# Patient Record
Sex: Male | Born: 1955 | Race: Black or African American | Hispanic: No | Marital: Single | Smoking: Current every day smoker
Health system: Southern US, Community
[De-identification: ages and names within clinical notes are randomized; demographics above are authoritative.]

## PROBLEM LIST (undated history)

## (undated) DIAGNOSIS — B182 Chronic viral hepatitis C: Secondary | ICD-10-CM

## (undated) DIAGNOSIS — F32A Depression, unspecified: Secondary | ICD-10-CM

## (undated) DIAGNOSIS — M86149 Other acute osteomyelitis, unspecified hand: Secondary | ICD-10-CM

## (undated) DIAGNOSIS — F1910 Other psychoactive substance abuse, uncomplicated: Secondary | ICD-10-CM

## (undated) DIAGNOSIS — B20 Human immunodeficiency virus [HIV] disease: Secondary | ICD-10-CM

## (undated) DIAGNOSIS — M5430 Sciatica, unspecified side: Secondary | ICD-10-CM

## (undated) DIAGNOSIS — Z653 Problems related to other legal circumstances: Secondary | ICD-10-CM

## (undated) DIAGNOSIS — F329 Major depressive disorder, single episode, unspecified: Secondary | ICD-10-CM

## (undated) DIAGNOSIS — W3400XA Accidental discharge from unspecified firearms or gun, initial encounter: Secondary | ICD-10-CM

## (undated) DIAGNOSIS — F339 Major depressive disorder, recurrent, unspecified: Secondary | ICD-10-CM

## (undated) DIAGNOSIS — N19 Unspecified kidney failure: Secondary | ICD-10-CM

## (undated) DIAGNOSIS — J449 Chronic obstructive pulmonary disease, unspecified: Secondary | ICD-10-CM

## (undated) DIAGNOSIS — R7881 Bacteremia: Secondary | ICD-10-CM

## (undated) DIAGNOSIS — F319 Bipolar disorder, unspecified: Secondary | ICD-10-CM

## (undated) DIAGNOSIS — B9562 Methicillin resistant Staphylococcus aureus infection as the cause of diseases classified elsewhere: Secondary | ICD-10-CM

## (undated) DIAGNOSIS — B1920 Unspecified viral hepatitis C without hepatic coma: Secondary | ICD-10-CM

## (undated) DIAGNOSIS — F1490 Cocaine use, unspecified, uncomplicated: Secondary | ICD-10-CM

## (undated) DIAGNOSIS — S31139A Puncture wound of abdominal wall without foreign body, unspecified quadrant without penetration into peritoneal cavity, initial encounter: Secondary | ICD-10-CM

## (undated) DIAGNOSIS — Z21 Asymptomatic human immunodeficiency virus [HIV] infection status: Secondary | ICD-10-CM

## (undated) HISTORY — DX: Unspecified kidney failure: N19

## (undated) HISTORY — DX: Bacteremia: R78.81

## (undated) HISTORY — DX: Other acute osteomyelitis, unspecified hand: M86.149

## (undated) HISTORY — DX: Methicillin resistant Staphylococcus aureus infection as the cause of diseases classified elsewhere: B95.62

## (undated) HISTORY — DX: Chronic viral hepatitis C: B18.2

## (undated) HISTORY — PX: GUNDERSON CONJUCTIVAL FLAP: SHX1717

## (undated) HISTORY — DX: Major depressive disorder, recurrent, unspecified: F33.9

## (undated) HISTORY — DX: Other psychoactive substance abuse, uncomplicated: F19.10

## (undated) HISTORY — DX: Problems related to other legal circumstances: Z65.3

## (undated) HISTORY — DX: Sciatica, unspecified side: M54.30

## (undated) HISTORY — DX: Human immunodeficiency virus (HIV) disease: B20

## (undated) HISTORY — DX: Accidental discharge from unspecified firearms or gun, initial encounter: W34.00XA

## (undated) HISTORY — DX: Puncture wound of abdominal wall without foreign body, unspecified quadrant without penetration into peritoneal cavity, initial encounter: S31.139A

## (undated) HISTORY — DX: Chronic obstructive pulmonary disease, unspecified: J44.9

---

## 1998-01-10 ENCOUNTER — Emergency Department (HOSPITAL_COMMUNITY): Admission: EM | Admit: 1998-01-10 | Discharge: 1998-01-10 | Payer: Self-pay | Admitting: Emergency Medicine

## 1998-09-23 ENCOUNTER — Emergency Department (HOSPITAL_COMMUNITY): Admission: EM | Admit: 1998-09-23 | Discharge: 1998-09-23 | Payer: Self-pay | Admitting: Emergency Medicine

## 1998-09-23 ENCOUNTER — Encounter: Payer: Self-pay | Admitting: Emergency Medicine

## 1998-09-25 ENCOUNTER — Emergency Department (HOSPITAL_COMMUNITY): Admission: EM | Admit: 1998-09-25 | Discharge: 1998-09-25 | Payer: Self-pay | Admitting: Emergency Medicine

## 1998-09-28 ENCOUNTER — Emergency Department (HOSPITAL_COMMUNITY): Admission: EM | Admit: 1998-09-28 | Discharge: 1998-09-28 | Payer: Self-pay | Admitting: Emergency Medicine

## 1998-12-23 ENCOUNTER — Encounter: Admission: RE | Admit: 1998-12-23 | Discharge: 1998-12-23 | Payer: Self-pay | Admitting: Hematology and Oncology

## 1998-12-23 ENCOUNTER — Ambulatory Visit (HOSPITAL_COMMUNITY): Admission: RE | Admit: 1998-12-23 | Discharge: 1998-12-23 | Payer: Self-pay | Admitting: Hematology and Oncology

## 1999-03-19 ENCOUNTER — Emergency Department (HOSPITAL_COMMUNITY): Admission: EM | Admit: 1999-03-19 | Discharge: 1999-03-19 | Payer: Self-pay | Admitting: Emergency Medicine

## 1999-06-18 ENCOUNTER — Encounter: Admission: RE | Admit: 1999-06-18 | Discharge: 1999-06-18 | Payer: Self-pay | Admitting: General Surgery

## 1999-06-18 ENCOUNTER — Encounter: Payer: Self-pay | Admitting: General Surgery

## 1999-06-21 ENCOUNTER — Ambulatory Visit (HOSPITAL_BASED_OUTPATIENT_CLINIC_OR_DEPARTMENT_OTHER): Admission: RE | Admit: 1999-06-21 | Discharge: 1999-06-22 | Payer: Self-pay | Admitting: General Surgery

## 1999-09-22 ENCOUNTER — Encounter: Admission: RE | Admit: 1999-09-22 | Discharge: 1999-09-22 | Payer: Self-pay | Admitting: Internal Medicine

## 1999-09-22 ENCOUNTER — Ambulatory Visit (HOSPITAL_COMMUNITY): Admission: RE | Admit: 1999-09-22 | Discharge: 1999-09-22 | Payer: Self-pay | Admitting: Internal Medicine

## 1999-10-03 ENCOUNTER — Emergency Department (HOSPITAL_COMMUNITY): Admission: EM | Admit: 1999-10-03 | Discharge: 1999-10-03 | Payer: Self-pay | Admitting: Emergency Medicine

## 2000-02-15 ENCOUNTER — Ambulatory Visit (HOSPITAL_COMMUNITY): Admission: RE | Admit: 2000-02-15 | Discharge: 2000-02-15 | Payer: Self-pay | Admitting: Hematology and Oncology

## 2000-02-15 ENCOUNTER — Encounter: Admission: RE | Admit: 2000-02-15 | Discharge: 2000-02-15 | Payer: Self-pay | Admitting: Hematology and Oncology

## 2000-03-17 ENCOUNTER — Emergency Department (HOSPITAL_COMMUNITY): Admission: EM | Admit: 2000-03-17 | Discharge: 2000-03-17 | Payer: Self-pay | Admitting: Emergency Medicine

## 2000-03-17 ENCOUNTER — Encounter: Payer: Self-pay | Admitting: Emergency Medicine

## 2000-03-27 ENCOUNTER — Encounter: Payer: Self-pay | Admitting: Emergency Medicine

## 2000-03-27 ENCOUNTER — Inpatient Hospital Stay (HOSPITAL_COMMUNITY): Admission: EM | Admit: 2000-03-27 | Discharge: 2000-04-03 | Payer: Self-pay | Admitting: Emergency Medicine

## 2000-03-27 ENCOUNTER — Encounter: Payer: Self-pay | Admitting: Orthopedic Surgery

## 2000-04-04 ENCOUNTER — Encounter (HOSPITAL_COMMUNITY): Admission: RE | Admit: 2000-04-04 | Discharge: 2000-07-03 | Payer: Self-pay | Admitting: Orthopedic Surgery

## 2000-04-17 ENCOUNTER — Emergency Department (HOSPITAL_COMMUNITY): Admission: EM | Admit: 2000-04-17 | Discharge: 2000-04-17 | Payer: Self-pay

## 2000-05-30 ENCOUNTER — Emergency Department (HOSPITAL_COMMUNITY): Admission: EM | Admit: 2000-05-30 | Discharge: 2000-05-30 | Payer: Self-pay | Admitting: Emergency Medicine

## 2000-05-30 ENCOUNTER — Inpatient Hospital Stay (HOSPITAL_COMMUNITY): Admission: EM | Admit: 2000-05-30 | Discharge: 2000-06-07 | Payer: Self-pay | Admitting: Psychiatry

## 2000-06-21 ENCOUNTER — Encounter: Admission: RE | Admit: 2000-06-21 | Discharge: 2000-06-21 | Payer: Self-pay | Admitting: Hematology and Oncology

## 2000-06-21 ENCOUNTER — Ambulatory Visit (HOSPITAL_COMMUNITY): Admission: RE | Admit: 2000-06-21 | Discharge: 2000-06-21 | Payer: Self-pay | Admitting: *Deleted

## 2000-06-21 ENCOUNTER — Encounter: Payer: Self-pay | Admitting: Internal Medicine

## 2000-06-22 ENCOUNTER — Ambulatory Visit (HOSPITAL_COMMUNITY): Admission: RE | Admit: 2000-06-22 | Discharge: 2000-06-22 | Payer: Self-pay | Admitting: Hematology and Oncology

## 2000-06-29 ENCOUNTER — Emergency Department (HOSPITAL_COMMUNITY): Admission: EM | Admit: 2000-06-29 | Discharge: 2000-06-30 | Payer: Self-pay | Admitting: Emergency Medicine

## 2000-07-11 ENCOUNTER — Emergency Department (HOSPITAL_COMMUNITY): Admission: EM | Admit: 2000-07-11 | Discharge: 2000-07-11 | Payer: Self-pay

## 2000-08-14 ENCOUNTER — Emergency Department (HOSPITAL_COMMUNITY): Admission: EM | Admit: 2000-08-14 | Discharge: 2000-08-14 | Payer: Self-pay | Admitting: Emergency Medicine

## 2000-08-15 ENCOUNTER — Encounter: Payer: Self-pay | Admitting: Emergency Medicine

## 2000-10-16 ENCOUNTER — Encounter: Admission: RE | Admit: 2000-10-16 | Discharge: 2000-10-16 | Payer: Self-pay | Admitting: Internal Medicine

## 2000-10-19 ENCOUNTER — Ambulatory Visit (HOSPITAL_COMMUNITY): Admission: RE | Admit: 2000-10-19 | Discharge: 2000-10-19 | Payer: Self-pay | Admitting: Internal Medicine

## 2000-10-19 ENCOUNTER — Encounter: Admission: RE | Admit: 2000-10-19 | Discharge: 2000-10-19 | Payer: Self-pay | Admitting: Internal Medicine

## 2000-11-02 ENCOUNTER — Encounter: Admission: RE | Admit: 2000-11-02 | Discharge: 2000-11-02 | Payer: Self-pay | Admitting: Internal Medicine

## 2000-11-29 ENCOUNTER — Encounter: Admission: RE | Admit: 2000-11-29 | Discharge: 2000-11-29 | Payer: Self-pay | Admitting: Internal Medicine

## 2001-01-02 ENCOUNTER — Encounter: Admission: RE | Admit: 2001-01-02 | Discharge: 2001-01-02 | Payer: Self-pay | Admitting: Internal Medicine

## 2001-01-02 ENCOUNTER — Ambulatory Visit (HOSPITAL_COMMUNITY): Admission: RE | Admit: 2001-01-02 | Discharge: 2001-01-02 | Payer: Self-pay | Admitting: Internal Medicine

## 2001-01-15 ENCOUNTER — Encounter: Admission: RE | Admit: 2001-01-15 | Discharge: 2001-01-15 | Payer: Self-pay | Admitting: Internal Medicine

## 2001-01-17 ENCOUNTER — Ambulatory Visit (HOSPITAL_COMMUNITY): Admission: RE | Admit: 2001-01-17 | Discharge: 2001-01-17 | Payer: Self-pay | Admitting: Internal Medicine

## 2001-01-17 ENCOUNTER — Encounter: Admission: RE | Admit: 2001-01-17 | Discharge: 2001-01-17 | Payer: Self-pay | Admitting: Internal Medicine

## 2001-02-11 ENCOUNTER — Emergency Department (HOSPITAL_COMMUNITY): Admission: EM | Admit: 2001-02-11 | Discharge: 2001-02-11 | Payer: Self-pay | Admitting: *Deleted

## 2001-02-11 ENCOUNTER — Encounter: Payer: Self-pay | Admitting: Emergency Medicine

## 2001-02-25 ENCOUNTER — Emergency Department (HOSPITAL_COMMUNITY): Admission: EM | Admit: 2001-02-25 | Discharge: 2001-02-25 | Payer: Self-pay | Admitting: Emergency Medicine

## 2001-03-12 ENCOUNTER — Ambulatory Visit (HOSPITAL_COMMUNITY): Admission: RE | Admit: 2001-03-12 | Discharge: 2001-03-12 | Payer: Self-pay

## 2001-03-12 ENCOUNTER — Encounter: Admission: RE | Admit: 2001-03-12 | Discharge: 2001-03-12 | Payer: Self-pay

## 2001-03-12 ENCOUNTER — Emergency Department (HOSPITAL_COMMUNITY): Admission: EM | Admit: 2001-03-12 | Discharge: 2001-03-12 | Payer: Self-pay | Admitting: Emergency Medicine

## 2001-04-05 ENCOUNTER — Encounter: Admission: RE | Admit: 2001-04-05 | Discharge: 2001-04-05 | Payer: Self-pay | Admitting: Internal Medicine

## 2001-07-12 ENCOUNTER — Inpatient Hospital Stay (HOSPITAL_COMMUNITY): Admission: EM | Admit: 2001-07-12 | Discharge: 2001-07-26 | Payer: Self-pay | Admitting: Psychiatry

## 2001-07-21 ENCOUNTER — Encounter: Payer: Self-pay | Admitting: Emergency Medicine

## 2001-07-21 ENCOUNTER — Emergency Department (HOSPITAL_COMMUNITY): Admission: RE | Admit: 2001-07-21 | Discharge: 2001-07-21 | Payer: Self-pay | Admitting: Psychiatry

## 2001-07-31 ENCOUNTER — Emergency Department (HOSPITAL_COMMUNITY): Admission: EM | Admit: 2001-07-31 | Discharge: 2001-07-31 | Payer: Self-pay | Admitting: Emergency Medicine

## 2001-08-21 ENCOUNTER — Emergency Department (HOSPITAL_COMMUNITY): Admission: EM | Admit: 2001-08-21 | Discharge: 2001-08-21 | Payer: Self-pay | Admitting: Emergency Medicine

## 2001-08-24 ENCOUNTER — Emergency Department (HOSPITAL_COMMUNITY): Admission: EM | Admit: 2001-08-24 | Discharge: 2001-08-24 | Payer: Self-pay | Admitting: Emergency Medicine

## 2001-08-24 ENCOUNTER — Encounter: Payer: Self-pay | Admitting: Emergency Medicine

## 2001-09-03 ENCOUNTER — Emergency Department (HOSPITAL_COMMUNITY): Admission: EM | Admit: 2001-09-03 | Discharge: 2001-09-03 | Payer: Self-pay

## 2001-09-08 ENCOUNTER — Emergency Department (HOSPITAL_COMMUNITY): Admission: EM | Admit: 2001-09-08 | Discharge: 2001-09-08 | Payer: Self-pay | Admitting: Emergency Medicine

## 2001-09-25 ENCOUNTER — Inpatient Hospital Stay (HOSPITAL_COMMUNITY): Admission: EM | Admit: 2001-09-25 | Discharge: 2001-09-28 | Payer: Self-pay | Admitting: Emergency Medicine

## 2001-10-16 ENCOUNTER — Encounter: Admission: RE | Admit: 2001-10-16 | Discharge: 2001-10-16 | Payer: Self-pay | Admitting: Internal Medicine

## 2001-10-16 ENCOUNTER — Encounter: Payer: Self-pay | Admitting: Internal Medicine

## 2001-10-16 ENCOUNTER — Ambulatory Visit (HOSPITAL_COMMUNITY): Admission: RE | Admit: 2001-10-16 | Discharge: 2001-10-16 | Payer: Self-pay | Admitting: Internal Medicine

## 2002-01-05 ENCOUNTER — Emergency Department (HOSPITAL_COMMUNITY): Admission: EM | Admit: 2002-01-05 | Discharge: 2002-01-05 | Payer: Self-pay | Admitting: *Deleted

## 2002-07-22 ENCOUNTER — Encounter: Payer: Self-pay | Admitting: Internal Medicine

## 2002-07-22 LAB — CONVERTED CEMR LAB: Hep A Total Ab: POSITIVE

## 2002-08-06 ENCOUNTER — Encounter: Payer: Self-pay | Admitting: Emergency Medicine

## 2002-08-06 ENCOUNTER — Emergency Department (HOSPITAL_COMMUNITY): Admission: EM | Admit: 2002-08-06 | Discharge: 2002-08-06 | Payer: Self-pay | Admitting: Emergency Medicine

## 2002-08-13 ENCOUNTER — Encounter: Admission: RE | Admit: 2002-08-13 | Discharge: 2002-08-13 | Payer: Self-pay | Admitting: Internal Medicine

## 2002-08-13 ENCOUNTER — Encounter: Payer: Self-pay | Admitting: Internal Medicine

## 2002-10-10 ENCOUNTER — Emergency Department (HOSPITAL_COMMUNITY): Admission: EM | Admit: 2002-10-10 | Discharge: 2002-10-10 | Payer: Self-pay | Admitting: Emergency Medicine

## 2003-08-23 ENCOUNTER — Emergency Department (HOSPITAL_COMMUNITY): Admission: EM | Admit: 2003-08-23 | Discharge: 2003-08-23 | Payer: Self-pay | Admitting: Emergency Medicine

## 2003-08-26 ENCOUNTER — Encounter: Admission: RE | Admit: 2003-08-26 | Discharge: 2003-08-26 | Payer: Self-pay | Admitting: Internal Medicine

## 2003-12-02 ENCOUNTER — Emergency Department (HOSPITAL_COMMUNITY): Admission: EM | Admit: 2003-12-02 | Discharge: 2003-12-02 | Payer: Self-pay | Admitting: Emergency Medicine

## 2004-06-26 ENCOUNTER — Emergency Department (HOSPITAL_COMMUNITY): Admission: EM | Admit: 2004-06-26 | Discharge: 2004-06-26 | Payer: Self-pay | Admitting: Emergency Medicine

## 2004-09-17 ENCOUNTER — Emergency Department (HOSPITAL_COMMUNITY): Admission: EM | Admit: 2004-09-17 | Discharge: 2004-09-18 | Payer: Self-pay | Admitting: Emergency Medicine

## 2005-01-04 ENCOUNTER — Encounter: Payer: Self-pay | Admitting: Internal Medicine

## 2005-01-04 ENCOUNTER — Ambulatory Visit (HOSPITAL_COMMUNITY): Admission: RE | Admit: 2005-01-04 | Discharge: 2005-01-04 | Payer: Self-pay | Admitting: Internal Medicine

## 2005-01-04 ENCOUNTER — Ambulatory Visit: Payer: Self-pay | Admitting: Internal Medicine

## 2006-05-02 ENCOUNTER — Emergency Department (HOSPITAL_COMMUNITY): Admission: EM | Admit: 2006-05-02 | Discharge: 2006-05-02 | Payer: Self-pay | Admitting: Emergency Medicine

## 2006-05-30 DIAGNOSIS — F319 Bipolar disorder, unspecified: Secondary | ICD-10-CM | POA: Insufficient documentation

## 2006-05-30 DIAGNOSIS — F329 Major depressive disorder, single episode, unspecified: Secondary | ICD-10-CM | POA: Insufficient documentation

## 2006-05-30 DIAGNOSIS — J45909 Unspecified asthma, uncomplicated: Secondary | ICD-10-CM | POA: Insufficient documentation

## 2006-05-30 DIAGNOSIS — J438 Other emphysema: Secondary | ICD-10-CM | POA: Insufficient documentation

## 2006-05-30 DIAGNOSIS — G44209 Tension-type headache, unspecified, not intractable: Secondary | ICD-10-CM | POA: Insufficient documentation

## 2006-05-30 DIAGNOSIS — D75A Glucose-6-phosphate dehydrogenase (G6PD) deficiency without anemia: Secondary | ICD-10-CM | POA: Insufficient documentation

## 2006-05-30 DIAGNOSIS — K625 Hemorrhage of anus and rectum: Secondary | ICD-10-CM | POA: Insufficient documentation

## 2006-05-30 DIAGNOSIS — F1420 Cocaine dependence, uncomplicated: Secondary | ICD-10-CM | POA: Insufficient documentation

## 2006-05-30 DIAGNOSIS — R079 Chest pain, unspecified: Secondary | ICD-10-CM | POA: Insufficient documentation

## 2006-05-30 DIAGNOSIS — B20 Human immunodeficiency virus [HIV] disease: Secondary | ICD-10-CM | POA: Insufficient documentation

## 2006-05-30 DIAGNOSIS — D649 Anemia, unspecified: Secondary | ICD-10-CM | POA: Insufficient documentation

## 2006-05-30 DIAGNOSIS — F1010 Alcohol abuse, uncomplicated: Secondary | ICD-10-CM | POA: Insufficient documentation

## 2006-05-30 DIAGNOSIS — I10 Essential (primary) hypertension: Secondary | ICD-10-CM | POA: Insufficient documentation

## 2006-05-30 DIAGNOSIS — B182 Chronic viral hepatitis C: Secondary | ICD-10-CM | POA: Insufficient documentation

## 2006-05-30 DIAGNOSIS — F17200 Nicotine dependence, unspecified, uncomplicated: Secondary | ICD-10-CM | POA: Insufficient documentation

## 2006-05-31 ENCOUNTER — Encounter (INDEPENDENT_AMBULATORY_CARE_PROVIDER_SITE_OTHER): Payer: Self-pay | Admitting: *Deleted

## 2006-05-31 ENCOUNTER — Ambulatory Visit: Payer: Self-pay | Admitting: Hospitalist

## 2006-05-31 ENCOUNTER — Encounter (INDEPENDENT_AMBULATORY_CARE_PROVIDER_SITE_OTHER): Payer: Self-pay | Admitting: Internal Medicine

## 2006-05-31 ENCOUNTER — Encounter: Payer: Self-pay | Admitting: Internal Medicine

## 2006-05-31 ENCOUNTER — Encounter: Admission: RE | Admit: 2006-05-31 | Discharge: 2006-05-31 | Payer: Self-pay | Admitting: Hospitalist

## 2006-05-31 LAB — CONVERTED CEMR LAB
ALT: 16 units/L (ref 0–53)
AST: 28 units/L (ref 0–37)
Albumin: 3.6 g/dL (ref 3.5–5.2)
Alkaline Phosphatase: 58 units/L (ref 39–117)
BUN: 11 mg/dL (ref 6–23)
Basophils Absolute: 0 10*3/uL (ref 0.0–0.1)
Basophils Relative: 1 % (ref 0–1)
CD4 Count: 9 microliters
CD4 T Cell Abs: 9
CO2: 27 meq/L (ref 19–32)
Calcium: 8.6 mg/dL (ref 8.4–10.5)
Chloride: 106 meq/L (ref 96–112)
Creatinine, Ser: 0.92 mg/dL (ref 0.40–1.50)
Eosinophils Relative: 11 % — ABNORMAL HIGH (ref 0–5)
Glucose, Bld: 83 mg/dL (ref 70–99)
HCT: 43.4 % (ref 39.0–52.0)
HIV 1 RNA Quant: 1150000 copies/mL
HIV 1 RNA Quant: 1150000 copies/mL — ABNORMAL HIGH (ref <50)
HIV-1 RNA Quant, Log: 6.06 — ABNORMAL HIGH (ref <1.70)
Hemoglobin: 13.7 g/dL (ref 13.0–17.0)
Lymphocytes Relative: 29 % (ref 12–46)
Lymphs Abs: 0.8 10*3/uL (ref 0.7–3.3)
MCHC: 31.6 g/dL (ref 30.0–36.0)
MCV: 92.5 fL (ref 78.0–100.0)
Monocytes Absolute: 0.3 10*3/uL (ref 0.2–0.7)
Monocytes Relative: 10 % (ref 3–11)
Neutro Abs: 1.3 10*3/uL — ABNORMAL LOW (ref 1.7–7.7)
Neutrophils Relative %: 50 % (ref 43–77)
Platelets: 117 10*3/uL — ABNORMAL LOW (ref 150–400)
Potassium: 3.5 meq/L (ref 3.5–5.3)
RBC: 4.69 M/uL (ref 4.22–5.81)
RDW: 13.2 % (ref 11.5–14.0)
Sodium: 142 meq/L (ref 135–145)
Total Bilirubin: 0.6 mg/dL (ref 0.3–1.2)
Total Protein: 9 g/dL — ABNORMAL HIGH (ref 6.0–8.3)
WBC: 2.6 10*3/uL — ABNORMAL LOW (ref 4.0–10.5)

## 2006-06-13 ENCOUNTER — Ambulatory Visit: Payer: Self-pay | Admitting: Internal Medicine

## 2006-06-13 ENCOUNTER — Encounter: Payer: Self-pay | Admitting: Internal Medicine

## 2006-06-13 LAB — CONVERTED CEMR LAB
Chlamydia, Swab/Urine, PCR: NEGATIVE
Cholesterol: 107 mg/dL (ref 0–200)
GC Probe Amp, Urine: NEGATIVE
HCV Ab: POSITIVE — AB
HDL: 24 mg/dL — ABNORMAL LOW (ref >39)
Hemoglobin, Urine: NEGATIVE
Hep B Core Total Ab: NEGATIVE
Hep B S Ab: NEGATIVE
Hepatitis B Surface Ag: NEGATIVE
LDL Cholesterol: 61 mg/dL (ref 0–99)
Leukocytes, UA: NEGATIVE
Nitrite: NEGATIVE
Protein, ur: 30 mg/dL — AB
RBC / HPF: NONE SEEN (ref <3)
Specific Gravity, Urine: 1.031 (ref 1.005–1.03)
Total CHOL/HDL Ratio: 4.5
Triglycerides: 110 mg/dL (ref <150)
Urine Glucose: NEGATIVE mg/dL
Urobilinogen, UA: 1 (ref 0.0–1.0)
VLDL: 22 mg/dL (ref 0–40)
WBC, UA: NONE SEEN cells/hpf (ref <3)
pH: 6 (ref 5.0–8.0)

## 2006-06-21 ENCOUNTER — Ambulatory Visit: Payer: Self-pay | Admitting: Internal Medicine

## 2006-07-17 ENCOUNTER — Encounter (INDEPENDENT_AMBULATORY_CARE_PROVIDER_SITE_OTHER): Payer: Self-pay | Admitting: *Deleted

## 2006-07-17 LAB — CONVERTED CEMR LAB
CD4 Count: 0 microliters
HIV 1 RNA Quant: 0 copies/mL

## 2006-07-30 ENCOUNTER — Encounter (INDEPENDENT_AMBULATORY_CARE_PROVIDER_SITE_OTHER): Payer: Self-pay | Admitting: *Deleted

## 2006-08-13 ENCOUNTER — Emergency Department (HOSPITAL_COMMUNITY): Admission: EM | Admit: 2006-08-13 | Discharge: 2006-08-13 | Payer: Self-pay | Admitting: *Deleted

## 2006-08-18 ENCOUNTER — Encounter: Payer: Self-pay | Admitting: Internal Medicine

## 2006-10-11 ENCOUNTER — Ambulatory Visit: Payer: Self-pay | Admitting: Internal Medicine

## 2006-10-11 ENCOUNTER — Encounter: Admission: RE | Admit: 2006-10-11 | Discharge: 2006-10-11 | Payer: Self-pay | Admitting: Internal Medicine

## 2006-10-11 LAB — CONVERTED CEMR LAB
ALT: 19 units/L (ref 0–53)
AST: 39 units/L — ABNORMAL HIGH (ref 0–37)
Albumin: 4 g/dL (ref 3.5–5.2)
Alkaline Phosphatase: 59 units/L (ref 39–117)
BUN: 20 mg/dL (ref 6–23)
Basophils Absolute: 0 10*3/uL (ref 0.0–0.1)
Basophils Relative: 1 % (ref 0–1)
CD4 Count: 9 microliters
CO2: 23 meq/L (ref 19–32)
Calcium: 9.6 mg/dL (ref 8.4–10.5)
Chloride: 101 meq/L (ref 96–112)
Creatinine, Ser: 1.07 mg/dL (ref 0.40–1.50)
Eosinophils Absolute: 0.6 10*3/uL (ref 0.0–0.7)
Eosinophils Relative: 20 % — ABNORMAL HIGH (ref 0–5)
Glucose, Bld: 105 mg/dL — ABNORMAL HIGH (ref 70–99)
HCT: 44.4 % (ref 39.0–52.0)
HIV 1 RNA Quant: 1560000 copies/mL — ABNORMAL HIGH (ref <50)
HIV-1 RNA Quant, Log: 6.19 — ABNORMAL HIGH (ref <1.70)
Hemoglobin: 14.6 g/dL (ref 13.0–17.0)
Lymphocytes Relative: 22 % (ref 12–46)
Lymphs Abs: 0.7 10*3/uL (ref 0.7–3.3)
MCHC: 32.9 g/dL (ref 30.0–36.0)
MCV: 92.9 fL (ref 78.0–100.0)
Monocytes Absolute: 0.3 10*3/uL (ref 0.2–0.7)
Monocytes Relative: 11 % (ref 3–11)
Neutro Abs: 1.4 10*3/uL — ABNORMAL LOW (ref 1.7–7.7)
Neutrophils Relative %: 47 % (ref 43–77)
Platelets: 117 10*3/uL — ABNORMAL LOW (ref 150–400)
Potassium: 4.1 meq/L (ref 3.5–5.3)
RBC: 4.78 M/uL (ref 4.22–5.81)
RDW: 13.7 % (ref 11.5–14.0)
Sodium: 136 meq/L (ref 135–145)
Total Bilirubin: 0.6 mg/dL (ref 0.3–1.2)
Total Protein: 10.2 g/dL — ABNORMAL HIGH (ref 6.0–8.3)
WBC: 3 10*3/uL — ABNORMAL LOW (ref 4.0–10.5)

## 2006-10-25 ENCOUNTER — Ambulatory Visit: Payer: Self-pay | Admitting: Internal Medicine

## 2006-10-25 DIAGNOSIS — B370 Candidal stomatitis: Secondary | ICD-10-CM | POA: Insufficient documentation

## 2006-10-25 DIAGNOSIS — M2550 Pain in unspecified joint: Secondary | ICD-10-CM | POA: Insufficient documentation

## 2006-11-02 ENCOUNTER — Encounter: Payer: Self-pay | Admitting: Internal Medicine

## 2006-11-06 ENCOUNTER — Encounter: Payer: Self-pay | Admitting: Internal Medicine

## 2006-11-27 ENCOUNTER — Ambulatory Visit: Payer: Self-pay | Admitting: Internal Medicine

## 2006-11-27 ENCOUNTER — Encounter: Admission: RE | Admit: 2006-11-27 | Discharge: 2006-11-27 | Payer: Self-pay | Admitting: Internal Medicine

## 2006-11-27 LAB — CONVERTED CEMR LAB
ALT: 74 units/L — ABNORMAL HIGH (ref 0–53)
AST: 85 units/L — ABNORMAL HIGH (ref 0–37)
Albumin: 3.5 g/dL (ref 3.5–5.2)
Alkaline Phosphatase: 49 units/L (ref 39–117)
BUN: 15 mg/dL (ref 6–23)
Basophils Absolute: 0 10*3/uL (ref 0.0–0.1)
Basophils Relative: 1 % (ref 0–1)
CO2: 27 meq/L (ref 19–32)
Calcium: 8.7 mg/dL (ref 8.4–10.5)
Chloride: 104 meq/L (ref 96–112)
Creatinine, Ser: 0.72 mg/dL (ref 0.40–1.50)
Eosinophils Absolute: 0.4 10*3/uL (ref 0.0–0.7)
Eosinophils Relative: 11 % — ABNORMAL HIGH (ref 0–5)
Glucose, Bld: 86 mg/dL (ref 70–99)
HCT: 43.7 % (ref 39.0–52.0)
HIV 1 RNA Quant: 842 copies/mL — ABNORMAL HIGH (ref <50)
HIV-1 RNA Quant, Log: 2.93 — ABNORMAL HIGH (ref <1.70)
Hemoglobin: 14 g/dL (ref 13.0–17.0)
Lymphocytes Relative: 28 % (ref 12–46)
Lymphs Abs: 1 10*3/uL (ref 0.7–3.3)
MCHC: 32 g/dL (ref 30.0–36.0)
MCV: 95.6 fL (ref 78.0–100.0)
Monocytes Absolute: 0.4 10*3/uL (ref 0.2–0.7)
Monocytes Relative: 11 % (ref 3–11)
Neutro Abs: 1.7 10*3/uL (ref 1.7–7.7)
Neutrophils Relative %: 49 % (ref 43–77)
Platelets: 154 10*3/uL (ref 150–400)
Potassium: 4.2 meq/L (ref 3.5–5.3)
RBC: 4.57 M/uL (ref 4.22–5.81)
RDW: 14.6 % — ABNORMAL HIGH (ref 11.5–14.0)
Sodium: 138 meq/L (ref 135–145)
Total Bilirubin: 0.5 mg/dL (ref 0.3–1.2)
Total Protein: 8.6 g/dL — ABNORMAL HIGH (ref 6.0–8.3)
WBC: 3.5 10*3/uL — ABNORMAL LOW (ref 4.0–10.5)

## 2006-11-28 ENCOUNTER — Encounter: Payer: Self-pay | Admitting: Internal Medicine

## 2006-11-28 LAB — CONVERTED CEMR LAB: CD4 Count: 90 microliters

## 2006-11-30 ENCOUNTER — Telehealth: Payer: Self-pay | Admitting: Internal Medicine

## 2006-12-12 ENCOUNTER — Encounter (INDEPENDENT_AMBULATORY_CARE_PROVIDER_SITE_OTHER): Payer: Self-pay | Admitting: *Deleted

## 2006-12-13 ENCOUNTER — Ambulatory Visit: Payer: Self-pay | Admitting: Internal Medicine

## 2006-12-22 ENCOUNTER — Encounter: Payer: Self-pay | Admitting: Internal Medicine

## 2007-01-15 ENCOUNTER — Telehealth: Payer: Self-pay | Admitting: Internal Medicine

## 2007-02-28 ENCOUNTER — Inpatient Hospital Stay (HOSPITAL_COMMUNITY): Admission: EM | Admit: 2007-02-28 | Discharge: 2007-03-05 | Payer: Self-pay | Admitting: Emergency Medicine

## 2007-03-05 ENCOUNTER — Inpatient Hospital Stay (HOSPITAL_COMMUNITY): Admission: AD | Admit: 2007-03-05 | Discharge: 2007-03-13 | Payer: Self-pay | Admitting: *Deleted

## 2007-03-05 ENCOUNTER — Ambulatory Visit: Payer: Self-pay | Admitting: *Deleted

## 2007-03-15 ENCOUNTER — Emergency Department (HOSPITAL_COMMUNITY): Admission: EM | Admit: 2007-03-15 | Discharge: 2007-03-15 | Payer: Self-pay | Admitting: Emergency Medicine

## 2007-03-28 ENCOUNTER — Emergency Department (HOSPITAL_COMMUNITY): Admission: EM | Admit: 2007-03-28 | Discharge: 2007-03-28 | Payer: Self-pay | Admitting: Emergency Medicine

## 2007-04-06 ENCOUNTER — Emergency Department (HOSPITAL_COMMUNITY): Admission: EM | Admit: 2007-04-06 | Discharge: 2007-04-06 | Payer: Self-pay | Admitting: Emergency Medicine

## 2007-07-03 ENCOUNTER — Encounter: Admission: RE | Admit: 2007-07-03 | Discharge: 2007-07-03 | Payer: Self-pay | Admitting: Internal Medicine

## 2007-07-03 ENCOUNTER — Ambulatory Visit: Payer: Self-pay | Admitting: Internal Medicine

## 2007-07-03 LAB — CONVERTED CEMR LAB
ALT: 39 units/L (ref 0–53)
AST: 74 units/L — ABNORMAL HIGH (ref 0–37)
Albumin: 3.5 g/dL (ref 3.5–5.2)
Alkaline Phosphatase: 50 units/L (ref 39–117)
BUN: 5 mg/dL — ABNORMAL LOW (ref 6–23)
Basophils Absolute: 0 10*3/uL (ref 0.0–0.1)
Basophils Relative: 0 % (ref 0–1)
CO2: 26 meq/L (ref 19–32)
Calcium: 8.4 mg/dL (ref 8.4–10.5)
Chloride: 105 meq/L (ref 96–112)
Creatinine, Ser: 0.66 mg/dL (ref 0.40–1.50)
Eosinophils Absolute: 0.2 10*3/uL (ref 0.0–0.7)
Eosinophils Relative: 7 % — ABNORMAL HIGH (ref 0–5)
Glucose, Bld: 85 mg/dL (ref 70–99)
HCT: 41.1 % (ref 39.0–52.0)
HIV 1 RNA Quant: 750000 copies/mL — ABNORMAL HIGH (ref <50)
HIV-1 RNA Quant, Log: 5.88 — ABNORMAL HIGH (ref <1.70)
Hemoglobin: 13.4 g/dL (ref 13.0–17.0)
Lymphocytes Relative: 36 % (ref 12–46)
Lymphs Abs: 0.9 10*3/uL (ref 0.7–4.0)
MCHC: 32.6 g/dL (ref 30.0–36.0)
MCV: 90.9 fL (ref 78.0–100.0)
Monocytes Absolute: 0.2 10*3/uL (ref 0.1–1.0)
Monocytes Relative: 9 % (ref 3–12)
Neutro Abs: 1.1 10*3/uL — ABNORMAL LOW (ref 1.7–7.7)
Neutrophils Relative %: 48 % (ref 43–77)
Platelets: 141 10*3/uL — ABNORMAL LOW (ref 150–400)
Potassium: 3.7 meq/L (ref 3.5–5.3)
RBC: 4.52 M/uL (ref 4.22–5.81)
RDW: 13.1 % (ref 11.5–15.5)
Sodium: 140 meq/L (ref 135–145)
Total Bilirubin: 0.7 mg/dL (ref 0.3–1.2)
Total Protein: 8.2 g/dL (ref 6.0–8.3)
WBC: 2.4 10*3/uL — ABNORMAL LOW (ref 4.0–10.5)

## 2007-07-18 ENCOUNTER — Ambulatory Visit: Payer: Self-pay | Admitting: Internal Medicine

## 2007-07-18 DIAGNOSIS — G5790 Unspecified mononeuropathy of unspecified lower limb: Secondary | ICD-10-CM | POA: Insufficient documentation

## 2007-08-02 ENCOUNTER — Encounter: Payer: Self-pay | Admitting: Emergency Medicine

## 2007-08-02 ENCOUNTER — Ambulatory Visit: Payer: Self-pay | Admitting: Psychiatry

## 2007-08-02 ENCOUNTER — Inpatient Hospital Stay (HOSPITAL_COMMUNITY): Admission: RE | Admit: 2007-08-02 | Discharge: 2007-08-08 | Payer: Self-pay | Admitting: Psychiatry

## 2008-01-11 ENCOUNTER — Encounter: Payer: Self-pay | Admitting: Internal Medicine

## 2008-02-12 ENCOUNTER — Encounter: Payer: Self-pay | Admitting: Internal Medicine

## 2008-05-19 ENCOUNTER — Inpatient Hospital Stay (HOSPITAL_COMMUNITY): Admission: EM | Admit: 2008-05-19 | Discharge: 2008-05-24 | Payer: Self-pay | Admitting: Emergency Medicine

## 2008-05-20 ENCOUNTER — Ambulatory Visit: Payer: Self-pay | Admitting: Gastroenterology

## 2008-05-21 ENCOUNTER — Ambulatory Visit: Payer: Self-pay | Admitting: Oncology

## 2008-05-21 ENCOUNTER — Ambulatory Visit: Payer: Self-pay | Admitting: Infectious Disease

## 2008-05-21 ENCOUNTER — Encounter: Payer: Self-pay | Admitting: Gastroenterology

## 2008-06-16 ENCOUNTER — Ambulatory Visit: Payer: Self-pay | Admitting: Internal Medicine

## 2008-06-16 ENCOUNTER — Encounter (INDEPENDENT_AMBULATORY_CARE_PROVIDER_SITE_OTHER): Payer: Self-pay | Admitting: Licensed Clinical Social Worker

## 2008-06-16 LAB — CONVERTED CEMR LAB
ALT: 14 units/L (ref 0–53)
AST: 30 units/L (ref 0–37)
Albumin: 3.2 g/dL — ABNORMAL LOW (ref 3.5–5.2)
Alkaline Phosphatase: 56 units/L (ref 39–117)
BUN: 9 mg/dL (ref 6–23)
Basophils Absolute: 0 10*3/uL (ref 0.0–0.1)
Basophils Relative: 0 % (ref 0–1)
CO2: 25 meq/L (ref 19–32)
Calcium: 8.6 mg/dL (ref 8.4–10.5)
Chloride: 105 meq/L (ref 96–112)
Cholesterol: 103 mg/dL (ref 0–200)
Creatinine, Ser: 0.78 mg/dL (ref 0.40–1.50)
Eosinophils Absolute: 0 10*3/uL (ref 0.0–0.7)
Eosinophils Relative: 2 % (ref 0–5)
Glucose, Bld: 92 mg/dL (ref 70–99)
HCT: 38 % — ABNORMAL LOW (ref 39.0–52.0)
HDL: 30 mg/dL — ABNORMAL LOW (ref >39)
HIV 1 RNA Quant: 2260000 copies/mL — ABNORMAL HIGH (ref <48)
HIV-1 RNA Quant, Log: 6.35 — ABNORMAL HIGH (ref <1.68)
Hemoglobin: 11.7 g/dL — ABNORMAL LOW (ref 13.0–17.0)
LDL Cholesterol: 58 mg/dL (ref 0–99)
Lymphocytes Relative: 35 % (ref 12–46)
Lymphs Abs: 0.6 10*3/uL — ABNORMAL LOW (ref 0.7–4.0)
MCHC: 30.8 g/dL (ref 30.0–36.0)
MCV: 101.6 fL — ABNORMAL HIGH (ref 78.0–100.0)
Monocytes Absolute: 0.2 10*3/uL (ref 0.1–1.0)
Monocytes Relative: 11 % (ref 3–12)
Neutro Abs: 0.9 10*3/uL — ABNORMAL LOW (ref 1.7–7.7)
Neutrophils Relative %: 52 % (ref 43–77)
Platelets: 220 10*3/uL (ref 150–400)
Potassium: 3.7 meq/L (ref 3.5–5.3)
RBC: 3.74 M/uL — ABNORMAL LOW (ref 4.22–5.81)
RDW: 18.8 % — ABNORMAL HIGH (ref 11.5–15.5)
Sodium: 145 meq/L (ref 135–145)
Total Bilirubin: 0.6 mg/dL (ref 0.3–1.2)
Total CHOL/HDL Ratio: 3.4
Total Protein: 8.2 g/dL (ref 6.0–8.3)
Triglycerides: 75 mg/dL (ref <150)
VLDL: 15 mg/dL (ref 0–40)
WBC: 1.7 10*3/uL — ABNORMAL LOW (ref 4.0–10.5)

## 2008-07-08 ENCOUNTER — Ambulatory Visit: Payer: Self-pay | Admitting: Internal Medicine

## 2008-07-09 ENCOUNTER — Encounter: Payer: Self-pay | Admitting: Internal Medicine

## 2008-07-11 ENCOUNTER — Encounter (INDEPENDENT_AMBULATORY_CARE_PROVIDER_SITE_OTHER): Payer: Self-pay | Admitting: Licensed Clinical Social Worker

## 2008-07-15 ENCOUNTER — Ambulatory Visit: Payer: Self-pay | Admitting: Internal Medicine

## 2008-07-15 ENCOUNTER — Encounter: Payer: Self-pay | Admitting: Emergency Medicine

## 2008-07-15 ENCOUNTER — Inpatient Hospital Stay (HOSPITAL_COMMUNITY): Admission: EM | Admit: 2008-07-15 | Discharge: 2008-07-19 | Payer: Self-pay

## 2008-07-19 ENCOUNTER — Inpatient Hospital Stay (HOSPITAL_COMMUNITY): Admission: EM | Admit: 2008-07-19 | Discharge: 2008-07-24 | Payer: Self-pay

## 2008-07-19 ENCOUNTER — Encounter: Payer: Self-pay | Admitting: Emergency Medicine

## 2008-07-20 ENCOUNTER — Ambulatory Visit: Payer: Self-pay | Admitting: Psychiatry

## 2008-07-25 ENCOUNTER — Encounter: Payer: Self-pay | Admitting: Internal Medicine

## 2008-07-25 DIAGNOSIS — E871 Hypo-osmolality and hyponatremia: Secondary | ICD-10-CM | POA: Insufficient documentation

## 2008-07-30 ENCOUNTER — Inpatient Hospital Stay (HOSPITAL_COMMUNITY): Admission: AD | Admit: 2008-07-30 | Discharge: 2008-08-05 | Payer: Self-pay | Admitting: Psychiatry

## 2008-07-30 ENCOUNTER — Encounter: Payer: Self-pay | Admitting: *Deleted

## 2008-07-30 ENCOUNTER — Encounter: Payer: Self-pay | Admitting: Emergency Medicine

## 2008-08-27 ENCOUNTER — Encounter: Payer: Self-pay | Admitting: Internal Medicine

## 2008-08-27 ENCOUNTER — Telehealth: Payer: Self-pay | Admitting: Internal Medicine

## 2008-08-28 ENCOUNTER — Ambulatory Visit: Payer: Self-pay | Admitting: Internal Medicine

## 2008-08-28 ENCOUNTER — Encounter (INDEPENDENT_AMBULATORY_CARE_PROVIDER_SITE_OTHER): Payer: Self-pay | Admitting: Licensed Clinical Social Worker

## 2008-08-28 LAB — CONVERTED CEMR LAB
ALT: 42 units/L (ref 0–53)
AST: 60 units/L — ABNORMAL HIGH (ref 0–37)
Albumin: 3.6 g/dL (ref 3.5–5.2)
Alkaline Phosphatase: 44 units/L (ref 39–117)
BUN: 19 mg/dL (ref 6–23)
Basophils Absolute: 0 10*3/uL (ref 0.0–0.1)
Basophils Relative: 0 % (ref 0–1)
CO2: 24 meq/L (ref 19–32)
Calcium: 9.3 mg/dL (ref 8.4–10.5)
Chloride: 109 meq/L (ref 96–112)
Creatinine, Ser: 1.02 mg/dL (ref 0.40–1.50)
Eosinophils Absolute: 0.1 10*3/uL (ref 0.0–0.7)
Eosinophils Relative: 1 % (ref 0–5)
GFR calc Af Amer: >60 mL/min (ref >60)
GFR calc non Af Amer: >60 mL/min (ref >60)
Glucose, Bld: 65 mg/dL — ABNORMAL LOW (ref 70–99)
HCT: 39 % (ref 39.0–52.0)
HIV 1 RNA Quant: 106000 copies/mL — ABNORMAL HIGH (ref <48)
HIV-1 RNA Quant, Log: 5.03 — ABNORMAL HIGH (ref <1.68)
Hemoglobin: 12 g/dL — ABNORMAL LOW (ref 13.0–17.0)
Lymphocytes Relative: 45 % (ref 12–46)
Lymphs Abs: 1.8 10*3/uL (ref 0.7–4.0)
MCHC: 30.8 g/dL (ref 30.0–36.0)
MCV: 91.1 fL (ref 78.0–100.0)
Monocytes Absolute: 0.3 10*3/uL (ref 0.1–1.0)
Monocytes Relative: 9 % (ref 3–12)
Neutro Abs: 1.8 10*3/uL (ref 1.7–7.7)
Neutrophils Relative %: 45 % (ref 43–77)
Platelets: 173 10*3/uL (ref 150–400)
Potassium: 4.5 meq/L (ref 3.5–5.3)
RBC: 4.28 M/uL (ref 4.22–5.81)
RDW: 15.6 % — ABNORMAL HIGH (ref 11.5–15.5)
Sodium: 143 meq/L (ref 135–145)
Total Bilirubin: 0.5 mg/dL (ref 0.3–1.2)
Total Protein: 8.6 g/dL — ABNORMAL HIGH (ref 6.0–8.3)
WBC: 4 10*3/uL (ref 4.0–10.5)

## 2008-09-30 ENCOUNTER — Emergency Department (HOSPITAL_COMMUNITY): Admission: EM | Admit: 2008-09-30 | Discharge: 2008-09-30 | Payer: Self-pay | Admitting: Emergency Medicine

## 2009-02-20 ENCOUNTER — Encounter: Payer: Self-pay | Admitting: Internal Medicine

## 2009-03-08 ENCOUNTER — Emergency Department (HOSPITAL_COMMUNITY): Admission: EM | Admit: 2009-03-08 | Discharge: 2009-03-08 | Payer: Self-pay | Admitting: Emergency Medicine

## 2009-03-12 ENCOUNTER — Encounter: Payer: Self-pay | Admitting: Internal Medicine

## 2009-03-30 ENCOUNTER — Emergency Department (HOSPITAL_COMMUNITY): Admission: EM | Admit: 2009-03-30 | Discharge: 2009-03-31 | Payer: Self-pay | Admitting: Emergency Medicine

## 2009-04-28 ENCOUNTER — Ambulatory Visit: Payer: Self-pay | Admitting: Internal Medicine

## 2009-04-28 LAB — CONVERTED CEMR LAB
ALT: 36 units/L (ref 0–53)
AST: 65 units/L — ABNORMAL HIGH (ref 0–37)
Albumin: 3.6 g/dL (ref 3.5–5.2)
Alkaline Phosphatase: 52 units/L (ref 39–117)
BUN: 7 mg/dL (ref 6–23)
Basophils Absolute: 0 10*3/uL (ref 0.0–0.1)
Basophils Relative: 0 % (ref 0–1)
CO2: 23 meq/L (ref 19–32)
Calcium: 8.6 mg/dL (ref 8.4–10.5)
Chlamydia, Swab/Urine, PCR: NEGATIVE
Chloride: 106 meq/L (ref 96–112)
Cholesterol: 108 mg/dL (ref 0–200)
Creatinine, Ser: 0.8 mg/dL (ref 0.40–1.50)
Eosinophils Absolute: 0.1 10*3/uL (ref 0.0–0.7)
Eosinophils Relative: 3 % (ref 0–5)
GC Probe Amp, Urine: NEGATIVE
Glucose, Bld: 104 mg/dL — ABNORMAL HIGH (ref 70–99)
HCT: 39 % (ref 39.0–52.0)
HDL: 32 mg/dL — ABNORMAL LOW (ref >39)
HIV 1 RNA Quant: 1510000 copies/mL — ABNORMAL HIGH (ref <48)
HIV-1 RNA Quant, Log: 6.18 — ABNORMAL HIGH (ref <1.68)
Hemoglobin: 13.2 g/dL (ref 13.0–17.0)
LDL Cholesterol: 62 mg/dL (ref 0–99)
Lymphocytes Relative: 37 % (ref 12–46)
Lymphs Abs: 0.9 10*3/uL (ref 0.7–4.0)
MCHC: 33.8 g/dL (ref 30.0–36.0)
MCV: 96.8 fL (ref >=78.0)
Monocytes Absolute: 0.3 10*3/uL (ref 0.1–1.0)
Monocytes Relative: 12 % (ref 3–12)
Neutro Abs: 1.1 10*3/uL — ABNORMAL LOW (ref 1.7–7.7)
Neutrophils Relative %: 48 % (ref 43–77)
Platelets: 149 10*3/uL — ABNORMAL LOW (ref 150–400)
Potassium: 3.4 meq/L — ABNORMAL LOW (ref 3.5–5.3)
RBC: 4.03 M/uL — ABNORMAL LOW (ref 4.22–5.81)
RDW: 12.7 % (ref 11.5–15.5)
Sodium: 141 meq/L (ref 135–145)
Total Bilirubin: 0.7 mg/dL (ref 0.3–1.2)
Total CHOL/HDL Ratio: 3.4
Total Protein: 8.3 g/dL (ref 6.0–8.3)
Triglycerides: 70 mg/dL (ref <150)
VLDL: 14 mg/dL (ref 0–40)
WBC: 2.3 10*3/uL — ABNORMAL LOW (ref 4.0–10.5)

## 2009-05-05 ENCOUNTER — Ambulatory Visit: Payer: Self-pay | Admitting: Internal Medicine

## 2009-06-06 ENCOUNTER — Emergency Department (HOSPITAL_COMMUNITY): Admission: EM | Admit: 2009-06-06 | Discharge: 2009-06-07 | Payer: Self-pay | Admitting: Emergency Medicine

## 2009-06-16 ENCOUNTER — Emergency Department (HOSPITAL_COMMUNITY): Admission: EM | Admit: 2009-06-16 | Discharge: 2009-06-16 | Payer: Self-pay | Admitting: Emergency Medicine

## 2009-06-16 ENCOUNTER — Ambulatory Visit (HOSPITAL_COMMUNITY): Admission: RE | Admit: 2009-06-16 | Discharge: 2009-06-16 | Payer: Self-pay | Admitting: Psychiatry

## 2009-06-24 ENCOUNTER — Telehealth: Payer: Self-pay | Admitting: Internal Medicine

## 2009-07-30 ENCOUNTER — Emergency Department (HOSPITAL_COMMUNITY): Admission: EM | Admit: 2009-07-30 | Discharge: 2009-07-30 | Payer: Self-pay | Admitting: Emergency Medicine

## 2009-09-25 ENCOUNTER — Encounter: Payer: Self-pay | Admitting: Internal Medicine

## 2010-06-22 NOTE — Miscellaneous (Signed)
Summary: [facility]  [facility]   Imported By: Florinda Marker 09/25/2009 16:23:48  _____________________________________________________________________  External Attachment:    Type:   Image     Comment:   External Document

## 2010-06-22 NOTE — Progress Notes (Signed)
Summary: phone note-ty  Phone Note Outgoing Call   Summary of Call: Patient did not show up in court, and he has a warrant out for his arrest. He is currently looking at a lenghty sentence due to not showing up to court. Initial call taken by: Starleen Arms CMA,  June 24, 2009 1:13 PM

## 2010-08-08 LAB — DIFFERENTIAL
Basophils Absolute: 0 10*3/uL (ref 0.0–0.1)
Basophils Absolute: 0 10*3/uL (ref 0.0–0.1)
Basophils Relative: 0 % (ref 0–1)
Basophils Relative: 1 % (ref 0–1)
Eosinophils Absolute: 0.1 10*3/uL (ref 0.0–0.7)
Eosinophils Absolute: 0.1 10*3/uL (ref 0.0–0.7)
Eosinophils Relative: 2 % (ref 0–5)
Eosinophils Relative: 3 % (ref 0–5)
Lymphocytes Relative: 25 % (ref 12–46)
Lymphocytes Relative: 27 % (ref 12–46)
Lymphs Abs: 1 10*3/uL (ref 0.7–4.0)
Lymphs Abs: 1.1 10*3/uL (ref 0.7–4.0)
Monocytes Absolute: 0.3 10*3/uL (ref 0.1–1.0)
Monocytes Absolute: 0.3 10*3/uL (ref 0.1–1.0)
Monocytes Relative: 10 % (ref 3–12)
Monocytes Relative: 7 % (ref 3–12)
Neutro Abs: 2.2 10*3/uL (ref 1.7–7.7)
Neutro Abs: 3 10*3/uL (ref 1.7–7.7)
Neutrophils Relative %: 59 % (ref 43–77)
Neutrophils Relative %: 66 % (ref 43–77)

## 2010-08-08 LAB — CBC
HCT: 36.5 % — ABNORMAL LOW (ref 39.0–52.0)
HCT: 38.2 % — ABNORMAL LOW (ref 39.0–52.0)
Hemoglobin: 12.1 g/dL — ABNORMAL LOW (ref 13.0–17.0)
Hemoglobin: 12.5 g/dL — ABNORMAL LOW (ref 13.0–17.0)
MCHC: 32.7 g/dL (ref 30.0–36.0)
MCHC: 33.2 g/dL (ref 30.0–36.0)
MCV: 97 fL (ref 78.0–100.0)
MCV: 97.6 fL (ref 78.0–100.0)
Platelets: 128 10*3/uL — ABNORMAL LOW (ref 150–400)
Platelets: 163 10*3/uL (ref 150–400)
RBC: 3.76 MIL/uL — ABNORMAL LOW (ref 4.22–5.81)
RBC: 3.92 MIL/uL — ABNORMAL LOW (ref 4.22–5.81)
RDW: 13.1 % (ref 11.5–15.5)
RDW: 13.3 % (ref 11.5–15.5)
WBC: 3.6 10*3/uL — ABNORMAL LOW (ref 4.0–10.5)
WBC: 4.6 10*3/uL (ref 4.0–10.5)

## 2010-08-08 LAB — URINALYSIS, ROUTINE W REFLEX MICROSCOPIC
Glucose, UA: NEGATIVE mg/dL
Hgb urine dipstick: NEGATIVE
Nitrite: NEGATIVE
Protein, ur: NEGATIVE mg/dL
Specific Gravity, Urine: 1.024 (ref 1.005–1.030)
Urobilinogen, UA: 1 mg/dL (ref 0.0–1.0)
pH: 6 (ref 5.0–8.0)

## 2010-08-08 LAB — BASIC METABOLIC PANEL
BUN: 7 mg/dL (ref 6–23)
CO2: 26 mEq/L (ref 19–32)
Calcium: 9 mg/dL (ref 8.4–10.5)
Chloride: 104 mEq/L (ref 96–112)
Creatinine, Ser: 0.88 mg/dL (ref 0.4–1.5)
GFR calc Af Amer: >60 mL/min (ref >60)
GFR calc non Af Amer: >60 mL/min (ref >60)
Glucose, Bld: 93 mg/dL (ref 70–99)
Potassium: 3.4 mEq/L — ABNORMAL LOW (ref 3.5–5.1)
Sodium: 136 mEq/L (ref 135–145)

## 2010-08-08 LAB — RAPID URINE DRUG SCREEN, HOSP PERFORMED
Amphetamines: NOT DETECTED
Amphetamines: NOT DETECTED
Barbiturates: NOT DETECTED
Barbiturates: NOT DETECTED
Benzodiazepines: NOT DETECTED
Benzodiazepines: NOT DETECTED
Cocaine: POSITIVE — AB
Cocaine: POSITIVE — AB
Opiates: NOT DETECTED
Opiates: NOT DETECTED
Tetrahydrocannabinol: POSITIVE — AB
Tetrahydrocannabinol: POSITIVE — AB

## 2010-08-08 LAB — ETHANOL
Alcohol, Ethyl (B): 102 mg/dL — ABNORMAL HIGH (ref 0–10)
Alcohol, Ethyl (B): 103 mg/dL — ABNORMAL HIGH (ref 0–10)

## 2010-08-15 LAB — BASIC METABOLIC PANEL
BUN: 7 mg/dL (ref 6–23)
CO2: 26 mEq/L (ref 19–32)
Calcium: 8.3 mg/dL — ABNORMAL LOW (ref 8.4–10.5)
Chloride: 106 mEq/L (ref 96–112)
Creatinine, Ser: 1.24 mg/dL (ref 0.4–1.5)
GFR calc Af Amer: >60 mL/min (ref >60)
GFR calc non Af Amer: >60 mL/min (ref >60)
Glucose, Bld: 100 mg/dL — ABNORMAL HIGH (ref 70–99)
Potassium: 3.4 mEq/L — ABNORMAL LOW (ref 3.5–5.1)
Sodium: 138 mEq/L (ref 135–145)

## 2010-08-15 LAB — URINALYSIS, ROUTINE W REFLEX MICROSCOPIC
Glucose, UA: NEGATIVE mg/dL
Hgb urine dipstick: NEGATIVE
Ketones, ur: 15 mg/dL — AB
Leukocytes, UA: NEGATIVE
Nitrite: NEGATIVE
Protein, ur: 30 mg/dL — AB
Specific Gravity, Urine: 1.014 (ref 1.005–1.030)
Urobilinogen, UA: 1 mg/dL (ref 0.0–1.0)
pH: 5.5 (ref 5.0–8.0)

## 2010-08-15 LAB — ETHANOL: Alcohol, Ethyl (B): 259 mg/dL — ABNORMAL HIGH (ref 0–10)

## 2010-08-15 LAB — DIFFERENTIAL
Basophils Absolute: 0 10*3/uL (ref 0.0–0.1)
Basophils Relative: 0 % (ref 0–1)
Eosinophils Absolute: 0.1 10*3/uL (ref 0.0–0.7)
Eosinophils Relative: 2 % (ref 0–5)
Lymphocytes Relative: 43 % (ref 12–46)
Lymphs Abs: 1.5 10*3/uL (ref 0.7–4.0)
Monocytes Absolute: 0.4 10*3/uL (ref 0.1–1.0)
Monocytes Relative: 12 % (ref 3–12)
Neutro Abs: 1.5 10*3/uL — ABNORMAL LOW (ref 1.7–7.7)
Neutrophils Relative %: 43 % (ref 43–77)

## 2010-08-15 LAB — URINE MICROSCOPIC-ADD ON

## 2010-08-15 LAB — PROTIME-INR
INR: 1.2 (ref 0.00–1.49)
Prothrombin Time: 15.1 seconds (ref 11.6–15.2)

## 2010-08-15 LAB — CBC
HCT: 38.8 % — ABNORMAL LOW (ref 39.0–52.0)
Hemoglobin: 13 g/dL (ref 13.0–17.0)
MCHC: 33.4 g/dL (ref 30.0–36.0)
MCV: 96.2 fL (ref 78.0–100.0)
Platelets: 121 10*3/uL — ABNORMAL LOW (ref 150–400)
RBC: 4.03 MIL/uL — ABNORMAL LOW (ref 4.22–5.81)
RDW: 13.7 % (ref 11.5–15.5)
WBC: 3.5 10*3/uL — ABNORMAL LOW (ref 4.0–10.5)

## 2010-08-15 LAB — RAPID URINE DRUG SCREEN, HOSP PERFORMED
Amphetamines: NOT DETECTED
Barbiturates: NOT DETECTED
Benzodiazepines: NOT DETECTED
Cocaine: NOT DETECTED
Opiates: NOT DETECTED
Tetrahydrocannabinol: POSITIVE — AB

## 2010-08-15 LAB — APTT: aPTT: 34 seconds (ref 24–37)

## 2010-08-24 LAB — T-HELPER CELL (CD4) - (RCID CLINIC ONLY)
CD4 % Helper T Cell: <1 % — ABNORMAL LOW (ref 33–55)
CD4 T Cell Abs: <10 uL — ABNORMAL LOW (ref 400–2700)

## 2010-08-26 LAB — CBC
HCT: 32.1 % — ABNORMAL LOW (ref 39.0–52.0)
Hemoglobin: 10.5 g/dL — ABNORMAL LOW (ref 13.0–17.0)
MCHC: 32.7 g/dL (ref 30.0–36.0)
MCV: 106 fL — ABNORMAL HIGH (ref 78.0–100.0)
Platelets: 177 10*3/uL (ref 150–400)
RBC: 3.03 MIL/uL — ABNORMAL LOW (ref 4.22–5.81)
RDW: 20.8 % — ABNORMAL HIGH (ref 11.5–15.5)
WBC: 3 10*3/uL — ABNORMAL LOW (ref 4.0–10.5)

## 2010-08-26 LAB — DIFFERENTIAL
Basophils Absolute: 0 10*3/uL (ref 0.0–0.1)
Basophils Relative: 1 % (ref 0–1)
Eosinophils Absolute: 0.2 10*3/uL (ref 0.0–0.7)
Eosinophils Relative: 6 % — ABNORMAL HIGH (ref 0–5)
Lymphocytes Relative: 16 % (ref 12–46)
Lymphs Abs: 0.5 10*3/uL — ABNORMAL LOW (ref 0.7–4.0)
Monocytes Absolute: 0.3 10*3/uL (ref 0.1–1.0)
Monocytes Relative: 11 % (ref 3–12)
Neutro Abs: 2 10*3/uL (ref 1.7–7.7)
Neutrophils Relative %: 67 % (ref 43–77)

## 2010-08-26 LAB — COMPREHENSIVE METABOLIC PANEL
ALT: 144 U/L — ABNORMAL HIGH (ref 0–53)
AST: 299 U/L — ABNORMAL HIGH (ref 0–37)
Albumin: 3.1 g/dL — ABNORMAL LOW (ref 3.5–5.2)
Alkaline Phosphatase: 48 U/L (ref 39–117)
BUN: 4 mg/dL — ABNORMAL LOW (ref 6–23)
CO2: 28 mEq/L (ref 19–32)
Calcium: 8.2 mg/dL — ABNORMAL LOW (ref 8.4–10.5)
Chloride: 101 mEq/L (ref 96–112)
Creatinine, Ser: 0.61 mg/dL (ref 0.4–1.5)
GFR calc Af Amer: >60 mL/min (ref >60)
GFR calc non Af Amer: >60 mL/min (ref >60)
Glucose, Bld: 86 mg/dL (ref 70–99)
Potassium: 3 mEq/L — ABNORMAL LOW (ref 3.5–5.1)
Sodium: 136 mEq/L (ref 135–145)
Total Bilirubin: 0.7 mg/dL (ref 0.3–1.2)
Total Protein: 7.6 g/dL (ref 6.0–8.3)

## 2010-08-26 LAB — LIPASE, BLOOD: Lipase: 21 U/L (ref 11–59)

## 2010-08-26 LAB — ETHANOL: Alcohol, Ethyl (B): 266 mg/dL — ABNORMAL HIGH (ref 0–10)

## 2010-09-01 LAB — T-HELPER CELL (CD4) - (RCID CLINIC ONLY)
CD4 % Helper T Cell: 2 % — ABNORMAL LOW (ref 33–55)
CD4 T Cell Abs: 30 uL — ABNORMAL LOW (ref 400–2700)

## 2010-09-02 LAB — BASIC METABOLIC PANEL
BUN: 5 mg/dL — ABNORMAL LOW (ref 6–23)
BUN: 5 mg/dL — ABNORMAL LOW (ref 6–23)
BUN: 6 mg/dL (ref 6–23)
BUN: 8 mg/dL (ref 6–23)
CO2: 25 mEq/L (ref 19–32)
CO2: 26 mEq/L (ref 19–32)
CO2: 27 mEq/L (ref 19–32)
CO2: 23 mEq/L (ref 19–32)
Calcium: 8.7 mg/dL (ref 8.4–10.5)
Calcium: 9.2 mg/dL (ref 8.4–10.5)
Calcium: 9.2 mg/dL (ref 8.4–10.5)
Calcium: 9.6 mg/dL (ref 8.4–10.5)
Chloride: 89 mEq/L — ABNORMAL LOW (ref 96–112)
Chloride: 90 mEq/L — ABNORMAL LOW (ref 96–112)
Chloride: 91 mEq/L — ABNORMAL LOW (ref 96–112)
Chloride: 107 mEq/L (ref 96–112)
Creatinine, Ser: 0.67 mg/dL (ref 0.4–1.5)
Creatinine, Ser: 0.73 mg/dL (ref 0.4–1.5)
Creatinine, Ser: 0.73 mg/dL (ref 0.4–1.5)
Creatinine, Ser: 0.76 mg/dL (ref 0.4–1.5)
GFR calc Af Amer: >60 mL/min (ref >60)
GFR calc Af Amer: >60 mL/min (ref >60)
GFR calc Af Amer: >60 mL/min (ref >60)
GFR calc Af Amer: >60 mL/min (ref >60)
GFR calc non Af Amer: >60 mL/min (ref >60)
GFR calc non Af Amer: >60 mL/min (ref >60)
GFR calc non Af Amer: >60 mL/min (ref >60)
GFR calc non Af Amer: >60 mL/min (ref >60)
Glucose, Bld: 94 mg/dL (ref 70–99)
Glucose, Bld: 95 mg/dL (ref 70–99)
Glucose, Bld: 96 mg/dL (ref 70–99)
Glucose, Bld: 95 mg/dL (ref 70–99)
Potassium: 3.7 mEq/L (ref 3.5–5.1)
Potassium: 3.8 mEq/L (ref 3.5–5.1)
Potassium: 3.9 mEq/L (ref 3.5–5.1)
Potassium: 4.2 mEq/L (ref 3.5–5.1)
Sodium: 121 mEq/L — ABNORMAL LOW (ref 135–145)
Sodium: 123 mEq/L — ABNORMAL LOW (ref 135–145)
Sodium: 124 mEq/L — ABNORMAL LOW (ref 135–145)
Sodium: 136 mEq/L (ref 135–145)

## 2010-09-02 LAB — RPR: RPR Ser Ql: NONREACTIVE

## 2010-09-02 LAB — RAPID URINE DRUG SCREEN, HOSP PERFORMED
Amphetamines: NOT DETECTED
Barbiturates: NOT DETECTED
Benzodiazepines: NOT DETECTED
Cocaine: NOT DETECTED
Opiates: NOT DETECTED
Tetrahydrocannabinol: NOT DETECTED

## 2010-09-02 LAB — CREATININE, URINE, RANDOM: Creatinine, Urine: 66.9 mg/dL

## 2010-09-02 LAB — CBC
HCT: 41 % (ref 39.0–52.0)
HCT: 38.6 % — ABNORMAL LOW (ref 39.0–52.0)
Hemoglobin: 13.8 g/dL (ref 13.0–17.0)
Hemoglobin: 12.7 g/dL — ABNORMAL LOW (ref 13.0–17.0)
MCHC: 33.7 g/dL (ref 30.0–36.0)
MCHC: 32.9 g/dL (ref 30.0–36.0)
MCV: 93.1 fL (ref 78.0–100.0)
MCV: 92.8 fL (ref 78.0–100.0)
Platelets: 157 10*3/uL (ref 150–400)
Platelets: 240 10*3/uL (ref 150–400)
RBC: 4.4 MIL/uL (ref 4.22–5.81)
RBC: 4.16 MIL/uL — ABNORMAL LOW (ref 4.22–5.81)
RDW: 15 % (ref 11.5–15.5)
RDW: 15 % (ref 11.5–15.5)
WBC: 4.4 10*3/uL (ref 4.0–10.5)
WBC: 4.6 10*3/uL (ref 4.0–10.5)

## 2010-09-02 LAB — DIFFERENTIAL
Basophils Absolute: 0 10*3/uL (ref 0.0–0.1)
Basophils Relative: 1 % (ref 0–1)
Eosinophils Absolute: 0.1 10*3/uL (ref 0.0–0.7)
Eosinophils Relative: 3 % (ref 0–5)
Lymphocytes Relative: 9 % — ABNORMAL LOW (ref 12–46)
Lymphs Abs: 0.4 10*3/uL — ABNORMAL LOW (ref 0.7–4.0)
Monocytes Absolute: 0.3 10*3/uL (ref 0.1–1.0)
Monocytes Relative: 7 % (ref 3–12)
Neutro Abs: 3.7 10*3/uL (ref 1.7–7.7)
Neutrophils Relative %: 81 % — ABNORMAL HIGH (ref 43–77)

## 2010-09-02 LAB — TSH: TSH: 2.063 u[IU]/mL (ref 0.350–4.500)

## 2010-09-02 LAB — OSMOLALITY: Osmolality: 248 mOsm/kg — ABNORMAL LOW (ref 275–300)

## 2010-09-02 LAB — SODIUM, URINE, RANDOM: Sodium, Ur: 160 mEq/L

## 2010-09-02 LAB — URIC ACID: Uric Acid, Serum: 2.9 mg/dL — ABNORMAL LOW (ref 4.0–7.8)

## 2010-09-02 LAB — OSMOLALITY, URINE: Osmolality, Ur: 416 mOsm/kg (ref 390–1090)

## 2010-09-02 LAB — ETHANOL: Alcohol, Ethyl (B): 75 mg/dL — ABNORMAL HIGH (ref 0–10)

## 2010-09-02 LAB — TRICYCLICS SCREEN, URINE: TCA Scrn: NOT DETECTED

## 2010-09-06 LAB — CBC
HCT: 25.1 % — ABNORMAL LOW (ref 39.0–52.0)
HCT: 27 % — ABNORMAL LOW (ref 39.0–52.0)
Hemoglobin: 8.7 g/dL — ABNORMAL LOW (ref 13.0–17.0)
Hemoglobin: 9.4 g/dL — ABNORMAL LOW (ref 13.0–17.0)
MCHC: 34.6 g/dL (ref 30.0–36.0)
MCHC: 34.8 g/dL (ref 30.0–36.0)
MCV: 90.9 fL (ref 78.0–100.0)
MCV: 91.8 fL (ref 78.0–100.0)
Platelets: 235 10*3/uL (ref 150–400)
Platelets: 281 10*3/uL (ref 150–400)
RBC: 2.73 MIL/uL — ABNORMAL LOW (ref 4.22–5.81)
RBC: 2.97 MIL/uL — ABNORMAL LOW (ref 4.22–5.81)
RDW: 15.5 % (ref 11.5–15.5)
RDW: 15.8 % — ABNORMAL HIGH (ref 11.5–15.5)
WBC: 3 10*3/uL — ABNORMAL LOW (ref 4.0–10.5)
WBC: 3.6 10*3/uL — ABNORMAL LOW (ref 4.0–10.5)

## 2010-09-06 LAB — AST: AST: 58 U/L — ABNORMAL HIGH (ref 0–37)

## 2010-09-06 LAB — HIV-1 RNA QUANT-NO REFLEX-BLD
HIV 1 RNA Quant: 548 copies/mL — ABNORMAL HIGH (ref <48)
HIV-1 RNA Quant, Log: 2.74 {Log} — ABNORMAL HIGH (ref <1.68)

## 2010-09-06 LAB — T-HELPER CELL (CD4) - (RCID CLINIC ONLY)
CD4 % Helper T Cell: 3 % — ABNORMAL LOW (ref 33–55)
CD4 T Cell Abs: 20 uL — ABNORMAL LOW (ref 400–2700)

## 2010-09-06 LAB — ALT: ALT: 81 U/L — ABNORMAL HIGH (ref 0–53)

## 2010-09-06 LAB — GLUCOSE 6 PHOSPHATE DEHYDROGENASE: G-6-PD, Quant: 6 U/g{Hb} — ABNORMAL LOW (ref 7–20)

## 2010-09-07 LAB — BASIC METABOLIC PANEL
BUN: 5 mg/dL — ABNORMAL LOW (ref 6–23)
BUN: 7 mg/dL (ref 6–23)
BUN: 7 mg/dL (ref 6–23)
BUN: 8 mg/dL (ref 6–23)
CO2: 26 mEq/L (ref 19–32)
CO2: 27 mEq/L (ref 19–32)
CO2: 29 mEq/L (ref 19–32)
CO2: 29 mEq/L (ref 19–32)
Calcium: 8.5 mg/dL (ref 8.4–10.5)
Calcium: 8.7 mg/dL (ref 8.4–10.5)
Calcium: 8.7 mg/dL (ref 8.4–10.5)
Calcium: 8.9 mg/dL (ref 8.4–10.5)
Chloride: 90 mEq/L — ABNORMAL LOW (ref 96–112)
Chloride: 90 mEq/L — ABNORMAL LOW (ref 96–112)
Chloride: 94 mEq/L — ABNORMAL LOW (ref 96–112)
Chloride: 97 mEq/L (ref 96–112)
Creatinine, Ser: 0.63 mg/dL (ref 0.4–1.5)
Creatinine, Ser: 0.74 mg/dL (ref 0.4–1.5)
Creatinine, Ser: 0.8 mg/dL (ref 0.4–1.5)
Creatinine, Ser: 0.81 mg/dL (ref 0.4–1.5)
GFR calc Af Amer: >60 mL/min (ref >60)
GFR calc Af Amer: >60 mL/min (ref >60)
GFR calc Af Amer: >60 mL/min (ref >60)
GFR calc Af Amer: >60 mL/min (ref >60)
GFR calc non Af Amer: >60 mL/min (ref >60)
GFR calc non Af Amer: >60 mL/min (ref >60)
GFR calc non Af Amer: >60 mL/min (ref >60)
GFR calc non Af Amer: >60 mL/min (ref >60)
Glucose, Bld: 104 mg/dL — ABNORMAL HIGH (ref 70–99)
Glucose, Bld: 123 mg/dL — ABNORMAL HIGH (ref 70–99)
Glucose, Bld: 96 mg/dL (ref 70–99)
Glucose, Bld: 99 mg/dL (ref 70–99)
Potassium: 3 mEq/L — ABNORMAL LOW (ref 3.5–5.1)
Potassium: 3.5 mEq/L (ref 3.5–5.1)
Potassium: 3.6 mEq/L (ref 3.5–5.1)
Potassium: 3.9 mEq/L (ref 3.5–5.1)
Sodium: 122 mEq/L — ABNORMAL LOW (ref 135–145)
Sodium: 125 mEq/L — ABNORMAL LOW (ref 135–145)
Sodium: 128 mEq/L — ABNORMAL LOW (ref 135–145)
Sodium: 130 mEq/L — ABNORMAL LOW (ref 135–145)

## 2010-09-07 LAB — COMPREHENSIVE METABOLIC PANEL
ALT: 65 U/L — ABNORMAL HIGH (ref 0–53)
ALT: 68 U/L — ABNORMAL HIGH (ref 0–53)
AST: 75 U/L — ABNORMAL HIGH (ref 0–37)
AST: 79 U/L — ABNORMAL HIGH (ref 0–37)
Albumin: 3.4 g/dL — ABNORMAL LOW (ref 3.5–5.2)
Albumin: 3.4 g/dL — ABNORMAL LOW (ref 3.5–5.2)
Alkaline Phosphatase: 52 U/L (ref 39–117)
Alkaline Phosphatase: 67 U/L (ref 39–117)
BUN: 6 mg/dL (ref 6–23)
BUN: 6 mg/dL (ref 6–23)
CO2: 25 mEq/L (ref 19–32)
CO2: 27 mEq/L (ref 19–32)
Calcium: 8.6 mg/dL (ref 8.4–10.5)
Calcium: 9.5 mg/dL (ref 8.4–10.5)
Chloride: 102 mEq/L (ref 96–112)
Chloride: 91 mEq/L — ABNORMAL LOW (ref 96–112)
Creatinine, Ser: 0.72 mg/dL (ref 0.4–1.5)
Creatinine, Ser: 0.86 mg/dL (ref 0.4–1.5)
GFR calc Af Amer: >60 mL/min (ref >60)
GFR calc Af Amer: >60 mL/min (ref >60)
GFR calc non Af Amer: >60 mL/min (ref >60)
GFR calc non Af Amer: >60 mL/min (ref >60)
Glucose, Bld: 96 mg/dL (ref 70–99)
Glucose, Bld: 99 mg/dL (ref 70–99)
Potassium: 3.5 mEq/L (ref 3.5–5.1)
Potassium: 3.8 mEq/L (ref 3.5–5.1)
Sodium: 126 mEq/L — ABNORMAL LOW (ref 135–145)
Sodium: 136 mEq/L (ref 135–145)
Total Bilirubin: 0.5 mg/dL (ref 0.3–1.2)
Total Bilirubin: 1 mg/dL (ref 0.3–1.2)
Total Protein: 8.8 g/dL — ABNORMAL HIGH (ref 6.0–8.3)
Total Protein: 8.9 g/dL — ABNORMAL HIGH (ref 6.0–8.3)

## 2010-09-07 LAB — CBC
HCT: 36.7 % — ABNORMAL LOW (ref 39.0–52.0)
HCT: 39.5 % (ref 39.0–52.0)
HCT: 37.3 % — ABNORMAL LOW (ref 39.0–52.0)
HCT: 41.9 % (ref 39.0–52.0)
Hemoglobin: 12.4 g/dL — ABNORMAL LOW (ref 13.0–17.0)
Hemoglobin: 13.3 g/dL (ref 13.0–17.0)
Hemoglobin: 12 g/dL — ABNORMAL LOW (ref 13.0–17.0)
Hemoglobin: 13.7 g/dL (ref 13.0–17.0)
MCHC: 33.6 g/dL (ref 30.0–36.0)
MCHC: 33.9 g/dL (ref 30.0–36.0)
MCHC: 32.3 g/dL (ref 30.0–36.0)
MCHC: 32.6 g/dL (ref 30.0–36.0)
MCV: 93.7 fL (ref 78.0–100.0)
MCV: 94 fL (ref 78.0–100.0)
MCV: 94 fL (ref 78.0–100.0)
MCV: 95.8 fL (ref 78.0–100.0)
Platelets: 139 10*3/uL — ABNORMAL LOW (ref 150–400)
Platelets: 141 10*3/uL — ABNORMAL LOW (ref 150–400)
Platelets: 127 10*3/uL — ABNORMAL LOW (ref 150–400)
Platelets: 155 10*3/uL (ref 150–400)
RBC: 3.91 MIL/uL — ABNORMAL LOW (ref 4.22–5.81)
RBC: 4.22 MIL/uL (ref 4.22–5.81)
RBC: 3.89 MIL/uL — ABNORMAL LOW (ref 4.22–5.81)
RBC: 4.46 MIL/uL (ref 4.22–5.81)
RDW: 15 % (ref 11.5–15.5)
RDW: 15.1 % (ref 11.5–15.5)
RDW: 15.2 % (ref 11.5–15.5)
RDW: 15.8 % — ABNORMAL HIGH (ref 11.5–15.5)
WBC: 3.7 10*3/uL — ABNORMAL LOW (ref 4.0–10.5)
WBC: 4.6 10*3/uL (ref 4.0–10.5)
WBC: 4.3 10*3/uL (ref 4.0–10.5)
WBC: 4.6 10*3/uL (ref 4.0–10.5)

## 2010-09-07 LAB — URINALYSIS, ROUTINE W REFLEX MICROSCOPIC
Bilirubin Urine: NEGATIVE
Glucose, UA: NEGATIVE mg/dL
Hgb urine dipstick: NEGATIVE
Ketones, ur: NEGATIVE mg/dL
Nitrite: NEGATIVE
Protein, ur: NEGATIVE mg/dL
Specific Gravity, Urine: 1.012 (ref 1.005–1.030)
Urobilinogen, UA: 1 mg/dL (ref 0.0–1.0)
pH: 6 (ref 5.0–8.0)

## 2010-09-07 LAB — PROTIME-INR
INR: 1.1 (ref 0.00–1.49)
INR: 1.1 (ref 0.00–1.49)
Prothrombin Time: 14 seconds (ref 11.6–15.2)
Prothrombin Time: 14.9 seconds (ref 11.6–15.2)

## 2010-09-07 LAB — RAPID URINE DRUG SCREEN, HOSP PERFORMED
Amphetamines: NOT DETECTED
Amphetamines: NOT DETECTED
Barbiturates: NOT DETECTED
Barbiturates: NOT DETECTED
Benzodiazepines: NOT DETECTED
Benzodiazepines: POSITIVE — AB
Cocaine: NOT DETECTED
Cocaine: POSITIVE — AB
Opiates: NOT DETECTED
Opiates: NOT DETECTED
Tetrahydrocannabinol: NOT DETECTED
Tetrahydrocannabinol: NOT DETECTED

## 2010-09-07 LAB — DIFFERENTIAL
Basophils Absolute: 0 10*3/uL (ref 0.0–0.1)
Basophils Relative: 1 % (ref 0–1)
Eosinophils Absolute: 0.1 10*3/uL (ref 0.0–0.7)
Eosinophils Relative: 1 % (ref 0–5)
Lymphocytes Relative: 19 % (ref 12–46)
Lymphs Abs: 0.8 10*3/uL (ref 0.7–4.0)
Monocytes Absolute: 0.3 10*3/uL (ref 0.1–1.0)
Monocytes Relative: 6 % (ref 3–12)
Neutro Abs: 3.3 10*3/uL (ref 1.7–7.7)
Neutrophils Relative %: 73 % (ref 43–77)

## 2010-09-07 LAB — AMMONIA: Ammonia: 19 umol/L (ref 11–35)

## 2010-09-07 LAB — APTT
aPTT: 34 seconds (ref 24–37)
aPTT: 36 seconds (ref 24–37)

## 2010-09-07 LAB — ETHANOL
Alcohol, Ethyl (B): 191 mg/dL — ABNORMAL HIGH (ref 0–10)
Alcohol, Ethyl (B): 7 mg/dL (ref 0–10)
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
## 2010-10-05 NOTE — Discharge Summary (Signed)
NAME:  [name], [name] NO.:  [id number]   MEDICAL RECORD NO.:  [id number]          PATIENT TYPE:  INP   LOCATION:  3742                         FACILITY:  [facility]   PHYSICIAN:  Herbie Saxon, MDDATE OF BIRTH:  02-24-1956   DATE OF ADMISSION:  05/19/2008  DATE OF DISCHARGE:  05/24/2008                               DISCHARGE SUMMARY   ADDENDUM:  The patient is to follow up with Dr. Philipp Deputy at the [facility].   DISCHARGE MEDICATIONS:  Thiamine 100 mg daily.      Herbie Saxon, MD  Electronically Signed     MIO/MEDQ  D:  05/24/2008  T:  05/25/2008  Job:  [phone]

## 2010-10-05 NOTE — Discharge Summary (Signed)
NAME:  [name], [name]               ACCOUNT NO.:  [id number]   MEDICAL RECORD NO.:  [id number]          PATIENT TYPE:  INP   LOCATION:  3742                         FACILITY:  [facility]   PHYSICIAN:  Herbie Saxon, MDDATE OF BIRTH:  17-Jan-1956   DATE OF ADMISSION:  05/19/2008  DATE OF DISCHARGE:  05/24/2008                               DISCHARGE SUMMARY   DISCHARGE DIAGNOSES:  1. Severe anemia improved, status post blood transfusion, on Epogen.  2. Presyncope and no recurrence.  3. Occult gastrointestinal bleed, inactive.  4. Human immunodeficiency virus positive, on antiretroviral therapy.  5. Tobacco, alcohol, cocaine abuse.  6. Severe neutropenia, improved.  7. Hepatitis C positive.  8. Elevated liver function tests.  9. Elevated ferritin.  10.Hyponatremia, resolved.  11.Peripheral neuropathy has improved on Neurontin.  12.Depression.   CONSULTATIONS:  1. Fransisco Hertz, MD, Infectious Disease.  2. Acey Lav, MD, Infectious Disease.  3. Judie Petit T. Russella Dar, MD, [facility], Gastroenterology.  4. Genene Churn. Cyndie Chime, MD, Hematology.   PROCEDURES:  Esophagogastroduodenoscopy was performed on May 21, 2008.  This shows normal mucosa in the fundus with several small  submucosal hemorrhages, otherwise normal examination.   RADIOLOGY:  Chest x-ray on May 20, 2008, shows no acute  cardiopulmonary disease with a __________   HOSPITAL COURSE:  This 56 year old male was incarcerated and has been  diagnosed with HIV [id number] and he is on ARVT.  His antiretroviral  therapy increased at [facility] 2 months earlier.  The CD4 count  is low.  He woke up initially with extreme dizziness and after that he  has been having diarrhea for the last 3 days.  On presentation, he had  hemoglobin of 6.6 and white blood count of 1.8, also Hemoccult was  positive.  The patient was started on IV fluid normal saline hydration,  transfused with 2 packed red blood cells initially.   He was started on  proton pump inhibitor.  His upper endoscopy record did not show any  active bleeding except for submucosal hemorrhages.  Dr. Cyndie Chime,  hematologist, was consulted as the patient was noticed to have a very  high ferritin level to rule out possible hemophagocytosis,  histiocytosis, rule out thalassemia, rule out hemachromatosis, and his  evaluation was likely bone marrow suppression secondary to Septra or  AZT, and treat with adjuvant HIV medication.  Combivir has been  discontinued and the patient was started on Epogen.  Infectious Disease,  Dr. Daiva Eves also agreed to this and agreed to this and he ordered  hepatitis C viral load.  Septra was discontinued, and the patient was  started on Mepron.  There is a possibility of him being restarted on  Combivir after he was reviewed by Infectious Disease as an outpatient at  the [facility].  The patient's blood pressure  has been sustained with IV fluid normal half saline hydration.  Neutropenia has resolved, and he had 2 more units of packed red blood  cell transfused on May 22, 2008.  Anemia is holding stable.  Elevated LFTs resolving.   DISCHARGE CONDITION:  Stable.   DIET:  Regular.   ACTIVITY:  Increase slowly as tolerated.   FOLLOWUP:  At the [facility] Infectious Disease, see Dr. Daiva Eves and  partners.  Follow up with Gastroenterology, Dr. Russella Dar and partners in  the next 2-4 weeks.  Follow up with Infectious Disease in the next 1  week.  Follow up with Dr. Russella Dar, GI in the next 2-4 weeks as needed.  Follow up with  [facility] in the next 5-7 days.  Follow up  with the Hematology with Dr. Cyndie Chime and partners in the next 4-6  weeks as needed.  The patient will need substance abuse detox and psych  followup as needed in the next 2-4 weeks.   DISCHARGE MEDICATIONS:  1. Celexa 20 mg daily.  2. Prezista 600 mg b.i.d.  3. Isentress 400 mg b.i.d.  4. Nova 100 mg  b.i.d.  5. Omeprazole 20 mg daily.  6. Zithromax 1200 mg weekly.  7. Neurontin 100 mg b.i.d.  8. Lamivudine 300 mg daily.  9. Epogen 40,000 units subcu weekly.  10.Mepro 1500 mg daily.  11.Multivitamin 1 tablet daily.   PHYSICAL EXAMINATION:  GENERAL:  This is a middle-aged man, not in acute  distress.  VITAL SIGNS:  Temperature is 98, pulse is 71, respiratory rate of 18,  blood pressure 104/70.  HEENT:  Clinically clear.  No jaundice.  Mucous membranes are moist.  Head is atraumatic and normocephalic.  He has poor dentition.  No  submandibular lymphadenopathy.  No axillary lymphadenopathy.  NECK:  Supple.  No thyromegaly or increased JVD.  CHEST:  Clinically clear.  HEART:  Heart sounds 1 and 2 regular rate and  rhythm.  No rubs,  gallops, or murmurs.  ABDOMEN:  Soft, nontender.  Bowel sounds present.  No organomegaly.  CENTRAL NERVOUS SYSTEM:  Grossly normal.  Cranial nerves II through XII  intact globally.  Peripheral pulses present.  No pedal edema.   LABORATORY DATA:  AST of 58, ALT 81.  WBC is 15.6, hematocrit 25,  platelet count is 235.  HDL cholesterol is 32.  Chemistry, sodium is  139, potassium 3.5, chloride 106, bicarbonate 25, glucose 109, BUN 5,  and creatinine 1.0.  CD4 count on this admission is 30, ferritin level  3579.   DISCHARGE TIME:  Greater than 30 minutes.      Herbie Saxon, MD  Electronically Signed     MIO/MEDQ  D:  05/24/2008  T:  05/24/2008  Job:  [id number]   cc:   Fransisco Hertz, M.D.  Venita Lick. Russella Dar, MD, Arva Chafe. Cyndie Chime, M.D.  Acey Lav, MD  [facility]

## 2010-10-05 NOTE — Discharge Summary (Signed)
NAMEPOSEIDON, PAM NO.:  [id number]   MEDICAL RECORD NO.:  [id number]          PATIENT TYPE:  INP   LOCATION:  5122                         FACILITY:  [facility]   PHYSICIAN:  Alvester Morin, M.D.  DATE OF BIRTH:  01-10-1956   DATE OF ADMISSION:  07/19/2008  DATE OF DISCHARGE:  07/24/2008                               DISCHARGE SUMMARY   Admission July 19, 2008, through July 24, 2008.  This is to clarify  that the patient was discharged on July 24, 2008 instead of July 23, 2008, as previously mentioned on the discharge summary by P.A. Shawn  Rayburn (PA for Dr. Jimmye Norman).  The patient stayed one more day to  complete the work-up for hyponatremia. Work-up was consitent with [facility]  secondary to patient's traumatic brain injury (see dictated discharge  summary by PA Rayburn for details).  The patient was discharged in  stable condition with good improvement of his mental status and was  discharged to his mother's home. Follow-up at the outpatient clinic by  Dr. Yisroel Ramming on July 27, 2008.   For further references please refer to discharge summary of february 27.      Rosanna Randy, MD  Electronically Signed      Alvester Morin, M.D.  Electronically Signed    CEM/MEDQ  D:  08/24/2008  T:  08/25/2008  Job:  [id number]

## 2010-10-05 NOTE — Discharge Summary (Signed)
NAME:  [name], ANTHIS NO.:  [id number]   MEDICAL RECORD NO.:  [id number]          PATIENT TYPE:  INP   LOCATION:  1502                         FACILITY:  [facility]   PHYSICIAN:  Ladell Pier, M.D.   DATE OF BIRTH:  [birth year]   DATE OF ADMISSION:  02/28/2007  DATE OF DISCHARGE:                               DISCHARGE SUMMARY   DATE OF DISCHARGE:  To be determined, the patient is awaiting a bed in  Behavioral [facility].   DISCHARGE DIAGNOSES:  1. Drug overdose.  2. Acquired immunodeficiency syndrome.  3. Acquired immunodeficiency syndrome/human immunodeficiency virus      obtained from a heterosexual relationship.  4. Emphysema/chronic obstructive pulmonary disease.  5. Chronic chest pain.  6. Depression.  7. Schizoaffective disorder.  8. Hypokalemia.  9. Alcohol abuse.  10.Abdominal pain, resolved.  Negative KUB.   DISCHARGE MEDICATIONS:  1. Multivitamin daily.  2. Thiamine 100 mg daily.  3. Folic acid 1 mg daily.  4. Ativan, he is on the Ativan withdrawal protocol.  He is at 1 mg      q.12 h., then 1 mg daily, then stop.  5. Atripla 1 mg q.h.s.   FOLLOWUP APPOINTMENT:  The patient is discharged to [facility].   PROCEDURE:  None.   CONSULTATIONS:  Antonietta Breach, M.D.   HISTORY OF PRESENT ILLNESS:  The patient is a 55 year old African  American male with a past medical history significant for HIV/AIDS.  The  patient stated that he took some trazodone, Celexa, Abilify, and crack  cocaine and coincidentally drank a lot of beer.  He was vague with  regards to the doses of the pills that he took.  He drank at least 24  beers.  Please see admission H&P for the remainder of history.   PAST MEDICAL HISTORY:  Please refer to the patient's H&P.   FAMILY HISTORY:  Please refer to the patient's H&P.   SOCIAL HISTORY:  Please refer to the patient's H&P.   MEDICATIONS:  Please refer to the patient's H&P.   ALLERGIES:  Please refer to the patient's  H&P.   DISCHARGE PHYSICAL EXAMINATION:  VITAL SIGNS:  Temperature 99.1, pulse  of 104, respirations 20, blood pressure 127/87, pulse of 94% on room  air.  HEENT:  Head is normocephalic atraumatic.  Pupils are equal, round, and  reactive to light. Throat without erythema.  CARDIOVASCULAR:  Regular rate and rhythm.  LUNGS:  Clear bilaterally.  ABDOMEN:  Positive bowel sounds.  EXTREMITIES:  Without edema.   HOSPITAL COURSE:  1. Drug overdose.  The patient was admitted to the psych unit and      observed.  He was asymptomatic.  The patient was evaluated by      psychiatry and was recommended to be transferred to [facility] when a bed became available.  The patient has no medical      insurance, so therefore, the patient is presently awaiting      placement.  2. HIV/AIDS.  The patient did not know exactly what medications he  took.  Did discuss with [facility], where the patient gets his      drugs, and he does take Atripla, started him on that medication.  3. Hypokalemia.  The patient's potassium was mildly low, started him      on potassium replacement.  Potassium was normal prior to transfer.  4. Hypertension.  The patient's blood pressure remained stable.  On      discharge the patient's blood pressure is 127/87.  5. Alcohol abuse.  The patient was placed on the Ativan withdrawal      protocol and did not experience any withdrawal symptoms.   Sodium 136, potassium 3.4, chloride 103, CO2 25, BUN 3, creatinine 0.72,  glucose 95.  WBC 4.9, hemoglobin 13.1, platelets 143.  Blood cultures  negative x2.  Cardiac enzymes negative.  Salicylate less than 4.  Acetaminophen less than 10.  Abdominal film with no acute obstruction.  Chest x-ray showed no acute disease.      Ladell Pier, M.D.  Electronically Signed     NJ/MEDQ  D:  03/04/2007  T:  03/04/2007  Job:  [id number]
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
## 2010-10-05 NOTE — Consult Note (Signed)
NAMECRUZE, ZINGARO NO.:  [id number]   MEDICAL RECORD NO.:  [id number]          PATIENT TYPE:  INP   LOCATION:  1502                         FACILITY:  [facility]   PHYSICIAN:  Antonietta Breach, M.D.  DATE OF BIRTH:  Oct 01, 1955   DATE OF CONSULTATION:  03/04/2007  DATE OF DISCHARGE:                                 CONSULTATION   REQUESTING PHYSICIAN:  [facility].   REASON FOR CONSULTATION:  Suicide attempt with overdose.   HISTORY OF PRESENT ILLNESS:  [name] is a 55 year old male  admitted to the [facility] on the 8th of October 2008, after  an intentional overdose.   The patient has been experiencing 4 weeks of progressive depressed mood,  difficulty concentrating, anhedonia leading to suicidal thoughts.  He  has had low energy.  He deliberately overdosed on a combination of  Celexa, Abilify, crack cocaine, and 2 cases of beer.   At the time of the examination, the patient does continue with suicidal  thoughts, low energy, anhedonia, and depressed mood.  He has a Comptroller in  the room for suicide precautions.   PAST PSYCHIATRIC HISTORY:  The patient does have a history of multiple  major depressive episodes.  He has been diagnosed with schizoaffective  disorder.  He does have a history of hallucinations.   It is unclear whether he has had hallucinations when his mood is normal.   He does have a history of multiple suicide attempts.   He was admitted to the [facility] in January 2002  with severe depression, suicidal thoughts, command self-destructive  auditory hallucinations.  At that time, he also had been drinking  several quarts of beer per day with liquor on top.   The patient has a history of self-cutting and self-burning.  He has been  admitted to [facility] as well as [facility]  [place].   Past psychotropic trials included Wellbutrin.  His discharge medications  in 2002 from the  [facility] were:  1. Celexa 20 mg daily.  2. Wellbutrin 200 mg daily.  3. On review of the past medical record, he also was discharged at      that time on Zyprexa 5 mg daily and trazodone 50 mg q.h.s.   The patient states that the best combination of medications for anti-  depression and anti-hallucinations have been Celexa combined with  Abilify.   FAMILY PSYCHIATRIC HISTORY:  None known.   SOCIAL HISTORY:  [name] is divorced.  He did have a 12th grade  education; however, he has been medically disabled due to his  psychiatric condition and is unemployed.  Please see the past medical  history.  The patient's only child died after 61 months of age.  Please  see the above history regarding his substance abuse history.   PAST MEDICAL HISTORY:  1. 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  2. He has COPD.   MEDICATIONS:  His MAR is reviewed.  He is not currently on psychotropic  standing medication.  LABORATORY DATA:  Urine drug screen was positive for cocaine; alcohol  was 171 in the emergency room.  Tylenol negative.  Aspirin negative.  WBC:  3.3, hemoglobin 12.2, platelet count 126, SGOT 111, SGPT 79,  albumin 2.7, sodium 138.   REVIEW OF SYSTEMS:  CONSTITUTIONAL:  Afebrile, no weight loss.  HEAD:  No trauma.  EYES:  No visual changes.  EARS:  No hearing impairment.  NOSE:  No rhinorrhea.  MOUTH/THROAT:  No sore throat.  NEUROLOGIC:  No  focal motor or sensory deficits.  PSYCHIATRIC:  As above.  CARDIOVASCULAR:  No chest pain, palpitations.  RESPIRATORY:  No coughing  or wheezing.  GASTROINTESTINAL:  No nausea, vomiting, diarrhea.  SKIN:  Unremarkable.  ENDOCRINE/METABOLIC:  No heat or cold intolerance.  MUSCULOSKELETAL:  No deformities.  HEMATOLOGIC/LYMPHATIC:  Mild anemia.   The patient also has thrombocytopenia.   EXAMINATION:  VITAL SIGNS:  Temperature 98.6, pulse 62, respiratory rate  16, blood pressure 138/49, O2  saturation on room air 97%.   GENERAL APPEARANCE:  [name] is a middle-aged male lying in the left  lateral decubitus position in his hospital bed.  He has no abnormal  involuntary movements.   MENTAL STATUS EXAM:  [name] is alert.  His attention span is  slightly decreased.  His eye contact is intermittent.  He is oriented to  all spheres.  His memory is intact to immediate, recent, and remote.  His fund of knowledge and intelligence are within normal limits.  His  speech is soft.  It has slightly flat prosody.  There is no dysarthria.  His affect is very constricted.  His mood is depressed.  Thought  process:  Logical, coherent, goal-directed, no looseness of association.  Thought content:  The patient is having suicidal ideation.  He has no  thoughts of harming others.  There are no hallucinations at the time of  the exam.  There are no delusions.  His insight is partial.  His  judgment is impaired for self care; however, his judgment is intact for  informed consent regarding his psychiatric care.   ASSESSMENT:  AXIS I:  1. 293.83, mood disorder not otherwise specified, depressed      (functional and general medical factors).  2. 296.33, major depressive disorder, recurrent severe, provisional.  3. Rule out 295.79 schizoaffective disorder.  4. Alcohol dependence.  5. Cocaine abuse.  AXIS II:  Deferred.  AXIS III:  See general medical problems above.  AXIS IV:  General medical, primary support group.  AXIS V:  30.   [name] is still at risk to harm himself.   The undersigned provided ego-supportive psychotherapy and education.   The indications, alternatives, and adverse effects of Abilify and Celexa  were discussed with the patient.  He wants to restart them.   RECOMMENDATIONS:  1. I would restart Celexa at 10 mg q.a.m. for anti-depression and then      would increase it to 20 mg q.a.m. as tolerated.  2. Would start Abilify at 10 mg q.h.s. for antipsychosis and       augmenting Celexa.  I would then increase it to 15 mg daily as      tolerated.   I would continue the sitter for suicide precautions.   I would admit the patient to a psychiatric unit once medically cleared.   Continue low stimulation ego-supportive psychotherapy.      Antonietta Breach, M.D.  Electronically Signed     JW/MEDQ  D:  03/04/2007  T:  03/04/2007  Job:  [id number]

## 2010-10-05 NOTE — Discharge Summary (Signed)
NAME:  [name], [name] NO.:  [id number]   MEDICAL RECORD NO.:  [id number]          PATIENT TYPE:  IPS   LOCATION:  0502                          FACILITY:  [facility]   PHYSICIAN:  Geoffery Lyons, M.D.      DATE OF BIRTH:  05-29-1955   DATE OF ADMISSION:  08/02/2007  DATE OF DISCHARGE:  08/08/2007                               DISCHARGE SUMMARY   CHIEF COMPLAINT AND HISTORY OF PRESENT ILLNESS:  This was one of several  admissions to [facility] for this 55 year old African-  American male, single, voluntarily admitted, abusing alcohol more  heavily the week before along with 200 dollars worth of crack cocaine.  Began to experience some withdrawal.  Has been having some  hallucinations using cocaine, drinking 1/2 case of beer per day, had at  least 200 dollars worth of crack cocaine over the last few days. Not  having any more money. Left, had been living with his mother but cannot  return there. Thinking about going to the hospital for a long-term  treatment.   PAST PSYCHIATRIC HISTORY:  Being followed by [facility] and by [facility] ACT team. Last admission October 2008 for  depression, anxiety and polysubstance abuse.   ALCOHOL AND DRUG HISTORY:  Persistent use of alcohol as well as crack  cocaine. Longest term of sobriety not known.   MEDICAL HISTORY:  1. HIV-AIDS.  2. Asthma.  3. COPD.  4. Hepatitis C.  5 . Peripheral neuropathy.   MEDICATIONS:  1. Abilify 10 mg per day.  2. Atripla 1 tablet at bedtime.  3. Neurontin 300 mg 3 times a day.  4. Celexa 20 mg per day.   PHYSICAL EXAMINATION:  The exam failed to show any acute findings.   LABORATORY WORKUP:  White blood cells 2.9, hemoglobin 13. Sodium 138,  potassium 3.8, BUN 11, creatinine 1.06, glucose 97, SGOT 77, SGPT 42,  total bilirubin 0.6.   MENTAL STATUS EXAM:  Reveals a fully alert and cooperative male, bright  affect, mood anxious, affect anxious.  Wants to  go to a longer term  placement.  Thought processes logical, coherent and relevant.  No active  delusions.  No active suicidal or homicidal ideas. No hallucinations.  Cognition well preserved.   ADMISSION DIAGNOSES:  AXIS I:  1. Alcohol and cocaine dependence.  2. Schizoaffective disorder.  AXIS II:  No diagnosis.  AXIS III:  1. HIV.  2. Peripheral neuropathy.  3. Hepatitis.  4 . Chronic obstructive pulmonary disease.  AXIS IV:  Moderate.  AXIS V:  Upon admission 35, highest GAF in the last year 60.   COURSE IN THE HOSPITAL:  He was admitted.  He was detoxified with  [facility].  He was maintained on his medications..  He was in a group home  for 7 months.  He left and went to live with his mother.  He goes to the  [employer]  He was much focused on going somewhere other that his  mother's, looking to [place] or [facility]. Felt that he was not going to be  able to make it if he were to go back to his mother's.  By March 15, he  was ready to be more optimistic about his options.  Pl[facility] to [facility] to  [facility].  Wanted to get stronger and be able to  face his substance use. We continued to work on stabilization.  He was  fully detoxified. March 17 he was accepted to [facility].  Upon  reassessment of his case, he did endorse auditory hallucinations, voices  that call his name.  He gets paranoid, thinks other people are going to  hurt him, also experiences ideas of reference. People talk about him,  look at him. TV and radio talk to him and send him messages. Also has  mood swings with depression, decreased energy, decreased motivation,  decreased sleep, increased or decreased appetite, crying spells. Also  experiences change of mood with racing thoughts, agitation, and the  symptoms were present even when not using. So we assessed  schizoaffective disorder, depressed. He was encouraged.  He was  motivated that he was going to go to [facility]. So on March 18, fully   detoxified with improved mood and affect, still on his medications, we  went ahead and discharged to outpatient followup through [facility].   DISCHARGE DIAGNOSES:  AXIS I:  1. Alcohol and cocaine dependence.  2. Schizoaffective disorder.  3. Depressed.  AXIS II:  No diagnosis.  AXIS III:  1. Human immunodeficiency virus, acquired immune deficiency syndrome.  2 . Asthma.  1. Chronic obstructive pulmonary disease.  2. Hepatitis  C.  3. Peripheral neuropathy.  AXIS IV:  Moderate.  AXIS V:  Upon discharge 50-55.   DISCHARGE MEDICATIONS:  1. Celexa 20 mg per day.  2. Abilify 10 mg per day.  3. Neurontin 300 three times a day.  4 . Atripla 1 tablet at night.   FOLLOWUP:  [facility] in [place].      Geoffery Lyons, M.D.  Electronically Signed     IL/MEDQ  D:  09/04/2007  T:  09/04/2007  Job:  [id number]
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
## 2010-10-05 NOTE — Consult Note (Signed)
NAMESEM, MCCAUGHEY NO.:  [id number]   MEDICAL RECORD NO.:  [id number]          PATIENT TYPE:  INP   LOCATION:  5122                         FACILITY:  [facility]   PHYSICIAN:  Antonietta Breach, M.D.  DATE OF BIRTH:  03/29/1956   DATE OF CONSULTATION:  07/21/2008  DATE OF DISCHARGE:                                 CONSULTATION   REQUESTING PHYSICIAN:  Cherylynn Ridges, M.D.   REASON FOR CONSULTATION:  Confusion, agitation, evaluate, recommend  treatment, assess capacity.   HISTORY OF PRESENT ILLNESS:  [name] is a 55 year old male admitted  to the [facility] on July 19, 2008, due to acute mental status  changes and intracranial hemorrhage.   [name] suffered a fall and has five frontal hemorrhagic contusions.   He has been demonstrating impaired judgment.  He left the hospital  against medical advice last week.  He continues with worsening judgment.  He has severe impairment in short-term recall memory.   His judgment remains impaired.  He has poor reasoning.  At the time of  the undersigned's visit, he is not agitated or combative.  He is  cooperative with bedside care.   His impairment, judgment and memory difficulty along with impaired  reasoning are correlated with his brain injury.   However, he does have a history of functional mental illness.   He is currently on Abilify 10 mg nightly for anti-psychosis and mood  stabilization.  He is also on Celexa 20 mg q.a.m. for anti-depression.   PAST PSYCHIATRIC HISTORY:  [name] does have a history of several-day  episodes involving increased energy, decreased need for sleep and  impaired judgment.  He has undergone two psychiatric admissions.  He  began to hear voices in the form of hallucinations in his 82s.   FAMILY PSYCHIATRIC HISTORY:  None known.   SOCIAL HISTORY:  [name] is from [place].  He is married.  He has  been using cocaine.   He is not using alcohol.   PAST MEDICAL  HISTORY:  1. HIV positive.  2. Hepatitis positive.  3. Bilateral frontal hemorrhagic contusions.   MEDICATIONS:  1. Abilify 10 mg p.o. nightly.  2. Celexa 20 mg p.o. q.a.m.  3. Neurontin 300 mg p.o. t.i.d.   ALLERGIES:  1. HALDOL.  2. ZIDOVUDINE.  3. VIDEX   LABORATORY DATA:   DICTATION ENDED HERE.      Antonietta Breach, M.D.  Electronically Signed     JW/MEDQ  D:  07/21/2008  T:  07/21/2008  Job:  [id number]

## 2010-10-05 NOTE — Consult Note (Signed)
[name], [name] NO.:  [id number]   MEDICAL RECORD NO.:  [id number]          PATIENT TYPE:  INP   LOCATION:  5122                         FACILITY:  [facility]   PHYSICIAN:  Antonietta Breach, M.D.  DATE OF BIRTH:  07-30-1955   DATE OF CONSULTATION:  07/21/2008  DATE OF DISCHARGE:                                 CONSULTATION   ADDENDUM:   LABORATORY DATA:  Sodium 123, BUN of 5, creatinine 0.67, glucose 95.  WBC is 4.4, hemoglobin 13.8, platelet count 157.  SGOT 75, SGPT 68.  Urine drug screen positive for benzodiazepines and cocaine.  Alcohol on  February 22, was 191.  Ammonia was unremarkable.  His head CT shows  bifrontal hemorrhagic contusions.   REVIEW OF SYSTEMS:  [name] can provide a significant amount of the  review of systems.  The rest of it is gleaned from the electronic  medical record and the staff.  CONSTITUTIONAL, HEAD, EYES, EARS, NOSE,  THROAT, MOUTH,  NEUROLOGIC, PSYCHIATRIC, CARDIOVASCULAR, RESPIRATORY,  GASTROINTESTINAL, GENITOURINARY, MUSCULOSKELETAL, SKIN, HEMATOLOGIC,  LYMPHATIC, ENDOCRINE, METABOLIC:  All unremarkable.   PHYSICAL EXAMINATION:  VITAL SIGNS:  Temperature 98.4, pulse 52,  respiratory rate 18, blood pressure 135/82, O2 saturation on room air  97%.  GENERAL APPEARANCE:  [name] is a middle-aged male lying in a supine  position in his hospital bed with no abnormal involuntary movements.   MENTAL STATUS EXAM:  [name] does have intact orientation for the  year, the month, the day of the month and the day of the week.  He also  was oriented to place and person.  He is alert.  His eye contact is  good.  His concentration is decreased.  His affect is anxious.  His mood  is anxious.  On memory testing he recalls 3/3 immediate, but 0/3 on  recall.  His fund of knowledge and intelligence are below that of his  estimated premorbid baseline.  His speech is showing a slightly flat  prosody with no dysarthria.  There is no  pressured speech.  Thought  process is coherent.  Thought content:  No thoughts of harming himself  or others, no delusions or hallucinations.  His insight is poor.  His  judgment is impaired.   ASSESSMENT:  AXIS I:  293.82.  1.  Psychotic disorder not otherwise  specified.  This is currently stable.  2.  There is a possible history  of schizophrenia, as well as schizoaffective disorder.  3.  Rule out  296.80, bipolar disorder not otherwise specified.  4.  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  The patient's dementia symptoms and the diagnosis  of dementia not otherwise specified represent his decapacitating  diagnosis.  AXIS II:  Deferred.  AXIS III:  See past medical history.  AXIS IV:  General medical.  AXIS V:  30.   [name] demonstrates critical impairment in reasoning, the ability to  appreciate his risk of morbidity and mortality without the appropriate  supportive environment.  He also demonstrates impaired judgment.  He  does not have the capacity to choose his discharge environment.  RECOMMENDATIONS:  1. We will continue to keep memory and orientation cues in the room.  2. Ativan 0.5-2 mg p.o. IM or IV q.4h. p.r.n. agitation with caution      about sedation or ataxia.  3. No change in his current maintenance psychotropic agents including      Abilify 10 mg daily for anti-psychosis and mood stabilization of      his cyclic mood disorder, Celexa 20 mg q.a.m. for anti-depression.  4. [name] does require placement in a facility that can provide 24-      hour per day supervision.  5. Would ask the social worker to set [name] up with a psychiatric      outpatient appointment during the first week of discharge.  6. As [name] will be maintained on Abilify, he will require      periodic abnormal involuntary movement scales, as well as      hemoglobin A1c check for adverse Abilify effects.      Antonietta Breach, M.D.  Electronically Signed     JW/MEDQ  D:   07/22/2008  T:  07/22/2008  Job:  [id number]

## 2010-10-05 NOTE — Discharge Summary (Signed)
NAMEPHILBERT, OCALLAGHAN NO.:  [id number]   MEDICAL RECORD NO.:  [id number]          PATIENT TYPE:  IPS   LOCATION:  0605                          FACILITY:  [facility]   PHYSICIAN:  Jasmine Pang, M.D. DATE OF BIRTH:  1956/02/06   DATE OF ADMISSION:  03/05/2007  DATE OF DISCHARGE:  03/13/2007                               DISCHARGE SUMMARY   IDENTIFYING INFORMATION:  A 55 year old African American male who was  admitted on a voluntary basis on March 05, 2007.   HISTORY OF PRESENT ILLNESS:  The patient was admitted to the medical  unit on February 28, 2007 after using cocaine and alcohol.  I got mixed  up on my meds and might have overdosed, I asked for charcoal.  The  patient relapsed on cocaine and alcohol after being abstinent since  April 2008.  Relapse was triggered by college homecoming celebration.  He denies suicidal intent.  He regrets his relapse and pledges  abstinence.  He is denying suicidal or homicidal ideation or  hallucinations.  The patient is seen by the [facility].  His  doctor is Dr. Gean Quint.  This is the second [facility] admission for the patient.  He has a history of schizoaffective disorder.  The patient has a history  of coronary artery disease.  He is HIV positive.  He states he is  compliant with his medications.  He previously did well on Celexa 20 mg  daily, Abilify 10 mg daily, and Atripla 1 daily.  He is allergic to  HALDOL and ACT.   PHYSICAL EXAMINATION:  These were done in the medical unit prior to  transfer here.  There were no acute medical or physical problems.  As  indicated above, the patient is HIV positive.   ADMISSION LABORATORIES:  These were done on the hospital unit at [facility] prior to transfer to our unit.  CBC with diff was within normal  limits.  Urine drug screen positive for cocaine.  Basic metabolic panel  was grossly within normal limits except for a low sodium of 130.  This  was dealt with on the inpatient  unit at the hospital during his medical  hospital stay.  Glucose was also elevated at 129.  Urinalysis was within  normal limits.   HOSPITAL COURSE:  Upon admission, the patient was started on trazodone  50 mg p.o. q.h.s. p.r.n..  He was started on Librium 25 mg p.o. q.6h.  p.r.n. symptoms of withdrawal, Reglan 10 mg p.o. q.6h. p.r.n. nausea,  Senna 1 tablet p.o. daily p.r.n. constipation, Protonix 40 mg daily,  Combivent inhaler 2 puffs q.i.d., Atripla 1 tablet p.o. q.h.s., Celexa  10 mg p.o. daily, and Abilify 5 mg p.o. q.h.s. He was also started on  albuterol MDI inhaler 2 puffs q.4h. p.r.n..  On March 06, 2007, the  Protonix was discontinued.  Reglan was discontinued.  Celexa was  increased to 20 mg daily.  Abilify was increased to 10 mg after supper.  Atripla dosing was changed to supper.  Azithromycin 500 mg daily x5 days  then 200 mg  once weekly was started.  Bactrim DS 1 daily on Mondays,  Wednesdays, and Fridays.  On March 08, 2007, due to some open lesions  on his both arms, he was started on 0.50% cortisone cream to affected  lesions b.i.d. x3 days.  On March 12, 2007, due to difficulty  sleeping, the trazodone was discontinued.  He was started back on Ambien  10 mg p.o. q.h.s.  The patient tolerated these medications well with no  significant side effects other than some sedation.  He was initially  reserved and depressed and anxious.  He was worried about his HIV  status.  He stated he had full blown AIDS .  He was having positive  suicidal ideation off and on, but could contract for safety in the  hospital.  He states in the past he had had auditory hallucinations, but  was not having any now.  His hospitalization progressed.  His mood  continued to be somewhat depressed and anxious.  He stayed in bed quite  a bit though he would get up and participate appropriately in some unit  therapeutic groups and activities.  The lesions on his arms were healing  with cortisone  cream and    Dictation ended at this point.      Jasmine Pang, M.D.  Electronically Signed     BHS/MEDQ  D:  03/13/2007  T:  03/13/2007  Job:  [id number]
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
## 2010-10-05 NOTE — H&P (Signed)
NAME:  [name], GULLICKSON NO.:  [id number]   MEDICAL RECORD NO.:  [id number]          PATIENT TYPE:  IPS   LOCATION:  0502                          FACILITY:  [facility]   PHYSICIAN:  Geoffery Lyons, M.D.      DATE OF BIRTH:  Sep 21, 1955   DATE OF ADMISSION:  08/02/2007  DATE OF DISCHARGE:                       PSYCHIATRIC ADMISSION ASSESSMENT   DATE OF THE ASSESSMENT:  August 03, 2007, at 9:40 a.m.   IDENTIFYING INFORMATION:  A 55 year old [place] American male.  He is  single.  This is a voluntary admission.   HISTORY OF THE PRESENT ILLNESS:  Hooper reports that he began using  alcohol more heavily last week along with $200 worth of crack cocaine.  Began to experience a few withdrawal symptoms and had been having some  hallucinations after using cocaine.  Currently drinking one-half case of  beer per day and has used at least $200 worth of crack cocaine daily for  the last few days.  Not having any more money left had been living with  his mother but cannot return there.  He is thinking that he needs to go  to the [facility] for some long-term treatment and presents requesting  detoxification.  No auditory hallucinations today.  He denies any  suicidal thoughts.  Reports he has been compliant with his medications.   PAST PSYCHIATRIC HISTORY:  The patient is currently followed by Dr.  Adah Salvage, M.D., at [facility] and by the [facility].  This is one of several [facility] admissions.  Last admission  October 13 to March 13, 2007, for depression, anxiety and  polysubstance abuse, and periods of abstinence are unclear.  He also has  a history of schizoaffective disorder and polysubstance dependence.   SOCIAL HISTORY:  Single African American male, was previously staying in  a group home in [place], [place], stayed there about 7 months,  says that that did help his abstinence but he does not want to return  there because he cannot save any  money.  He is on the list for section 8  housing and wants to get his own apartment.  Recently living with his  mother, is not able to return there, currently homeless.  No current  legal charges.   FAMILY HISTORY:  Father with some history of alcohol abuse.   MEDICAL HISTORY:  The patient is followed by Dr. Yisroel Ramming at the  [facility].  Medical problems include:  1. HIV/AIDS.  2. Asthma.  3. History of COPD.  4. History of hepatitis C.  5. Peripheral neuropathy NOS.   CURRENT MEDICATIONS:  He reports are:  1. Abilify 10 mg daily.  2. Atripla one tablet at bedtime.  3. Neurontin 300 mg t.i.d.  4. Celexa 20 mg daily.   DRUG ALLERGIES:  HALDOL DECANOATE.   PHYSICAL EXAMINATION:  Was done in the emergency room at [facility].  Pleasant, single African American male in no distress.  Temperature  97.9, pulse 70, respirations 20, blood pressure 146/95.  Diagnostic  studies were done in the emergency  room.  CBC:  WBC 2.9; hemoglobin 13;  hematocrit 39.7; and platelets 178,000.  Chemistry:  Sodium 138,  potassium 3.8, chloride 107, carbon dioxide 28, BUN 11, creatinine 1.06,  and random glucose 97.  Liver enzymes:  SGOT 77, SGPT 42, alkaline  phosphatase 58, total bilirubin 0.6.  Most recent CD-4 count from  February was 20.   MENTAL STATUS EXAM:  Fully alert gentleman, cooperative, fairly bright  affect.  No signs of acute withdrawal today.  He is on our Librium  protocol.  Thinking is goal directed.  He has plans to go to the [place], is  quite resourceful, has means for getting there, notes the location of  the [place] office here in [place] and typically has had followup in the  past at the [facility] in [place].  Mood is neutral.  Speech is  normal.  Thought process logical, coherent, goal directed.  No  hallucinations.  No suicidal or homicidal thought.  No paranoia.  No  delusional statements.  No evidence of any psychosis.  Cognition is  intact.  He  reports he has been taking his medications regularly.   AXIS I:  Alcohol abuse and dependence, cocaine abuse rule out  dependence.  AXIS II:  No diagnosis.  AXIS III:  Human immunodeficiency virus/acquired immunodeficiency  syndrome, peripheral neuropathy not otherwise specified, history of  hepatitis C, history of chronic obstructive pulmonary disease.  AXIS IV:  Severe issues with homelessness.  AXIS V:  Current 49, past year 15.   PLAN:  Is to voluntarily admit him with a goal of a safe detoxification  in 4 days.  We have started him on a Librium protocol.  Will continue  his other routine medications.  We plan to contact [facility]  and see what assistance we can give him in obtaining secure housing.  Estimated length of stay is 5 days.      Margaret A. Scott, N.P.      Geoffery Lyons, M.D.  Electronically Signed    MAS/MEDQ  D:  08/03/2007  T:  08/04/2007  Job:  [id number]

## 2010-10-05 NOTE — Discharge Summary (Signed)
NAMECLEVLAND, CORK NO.:  [id number]   MEDICAL RECORD NO.:  [id number]          PATIENT TYPE:  IPS   LOCATION:  0605                          FACILITY:  [facility]   PHYSICIAN:  Jasmine Pang, M.D. DATE OF BIRTH:  1955/12/05   DATE OF ADMISSION:  03/05/2007  DATE OF DISCHARGE:  03/13/2007                               DISCHARGE SUMMARY   ADDENDUM   HOSPITAL COURSE CONTINUED:  As hospitalization progressed, mental status  improved.  The patient became less depressed and anxious.  He stated he  talked to his mother over the phone and felt supported by her.  He was  happy about his [facility] program and working with them to get section  eight housing, he was hopeful for this.  On March 13, 2007, mental  status had improved markedly from admission status.  The patient was  friendly and cooperative with good eye contact.  Speech was normal rate  and flow.  Psychomotor activity was within normal limits.  Mood  euthymic.  Affect wide range.  There was no suicidal or homicidal  ideation.  No thoughts of self-injurious behavior.  No auditory or  visual hallucinations.  No paranoia or delusions.  The cognitive was  grossly back to baseline.  It was felt the patient was safe to go home  today.  He was going to return to the [facility].  He is going  to return home to live in the house next to his mother, but he is hoping  for section eight housing soon.   DISCHARGE DIAGNOSES:  AXIS I.  Polysubstance dependence. Schizoaffective  disorder.  AXIS II:  None.  AXIS III:  HIV positive.  AXIS IV:  Moderate (problems with primary support group, housing  problem, economic problem, other psychosocial problem, medical problems,  burden of psychiatric illness).  AXIS V:  Global assessment of functioning upon discharge was 50.  Global  assessment of functioning upon admission was 36.  Global assessment of  functioning highest past year was 65.   DISCHARGE PLANS:  There  were no specific activity level or dietary  restrictions.   POST-HOSPITAL CARE PLANS:  The patient will be seen by the [facility].  They will pick him up today from the hospital and continue their  comprehensive treatment of him.   DISCHARGE MEDICATIONS:  1. Combivent inhaler 2 puffs 4 times daily.  2. Abilify 10 mg p.o. every 6:00 p.m.  3. Celexa 20 mg p.o. every 6:00 p.m.  4. Atripla tablet 6:00 p.m.  5. Zithromax 1200 mg every 7 days.  6. Septra DS 1 tablet every on Monday, Wednesday, and Friday.  7. Ambien 10 mg at bedtime.      Jasmine Pang, M.D.  Electronically Signed     BHS/MEDQ  D:  03/13/2007  T:  03/13/2007  Job:  [id number]
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
## 2010-10-08 NOTE — Op Note (Signed)
. [facility]  Patient:    [name], [name]                      MRN: [id number] Proc. Date: 03/30/00 Adm. Date:  [id number] Attending:  Dominica Severin                           Operative Report  DATE OF BIRTH:  1955/05/28.  PREOPERATIVE DIAGNOSES:  Right small finger open PIP joint with extensor tendon laceration.  This is a septic joint.  POSTOPERATIVE DIAGNOSES:  Right small finger open PIP joint with extensor tendon laceration.  This is a septic joint.  PROCEDURE: 1. I&D (excisional debridement) left small finger, skin and subcutaneous    tissue and PIP joint. 2. Repair extensor digitorum communis right small finger.  SURGEON:  Elisha Ponder, M.D.  ASSISTANT:  None.  COMPLICATIONS:  None.  ANESTHESIA:  General.  TOURNIQUET TIME:  Less than an hour.  INDICATIONS FOR PROCEDURE:  This patient is a 55 year old white male who presents with the above mentioned diagnoses.  He has undergone I&D and presents now for a second washout and possible tendon repair pending wound conditions etc.  He has HIV positive status, he is growing out Staphylococcus. He is alert and oriented and understands the risks and benefits of surgery.  OPERATIVE FINDINGS:  This patient had a open PIP joint with extensor tendon laceration previously noted.  The wound was copiously I&Dd without difficulty.  I felt wound conditions were stable enough a repair of the EDC was accomplished.  I took great care to examine the field prior to repair this.  OPERATIVE PROCEDURE IN DETAIL:  The patient was seen by myself and anesthesia preoperatively.  He was taken to the operative suite.  Blood precautions were performed at all times.  He underwent a smooth induction of anesthesia. Following this the right upper extremity is prepped and draped in the usual sterile fashion.  I examined the wound preoperatively and there was not a significant amount of ______ that had  reaccumulated.  Once sterile prep and drape were accomplished the patient underwent a excisional debridement including particulate matter about the right small finger PIP joint region. This included skin and subcutaneous tissue, extensor tendon and PIP joint itself.  The patient had this performed without difficulty.  I felt wound conditions were stable at this juncture 48-72 hours after the initial I&D to accomplish the extensor repair and thus the extensor tendon was repaired. This was done without difficulty.  I did leave some gaping on the medial and lateral edges to allow for any egressive fluid from the PIP joint.  Following this the patient had further irrigation applied to the wound.  Total irrigation at the end of the case was greater than 2 liters.  The extensor tendon was approximated to my satisfaction.  The dorsal wound was then debrided of any nonviable epidermal skin tissue and the patient then underwent a loose closure with 2 vessel loops placed underneath the skin.  He tolerated this well.  Tourniquet time was less than 30 to 45 minutes.  The patient had excellent refill after sterile compressive dressing and splint were applied. He will be monitored closely as an inpatient.  The patient at present time is stable.  All questions have been encouraged and answered. DD:  03/30/00 TD:  03/31/00 Job: [id number]
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
## 2010-10-08 NOTE — Op Note (Signed)
[facility]  Patient:    [name], [name] Visit Number: [id number] MRN: [id number]          Service Type: SUR Location: 3W [id number] 01 Attending Physician:  Marlowe Shores Dictated by:   Artist Pais Mina Marble, M.D. Proc. Date: 09/25/01 Admit Date:  09/25/2001                             Operative Report  PREOPERATIVE DIAGNOSIS:  Septic metacarpal phalangeal joint left ring finger.  POSTOPERATIVE DIAGNOSIS:  Septic metacarpal phalangeal joint left ring finger.  PROCEDURE:  irrigation and debridement metacarpal phalangeal joint left finger with open packing.  SURGEON:  Artist Pais. Mina Marble, M.D.  ASSISTANT:  None.  ANESTHESIA:  General.  TOURNIQUET TIME:  15 minutes.  COMPLICATIONS:  None.  DRAINS:  None.  DESCRIPTION OF PROCEDURE:  The patient was taken to the operating room after the induction of adequate general anesthesia. The left upper extremity was prepped and draped in the usual sterile fashion.  The arm was elevated for 5 minutes and the tourniquet was inflated to 250 mmHg.  At this point in time a 3 cm. incision was made over the dorsal aspect of the left ring finger metacarpal phalangeal joint.  The incision was taken down to the skin and subcutaneous tissues.  Purulent material was identified and cultured.  The joint capsule was entered on the ulnar side where there had been some destruction of the sagittal band secondary to this infection.  The MCP joint was then debrided and a complete synovectomy was performed.  This tissue was also sent for pathologic confirmation/diagnosis and culturing.  The wound was then irrigated thoroughly with antibiotic impregnated normal saline followed by normal saline, a total of 2 liters.  It was then packed open with a 1 x 8 xeroform into the joint into the  ______ tissue and the middle of the incision was closed with 1 4-0 nylon suture to approximate the skin edges.  It was then placed in a  sterile dressing of 4 x 4s, fluff, and a compressive Coban wrap.  The patient tolerated the procedure well and went to the recovery room in stable fashion. Dictated by:   Artist Pais Mina Marble, M.D. Attending Physician:  Marlowe Shores DD:  09/25/01 TD:  09/26/01 Job: [id number]
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
## 2010-10-08 NOTE — Op Note (Signed)
[facility]  Patient:    [name]                       MRN: [id number] Proc. Date: 06/21/99 Adm. Date:  [id number] Attending:  Brandy Hale                           Operative Report  PREOPERATIVE DIAGNOSIS:  Right inguinal hernia.  POSTOPERATIVE DIAGNOSIS:  Right inguinal hernia.  PROCEDURE:  Repair of right inguinal hernia with mesh.  SURGEON:  Angelia Mould. Derrell Lolling, M.D.  OPERATIVE INDICATIONS:  This is a 55 year old black man, who has had a right groin mass and some right groin pain for some time.  It has been getting larger and more painful lately when he does heavy lifting.  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  Examination reveals a moderate size right inguinal hernia, which is completely reducible.  No evidence of hernia on the left.  He s brought to the operating room electively for repair of his right inguinal hernia.  OPERATIVE TECHNIQUE:  Patient was placed supine on the operating table.  He was  monitored and sedated by the anesthesia department.  The right groin was prepped and draped in a sterile fashion.   Xylocaine 1% with epinephrine was used as a local infiltration anesthetic.  A transverse incision was made in the right groin. Dissection was carried down through the subcutaneous tissue exposing the aponeurosis of the external oblique.  The external oblique was incised in the direction of its fibers and retracted, opening up the external inguinal ring. he cord structures were mobilized and encircled with a Penrose drain.  Cremasteric  muscle fibers were skeletonized.  He had some weakness in the floor of the inguinal canal, but no significant direct hernia.  He had a chronically scarred in indirect hernia sac, which was dissected away from the cord structures.  There was so much intense scarring on the sac, that I chose not to try to open this, but simply reduced it back into  the preperitoneal space and I closed the external ring laterally with some interrupted sutures of 2-0 Vicryl.  The floor of the inguinal canal was then repaired and reinforced with an onlay graft of Marlex mesh.  The  mesh was cut into an appropriate shape and then sutured in place with running sutures of 2-0 Prolene.  The mesh was sutured so as to generously overlap the fascia at the pubic tubercle.  It was then sutured along the inguinal ligament inferiorly and the conjoined tendon superiorly.  The mesh was then incised laterally so as to wrap around the cord structures at the internal ring.  The tails of the mesh were overlapped laterally and the suture lines were completed and tied. A few extra sutures were placed to hold the mesh in place.  This allowed for secure repair both medially and lateral to the internal ring, but allowed an adequate opening for the cord structures.  The wound was irrigated with saline.  The external oblique was closed with a running suture of 2-0 Vicryl placing the cord structures and sensory nerves deep to the external oblique.  Scarpas fascia was  closed with 3-0 Vicryl and the skin closed with a running subcuticular suture of 4-0 Vicryl and Steri-Strips.  Clean bandages were placed and the patient taken o recovery room in stable condition.  Estimated blood loss was about 10 cc.  COMPLICATIONS:  None.  Sponge, needle and instrument counts were correct. DD:  06/21/99 TD:  06/21/99 Job: [id number]
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
## 2010-10-08 NOTE — Discharge Summary (Signed)
NAME:  [name], [name] NO.:  [id number]   MEDICAL RECORD NO.:  [id number]          PATIENT TYPE:  IPS   LOCATION:  0402                          [facility]ACILITY:  [facility]   PHYSICIAN:  Anselm Jungling, MD  DATE OF BIRTH:  1955/12/02   DATE OF ADMISSION:  07/30/2008  DATE OF DISCHARGE:  08/05/2008                               DISCHARGE SUMMARY   This was an inpatient psychiatric admission for [name], a 55 year old  single African American male.  He presented complaining that he had  relapsed on alcohol.  He had a recent mild traumatic brain injury.  He  came to [place] with a long history of polysubstance abuse, including alcohol  and cocaine, but states that he had not been doing any cocaine recently  because he did not have money for it.  He presented as agitated and  disorganized in the emergency room.  Please refer to the admission note  for further details pertaining to the symptoms, circumstances and  history that led to his hospitalization.  He was given an initial Axis I  diagnosis of schizoaffective disorder, and alcohol dependence.   MEDICAL AND LABORATORY:  The patient came to [place] with a history of HIV  positivity, COPD, and coronary artery disease.  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  He had  recently been seen by Dr. Jeral Fruit of the trauma team.  He is normally  followed as an outpatient by Dr. Philipp Deputy, of the Infectious Disease  Clinic at [facility].  There were no active medical problems during this  brief inpatient stay.  He was medically and physically and cleared in  the emergency department, and then seen again by the psychiatric nurse  practitioner.  He was continued on his usual non psychotropic regimen  that includes Senokot tablets, multivitamin, Zithromax, Prezista,  Norvir, Isentress, Epivir, Prilosec, Mepron.   HOSPITAL COURSE:  The patient was admitted to the adult inpatient  psychiatric service.  He  presented as a well-nourished, normally-  developed adult male who was calm and cooperative throughout his stay,  even though he had been agitated in the emergency department, where he  received Geodon 10 mg IM.  He was continued on a psychotropic regimen of  Abilify 10 mg daily and Celexa 20 mg daily.  These were well tolerated.  The patient showed fairly good insight and judgment during his stay and  he was able to work closely with the case manager towards a reasonable  discharge and aftercare plan.  He came to [place] as a client of the [employer].  We were in touch with them  throughout his stay, and he was ultimately referred back to them on the  sixth hospital day.   AFTERCARE:  As above.  Act Team was to resume his care on August 05, 2008.   DISCHARGE MEDICATIONS:  1. Celexa 20 mg daily.  2. Zithromax 1200 mg q.7 days, next due March 18.  3. Abilify 10 mg daily.  4. Mepron 5 mL twice  daily.  5. Prezista 600 mg b.i.d.  6. Lamivudine 300 mg daily.  7. Isentress 400 mg b.i.d.  8. Norvir 100 mg b.i.d.  9. Senokot 1 tablet daily.  10.Protonix 40 mg daily.  11.Neurontin 300 mg t.i.d.   DISCHARGE DIAGNOSES:  AXIS I:  Schizoaffective disorder not otherwise  specified and alcohol dependence, early remission.  AXIS II:  Deferred.  AXIS III:  Human immunodeficiency virus positive, history of coronary  artery disease, gastroesophageal reflux disease.  AXIS IV:  Stressors severe.  AXIS V:  GAF on discharge 55.      Anselm Jungling, MD  Electronically Signed     SPB/MEDQ  D:  08/06/2008  T:  08/06/2008  Job:  [phone]
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
## 2011-02-11 LAB — T-HELPER CELL (CD4) - (RCID CLINIC ONLY)
CD4 % Helper T Cell: 2 — ABNORMAL LOW
CD4 T Cell Abs: 20 — ABNORMAL LOW

## 2011-02-14 LAB — RAPID URINE DRUG SCREEN, HOSP PERFORMED
Amphetamines: NOT DETECTED
Barbiturates: NOT DETECTED
Benzodiazepines: NOT DETECTED
Cocaine: POSITIVE — AB
Opiates: NOT DETECTED
Tetrahydrocannabinol: NOT DETECTED

## 2011-02-14 LAB — SALICYLATE LEVEL: Salicylate Lvl: <4

## 2011-02-14 LAB — CBC
HCT: 39.7
Hemoglobin: 13
MCHC: 32.7
MCV: 91.9
Platelets: 178
RBC: 4.32
RDW: 14.3
WBC: 2.9 — ABNORMAL LOW

## 2011-02-14 LAB — BASIC METABOLIC PANEL
BUN: 11
CO2: 28
Calcium: 8.1 — ABNORMAL LOW
Chloride: 107
Creatinine, Ser: 1.06
GFR calc Af Amer: >60
GFR calc non Af Amer: >60
Glucose, Bld: 97
Potassium: 3.8
Sodium: 138

## 2011-02-14 LAB — HEPATIC FUNCTION PANEL
ALT: 42
AST: 77 — ABNORMAL HIGH
Albumin: 3.1 — ABNORMAL LOW
Alkaline Phosphatase: 58
Bilirubin, Direct: 0.1
Indirect Bilirubin: 0.5
Total Bilirubin: 0.6
Total Protein: 7.8

## 2011-02-14 LAB — DIFFERENTIAL
Basophils Absolute: 0
Basophils Relative: 0
Eosinophils Absolute: 0.2
Eosinophils Relative: 6 — ABNORMAL HIGH
Lymphocytes Relative: 29
Lymphs Abs: 0.9
Monocytes Absolute: 0.3
Monocytes Relative: 12
Neutro Abs: 1.5 — ABNORMAL LOW
Neutrophils Relative %: 52

## 2011-02-14 LAB — ACETAMINOPHEN LEVEL: Acetaminophen (Tylenol), Serum: <10 — ABNORMAL LOW

## 2011-02-14 LAB — ETHANOL: Alcohol, Ethyl (B): <5

## 2011-02-14 LAB — TRICYCLICS SCREEN, URINE: TCA Scrn: NOT DETECTED

## 2011-02-24 LAB — TYPE AND SCREEN
ABO/RH(D): A POS
Antibody Screen: NEGATIVE

## 2011-02-24 LAB — CBC
HCT: 19.4 % — ABNORMAL LOW (ref 39.0–52.0)
HCT: 19.7 % — ABNORMAL LOW (ref 39.0–52.0)
HCT: 21 % — ABNORMAL LOW (ref 39.0–52.0)
HCT: 22.4 % — ABNORMAL LOW (ref 39.0–52.0)
Hemoglobin: 6.6 g/dL — CL (ref 13.0–17.0)
Hemoglobin: 6.6 g/dL — CL (ref 13.0–17.0)
Hemoglobin: 7.4 g/dL — CL (ref 13.0–17.0)
Hemoglobin: 7.8 g/dL — CL (ref 13.0–17.0)
MCHC: 33.5 g/dL (ref 30.0–36.0)
MCHC: 33.9 g/dL (ref 30.0–36.0)
MCHC: 34.8 g/dL (ref 30.0–36.0)
MCHC: 35.5 g/dL (ref 30.0–36.0)
MCV: 91.6 fL (ref 78.0–100.0)
MCV: 92.5 fL (ref 78.0–100.0)
MCV: 92.7 fL (ref 78.0–100.0)
MCV: 92.8 fL (ref 78.0–100.0)
Platelets: 254 10*3/uL (ref 150–400)
Platelets: 270 10*3/uL (ref 150–400)
Platelets: 288 10*3/uL (ref 150–400)
Platelets: 344 10*3/uL (ref 150–400)
RBC: 2.1 MIL/uL — ABNORMAL LOW (ref 4.22–5.81)
RBC: 2.12 MIL/uL — ABNORMAL LOW (ref 4.22–5.81)
RBC: 2.29 MIL/uL — ABNORMAL LOW (ref 4.22–5.81)
RBC: 2.42 MIL/uL — ABNORMAL LOW (ref 4.22–5.81)
RDW: 14.7 % (ref 11.5–15.5)
RDW: 15.1 % (ref 11.5–15.5)
RDW: 15.2 % (ref 11.5–15.5)
RDW: 15.6 % — ABNORMAL HIGH (ref 11.5–15.5)
WBC: 1.5 10*3/uL — ABNORMAL LOW (ref 4.0–10.5)
WBC: 1.8 10*3/uL — ABNORMAL LOW (ref 4.0–10.5)
WBC: 2 10*3/uL — ABNORMAL LOW (ref 4.0–10.5)
WBC: 2.3 10*3/uL — ABNORMAL LOW (ref 4.0–10.5)

## 2011-02-24 LAB — POCT I-STAT, CHEM 8
BUN: 9 mg/dL (ref 6–23)
Calcium, Ion: 1.14 mmol/L (ref 1.12–1.32)
Chloride: 101 mEq/L (ref 96–112)
Creatinine, Ser: 1 mg/dL (ref 0.4–1.5)
Glucose, Bld: 127 mg/dL — ABNORMAL HIGH (ref 70–99)
HCT: 22 % — ABNORMAL LOW (ref 39.0–52.0)
Hemoglobin: 7.5 g/dL — CL (ref 13.0–17.0)
Potassium: 4.1 mEq/L (ref 3.5–5.1)
Sodium: 137 mEq/L (ref 135–145)
TCO2: 24 mmol/L (ref 0–100)

## 2011-02-24 LAB — DIFFERENTIAL
Basophils Absolute: 0 10*3/uL (ref 0.0–0.1)
Basophils Absolute: 0 10*3/uL (ref 0.0–0.1)
Basophils Relative: 1 % (ref 0–1)
Basophils Relative: 1 % (ref 0–1)
Eosinophils Absolute: 0 10*3/uL (ref 0.0–0.7)
Eosinophils Absolute: 0.1 10*3/uL (ref 0.0–0.7)
Eosinophils Relative: 2 % (ref 0–5)
Eosinophils Relative: 4 % (ref 0–5)
Lymphocytes Relative: 21 % (ref 12–46)
Lymphocytes Relative: 26 % (ref 12–46)
Lymphs Abs: 0.4 10*3/uL — ABNORMAL LOW (ref 0.7–4.0)
Lymphs Abs: 0.6 10*3/uL — ABNORMAL LOW (ref 0.7–4.0)
Monocytes Absolute: 0.3 10*3/uL (ref 0.1–1.0)
Monocytes Absolute: 0.4 10*3/uL (ref 0.1–1.0)
Monocytes Relative: 15 % — ABNORMAL HIGH (ref 3–12)
Monocytes Relative: 19 % — ABNORMAL HIGH (ref 3–12)
Neutro Abs: 1.1 10*3/uL — ABNORMAL LOW (ref 1.7–7.7)
Neutro Abs: 1.2 10*3/uL — ABNORMAL LOW (ref 1.7–7.7)
Neutrophils Relative %: 52 % (ref 43–77)
Neutrophils Relative %: 62 % (ref 43–77)

## 2011-02-24 LAB — COMPREHENSIVE METABOLIC PANEL
ALT: 146 U/L — ABNORMAL HIGH (ref 0–53)
ALT: 198 U/L — ABNORMAL HIGH (ref 0–53)
AST: 118 U/L — ABNORMAL HIGH (ref 0–37)
AST: 204 U/L — ABNORMAL HIGH (ref 0–37)
Albumin: 2.9 g/dL — ABNORMAL LOW (ref 3.5–5.2)
Albumin: 3.1 g/dL — ABNORMAL LOW (ref 3.5–5.2)
Alkaline Phosphatase: 57 U/L (ref 39–117)
Alkaline Phosphatase: 59 U/L (ref 39–117)
BUN: 5 mg/dL — ABNORMAL LOW (ref 6–23)
BUN: 7 mg/dL (ref 6–23)
CO2: 25 mEq/L (ref 19–32)
CO2: 29 mEq/L (ref 19–32)
Calcium: 8.9 mg/dL (ref 8.4–10.5)
Calcium: 9.2 mg/dL (ref 8.4–10.5)
Chloride: 105 mEq/L (ref 96–112)
Chloride: 106 mEq/L (ref 96–112)
Creatinine, Ser: 0.74 mg/dL (ref 0.4–1.5)
Creatinine, Ser: 1.03 mg/dL (ref 0.4–1.5)
GFR calc Af Amer: >60 mL/min (ref >60)
GFR calc Af Amer: >60 mL/min (ref >60)
GFR calc non Af Amer: >60 mL/min (ref >60)
GFR calc non Af Amer: >60 mL/min (ref >60)
Glucose, Bld: 109 mg/dL — ABNORMAL HIGH (ref 70–99)
Glucose, Bld: 92 mg/dL (ref 70–99)
Potassium: 3.5 mEq/L (ref 3.5–5.1)
Potassium: 3.8 mEq/L (ref 3.5–5.1)
Sodium: 137 mEq/L (ref 135–145)
Sodium: 139 mEq/L (ref 135–145)
Total Bilirubin: 0.6 mg/dL (ref 0.3–1.2)
Total Bilirubin: 1.3 mg/dL — ABNORMAL HIGH (ref 0.3–1.2)
Total Protein: 7.4 g/dL (ref 6.0–8.3)
Total Protein: 8 g/dL (ref 6.0–8.3)

## 2011-02-24 LAB — T-HELPER CELLS (CD4) COUNT (NOT AT ARMC)
CD4 % Helper T Cell: 6 % — ABNORMAL LOW (ref 33–55)
CD4 T Cell Abs: 30 uL — ABNORMAL LOW (ref 400–2700)

## 2011-02-24 LAB — LIPID PANEL
Cholesterol: 92 mg/dL (ref 0–200)
HDL: 32 mg/dL — ABNORMAL LOW (ref >39)
LDL Cholesterol: 47 mg/dL (ref 0–99)
Total CHOL/HDL Ratio: 2.9 RATIO
Triglycerides: 63 mg/dL (ref <150)
VLDL: 13 mg/dL (ref 0–40)

## 2011-02-24 LAB — ABO/RH: ABO/RH(D): A POS

## 2011-02-24 LAB — RETICULOCYTES
RBC.: 2.32 MIL/uL — ABNORMAL LOW (ref 4.22–5.81)
Retic Count, Absolute: 7 10*3/uL — ABNORMAL LOW (ref 19.0–186.0)
Retic Ct Pct: 0.3 % — ABNORMAL LOW (ref 0.4–3.1)

## 2011-02-24 LAB — TECHNOLOGIST SMEAR REVIEW

## 2011-02-24 LAB — BASIC METABOLIC PANEL
BUN: 6 mg/dL (ref 6–23)
CO2: 27 mEq/L (ref 19–32)
Calcium: 8.9 mg/dL (ref 8.4–10.5)
Chloride: 101 mEq/L (ref 96–112)
Creatinine, Ser: 0.69 mg/dL (ref 0.4–1.5)
GFR calc Af Amer: >60 mL/min (ref >60)
GFR calc non Af Amer: >60 mL/min (ref >60)
Glucose, Bld: 109 mg/dL — ABNORMAL HIGH (ref 70–99)
Potassium: 3.8 mEq/L (ref 3.5–5.1)
Sodium: 134 mEq/L — ABNORMAL LOW (ref 135–145)

## 2011-02-24 LAB — PREPARE RBC (CROSSMATCH)

## 2011-02-24 LAB — HEMOGLOBIN AND HEMATOCRIT, BLOOD
HCT: 20.7 % — ABNORMAL LOW (ref 39.0–52.0)
HCT: 21.9 % — ABNORMAL LOW (ref 39.0–52.0)
HCT: 22.1 % — ABNORMAL LOW (ref 39.0–52.0)
HCT: 22.2 % — ABNORMAL LOW (ref 39.0–52.0)
Hemoglobin: 7.2 g/dL — CL (ref 13.0–17.0)
Hemoglobin: 7.5 g/dL — CL (ref 13.0–17.0)
Hemoglobin: 7.7 g/dL — CL (ref 13.0–17.0)
Hemoglobin: 7.8 g/dL — CL (ref 13.0–17.0)

## 2011-02-24 LAB — FERRITIN: Ferritin: 3579 ng/mL — ABNORMAL HIGH (ref 22–322)

## 2011-02-24 LAB — FOLATE RBC: RBC Folate: 281 ng/mL (ref 180–600)

## 2011-02-24 LAB — LACTATE DEHYDROGENASE: LDH: 186 U/L (ref 94–250)

## 2011-02-24 LAB — TRANSFERRIN: Transferrin: 193 mg/dL — ABNORMAL LOW (ref 212–360)

## 2011-02-24 LAB — CK TOTAL AND CKMB (NOT AT ARMC)
CK, MB: 0.5 ng/mL (ref 0.3–4.0)
CK, MB: 0.6 ng/mL (ref 0.3–4.0)
Relative Index: INVALID (ref 0.0–2.5)
Relative Index: INVALID (ref 0.0–2.5)
Total CK: 62 U/L (ref 7–232)
Total CK: 75 U/L (ref 7–232)

## 2011-02-24 LAB — IRON AND TIBC
Iron: 246 ug/dL — ABNORMAL HIGH (ref 42–135)
UIBC: 19 ug/dL

## 2011-02-24 LAB — TROPONIN I: Troponin I: 0.02 ng/mL (ref 0.00–0.06)

## 2011-02-24 LAB — VITAMIN B12: Vitamin B-12: 494 pg/mL (ref 211–911)

## 2011-02-24 LAB — TSH: TSH: 2.534 u[IU]/mL (ref 0.350–4.500)

## 2011-02-24 LAB — PROTIME-INR
INR: 1.3 (ref 0.00–1.49)
Prothrombin Time: 16.2 seconds — ABNORMAL HIGH (ref 11.6–15.2)

## 2011-02-24 LAB — PHOSPHORUS: Phosphorus: 4.1 mg/dL (ref 2.3–4.6)

## 2011-02-24 LAB — FIBRINOGEN: Fibrinogen: 289 mg/dL (ref 204–475)

## 2011-02-24 LAB — APTT: aPTT: 37 seconds (ref 24–37)

## 2011-02-24 LAB — MAGNESIUM: Magnesium: 2 mg/dL (ref 1.5–2.5)

## 2011-03-01 LAB — ETHANOL
Alcohol, Ethyl (B): 189 — ABNORMAL HIGH
Alcohol, Ethyl (B): 74 — ABNORMAL HIGH

## 2011-03-02 LAB — COMPREHENSIVE METABOLIC PANEL
ALT: 89 — ABNORMAL HIGH
AST: 140 — ABNORMAL HIGH
Albumin: 3.1 — ABNORMAL LOW
Alkaline Phosphatase: 75
BUN: 12
CO2: 23
Calcium: 9.2
Chloride: 101
Creatinine, Ser: 1.09
GFR calc Af Amer: >60
GFR calc non Af Amer: >60
Glucose, Bld: 90
Potassium: 3.7
Sodium: 135
Total Bilirubin: 0.9
Total Protein: 9.2 — ABNORMAL HIGH

## 2011-03-02 LAB — DIFFERENTIAL
Basophils Absolute: 0
Basophils Relative: 0
Eosinophils Absolute: 0.1
Eosinophils Relative: 1
Lymphocytes Relative: 17
Lymphs Abs: 1
Monocytes Absolute: 0.5
Monocytes Relative: 9
Neutro Abs: 4.5
Neutrophils Relative %: 73

## 2011-03-02 LAB — RAPID URINE DRUG SCREEN, HOSP PERFORMED
Amphetamines: NOT DETECTED
Barbiturates: NOT DETECTED
Benzodiazepines: NOT DETECTED
Cocaine: POSITIVE — AB
Opiates: NOT DETECTED
Tetrahydrocannabinol: NOT DETECTED

## 2011-03-02 LAB — CBC
HCT: 39.6
Hemoglobin: 13.2
MCHC: 33.4
MCV: 95
Platelets: 336
RBC: 4.17 — ABNORMAL LOW
RDW: 12.8
WBC: 6.1

## 2011-03-02 LAB — ACETAMINOPHEN LEVEL: Acetaminophen (Tylenol), Serum: <10 — ABNORMAL LOW

## 2011-03-02 LAB — ETHANOL
Alcohol, Ethyl (B): 164 — ABNORMAL HIGH
Alcohol, Ethyl (B): 215 — ABNORMAL HIGH

## 2011-03-02 LAB — SALICYLATE LEVEL: Salicylate Lvl: <4

## 2011-03-03 LAB — URINALYSIS, MICROSCOPIC ONLY
Bilirubin Urine: NEGATIVE
Glucose, UA: NEGATIVE
Ketones, ur: NEGATIVE
Leukocytes, UA: NEGATIVE
Nitrite: NEGATIVE
Protein, ur: NEGATIVE
Specific Gravity, Urine: 1.01
Urine-Other: NONE SEEN
Urobilinogen, UA: 0.2
pH: 5.5

## 2011-03-03 LAB — DIFFERENTIAL
Basophils Absolute: 0
Basophils Relative: 1
Eosinophils Absolute: 0.4
Eosinophils Relative: 7 — ABNORMAL HIGH
Lymphocytes Relative: 15
Lymphs Abs: 0.9
Monocytes Absolute: 0.6
Monocytes Relative: 10
Neutro Abs: 4.1
Neutrophils Relative %: 67

## 2011-03-03 LAB — CULTURE, BLOOD (ROUTINE X 2)
Culture: NO GROWTH
Culture: NO GROWTH

## 2011-03-03 LAB — BASIC METABOLIC PANEL
BUN: 14
BUN: 15
BUN: 17
BUN: 3 — ABNORMAL LOW
BUN: 5 — ABNORMAL LOW
BUN: 8
CO2: 24
CO2: 24
CO2: 25
CO2: 26
CO2: 27
CO2: 29
Calcium: 8.4
Calcium: 8.7
Calcium: 8.7
Calcium: 8.9
Calcium: 9.1
Calcium: 9.3
Chloride: 100
Chloride: 101
Chloride: 103
Chloride: 103
Chloride: 105
Chloride: 111
Creatinine, Ser: 0.72
Creatinine, Ser: 0.79
Creatinine, Ser: 0.93
Creatinine, Ser: 0.95
Creatinine, Ser: 1.01
Creatinine, Ser: 1.04
GFR calc Af Amer: >60
GFR calc Af Amer: >60
GFR calc Af Amer: >60
GFR calc Af Amer: >60
GFR calc Af Amer: >60
GFR calc Af Amer: >60
GFR calc non Af Amer: >60
GFR calc non Af Amer: >60
GFR calc non Af Amer: >60
GFR calc non Af Amer: >60
GFR calc non Af Amer: >60
GFR calc non Af Amer: >60
Glucose, Bld: 101 — ABNORMAL HIGH
Glucose, Bld: 106 — ABNORMAL HIGH
Glucose, Bld: 115 — ABNORMAL HIGH
Glucose, Bld: 129 — ABNORMAL HIGH
Glucose, Bld: 130 — ABNORMAL HIGH
Glucose, Bld: 95
Potassium: 3.3 — ABNORMAL LOW
Potassium: 3.4 — ABNORMAL LOW
Potassium: 3.8
Potassium: 3.9
Potassium: 4.2
Potassium: 4.2
Sodium: 130 — ABNORMAL LOW
Sodium: 133 — ABNORMAL LOW
Sodium: 136
Sodium: 138
Sodium: 139
Sodium: 141

## 2011-03-03 LAB — COMPREHENSIVE METABOLIC PANEL
ALT: 79 — ABNORMAL HIGH
AST: 111 — ABNORMAL HIGH
Albumin: 2.7 — ABNORMAL LOW
Alkaline Phosphatase: 48
BUN: 4 — ABNORMAL LOW
CO2: 25
Calcium: 8 — ABNORMAL LOW
Chloride: 106
Creatinine, Ser: 0.82
GFR calc Af Amer: >60
GFR calc non Af Amer: >60
Glucose, Bld: 101 — ABNORMAL HIGH
Potassium: 3.4 — ABNORMAL LOW
Sodium: 138
Total Bilirubin: 0.9
Total Protein: 6.9

## 2011-03-03 LAB — CBC
HCT: 36.3 — ABNORMAL LOW
HCT: 38.2 — ABNORMAL LOW
HCT: 38.6 — ABNORMAL LOW
HCT: 42.6
HCT: 43.5
Hemoglobin: 12.2 — ABNORMAL LOW
Hemoglobin: 13
Hemoglobin: 13.1
Hemoglobin: 14.4
Hemoglobin: 14.8
MCHC: 33.5
MCHC: 33.7
MCHC: 34
MCHC: 34
MCHC: 34
MCV: 94.4
MCV: 94.5
MCV: 94.6
MCV: 94.6
MCV: 95.1
Platelets: 126 — ABNORMAL LOW
Platelets: 133 — ABNORMAL LOW
Platelets: 143 — ABNORMAL LOW
Platelets: 143 — ABNORMAL LOW
Platelets: 170
RBC: 3.82 — ABNORMAL LOW
RBC: 4.04 — ABNORMAL LOW
RBC: 4.08 — ABNORMAL LOW
RBC: 4.51
RBC: 4.61
RDW: 12.8
RDW: 13.1
RDW: 13.1
RDW: 13.3
RDW: 13.4
WBC: 3.3 — ABNORMAL LOW
WBC: 4.9
WBC: 6.1
WBC: 7.2
WBC: 8.3

## 2011-03-03 LAB — SALICYLATE LEVEL: Salicylate Lvl: <4

## 2011-03-03 LAB — CK TOTAL AND CKMB (NOT AT ARMC)
CK, MB: 4.4 — ABNORMAL HIGH
Relative Index: 0.2
Total CK: 1778 — ABNORMAL HIGH

## 2011-03-03 LAB — RAPID URINE DRUG SCREEN, HOSP PERFORMED
Amphetamines: NOT DETECTED
Barbiturates: NOT DETECTED
Benzodiazepines: NOT DETECTED
Cocaine: POSITIVE — AB
Opiates: NOT DETECTED
Tetrahydrocannabinol: NOT DETECTED

## 2011-03-03 LAB — ETHANOL: Alcohol, Ethyl (B): 171 — ABNORMAL HIGH

## 2011-03-03 LAB — TSH: TSH: 0.71

## 2011-03-03 LAB — ACETAMINOPHEN LEVEL: Acetaminophen (Tylenol), Serum: <10 — ABNORMAL LOW

## 2011-03-03 LAB — HIV-1 GENOTYPR PLUS

## 2011-03-03 LAB — HIV-1 RNA, QUALITATIVE, TMA: HIV-1 RNA, Qualitative, TMA: UNDETERMINED

## 2011-03-03 LAB — TROPONIN I: Troponin I: 0.03

## 2011-03-03 LAB — HIV-1 RNA ULTRAQUANT REFLEX TO GENTYP+
HIV 1 RNA Quant: 919 copies/mL — ABNORMAL HIGH (ref <50)
HIV-1 RNA Quant, Log: 2.96 — ABNORMAL HIGH (ref <1.70)

## 2011-03-03 LAB — T-HELPER CELLS (CD4) COUNT (NOT AT ARMC)
CD4 % Helper T Cell: 9 — ABNORMAL LOW
CD4 T Cell Abs: 40 — ABNORMAL LOW

## 2011-03-08 LAB — T-HELPER CELL (CD4) - (RCID CLINIC ONLY)
CD4 % Helper T Cell: 9 — ABNORMAL LOW
CD4 T Cell Abs: 90 — ABNORMAL LOW

## 2011-08-27 ENCOUNTER — Encounter: Payer: Self-pay | Admitting: Infectious Disease

## 2011-08-30 ENCOUNTER — Telehealth: Payer: Self-pay | Admitting: *Deleted
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
## 2011-08-30 NOTE — Telephone Encounter (Signed)
Perfect. He will need fu next week or 2

## 2011-08-30 NOTE — Telephone Encounter (Signed)
I gave him the info for his visit with Tammy. He is going to stop by here this week to sign a release so we have info from the prison.
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
## 2011-08-30 NOTE — Telephone Encounter (Signed)
Former pt.  just got out of prison. Wants to re-establish care. To office manger to see if he needs a new intake or can just make him an appt.  C/o kidney problems. On antibiotics. Wants to see Dr. Philipp Deputy. Told him she is at [facility]. Gave him that number so he can get care there. We will see him for the HIV  [phone] servant center. You have to ask someone to get him to the phone. I told him I will call with info about his appt here

## 2011-09-06 ENCOUNTER — Other Ambulatory Visit: Payer: Self-pay | Admitting: Infectious Disease

## 2011-09-06 ENCOUNTER — Ambulatory Visit (INDEPENDENT_AMBULATORY_CARE_PROVIDER_SITE_OTHER): Payer: Self-pay

## 2011-09-06 DIAGNOSIS — W3400XA Accidental discharge from unspecified firearms or gun, initial encounter: Secondary | ICD-10-CM

## 2011-09-06 DIAGNOSIS — K219 Gastro-esophageal reflux disease without esophagitis: Secondary | ICD-10-CM

## 2011-09-06 DIAGNOSIS — D696 Thrombocytopenia, unspecified: Secondary | ICD-10-CM

## 2011-09-06 DIAGNOSIS — F329 Major depressive disorder, single episode, unspecified: Secondary | ICD-10-CM

## 2011-09-06 DIAGNOSIS — S31139A Puncture wound of abdominal wall without foreign body, unspecified quadrant without penetration into peritoneal cavity, initial encounter: Secondary | ICD-10-CM

## 2011-09-06 DIAGNOSIS — Z113 Encounter for screening for infections with a predominantly sexual mode of transmission: Secondary | ICD-10-CM

## 2011-09-06 DIAGNOSIS — B20 Human immunodeficiency virus [HIV] disease: Secondary | ICD-10-CM

## 2011-09-06 LAB — COMPLETE METABOLIC PANEL WITH GFR
ALT: 11 U/L (ref 0–53)
AST: 30 U/L (ref 0–37)
Albumin: 2.8 g/dL — ABNORMAL LOW (ref 3.5–5.2)
Alkaline Phosphatase: 52 U/L (ref 39–117)
BUN: 12 mg/dL (ref 6–23)
CO2: 25 mEq/L (ref 19–32)
Calcium: 8.4 mg/dL (ref 8.4–10.5)
Chloride: 106 mEq/L (ref 96–112)
Creat: 1.19 mg/dL (ref 0.50–1.35)
GFR, Est African American: 77 mL/min
GFR, Est Non African American: 67 mL/min
Glucose, Bld: 87 mg/dL (ref 70–99)
Potassium: 3.6 mEq/L (ref 3.5–5.3)
Sodium: 138 mEq/L (ref 135–145)
Total Bilirubin: 0.6 mg/dL (ref 0.3–1.2)
Total Protein: 7.8 g/dL (ref 6.0–8.3)

## 2011-09-06 LAB — CBC WITH DIFFERENTIAL/PLATELET
Basophils Absolute: 0 10*3/uL (ref 0.0–0.1)
Basophils Relative: 0 % (ref 0–1)
Eosinophils Absolute: 0.2 10*3/uL (ref 0.0–0.7)
Eosinophils Relative: 5 % (ref 0–5)
HCT: 29.7 % — ABNORMAL LOW (ref 39.0–52.0)
Hemoglobin: 9.4 g/dL — ABNORMAL LOW (ref 13.0–17.0)
Lymphocytes Relative: 16 % (ref 12–46)
Lymphs Abs: 0.7 10*3/uL (ref 0.7–4.0)
MCH: 30.5 pg (ref 26.0–34.0)
MCHC: 31.6 g/dL (ref 30.0–36.0)
MCV: 96.4 fL (ref 78.0–100.0)
Monocytes Absolute: 0.5 10*3/uL (ref 0.1–1.0)
Monocytes Relative: 11 % (ref 3–12)
Neutro Abs: 3.2 10*3/uL (ref 1.7–7.7)
Neutrophils Relative %: 68 % (ref 43–77)
Platelets: 176 10*3/uL (ref 150–400)
RBC: 3.08 MIL/uL — ABNORMAL LOW (ref 4.22–5.81)
RDW: 14.4 % (ref 11.5–15.5)
WBC: 4.7 10*3/uL (ref 4.0–10.5)

## 2011-09-06 LAB — LIPID PANEL
Cholesterol: 89 mg/dL (ref 0–200)
HDL: 30 mg/dL — ABNORMAL LOW (ref >39)
LDL Cholesterol: 38 mg/dL (ref 0–99)
Total CHOL/HDL Ratio: 3 Ratio
Triglycerides: 106 mg/dL (ref <150)
VLDL: 21 mg/dL (ref 0–40)

## 2011-09-06 LAB — HEPATITIS B SURFACE ANTIGEN: Hepatitis B Surface Ag: NEGATIVE

## 2011-09-06 LAB — HEPATITIS B SURFACE ANTIBODY,QUALITATIVE: Hep B S Ab: NEGATIVE

## 2011-09-06 LAB — HEPATITIS C ANTIBODY: HCV Ab: REACTIVE — AB

## 2011-09-07 DIAGNOSIS — S31139A Puncture wound of abdominal wall without foreign body, unspecified quadrant without penetration into peritoneal cavity, initial encounter: Secondary | ICD-10-CM

## 2011-09-07 DIAGNOSIS — W3400XA Accidental discharge from unspecified firearms or gun, initial encounter: Secondary | ICD-10-CM | POA: Insufficient documentation

## 2011-09-07 HISTORY — DX: Puncture wound of abdominal wall without foreign body, unspecified quadrant without penetration into peritoneal cavity, initial encounter: S31.139A

## 2011-09-07 LAB — T-HELPER CELL (CD4) - (RCID CLINIC ONLY)
CD4 % Helper T Cell: 12 % — ABNORMAL LOW (ref 33–55)
CD4 T Cell Abs: 80 uL — ABNORMAL LOW (ref 400–2700)

## 2011-09-07 LAB — URINALYSIS
Bilirubin Urine: NEGATIVE
Glucose, UA: NEGATIVE mg/dL
Ketones, ur: NEGATIVE mg/dL
Leukocytes, UA: NEGATIVE
Nitrite: NEGATIVE
Protein, ur: NEGATIVE mg/dL
Specific Gravity, Urine: 1.012 (ref 1.005–1.030)
Urobilinogen, UA: 1 mg/dL (ref 0.0–1.0)
pH: 6 (ref 5.0–8.0)

## 2011-09-07 LAB — HEPATITIS A ANTIBODY, TOTAL: Hep A Total Ab: POSITIVE — AB

## 2011-09-07 LAB — HEPATITIS B CORE ANTIBODY, TOTAL: Hep B Core Total Ab: NEGATIVE

## 2011-09-07 LAB — RPR
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
## 2011-09-08 ENCOUNTER — Telehealth: Payer: Self-pay

## 2011-09-08 LAB — TB SKIN TEST
Induration: 0
TB Skin Test: NEGATIVE mm

## 2011-09-08 LAB — HIV-1 RNA ULTRAQUANT REFLEX TO GENTYP+
HIV 1 RNA Quant: <20 copies/mL (ref <20)
HIV-1 RNA Quant, Log: <1.3 {Log} (ref <1.30)

## 2011-09-08 NOTE — Telephone Encounter (Signed)
TB skin test faxed to [facility] .  Merrilee Seashore, RN    [phone]  Laurell Josephs, RN

## 2011-09-21 ENCOUNTER — Ambulatory Visit: Payer: Self-pay

## 2011-09-21 ENCOUNTER — Ambulatory Visit (INDEPENDENT_AMBULATORY_CARE_PROVIDER_SITE_OTHER): Payer: Self-pay | Admitting: Infectious Disease

## 2011-09-21 ENCOUNTER — Encounter: Payer: Self-pay | Admitting: Infectious Disease

## 2011-09-21 VITALS — BP 138/103 | HR 74 | Temp 98.2°F | Ht 72.0 in | Wt 211.0 lb

## 2011-09-21 DIAGNOSIS — F329 Major depressive disorder, single episode, unspecified: Secondary | ICD-10-CM

## 2011-09-21 DIAGNOSIS — K219 Gastro-esophageal reflux disease without esophagitis: Secondary | ICD-10-CM

## 2011-09-21 DIAGNOSIS — N19 Unspecified kidney failure: Secondary | ICD-10-CM | POA: Insufficient documentation

## 2011-09-21 DIAGNOSIS — B20 Human immunodeficiency virus [HIV] disease: Secondary | ICD-10-CM

## 2011-09-21 DIAGNOSIS — F1420 Cocaine dependence, uncomplicated: Secondary | ICD-10-CM

## 2011-09-21 DIAGNOSIS — B182 Chronic viral hepatitis C: Secondary | ICD-10-CM

## 2011-09-21 DIAGNOSIS — R7881 Bacteremia: Secondary | ICD-10-CM

## 2011-09-21 DIAGNOSIS — F1010 Alcohol abuse, uncomplicated: Secondary | ICD-10-CM

## 2011-09-21 DIAGNOSIS — N529 Male erectile dysfunction, unspecified: Secondary | ICD-10-CM

## 2011-09-21 DIAGNOSIS — D696 Thrombocytopenia, unspecified: Secondary | ICD-10-CM

## 2011-09-21 DIAGNOSIS — A4902 Methicillin resistant Staphylococcus aureus infection, unspecified site: Secondary | ICD-10-CM

## 2011-09-21 DIAGNOSIS — F319 Bipolar disorder, unspecified: Secondary | ICD-10-CM

## 2011-09-21 MED ORDER — ATOVAQUONE 750 MG/5ML PO SUSP: 1500.0000 mg | Freq: Every day | ORAL | Status: DC

## 2011-09-21 MED ORDER — RALTEGRAVIR POTASSIUM 400 MG PO TABS: 400.0000 mg | ORAL_TABLET | Freq: Two times a day (BID) | ORAL | Status: DC

## 2011-09-21 MED ORDER — RISPERIDONE 2 MG PO TABS: 2.0000 mg | ORAL_TABLET | Freq: Every day | ORAL | Status: DC

## 2011-09-21 MED ORDER — AZITHROMYCIN 600 MG PO TABS: 1200.0000 mg | ORAL_TABLET | Freq: Every day | ORAL | Status: DC

## 2011-09-21 MED ORDER — SILDENAFIL CITRATE 50 MG PO TABS: 50.0000 mg | ORAL_TABLET | ORAL | Status: DC

## 2011-09-21 MED ORDER — CITALOPRAM HYDROBROMIDE 20 MG PO TABS: 20.0000 mg | ORAL_TABLET | Freq: Every day | ORAL | Status: DC

## 2011-09-21 MED ORDER — URSODIOL 300 MG PO CAPS: 300.0000 mg | ORAL_CAPSULE | Freq: Three times a day (TID) | ORAL | Status: DC

## 2011-09-21 MED ORDER — RITONAVIR 100 MG PO TABS: 100.0000 mg | ORAL_TABLET | Freq: Two times a day (BID) | ORAL | Status: DC

## 2011-09-21 MED ORDER — GABAPENTIN 100 MG PO CAPS: 100.0000 mg | ORAL_CAPSULE | Freq: Three times a day (TID) | ORAL | Status: DC

## 2011-09-21 MED ORDER — ABACAVIR SULFATE-LAMIVUDINE 600-300 MG PO TABS: 1.0000 | ORAL_TABLET | Freq: Every day | ORAL | Status: DC

## 2011-09-21 MED ORDER — DARUNAVIR ETHANOLATE 600 MG PO TABS: 600.0000 mg | ORAL_TABLET | ORAL | Status: DC
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
## 2011-09-21 NOTE — Assessment & Plan Note (Signed)
Not clear what the source of this was he received 3 weeks of IV vancomycin and then developed renal toxicity. He does not have obvious evidence of recurrence of infection. Again I might get records from [facility].

## 2011-09-21 NOTE — Progress Notes (Signed)
Subjective:    Patient ID: [name], male    DOB: Sep 16, 1955, 56 y.o.   MRN: [id number]  HPI  56 year old [place] American man with history of HIV/AIDs and Hep C coinfection,  for greater than 20 years previously cared for by Dr. Philipp Deputy at [facility]. He was incarcerated for 2 years and was just recently released from prison. He had been admitted to [facility] for MRSA bacteremia that was complicated by acute renal failure while on vancomyinc. His Tenofovir was also stopped and he was changed to twice daily prezista 600mg  with norvir 100mg  bid, emtricitabine every 48 hours and twice daily isentress. Dr. Lawanda Cousins had wished to place the pt on abacavir as well but had been waiting for HLA [id number] to come back (it came back negative). Pt was being dc to follow up for HIV care and arrangements were being made for him to followup with his psychiatrist Dr. Ottie Glazier for HIV and primary care. Dr. Cliffton Asters contacted me and I was able to get him into our intake clinic. Didn't take his viral load was undetectable and CD4 count was 80. His renal failure seems to completely resolve and he now has a normal serum creatinine. He is about to run out of his 30 day supply of his antiretrovirals and as needed a prescription is for these as well as medicines for his bipolar disorder. Looking through his labs it appears that he should be up to be placed on a regimen of once daily Epzicom, along with continuing his twice daily prezista with twice-daily boosting with norvir and his twice daily isentress. Do not have genotype some prison, would like to obtain these and in conjunction with his genotype some common potentially simplify his antiretroviral regimen. 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 For now I have sent an electronic prescription to the local pharmacy at [facility] on [address] as well as having died printed prescriptions for  the patient. He is engaged in a monogamous relationship with a girlfriend of many years who is in planned on marrying him. He states that she is HIV negative and claims that they use condoms with all forms of sexual intercourse. He did ask for a prescription for what for Viagra today I told him I would be okay to fill this for his erectile dysfunction provided he continued to keep his virus suppressed and he continues condoms. I spent greater than 60 minutes with the patient including greater than 50% of time in face to face counsel of the patient and in coordination of their care.    Review of Systems  Constitutional: Negative for fever, chills, diaphoresis, activity change, appetite change, fatigue and unexpected weight change.  HENT: Negative for congestion, sore throat, rhinorrhea, sneezing, trouble swallowing and sinus pressure.   Eyes: Negative for photophobia and visual disturbance.  Respiratory: Negative for cough, chest tightness, shortness of breath, wheezing and stridor.   Cardiovascular: Negative for chest pain, palpitations and leg swelling.  Gastrointestinal: Negative for nausea, vomiting, abdominal pain, diarrhea, constipation, blood in stool, abdominal distention and anal bleeding.  Genitourinary: Negative for dysuria, hematuria, flank pain and difficulty urinating.  Musculoskeletal: Negative for myalgias, back pain, joint swelling, arthralgias and gait problem.  Skin: Negative for color change, pallor, rash and wound.  Neurological: Negative for dizziness, tremors, weakness and light-headedness.  Hematological: Negative for adenopathy. Does not bruise/bleed easily.  Psychiatric/Behavioral: Negative for behavioral  problems, confusion, sleep disturbance, dysphoric mood, decreased concentration and agitation.       Objective:   Physical Exam  Constitutional: He is oriented to person, place, and time. He appears well-developed and well-nourished. No distress.  HENT:  Head:  Normocephalic and atraumatic.  Mouth/Throat: Oropharynx is clear and moist. No oropharyngeal exudate.  Eyes: Conjunctivae and EOM are normal. Pupils are equal, round, and reactive to light. No scleral icterus.  Neck: Normal range of motion. Neck supple. No JVD present.  Cardiovascular: Normal rate, regular rhythm and normal heart sounds.  Exam reveals no gallop and no friction rub.   No murmur heard. Pulmonary/Chest: Effort normal and breath sounds normal. No respiratory distress. He has no wheezes. He has no rales. He exhibits no tenderness.  Abdominal: He exhibits no distension and no mass. There is no tenderness. There is no rebound and no guarding.  Musculoskeletal: He exhibits no edema and no tenderness.  Lymphadenopathy:    He has no cervical adenopathy.  Neurological: He is alert and oriented to person, place, and time. He has normal reflexes. He exhibits normal muscle tone. Coordination normal.  Skin: Skin is warm and dry. He is not diaphoretic. No erythema. No pallor.  Psychiatric: He has a normal mood and affect. His behavior is normal. Judgment and thought content normal.          Assessment & Plan:  HIV DISEASE I would like to have records from prison sent here in particular his genotype is obtained in prison I would like to review those genotypes along with the previous genotype is obtained in our clinic and potentially simplify his anti-retroviral regimen. For now will continue a regimen of once daily Epzicom twice daily prezista with norvir, and twice daily isentress. Continue Mepron for PCP prophylaxis and azithromycin for Mycobacterium avium prophylaxis  ALCOHOL ABUSE Was in treatment while incarcerated is going to need a treatment program as an outpatient to prevent relapsing.  DEPENDENCE, COCAINE, CONTINUOUS As above may need help to prevent relapse.  BIPOLAR AFFECTIVE DISORDER I refilled his Celexa and his Risperdal but he will need a therapist and a psychiatrist to  follow him in the outpatient world.  Erectile dysfunction I will give him some Viagra on a condition he continues to suppress his viral r load and use condoms  MRSA bacteremia Not clear what the source of this was he received 3 weeks of IV vancomycin and then developed renal toxicity. He does not have obvious evidence of recurrence of infection. Again I might get records from [facility].  Renal failure Synetta Fail 10 more records apparently had a renal ultrasound done at [facility] that his physician wanted me to repeat although he did not seem based on my read of the reports to have had a structural reason for renal failure based on his renal ultrasound.  HEPATITIS C, CHRONIC VIRAL, W/O HEPATIC COMA Check a hepatitis C viral load at next visit get records. I would not want to treat him with an interferon containing regimen.

## 2011-09-21 NOTE — Assessment & Plan Note (Signed)
As above may need help to prevent relapse.

## 2011-09-21 NOTE — Patient Instructions (Signed)
Your current HIV medicine regimen is as follows  EPZICOM (NEW ORANGE PILL) ONE TABLET DAILY  PREZISTA 600MG  (ORANGE PILL) TAKE TWICE DAILY  NORVIR 100MG  (WHITE TABLET) TAKE TWICE DAILY  ISENTRESS 400MG  (PINK) TAKE ONE TABLET TWICE DAILY  YOU ALSO NEED TO TAKE  MEPRON (TWO TEASPOONS) ONCE DAILY TO PREVENT PCP PNEUMONIA  AZITHROMYCIN 1200MG  WEEKLY TO PREVENT MAC  IF I CAN GET RECORDS FROM PRISON I CAN LIKELY SIMPLIFY THIS REGIMEN

## 2011-09-21 NOTE — Assessment & Plan Note (Signed)
I will give him some Viagra on a condition he continues to suppress his viral r load and use condoms

## 2011-09-21 NOTE — Assessment & Plan Note (Signed)
I refilled his Celexa and his Risperdal but he will need a therapist and a psychiatrist to follow him in the outpatient world.

## 2011-09-21 NOTE — Assessment & Plan Note (Addendum)
I would like to have records from prison sent here in particular his genotype is obtained in prison I would like to review those genotypes along with the previous genotype is obtained in our clinic and potentially simplify his anti-retroviral regimen. For now will continue a regimen of once daily Epzicom twice daily prezista with norvir, and twice daily isentress. Continue Mepron for PCP prophylaxis and azithromycin for Mycobacterium avium prophylaxis

## 2011-09-21 NOTE — Assessment & Plan Note (Signed)
Synetta Fail 10 more records apparently had a renal ultrasound done at [facility] that his physician wanted me to repeat although he did not seem based on my read of the reports to have had a structural reason for renal failure based on his renal ultrasound.

## 2011-09-21 NOTE — Assessment & Plan Note (Signed)
Check a hepatitis C viral load at next visit get records. I would not want to treat him with an interferon containing regimen.

## 2011-10-10 ENCOUNTER — Other Ambulatory Visit: Payer: Self-pay | Admitting: *Deleted

## 2011-10-10 DIAGNOSIS — B20 Human immunodeficiency virus [HIV] disease: Secondary | ICD-10-CM

## 2011-10-10 DIAGNOSIS — K219 Gastro-esophageal reflux disease without esophagitis: Secondary | ICD-10-CM

## 2011-10-10 MED ORDER — OMEPRAZOLE 20 MG PO CPDR: 20.0000 mg | DELAYED_RELEASE_CAPSULE | Freq: Every day | ORAL | Status: DC

## 2011-10-10 MED ORDER — GABAPENTIN 100 MG PO CAPS: 100.0000 mg | ORAL_CAPSULE | Freq: Three times a day (TID) | ORAL | Status: DC

## 2011-10-10 MED ORDER — AZITHROMYCIN 600 MG PO TABS: 1200.0000 mg | ORAL_TABLET | Freq: Every day | ORAL | Status: DC

## 2011-11-09 ENCOUNTER — Other Ambulatory Visit: Payer: Medicare Other

## 2011-11-09 ENCOUNTER — Other Ambulatory Visit: Payer: Self-pay | Admitting: Infectious Disease

## 2011-11-09 DIAGNOSIS — B20 Human immunodeficiency virus [HIV] disease: Secondary | ICD-10-CM

## 2011-11-09 LAB — CBC WITH DIFFERENTIAL/PLATELET
Basophils Absolute: 0 10*3/uL (ref 0.0–0.1)
Basophils Relative: 0 % (ref 0–1)
Eosinophils Absolute: 0.2 10*3/uL (ref 0.0–0.7)
Eosinophils Relative: 4 % (ref 0–5)
HCT: 39.1 % (ref 39.0–52.0)
Hemoglobin: 12.7 g/dL — ABNORMAL LOW (ref 13.0–17.0)
Lymphocytes Relative: 31 % (ref 12–46)
Lymphs Abs: 1.6 10*3/uL (ref 0.7–4.0)
MCH: 30.3 pg (ref 26.0–34.0)
MCHC: 32.5 g/dL (ref 30.0–36.0)
MCV: 93.3 fL (ref 78.0–100.0)
Monocytes Absolute: 0.5 10*3/uL (ref 0.1–1.0)
Monocytes Relative: 10 % (ref 3–12)
Neutro Abs: 2.9 10*3/uL (ref 1.7–7.7)
Neutrophils Relative %: 55 % (ref 43–77)
Platelets: 136 10*3/uL — ABNORMAL LOW (ref 150–400)
RBC: 4.19 MIL/uL — ABNORMAL LOW (ref 4.22–5.81)
RDW: 13.5 % (ref 11.5–15.5)
WBC: 5.2 10*3/uL (ref 4.0–10.5)

## 2011-11-09 LAB — COMPLETE METABOLIC PANEL WITHOUT GFR
ALT: 46 U/L (ref 0–53)
AST: 54 U/L — ABNORMAL HIGH (ref 0–37)
Albumin: 3.7 g/dL (ref 3.5–5.2)
Alkaline Phosphatase: 64 U/L (ref 39–117)
BUN: 9 mg/dL (ref 6–23)
CO2: 23 meq/L (ref 19–32)
Calcium: 9.5 mg/dL (ref 8.4–10.5)
Chloride: 105 meq/L (ref 96–112)
Creat: 0.88 mg/dL (ref 0.50–1.35)
GFR, Est African American: >89 mL/min
GFR, Est Non African American: >89 mL/min
Glucose, Bld: 80 mg/dL (ref 70–99)
Potassium: 3.9 meq/L (ref 3.5–5.3)
Sodium: 138 meq/L (ref 135–145)
Total Bilirubin: 0.7 mg/dL (ref 0.3–1.2)
Total Protein: 8.2 g/dL (ref 6.0–8.3)

## 2011-11-10 LAB — T-HELPER CELL (CD4) - (RCID CLINIC ONLY)
CD4 % Helper T Cell: 8 % — ABNORMAL LOW (ref 33–55)
CD4 T Cell Abs: 150 uL — ABNORMAL LOW (ref 400–2700)

## 2011-11-11 ENCOUNTER — Telehealth: Payer: Self-pay | Admitting: Infectious Disease

## 2011-11-11 DIAGNOSIS — B20 Human immunodeficiency virus [HIV] disease: Secondary | ICD-10-CM

## 2011-11-11 LAB — HIV-1 RNA QUANT-NO REFLEX-BLD
HIV 1 RNA Quant: 1536 copies/mL — ABNORMAL HIGH (ref <20)
HIV-1 RNA Quant, Log: 3.19 {Log} — ABNORMAL HIGH (ref <1.30)

## 2011-11-11 NOTE — Telephone Encounter (Signed)
Clydie Braun can you add on an hiv gentoype and hiv integrase genotype to Mr terhune viral load done a few days ago?

## 2011-11-15 NOTE — Telephone Encounter (Signed)
Perfect

## 2011-11-15 NOTE — Telephone Encounter (Signed)
It's been added.

## 2011-11-22 LAB — HIV-1 GENOTYPR PLUS

## 2011-11-23 ENCOUNTER — Ambulatory Visit (INDEPENDENT_AMBULATORY_CARE_PROVIDER_SITE_OTHER): Payer: Medicare Other | Admitting: Infectious Disease

## 2011-11-23 VITALS — BP 125/87 | HR 80 | Temp 98.3°F | Ht 72.0 in | Wt 224.0 lb

## 2011-11-23 DIAGNOSIS — A4902 Methicillin resistant Staphylococcus aureus infection, unspecified site: Secondary | ICD-10-CM

## 2011-11-23 DIAGNOSIS — R7881 Bacteremia: Secondary | ICD-10-CM

## 2011-11-23 DIAGNOSIS — B182 Chronic viral hepatitis C: Secondary | ICD-10-CM

## 2011-11-23 DIAGNOSIS — F319 Bipolar disorder, unspecified: Secondary | ICD-10-CM

## 2011-11-23 DIAGNOSIS — B20 Human immunodeficiency virus [HIV] disease: Secondary | ICD-10-CM

## 2011-11-23 MED ORDER — AZITHROMYCIN 600 MG PO TABS: 1200.0000 mg | ORAL_TABLET | Freq: Every day | ORAL | Status: DC

## 2011-11-23 NOTE — Assessment & Plan Note (Signed)
Continue current regimen. NEED records from prison and [facility]. I called BeckyWhite and left message for her.

## 2011-11-23 NOTE — Progress Notes (Signed)
Subjective:    Patient ID: [name], male    DOB: 06-01-1955, 56 y.o.   MRN: [id number]  HPI  56 year old [place] American man with history of HIV/AIDs and Hep C coinfection, for greater than 20 years previously cared for by Dr. Philipp Deputy at [facility]. He was incarcerated for 2 years and was just recently released from prison. He had been admitted to [facility] for MRSA bacteremia that was complicated by acute renal failure while on vancomycin. His current ARV regimen is prezista 600mg  bid, norvir 100mg  bid, isentress bid, epzicom once daily. I am thinking that he MAY NOT actually have prezista mutations and may be able to simplify him to prezista 800/norivr 100mg  and reduce pill burden by 2. His cd4 was 150 in June, VL in 1500 k range but he had been off meds and recently resumed them. He is otherwise doing well. He is still taking ursodiol but not clear to me that he absolutely needs this medicine. 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 I also NEED records from [facility].   Review of Systems  Constitutional: Negative for fever, chills, diaphoresis, activity change, appetite change, fatigue and unexpected weight change.  HENT: Negative for congestion, sore throat, rhinorrhea, sneezing, trouble swallowing and sinus pressure.   Eyes: Negative for photophobia and visual disturbance.  Respiratory: Negative for cough, chest tightness, shortness of breath, wheezing and stridor.   Cardiovascular: Negative for chest pain, palpitations and leg swelling.  Gastrointestinal: Negative for nausea, vomiting, abdominal pain, diarrhea, constipation, blood in stool, abdominal distention and anal bleeding.  Genitourinary: Negative for dysuria, hematuria, flank pain and difficulty urinating.  Musculoskeletal: Negative for myalgias, back pain, joint swelling, arthralgias and gait problem.  Skin: Negative for color change, pallor, rash and wound.  Neurological:  Negative for dizziness, tremors, weakness and light-headedness.  Hematological: Negative for adenopathy. Does not bruise/bleed easily.  Psychiatric/Behavioral: Negative for behavioral problems, confusion, disturbed wake/sleep cycle, dysphoric mood, decreased concentration and agitation.       Objective:   Physical Exam  Constitutional: He is oriented to person, place, and time. He appears well-developed and well-nourished. No distress.  HENT:  Head: Normocephalic and atraumatic.  Mouth/Throat: Oropharynx is clear and moist. No oropharyngeal exudate.  Eyes: Conjunctivae and EOM are normal. Pupils are equal, round, and reactive to light. No scleral icterus.  Neck: Normal range of motion. Neck supple. No JVD present.  Cardiovascular: Normal rate, regular rhythm and normal heart sounds.  Exam reveals no gallop and no friction rub.   No murmur heard. Pulmonary/Chest: Effort normal and breath sounds normal. No respiratory distress. He has no wheezes. He has no rales. He exhibits no tenderness.  Abdominal: He exhibits no distension and no mass. There is no tenderness. There is no rebound and no guarding.  Musculoskeletal: He exhibits no edema and no tenderness.  Lymphadenopathy:    He has no cervical adenopathy.  Neurological: He is alert and oriented to person, place, and time. He has normal reflexes. He exhibits normal muscle tone. Coordination normal.  Skin: Skin is warm and dry. He is not diaphoretic. No erythema. No pallor.  Psychiatric: He has a normal mood and affect. His behavior is normal. Judgment and thought content normal.          Assessment & Plan:  HIV DISEASE Continue current regimen. NEED records from prison and [facility]. I called BeckyWhite and left message for her.  MRSA bacteremia No  evidence of recurrnece  BIPOLAR AFFECTIVE DISORDER On seroquel  HEPATITIS C, CHRONIC VIRAL, W/O HEPATIC COMA Check viral load and genotype

## 2011-11-23 NOTE — Assessment & Plan Note (Signed)
Check viral load and genotype

## 2011-11-23 NOTE — Assessment & Plan Note (Signed)
On seroquel

## 2011-11-23 NOTE — Assessment & Plan Note (Signed)
No evidence of recurrnece

## 2011-11-25 LAB — T-HELPER CELL (CD4) - (RCID CLINIC ONLY)
CD4 % Helper T Cell: 8 % — ABNORMAL LOW (ref 33–55)
CD4 T Cell Abs: 80 uL — ABNORMAL LOW (ref 400–2700)

## 2011-11-28 LAB — HEPATITIS C RNA QUANTITATIVE
HCV Quantitative Log: 6 {Log} — ABNORMAL HIGH (ref <1.63)
HCV Quantitative: 996713 IU/mL — ABNORMAL HIGH (ref <43)

## 2011-11-28 LAB — HIV-1 RNA QUANT-NO REFLEX-BLD
HIV 1 RNA Quant: 201 copies/mL — ABNORMAL HIGH (ref <20)
HIV-1 RNA Quant, Log: 2.3 {Log} — ABNORMAL HIGH (ref <1.30)

## 2011-12-01 ENCOUNTER — Other Ambulatory Visit: Payer: Self-pay | Admitting: *Deleted

## 2011-12-01 DIAGNOSIS — F329 Major depressive disorder, single episode, unspecified: Secondary | ICD-10-CM

## 2011-12-01 MED ORDER — RISPERIDONE 2 MG PO TABS: 2.0000 mg | ORAL_TABLET | Freq: Every day | ORAL | Status: DC

## 2011-12-01 MED ORDER — CITALOPRAM HYDROBROMIDE 20 MG PO TABS: 20.0000 mg | ORAL_TABLET | Freq: Every day | ORAL | Status: DC

## 2011-12-01 NOTE — Telephone Encounter (Signed)
Pt requested that medications be filled at [facility] on [address].

## 2011-12-02 LAB — HEPATITIS C GENOTYPE

## 2012-01-16 ENCOUNTER — Other Ambulatory Visit: Payer: Medicare Other

## 2012-01-18 ENCOUNTER — Other Ambulatory Visit: Payer: Medicare Other

## 2012-01-18 DIAGNOSIS — B20 Human immunodeficiency virus [HIV] disease: Secondary | ICD-10-CM

## 2012-01-18 LAB — CBC WITH DIFFERENTIAL/PLATELET
Basophils Absolute: 0 10*3/uL (ref 0.0–0.1)
Basophils Relative: 0 % (ref 0–1)
Eosinophils Absolute: 0.3 10*3/uL (ref 0.0–0.7)
Eosinophils Relative: 6 % — ABNORMAL HIGH (ref 0–5)
HCT: 39.6 % (ref 39.0–52.0)
Hemoglobin: 13.3 g/dL (ref 13.0–17.0)
Lymphocytes Relative: 29 % (ref 12–46)
Lymphs Abs: 1.6 10*3/uL (ref 0.7–4.0)
MCH: 32.4 pg (ref 26.0–34.0)
MCHC: 33.6 g/dL (ref 30.0–36.0)
MCV: 96.4 fL (ref 78.0–100.0)
Monocytes Absolute: 0.6 10*3/uL (ref 0.1–1.0)
Monocytes Relative: 10 % (ref 3–12)
Neutro Abs: 3 10*3/uL (ref 1.7–7.7)
Neutrophils Relative %: 55 % (ref 43–77)
Platelets: 137 10*3/uL — ABNORMAL LOW (ref 150–400)
RBC: 4.11 MIL/uL — ABNORMAL LOW (ref 4.22–5.81)
RDW: 13.2 % (ref 11.5–15.5)
WBC: 5.5 10*3/uL (ref 4.0–10.5)

## 2012-01-19 LAB — COMPLETE METABOLIC PANEL WITH GFR
ALT: 64 U/L — ABNORMAL HIGH (ref 0–53)
AST: 66 U/L — ABNORMAL HIGH (ref 0–37)
Albumin: 3.6 g/dL (ref 3.5–5.2)
Alkaline Phosphatase: 64 U/L (ref 39–117)
BUN: 11 mg/dL (ref 6–23)
CO2: 27 mEq/L (ref 19–32)
Calcium: 9 mg/dL (ref 8.4–10.5)
Chloride: 104 mEq/L (ref 96–112)
Creat: 0.83 mg/dL (ref 0.50–1.35)
GFR, Est African American: >89 mL/min
GFR, Est Non African American: >89 mL/min
Glucose, Bld: 92 mg/dL (ref 70–99)
Potassium: 4 mEq/L (ref 3.5–5.3)
Sodium: 136 mEq/L (ref 135–145)
Total Bilirubin: 0.7 mg/dL (ref 0.3–1.2)
Total Protein: 8.1 g/dL (ref 6.0–8.3)

## 2012-01-19 LAB — T-HELPER CELL (CD4) - (RCID CLINIC ONLY)
CD4 % Helper T Cell: 7 % — ABNORMAL LOW (ref 33–55)
CD4 T Cell Abs: 100 uL — ABNORMAL LOW (ref 400–2700)

## 2012-01-22 LAB — HIV-1 RNA QUANT-NO REFLEX-BLD
HIV 1 RNA Quant: <20 copies/mL (ref <20)
HIV-1 RNA Quant, Log: <1.3 {Log} (ref <1.30)

## 2012-01-30 ENCOUNTER — Encounter: Payer: Self-pay | Admitting: Infectious Disease

## 2012-01-30 ENCOUNTER — Ambulatory Visit (INDEPENDENT_AMBULATORY_CARE_PROVIDER_SITE_OTHER): Payer: Medicare Other | Admitting: Infectious Disease

## 2012-01-30 VITALS — BP 132/90 | HR 81 | Temp 98.2°F | Ht 72.0 in | Wt 230.0 lb

## 2012-01-30 DIAGNOSIS — F1420 Cocaine dependence, uncomplicated: Secondary | ICD-10-CM

## 2012-01-30 DIAGNOSIS — N19 Unspecified kidney failure: Secondary | ICD-10-CM

## 2012-01-30 DIAGNOSIS — N529 Male erectile dysfunction, unspecified: Secondary | ICD-10-CM

## 2012-01-30 DIAGNOSIS — B20 Human immunodeficiency virus [HIV] disease: Secondary | ICD-10-CM

## 2012-01-30 DIAGNOSIS — B182 Chronic viral hepatitis C: Secondary | ICD-10-CM

## 2012-01-30 MED ORDER — SILDENAFIL CITRATE 100 MG PO TABS: 100.0000 mg | ORAL_TABLET | ORAL | Status: DC

## 2012-01-30 MED ORDER — RITONAVIR 100 MG PO TABS: 100.0000 mg | ORAL_TABLET | Freq: Every day | ORAL | Status: DC

## 2012-01-30 MED ORDER — DARUNAVIR ETHANOLATE 800 MG PO TABS: 800.0000 mg | ORAL_TABLET | Freq: Every day | ORAL | Status: DC

## 2012-01-30 NOTE — Progress Notes (Signed)
Subjective:    Patient ID: [name], male    DOB: 25-Mar-1956, 56 y.o.   MRN: [id number]  HPI  56 year old ever American man with HIV who had been incarcerated. 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 He had admitted to the [facility] and is done at [facility] after incarceration without renal study or and bacteremia. He been placed on a complicated regimen that included twice daily boosted Prezista, emtricitabine every 48 hours and twice daily isentress. Ultimately changed to boosted Prezista twice daily once daily Epzicom and twice-daily Isentress. On reviewing his genotype so I see no indication for him having Isentress and his current regimen nor do I see an indication for him being on twice daily Prezista. I think he can be simplified to 100 mg of Prezista with 100 mg of Norvir daily with 1 tablet of Epzicom. His viral load is nondetectable CD4 count has risen and is approaching 200. We can discontinue his of azithromycin but continue him on PCP prophylaxis. He requests Viagra which I do want to fill as long as he promised use condoms indicated to his antiretroviral medications. I spent greater than 45 minutes with the patient including greater than 50% of time in face to face counsel of the patient and in coordination of their care.    Review of Systems  Constitutional: Negative for fever, chills, diaphoresis, activity change, appetite change, fatigue and unexpected weight change.  HENT: Negative for congestion, sore throat, rhinorrhea, sneezing, trouble swallowing and sinus pressure.   Eyes: Negative for photophobia and visual disturbance.  Respiratory: Negative for cough, chest tightness, shortness of breath, wheezing and stridor.   Cardiovascular: Negative for chest pain, palpitations and leg swelling.  Gastrointestinal: Negative for nausea, vomiting, abdominal pain, diarrhea, constipation,  blood in stool, abdominal distention and anal bleeding.  Genitourinary: Negative for dysuria, hematuria, flank pain and difficulty urinating.  Musculoskeletal: Negative for myalgias, back pain, joint swelling, arthralgias and gait problem.  Skin: Negative for color change, pallor, rash and wound.  Neurological: Negative for dizziness, tremors, weakness and light-headedness.  Hematological: Negative for adenopathy. Does not bruise/bleed easily.  Psychiatric/Behavioral: Negative for behavioral problems, confusion, disturbed wake/sleep cycle, dysphoric mood, decreased concentration and agitation.       Objective:   Physical Exam  Constitutional: He is oriented to person, place, and time. He appears well-developed and well-nourished. No distress.  HENT:  Head: Normocephalic and atraumatic.  Mouth/Throat: Oropharynx is clear and moist. No oropharyngeal exudate.  Eyes: Conjunctivae and EOM are normal. Pupils are equal, round, and reactive to light. No scleral icterus.  Neck: Normal range of motion. Neck supple. No JVD present.  Cardiovascular: Normal rate, regular rhythm and normal heart sounds.  Exam reveals no gallop and no friction rub.   No murmur heard. Pulmonary/Chest: Effort normal and breath sounds normal. No respiratory distress. He has no wheezes. He has no rales. He exhibits no tenderness.  Abdominal: He exhibits no distension and no mass. There is no tenderness. There is no rebound and no guarding.  Musculoskeletal: He exhibits no edema and no tenderness.  Lymphadenopathy:    He has no cervical adenopathy.  Neurological: He is alert and oriented to person, place, and time. He has normal reflexes. He exhibits normal muscle tone. Coordination normal.  Skin: Skin is warm and dry. He is not diaphoretic. No erythema. No pallor.  Psychiatric: He has a normal mood and  affect. His behavior is normal. Judgment and thought content normal.          Assessment & Plan:  HIV  DISEASE Simplified to Prezista 800 Norvir 100 mg Epzicom daily   DEPENDENCE, COCAINE, CONTINUOUS Claims to be drug free  Renal failure Creatinine currently stable.  HEPATITIS C, CHRONIC VIRAL, W/O HEPATIC COMA Will likely end up waiting for a interferon free regimen.

## 2012-01-30 NOTE — Assessment & Plan Note (Signed)
Claims to be drug free

## 2012-01-30 NOTE — Assessment & Plan Note (Signed)
Will likely end up waiting for a interferon free regimen.

## 2012-01-30 NOTE — Patient Instructions (Signed)
YOUR NEW REGIMEN IS:  PREZISTA 800MG  (DARK RED PILL) ONCE DAILY  WITH  NORVIR 100MG  (WHITE PILL) ONE PILL DAILY  WITH  EPZICOM (ORANGE PILL) ONE PILL DAILY   YOU CAN STOP THE PREZISTA 600MG  (DARK ORANGE)  AND  STOP THE ISENTRESS (PINK PILL 227)  AND   NOW ONLY NEED TO TAKE THE NORVIR ONCE A DAY

## 2012-01-30 NOTE — Assessment & Plan Note (Signed)
Creatinine currently stable.

## 2012-01-30 NOTE — Assessment & Plan Note (Addendum)
Simplified to Prezista 800 Norvir 100 mg Epzicom daily

## 2012-02-29 ENCOUNTER — Other Ambulatory Visit (INDEPENDENT_AMBULATORY_CARE_PROVIDER_SITE_OTHER): Payer: Medicare Other

## 2012-02-29 DIAGNOSIS — B20 Human immunodeficiency virus [HIV] disease: Secondary | ICD-10-CM

## 2012-02-29 LAB — CBC WITH DIFFERENTIAL/PLATELET
Basophils Absolute: 0 10*3/uL (ref 0.0–0.1)
Basophils Relative: 0 % (ref 0–1)
Eosinophils Absolute: 0.2 10*3/uL (ref 0.0–0.7)
Eosinophils Relative: 4 % (ref 0–5)
HCT: 38.7 % — ABNORMAL LOW (ref 39.0–52.0)
Hemoglobin: 12.2 g/dL — ABNORMAL LOW (ref 13.0–17.0)
Lymphocytes Relative: 24 % (ref 12–46)
Lymphs Abs: 1.3 10*3/uL (ref 0.7–4.0)
MCH: 31.7 pg (ref 26.0–34.0)
MCHC: 31.5 g/dL (ref 30.0–36.0)
MCV: 100.5 fL — ABNORMAL HIGH (ref 78.0–100.0)
Monocytes Absolute: 0.4 10*3/uL (ref 0.1–1.0)
Monocytes Relative: 8 % (ref 3–12)
Neutro Abs: 3.5 10*3/uL (ref 1.7–7.7)
Neutrophils Relative %: 64 % (ref 43–77)
Platelets: 172 10*3/uL (ref 150–400)
RBC: 3.85 MIL/uL — ABNORMAL LOW (ref 4.22–5.81)
RDW: 13.4 % (ref 11.5–15.5)
WBC: 5.5 10*3/uL (ref 4.0–10.5)

## 2012-03-01 LAB — COMPLETE METABOLIC PANEL WITH GFR
ALT: 54 U/L — ABNORMAL HIGH (ref 0–53)
AST: 71 U/L — ABNORMAL HIGH (ref 0–37)
Albumin: 3.5 g/dL (ref 3.5–5.2)
Alkaline Phosphatase: 72 U/L (ref 39–117)
BUN: 9 mg/dL (ref 6–23)
CO2: 25 mEq/L (ref 19–32)
Calcium: 9.7 mg/dL (ref 8.4–10.5)
Chloride: 107 mEq/L (ref 96–112)
Creat: 0.74 mg/dL (ref 0.50–1.35)
GFR, Est African American: >89 mL/min
GFR, Est Non African American: >89 mL/min
Glucose, Bld: 87 mg/dL (ref 70–99)
Potassium: 4.2 mEq/L (ref 3.5–5.3)
Sodium: 137 mEq/L (ref 135–145)
Total Bilirubin: 0.8 mg/dL (ref 0.3–1.2)
Total Protein: 8.3 g/dL (ref 6.0–8.3)

## 2012-03-01 LAB — T-HELPER CELL (CD4) - (RCID CLINIC ONLY)
CD4 % Helper T Cell: 9 % — ABNORMAL LOW (ref 33–55)
CD4 T Cell Abs: 120 uL — ABNORMAL LOW (ref 400–2700)

## 2012-03-01 LAB — HIV-1 RNA QUANT-NO REFLEX-BLD
HIV 1 RNA Quant: <20 copies/mL (ref <20)
HIV-1 RNA Quant, Log: <1.3 {Log} (ref <1.30)

## 2012-03-14 ENCOUNTER — Ambulatory Visit (INDEPENDENT_AMBULATORY_CARE_PROVIDER_SITE_OTHER): Payer: Medicare Other | Admitting: Infectious Disease

## 2012-03-14 ENCOUNTER — Encounter: Payer: Self-pay | Admitting: Infectious Disease

## 2012-03-14 VITALS — BP 144/92 | HR 80 | Temp 97.4°F | Wt 228.0 lb

## 2012-03-14 DIAGNOSIS — F1020 Alcohol dependence, uncomplicated: Secondary | ICD-10-CM

## 2012-03-14 DIAGNOSIS — B20 Human immunodeficiency virus [HIV] disease: Secondary | ICD-10-CM

## 2012-03-14 DIAGNOSIS — I10 Essential (primary) hypertension: Secondary | ICD-10-CM

## 2012-03-14 DIAGNOSIS — N529 Male erectile dysfunction, unspecified: Secondary | ICD-10-CM

## 2012-03-14 DIAGNOSIS — E785 Hyperlipidemia, unspecified: Secondary | ICD-10-CM

## 2012-03-14 DIAGNOSIS — Z113 Encounter for screening for infections with a predominantly sexual mode of transmission: Secondary | ICD-10-CM

## 2012-03-14 NOTE — Progress Notes (Signed)
Subjective:    Patient ID: [name], male    DOB: Jan 24, 1956, 56 y.o.   MRN: [id number]  HPI  56 year old ever American man with HIV who had been incarcerated. 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 He ultimately had been admitted to [facility] where he had suffered from MRSA bacteremia. He was treated with IV antibiotics and ultimately discharged and establish care again here in [facility]. His HIV is currently perfectly controlled with a viral load of less than 20 copies on Epzicom Prezista and Norvir. His CD4 count is up to 120. He is taking atovaquone for PCP prophylaxis.  He continues to have suffered from erectile dysfunction and mastoid check a serum testosterone which we'll do.  He continues to make efforts to stop smoking but still is smoking tobacco he went through the list of health conditions that he is putting himself at risk for he will continue to make efforts to stop smoking but he does not want me to give him nicotine patches.  I spent greater than 45 minutes with the patient including greater than 50% of time in face to face counsel of the patient and in coordination of their care.        Review of Systems  Constitutional: Negative for fever, chills, diaphoresis, activity change, appetite change, fatigue and unexpected weight change.  HENT: Negative for congestion, sore throat, rhinorrhea, sneezing, trouble swallowing and sinus pressure.   Eyes: Negative for photophobia and visual disturbance.  Respiratory: Negative for cough, chest tightness, shortness of breath, wheezing and stridor.   Cardiovascular: Negative for chest pain, palpitations and leg swelling.  Gastrointestinal: Negative for nausea, vomiting, abdominal pain, diarrhea, constipation, blood in stool, abdominal distention and anal bleeding.  Genitourinary: Negative for dysuria, hematuria, flank pain and difficulty urinating.  Musculoskeletal:  Negative for myalgias, back pain, joint swelling, arthralgias and gait problem.  Skin: Negative for color change, pallor, rash and wound.  Neurological: Negative for dizziness, tremors, weakness and light-headedness.  Hematological: Negative for adenopathy. Does not bruise/bleed easily.  Psychiatric/Behavioral: Negative for behavioral problems, confusion, disturbed wake/sleep cycle, dysphoric mood, decreased concentration and agitation.       Objective:   Physical Exam  Constitutional: He is oriented to person, place, and time. He appears well-developed and well-nourished. No distress.  HENT:  Head: Normocephalic and atraumatic.  Mouth/Throat: Oropharynx is clear and moist. No oropharyngeal exudate.  Eyes: Conjunctivae normal and EOM are normal. Pupils are equal, round, and reactive to light. No scleral icterus.  Neck: Normal range of motion. Neck supple. No JVD present.  Cardiovascular: Normal rate, regular rhythm and normal heart sounds.  Exam reveals no gallop and no friction rub.   No murmur heard. Pulmonary/Chest: Effort normal and breath sounds normal. No respiratory distress. He has no wheezes. He has no rales. He exhibits no tenderness.  Abdominal: He exhibits no distension and no mass. There is no tenderness. There is no rebound and no guarding.  Musculoskeletal: He exhibits no edema and no tenderness.  Lymphadenopathy:    He has no cervical adenopathy.  Neurological: He is alert and oriented to person, place, and time. He has normal reflexes. He exhibits normal muscle tone. Coordination normal.  Skin: Skin is warm and dry. He is not diaphoretic. No erythema. No pallor.  Psychiatric: He has a normal mood and affect. His behavior is normal. Judgment and thought content normal.          Assessment &  Plan:   #1 HIV-AIDS: Continue Prezista Norvir Epzicom continue atovaquone prophylaxis.  #2 erectile dysfunction we'll check serum testosterone in the past  #3 insufficiency  has stabilized.  #4 hypertension will likely need of drug added to control his blood pressure better. Some degree of this may be white coat hypertension we'll check a microalbumin creatinine ratio today as well.  #5 smoking he'll continue work on smoking cessation.  #6 history of alcoholism he is attending AA meetings has been sober for 2 years. He is also not using other drugs as cocaine.

## 2012-03-15 LAB — TESTOSTERONE: Testosterone: 444.55 ng/dL (ref 300–890)

## 2012-05-23 ENCOUNTER — Encounter (HOSPITAL_COMMUNITY): Payer: Self-pay | Admitting: Emergency Medicine

## 2012-05-23 ENCOUNTER — Emergency Department (HOSPITAL_COMMUNITY)
Admission: EM | Admit: 2012-05-23 | Discharge: 2012-05-23 | Disposition: A | Discharge status: Home / Self Care | Payer: Medicare Other | Attending: Emergency Medicine | Admitting: Emergency Medicine

## 2012-05-23 ENCOUNTER — Emergency Department (HOSPITAL_COMMUNITY): Payer: Medicare Other

## 2012-05-23 DIAGNOSIS — R05 Cough: Secondary | ICD-10-CM

## 2012-05-23 DIAGNOSIS — Z87828 Personal history of other (healed) physical injury and trauma: Secondary | ICD-10-CM | POA: Insufficient documentation

## 2012-05-23 DIAGNOSIS — Z87448 Personal history of other diseases of urinary system: Secondary | ICD-10-CM | POA: Insufficient documentation

## 2012-05-23 DIAGNOSIS — F17200 Nicotine dependence, unspecified, uncomplicated: Secondary | ICD-10-CM | POA: Insufficient documentation

## 2012-05-23 DIAGNOSIS — J4489 Other specified chronic obstructive pulmonary disease: Secondary | ICD-10-CM | POA: Insufficient documentation

## 2012-05-23 DIAGNOSIS — J209 Acute bronchitis, unspecified: Secondary | ICD-10-CM | POA: Insufficient documentation

## 2012-05-23 DIAGNOSIS — Z8739 Personal history of other diseases of the musculoskeletal system and connective tissue: Secondary | ICD-10-CM | POA: Insufficient documentation

## 2012-05-23 DIAGNOSIS — F1910 Other psychoactive substance abuse, uncomplicated: Secondary | ICD-10-CM | POA: Insufficient documentation

## 2012-05-23 DIAGNOSIS — J449 Chronic obstructive pulmonary disease, unspecified: Secondary | ICD-10-CM | POA: Insufficient documentation

## 2012-05-23 DIAGNOSIS — Z79899 Other long term (current) drug therapy: Secondary | ICD-10-CM | POA: Insufficient documentation

## 2012-05-23 DIAGNOSIS — Z8614 Personal history of Methicillin resistant Staphylococcus aureus infection: Secondary | ICD-10-CM | POA: Insufficient documentation

## 2012-05-23 DIAGNOSIS — J40 Bronchitis, not specified as acute or chronic: Secondary | ICD-10-CM

## 2012-05-23 HISTORY — DX: Human immunodeficiency virus (HIV) disease: B20

## 2012-05-23 HISTORY — DX: Asymptomatic human immunodeficiency virus (hiv) infection status: Z21

## 2012-05-23 LAB — CBC WITH DIFFERENTIAL/PLATELET
Basophils Absolute: 0 10*3/uL (ref 0.0–0.1)
Basophils Relative: 0 % (ref 0–1)
Eosinophils Absolute: 0.1 10*3/uL (ref 0.0–0.7)
Eosinophils Relative: 1 % (ref 0–5)
HCT: 41.3 % (ref 39.0–52.0)
Hemoglobin: 13.8 g/dL (ref 13.0–17.0)
Lymphocytes Relative: 17 % (ref 12–46)
Lymphs Abs: 0.9 10*3/uL (ref 0.7–4.0)
MCH: 31.2 pg (ref 26.0–34.0)
MCHC: 33.4 g/dL (ref 30.0–36.0)
MCV: 93.2 fL (ref 78.0–100.0)
Monocytes Absolute: 0.5 10*3/uL (ref 0.1–1.0)
Monocytes Relative: 9 % (ref 3–12)
Neutro Abs: 4 10*3/uL (ref 1.7–7.7)
Neutrophils Relative %: 74 % (ref 43–77)
Platelets: 126 10*3/uL — ABNORMAL LOW (ref 150–400)
RBC: 4.43 MIL/uL (ref 4.22–5.81)
RDW: 13.4 % (ref 11.5–15.5)
WBC: 5.4 10*3/uL (ref 4.0–10.5)

## 2012-05-23 LAB — BASIC METABOLIC PANEL
BUN: 10 mg/dL (ref 6–23)
CO2: 24 mEq/L (ref 19–32)
Calcium: 8.9 mg/dL (ref 8.4–10.5)
Chloride: 98 mEq/L (ref 96–112)
Creatinine, Ser: 0.75 mg/dL (ref 0.50–1.35)
GFR calc Af Amer: >90 mL/min (ref >90)
GFR calc non Af Amer: >90 mL/min (ref >90)
Glucose, Bld: 105 mg/dL — ABNORMAL HIGH (ref 70–99)
Potassium: 3.4 mEq/L — ABNORMAL LOW (ref 3.5–5.1)
Sodium: 131 mEq/L — ABNORMAL LOW (ref 135–145)

## 2012-05-23 LAB — TROPONIN I: Troponin I: <0.3 ng/mL (ref <0.30)

## 2012-05-23 LAB — LACTIC ACID, PLASMA: Lactic Acid, Venous: 1.3 mmol/L (ref 0.5–2.2)

## 2012-05-23 MED ORDER — PREDNISONE 50 MG PO TABS: ORAL_TABLET | ORAL | Status: DC

## 2012-05-23 MED ORDER — ALBUTEROL SULFATE (5 MG/ML) 0.5% IN NEBU
2.5000 mg | INHALATION_SOLUTION | Freq: Once | RESPIRATORY_TRACT | Status: AC
  Administered 2012-05-23: 2.5 mg via RESPIRATORY_TRACT
  Filled 2012-05-23: qty 0.5

## 2012-05-23 MED ORDER — IPRATROPIUM BROMIDE 0.02 % IN SOLN
0.5000 mg | Freq: Once | RESPIRATORY_TRACT | Status: AC
  Administered 2012-05-23: 0.5 mg via RESPIRATORY_TRACT
  Filled 2012-05-23: qty 2.5

## 2012-05-23 MED ORDER — ACETAMINOPHEN 325 MG PO TABS
650.0000 mg | ORAL_TABLET | Freq: Once | ORAL | Status: AC
  Administered 2012-05-23: 650 mg via ORAL
  Filled 2012-05-23: qty 2

## 2012-05-23 MED ORDER — DOXYCYCLINE HYCLATE 100 MG PO CAPS: 100.0000 mg | ORAL_CAPSULE | Freq: Two times a day (BID) | ORAL | Status: DC

## 2012-05-23 MED ORDER — OSELTAMIVIR PHOSPHATE 75 MG PO CAPS
75.0000 mg | ORAL_CAPSULE | Freq: Every day | ORAL | Status: DC
  Administered 2012-05-23: 75 mg via ORAL
  Filled 2012-05-23: qty 1

## 2012-05-23 MED ORDER — OSELTAMIVIR PHOSPHATE 75 MG PO CAPS: 75.0000 mg | ORAL_CAPSULE | Freq: Two times a day (BID) | ORAL | Status: DC

## 2012-05-23 MED ORDER — ALBUTEROL SULFATE HFA 108 (90 BASE) MCG/ACT IN AERS: 2.0000 | INHALATION_SPRAY | RESPIRATORY_TRACT | Status: DC | PRN

## 2012-05-23 NOTE — ED Notes (Addendum)
Pt c/o productive cough for 3 days, worse at night.  Pt lives in group home for homeless [facility] and is HIV positive.  Pt denies chest pain or difficulty breathing in triage.
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
## 2012-05-23 NOTE — ED Notes (Signed)
RT at bedside.

## 2012-05-24 LAB — INFLUENZA PANEL BY PCR (TYPE A & B)
H1N1 flu by pcr: NOT DETECTED
Influenza A By PCR: NEGATIVE
Influenza B By PCR: NEGATIVE

## 2012-05-30 LAB — CULTURE, BLOOD (ROUTINE X 2)
Culture: NO GROWTH
Culture: NO GROWTH

## 2012-06-11 ENCOUNTER — Other Ambulatory Visit: Payer: Self-pay | Admitting: Infectious Disease

## 2012-06-15 ENCOUNTER — Other Ambulatory Visit: Payer: Self-pay | Admitting: Licensed Clinical Social Worker

## 2012-06-15 DIAGNOSIS — N529 Male erectile dysfunction, unspecified: Secondary | ICD-10-CM

## 2012-06-15 DIAGNOSIS — K219 Gastro-esophageal reflux disease without esophagitis: Secondary | ICD-10-CM

## 2012-06-15 MED ORDER — OMEPRAZOLE 20 MG PO CPDR: 20.0000 mg | DELAYED_RELEASE_CAPSULE | Freq: Every day | ORAL | Status: DC

## 2012-06-15 MED ORDER — SILDENAFIL CITRATE 100 MG PO TABS: 100.0000 mg | ORAL_TABLET | ORAL | Status: DC

## 2012-07-16 ENCOUNTER — Other Ambulatory Visit: Payer: Self-pay | Admitting: Infectious Disease

## 2012-07-16 ENCOUNTER — Other Ambulatory Visit (INDEPENDENT_AMBULATORY_CARE_PROVIDER_SITE_OTHER): Payer: Medicare Other

## 2012-07-16 DIAGNOSIS — E785 Hyperlipidemia, unspecified: Secondary | ICD-10-CM

## 2012-07-16 DIAGNOSIS — B20 Human immunodeficiency virus [HIV] disease: Secondary | ICD-10-CM

## 2012-07-16 DIAGNOSIS — Z113 Encounter for screening for infections with a predominantly sexual mode of transmission: Secondary | ICD-10-CM

## 2012-07-17 LAB — COMPLETE METABOLIC PANEL WITH GFR
ALT: 52 U/L (ref 0–53)
AST: 52 U/L — ABNORMAL HIGH (ref 0–37)
Albumin: 3.9 g/dL (ref 3.5–5.2)
Alkaline Phosphatase: 71 U/L (ref 39–117)
BUN: 8 mg/dL (ref 6–23)
CO2: 29 mEq/L (ref 19–32)
Calcium: 9.5 mg/dL (ref 8.4–10.5)
Chloride: 101 mEq/L (ref 96–112)
Creat: 0.86 mg/dL (ref 0.50–1.35)
GFR, Est African American: >89 mL/min
GFR, Est Non African American: >89 mL/min
Glucose, Bld: 95 mg/dL (ref 70–99)
Potassium: 4 mEq/L (ref 3.5–5.3)
Sodium: 137 mEq/L (ref 135–145)
Total Bilirubin: 0.7 mg/dL (ref 0.3–1.2)
Total Protein: 8.2 g/dL (ref 6.0–8.3)

## 2012-07-17 LAB — RPR

## 2012-07-18 LAB — CBC WITH DIFFERENTIAL/PLATELET
Basophils Absolute: 0 10*3/uL (ref 0.0–0.1)
Basophils Relative: 0 % (ref 0–1)
Eosinophils Absolute: 0.2 10*3/uL (ref 0.0–0.7)
Eosinophils Relative: 4 % (ref 0–5)
HCT: 43.3 % (ref 39.0–52.0)
Hemoglobin: 14 g/dL (ref 13.0–17.0)
Lymphocytes Relative: 25 % (ref 12–46)
Lymphs Abs: 1.2 10*3/uL (ref 0.7–4.0)
MCH: 30.8 pg (ref 26.0–34.0)
MCHC: 32.3 g/dL (ref 30.0–36.0)
MCV: 95.2 fL (ref 78.0–100.0)
Monocytes Absolute: 0.5 10*3/uL (ref 0.1–1.0)
Monocytes Relative: 10 % (ref 3–12)
Neutro Abs: 3.1 10*3/uL (ref 1.7–7.7)
Neutrophils Relative %: 61 % (ref 43–77)
Platelets: 150 10*3/uL (ref 150–400)
RBC: 4.55 MIL/uL (ref 4.22–5.81)
RDW: 14.4 % (ref 11.5–15.5)
WBC: 5 10*3/uL (ref 4.0–10.5)

## 2012-07-18 LAB — T-HELPER CELL (CD4) - (RCID CLINIC ONLY)
CD4 % Helper T Cell: 9 % — ABNORMAL LOW (ref 33–55)
CD4 T Cell Abs: 110 uL — ABNORMAL LOW (ref 400–2700)

## 2012-07-19 LAB — HIV-1 RNA QUANT-NO REFLEX-BLD
HIV 1 RNA Quant: 63 copies/mL — ABNORMAL HIGH (ref <20)
HIV-1 RNA Quant, Log: 1.8 {Log} — ABNORMAL HIGH (ref <1.30)

## 2012-07-30 ENCOUNTER — Ambulatory Visit: Payer: Medicare Other | Admitting: Infectious Disease

## 2012-08-02 ENCOUNTER — Ambulatory Visit (INDEPENDENT_AMBULATORY_CARE_PROVIDER_SITE_OTHER): Payer: Medicare Other | Admitting: Infectious Disease

## 2012-08-02 ENCOUNTER — Encounter: Payer: Self-pay | Admitting: Infectious Disease

## 2012-08-02 VITALS — BP 121/82 | HR 88 | Temp 98.2°F | Ht 72.0 in | Wt 230.0 lb

## 2012-08-02 DIAGNOSIS — B20 Human immunodeficiency virus [HIV] disease: Secondary | ICD-10-CM

## 2012-08-02 DIAGNOSIS — Z23 Encounter for immunization: Secondary | ICD-10-CM

## 2012-08-02 DIAGNOSIS — E785 Hyperlipidemia, unspecified: Secondary | ICD-10-CM

## 2012-08-02 DIAGNOSIS — Z113 Encounter for screening for infections with a predominantly sexual mode of transmission: Secondary | ICD-10-CM

## 2012-08-02 DIAGNOSIS — Z0000 Encounter for general adult medical examination without abnormal findings: Secondary | ICD-10-CM

## 2012-08-02 DIAGNOSIS — F329 Major depressive disorder, single episode, unspecified: Secondary | ICD-10-CM

## 2012-08-02 DIAGNOSIS — D696 Thrombocytopenia, unspecified: Secondary | ICD-10-CM

## 2012-08-02 MED ORDER — RISPERIDONE 2 MG PO TABS: 2.0000 mg | ORAL_TABLET | Freq: Every day | ORAL | Status: DC

## 2012-08-02 MED ORDER — ATOVAQUONE 750 MG/5ML PO SUSP: 1500.0000 mg | Freq: Every day | ORAL | Status: DC

## 2012-08-02 MED ORDER — ABACAVIR SULFATE-LAMIVUDINE 600-300 MG PO TABS: 1.0000 | ORAL_TABLET | Freq: Every day | ORAL | Status: DC

## 2012-08-02 MED ORDER — RITONAVIR 100 MG PO TABS: 100.0000 mg | ORAL_TABLET | Freq: Every day | ORAL | Status: DC

## 2012-08-02 MED ORDER — ALBUTEROL SULFATE HFA 108 (90 BASE) MCG/ACT IN AERS: 2.0000 | INHALATION_SPRAY | Freq: Four times a day (QID) | RESPIRATORY_TRACT | Status: DC | PRN

## 2012-08-02 MED ORDER — DARUNAVIR ETHANOLATE 800 MG PO TABS: 800.0000 mg | ORAL_TABLET | Freq: Every day | ORAL | Status: DC

## 2012-08-02 MED ORDER — CITALOPRAM HYDROBROMIDE 20 MG PO TABS: 20.0000 mg | ORAL_TABLET | Freq: Every day | ORAL | Status: DC

## 2012-08-02 NOTE — Progress Notes (Signed)
Subjective:    Patient ID: [name], male    DOB: 26-Dec-1955, 57 y.o.   MRN: [id number]  HPI   57 year old ever American man with HIV who had been incarcerated. 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 He ultimately had been admitted to [facility] where he had suffered from MRSA bacteremia. He was treated with IV antibiotics and ultimately discharged and establish [facility]. His HIV is currently perfectly controlled with a viral load of less than 20 copies on Epzicom Prezista and Norvir. His CD4 count is up to 120. He is taking atovaquone for PCP prophylaxis.  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 He had 3 As, B in last classes.   He has been clean of etoh, drugs for 3 years.  Still working on smoking if he smokes 2 packs he has problems with bronchitis and requires albuterol.  Otherwise no complaints today.  Congratulated him on his excellent adherence and refraining from etoh or illicit drugs.          Review of Systems  Constitutional: Negative for fever, chills, diaphoresis, activity change, appetite change, fatigue and unexpected weight change.  HENT: Negative for congestion, sore throat, rhinorrhea, sneezing, trouble swallowing and sinus pressure.   Eyes: Negative for photophobia and visual disturbance.  Respiratory: Negative for cough, chest tightness, shortness of breath, wheezing and stridor.   Cardiovascular: Negative for chest pain, palpitations and leg swelling.  Gastrointestinal: Negative for nausea, vomiting, abdominal pain, diarrhea, constipation, blood in stool, abdominal distention and anal bleeding.  Genitourinary: Negative for dysuria, hematuria, flank pain and difficulty urinating.  Musculoskeletal: Negative for myalgias, back pain, joint swelling, arthralgias and gait problem.  Skin: Negative for color change, pallor, rash and  wound.  Neurological: Negative for dizziness, tremors, weakness and light-headedness.  Hematological: Negative for adenopathy. Does not bruise/bleed easily.  Psychiatric/Behavioral: Negative for behavioral problems, confusion, sleep disturbance, dysphoric mood, decreased concentration and agitation.       Objective:   Physical Exam  Constitutional: He is oriented to person, place, and time. He appears well-developed and well-nourished. No distress.  HENT:  Head: Normocephalic and atraumatic.  Mouth/Throat: Oropharynx is clear and moist. No oropharyngeal exudate.  Eyes: Conjunctivae and EOM are normal. Pupils are equal, round, and reactive to light. No scleral icterus.  Neck: Normal range of motion. Neck supple. No JVD present.  Cardiovascular: Normal rate, regular rhythm and normal heart sounds.  Exam reveals no gallop and no friction rub.   No murmur heard. Pulmonary/Chest: Effort normal and breath sounds normal. No respiratory distress. He has no wheezes. He has no rales. He exhibits no tenderness.  Abdominal: He exhibits no distension and no mass. There is no tenderness. There is no rebound and no guarding.  Musculoskeletal: He exhibits no edema and no tenderness.  Lymphadenopathy:    He has no cervical adenopathy.  Neurological: He is alert and oriented to person, place, and time. He has normal reflexes. He exhibits normal muscle tone. Coordination normal.  Skin: Skin is warm and dry. He is not diaphoretic. No erythema. No pallor.  Psychiatric: He has a normal mood and affect. His behavior is normal. Judgment and thought content normal.          Assessment & Plan:   #1 HIV-AIDS: Continue Prezista Norvir Epzicom continue atovaquone prophylaxis.  #2 Renal insufficiency resolved.  #4 hypertension : pretty well  controlled at present  #5 smoking he'll continue work on smoking cessation.  #6 history of alcoholism he is attending AA meetings has been sober for 2 years. He is  also not using other drugs as cocaine.

## 2012-09-10 ENCOUNTER — Telehealth: Payer: Self-pay | Admitting: *Deleted

## 2012-09-10 NOTE — Telephone Encounter (Signed)
Patient bringing by a form this week for Dr. Daiva Eves to review/sign regarding patient's disability and his student loans.  Andree Coss, RN

## 2012-09-12 NOTE — Telephone Encounter (Signed)
He was supposed to bring it in for you to sign.  He has not yet done this.

## 2012-09-12 NOTE — Telephone Encounter (Signed)
I cannot see what I am supposed to fill out for him?

## 2012-09-13 ENCOUNTER — Telehealth: Payer: Self-pay | Admitting: Licensed Clinical Social Worker

## 2012-09-13 NOTE — Telephone Encounter (Signed)
Patient brought in forms last week  for school for student loans to be filled out. He has called numerous times today needing his form.

## 2012-09-14 NOTE — Telephone Encounter (Signed)
I told the patient that you wouldn't be back in the office until next week, he seem to be ok with that.

## 2012-09-14 NOTE — Telephone Encounter (Signed)
If someone can get me that piece of paper I will attest to this ability and sign it. I will running round at [facility] all day today, I can also try to swing by clinic at some point. I have a noon meeting coming up in the interval

## 2012-09-14 NOTE — Telephone Encounter (Signed)
I found one piece of paper on Monday from him and nowhere on it was there information for me to fillout as a provider. Can we find what it is he needs filled out and IF we still have it? I asked Marcelino Duster about it earlier in the [facility], perhaps she has the remainder of the forms, OR he never brough in everything

## 2012-09-14 NOTE — Telephone Encounter (Signed)
OK thanks Tamika!

## 2012-09-14 NOTE — Telephone Encounter (Signed)
It requires a physician statement that the patient is able to participate in gainful activity. He is wanting to go to summer school and because he is on disability they want to make sure he is able to participate in classes before they approve his student loan.

## 2012-09-19 ENCOUNTER — Encounter: Payer: Self-pay | Admitting: Infectious Disease

## 2012-10-01 ENCOUNTER — Encounter: Payer: Self-pay | Admitting: *Deleted

## 2012-11-02 ENCOUNTER — Other Ambulatory Visit: Payer: Self-pay | Admitting: Infectious Disease

## 2012-11-22 ENCOUNTER — Other Ambulatory Visit: Payer: Medicare Other

## 2012-11-29 ENCOUNTER — Encounter (HOSPITAL_COMMUNITY): Payer: Self-pay | Admitting: *Deleted

## 2012-11-29 ENCOUNTER — Encounter (HOSPITAL_COMMUNITY)
Admission: EM | Disposition: A | Discharge status: Other Acute Inpatient Hospital | Payer: Self-pay | Source: Home / Self Care | Attending: Emergency Medicine

## 2012-11-29 ENCOUNTER — Encounter (HOSPITAL_COMMUNITY): Payer: Self-pay | Admitting: Registered Nurse

## 2012-11-29 ENCOUNTER — Observation Stay (HOSPITAL_COMMUNITY): Payer: PRIVATE HEALTH INSURANCE | Admitting: Registered Nurse

## 2012-11-29 ENCOUNTER — Emergency Department (HOSPITAL_COMMUNITY): Payer: PRIVATE HEALTH INSURANCE

## 2012-11-29 ENCOUNTER — Observation Stay (HOSPITAL_COMMUNITY)
Admission: EM | Admit: 2012-11-29 | Discharge: 2012-11-30 | Payer: PRIVATE HEALTH INSURANCE | Attending: Family Medicine | Admitting: Family Medicine

## 2012-11-29 DIAGNOSIS — F329 Major depressive disorder, single episode, unspecified: Secondary | ICD-10-CM

## 2012-11-29 DIAGNOSIS — F17200 Nicotine dependence, unspecified, uncomplicated: Secondary | ICD-10-CM

## 2012-11-29 DIAGNOSIS — J449 Chronic obstructive pulmonary disease, unspecified: Secondary | ICD-10-CM | POA: Insufficient documentation

## 2012-11-29 DIAGNOSIS — B182 Chronic viral hepatitis C: Secondary | ICD-10-CM

## 2012-11-29 DIAGNOSIS — J4489 Other specified chronic obstructive pulmonary disease: Secondary | ICD-10-CM | POA: Insufficient documentation

## 2012-11-29 DIAGNOSIS — D551 Anemia due to other disorders of glutathione metabolism: Secondary | ICD-10-CM

## 2012-11-29 DIAGNOSIS — B370 Candidal stomatitis: Secondary | ICD-10-CM

## 2012-11-29 DIAGNOSIS — G9389 Other specified disorders of brain: Secondary | ICD-10-CM | POA: Insufficient documentation

## 2012-11-29 DIAGNOSIS — B20 Human immunodeficiency virus [HIV] disease: Secondary | ICD-10-CM

## 2012-11-29 DIAGNOSIS — F1010 Alcohol abuse, uncomplicated: Secondary | ICD-10-CM

## 2012-11-29 DIAGNOSIS — N529 Male erectile dysfunction, unspecified: Secondary | ICD-10-CM

## 2012-11-29 DIAGNOSIS — R079 Chest pain, unspecified: Secondary | ICD-10-CM

## 2012-11-29 DIAGNOSIS — B9562 Methicillin resistant Staphylococcus aureus infection as the cause of diseases classified elsewhere: Secondary | ICD-10-CM

## 2012-11-29 DIAGNOSIS — D649 Anemia, unspecified: Secondary | ICD-10-CM

## 2012-11-29 DIAGNOSIS — J438 Other emphysema: Secondary | ICD-10-CM

## 2012-11-29 DIAGNOSIS — J45909 Unspecified asthma, uncomplicated: Secondary | ICD-10-CM

## 2012-11-29 DIAGNOSIS — Z21 Asymptomatic human immunodeficiency virus [HIV] infection status: Secondary | ICD-10-CM | POA: Insufficient documentation

## 2012-11-29 DIAGNOSIS — N19 Unspecified kidney failure: Secondary | ICD-10-CM

## 2012-11-29 DIAGNOSIS — I10 Essential (primary) hypertension: Secondary | ICD-10-CM

## 2012-11-29 DIAGNOSIS — E871 Hypo-osmolality and hyponatremia: Secondary | ICD-10-CM

## 2012-11-29 DIAGNOSIS — R7881 Bacteremia: Secondary | ICD-10-CM

## 2012-11-29 DIAGNOSIS — F1420 Cocaine dependence, uncomplicated: Secondary | ICD-10-CM

## 2012-11-29 DIAGNOSIS — M2550 Pain in unspecified joint: Secondary | ICD-10-CM

## 2012-11-29 DIAGNOSIS — F319 Bipolar disorder, unspecified: Secondary | ICD-10-CM

## 2012-11-29 DIAGNOSIS — G5790 Unspecified mononeuropathy of unspecified lower limb: Secondary | ICD-10-CM

## 2012-11-29 DIAGNOSIS — G44209 Tension-type headache, unspecified, not intractable: Secondary | ICD-10-CM

## 2012-11-29 DIAGNOSIS — K625 Hemorrhage of anus and rectum: Secondary | ICD-10-CM

## 2012-11-29 DIAGNOSIS — S02609A Fracture of mandible, unspecified, initial encounter for closed fracture: Principal | ICD-10-CM

## 2012-11-29 DIAGNOSIS — S02609S Fracture of mandible, unspecified, sequela: Secondary | ICD-10-CM

## 2012-11-29 HISTORY — PX: ORIF MANDIBULAR FRACTURE: SHX2127

## 2012-11-29 LAB — BASIC METABOLIC PANEL
BUN: 13 mg/dL (ref 6–23)
CO2: 28 mEq/L (ref 19–32)
Calcium: 9.6 mg/dL (ref 8.4–10.5)
Chloride: 102 mEq/L (ref 96–112)
Creatinine, Ser: 0.77 mg/dL (ref 0.50–1.35)
GFR calc Af Amer: >90 mL/min (ref >90)
GFR calc non Af Amer: >90 mL/min (ref >90)
Glucose, Bld: 86 mg/dL (ref 70–99)
Potassium: 4 mEq/L (ref 3.5–5.1)
Sodium: 139 mEq/L (ref 135–145)

## 2012-11-29 LAB — CBC WITH DIFFERENTIAL/PLATELET
Basophils Absolute: 0 10*3/uL (ref 0.0–0.1)
Basophils Relative: 0 % (ref 0–1)
Eosinophils Absolute: 0.4 10*3/uL (ref 0.0–0.7)
Eosinophils Relative: 5 % (ref 0–5)
HCT: 41 % (ref 39.0–52.0)
Hemoglobin: 14 g/dL (ref 13.0–17.0)
Lymphocytes Relative: 14 % (ref 12–46)
Lymphs Abs: 1.2 10*3/uL (ref 0.7–4.0)
MCH: 32.9 pg (ref 26.0–34.0)
MCHC: 34.1 g/dL (ref 30.0–36.0)
MCV: 96.5 fL (ref 78.0–100.0)
Monocytes Absolute: 0.9 10*3/uL (ref 0.1–1.0)
Monocytes Relative: 10 % (ref 3–12)
Neutro Abs: 6.1 10*3/uL (ref 1.7–7.7)
Neutrophils Relative %: 71 % (ref 43–77)
Platelets: 135 10*3/uL — ABNORMAL LOW (ref 150–400)
RBC: 4.25 MIL/uL (ref 4.22–5.81)
RDW: 13.5 % (ref 11.5–15.5)
WBC: 8.6 10*3/uL (ref 4.0–10.5)

## 2012-11-29 LAB — PROTIME-INR
INR: 1.09 (ref 0.00–1.49)
Prothrombin Time: 13.9 seconds (ref 11.6–15.2)

## 2012-11-29 SURGERY — OPEN REDUCTION INTERNAL FIXATION (ORIF) MANDIBULAR FRACTURE
Anesthesia: General | Site: Mouth | Laterality: Right

## 2012-11-29 MED ORDER — CLINDAMYCIN HCL 300 MG PO CAPS
300.0000 mg | ORAL_CAPSULE | Freq: Three times a day (TID) | ORAL | Status: DC
  Administered 2012-11-29 – 2012-11-30 (×3): 300 mg via ORAL
  Filled 2012-11-29 (×5): qty 1

## 2012-11-29 MED ORDER — HYDROMORPHONE HCL PF 1 MG/ML IJ SOLN
0.2500 mg | INTRAMUSCULAR | Status: DC | PRN
  Administered 2012-11-29 (×2): 0.5 mg via INTRAVENOUS

## 2012-11-29 MED ORDER — DARUNAVIR ETHANOLATE 800 MG PO TABS
800.0000 mg | ORAL_TABLET | Freq: Every day | ORAL | Status: DC
  Administered 2012-11-30: 800 mg via ORAL
  Filled 2012-11-29 (×2): qty 1

## 2012-11-29 MED ORDER — HYDROCODONE-ACETAMINOPHEN 5-325 MG PO TABS
1.0000 | ORAL_TABLET | ORAL | Status: DC | PRN
  Administered 2012-11-29 – 2012-11-30 (×3): 1 via ORAL
  Filled 2012-11-29 (×3): qty 1

## 2012-11-29 MED ORDER — BACITRACIN-NEOMYCIN-POLYMYXIN 400-5-5000 EX OINT
TOPICAL_OINTMENT | CUTANEOUS | Status: AC
  Filled 2012-11-29: qty 1

## 2012-11-29 MED ORDER — LIDOCAINE-EPINEPHRINE 1 %-1:100000 IJ SOLN
INTRAMUSCULAR | Status: AC
  Filled 2012-11-29: qty 1

## 2012-11-29 MED ORDER — GABAPENTIN 100 MG PO CAPS
100.0000 mg | ORAL_CAPSULE | Freq: Three times a day (TID) | ORAL | Status: DC
  Administered 2012-11-29 – 2012-11-30 (×3): 100 mg via ORAL
  Filled 2012-11-29 (×4): qty 1

## 2012-11-29 MED ORDER — MIDAZOLAM HCL 5 MG/5ML IJ SOLN
INTRAMUSCULAR | Status: DC | PRN
  Administered 2012-11-29: 1 mg via INTRAVENOUS
  Administered 2012-11-29: 2 mg via INTRAVENOUS

## 2012-11-29 MED ORDER — DEXTROSE-NACL 5-0.9 % IV SOLN: INTRAVENOUS | Status: DC

## 2012-11-29 MED ORDER — PANTOPRAZOLE SODIUM 40 MG PO TBEC
40.0000 mg | DELAYED_RELEASE_TABLET | Freq: Every day | ORAL | Status: DC
  Administered 2012-11-30: 40 mg via ORAL
  Filled 2012-11-29: qty 1

## 2012-11-29 MED ORDER — LACTATED RINGERS IV SOLN
INTRAVENOUS | Status: DC | PRN
  Administered 2012-11-29 (×2): via INTRAVENOUS

## 2012-11-29 MED ORDER — PHENYLEPHRINE HCL 10 MG/ML IJ SOLN
10.0000 mg | INTRAVENOUS | Status: DC | PRN
  Administered 2012-11-29: 10 ug/min via INTRAVENOUS

## 2012-11-29 MED ORDER — HYDROMORPHONE HCL PF 1 MG/ML IJ SOLN: 1.0000 mg | INTRAMUSCULAR | Status: AC | PRN

## 2012-11-29 MED ORDER — HYDROMORPHONE HCL PF 1 MG/ML IJ SOLN
INTRAMUSCULAR | Status: AC
  Filled 2012-11-29: qty 1

## 2012-11-29 MED ORDER — PHENYLEPHRINE HCL 10 MG/ML IJ SOLN
INTRAMUSCULAR | Status: DC | PRN
  Administered 2012-11-29 (×3): 80 ug via INTRAVENOUS

## 2012-11-29 MED ORDER — RISPERIDONE 2 MG PO TABS
2.0000 mg | ORAL_TABLET | Freq: Every day | ORAL | Status: DC
Start: 2012-11-29 — End: 2012-11-30
  Administered 2012-11-29: 2 mg via ORAL
  Filled 2012-11-29 (×2): qty 1

## 2012-11-29 MED ORDER — CITALOPRAM HYDROBROMIDE 20 MG PO TABS
20.0000 mg | ORAL_TABLET | Freq: Every day | ORAL | Status: DC
  Administered 2012-11-29: 20 mg via ORAL
  Filled 2012-11-29 (×2): qty 1

## 2012-11-29 MED ORDER — ONDANSETRON HCL 4 MG/2ML IJ SOLN: 4.0000 mg | INTRAMUSCULAR | Status: DC | PRN

## 2012-11-29 MED ORDER — CLINDAMYCIN HCL 300 MG PO CAPS: 300.0000 mg | ORAL_CAPSULE | Freq: Three times a day (TID) | ORAL | Status: DC

## 2012-11-29 MED ORDER — LIDOCAINE HCL (CARDIAC) 20 MG/ML IV SOLN
INTRAVENOUS | Status: DC | PRN
  Administered 2012-11-29: 80 mg via INTRAVENOUS

## 2012-11-29 MED ORDER — ALBUTEROL SULFATE HFA 108 (90 BASE) MCG/ACT IN AERS
2.0000 | INHALATION_SPRAY | Freq: Four times a day (QID) | RESPIRATORY_TRACT | Status: DC | PRN
  Filled 2012-11-29: qty 6.7

## 2012-11-29 MED ORDER — ONDANSETRON 4 MG PO TBDP: 4.0000 mg | ORAL_TABLET | Freq: Three times a day (TID) | ORAL | Status: DC | PRN

## 2012-11-29 MED ORDER — SODIUM CHLORIDE 0.9 % IV SOLN: INTRAVENOUS | Status: AC

## 2012-11-29 MED ORDER — ROCURONIUM BROMIDE 100 MG/10ML IV SOLN
INTRAVENOUS | Status: DC | PRN
  Administered 2012-11-29: 5 mg via INTRAVENOUS

## 2012-11-29 MED ORDER — ONDANSETRON HCL 4 MG PO TABS: 4.0000 mg | ORAL_TABLET | ORAL | Status: DC | PRN

## 2012-11-29 MED ORDER — ONDANSETRON HCL 4 MG/2ML IJ SOLN
INTRAMUSCULAR | Status: DC | PRN
  Administered 2012-11-29: 4 mg via INTRAVENOUS

## 2012-11-29 MED ORDER — OXYMETAZOLINE HCL 0.05 % NA SOLN
NASAL | Status: AC
  Filled 2012-11-29: qty 15

## 2012-11-29 MED ORDER — ONDANSETRON HCL 4 MG/2ML IJ SOLN: 4.0000 mg | Freq: Three times a day (TID) | INTRAMUSCULAR | Status: DC | PRN

## 2012-11-29 MED ORDER — CLINDAMYCIN PHOSPHATE 900 MG/50ML IV SOLN
INTRAVENOUS | Status: AC
  Filled 2012-11-29: qty 50

## 2012-11-29 MED ORDER — PROMETHAZINE HCL 25 MG/ML IJ SOLN: 6.2500 mg | INTRAMUSCULAR | Status: DC | PRN

## 2012-11-29 MED ORDER — RITONAVIR 100 MG PO TABS
100.0000 mg | ORAL_TABLET | Freq: Every day | ORAL | Status: DC
  Administered 2012-11-30: 100 mg via ORAL
  Filled 2012-11-29 (×2): qty 1

## 2012-11-29 MED ORDER — LIDOCAINE HCL 4 % MT SOLN
OROMUCOSAL | Status: DC | PRN
  Administered 2012-11-29: 4 mL via TOPICAL

## 2012-11-29 MED ORDER — SUFENTANIL CITRATE 50 MCG/ML IV SOLN
INTRAVENOUS | Status: DC | PRN
  Administered 2012-11-29: 15 ug via INTRAVENOUS
  Administered 2012-11-29: 10 ug via INTRAVENOUS

## 2012-11-29 MED ORDER — ATOVAQUONE 750 MG/5ML PO SUSP
1500.0000 mg | Freq: Every day | ORAL | Status: DC
  Administered 2012-11-30: 1500 mg via ORAL
  Filled 2012-11-29: qty 10

## 2012-11-29 MED ORDER — PROPOFOL 10 MG/ML IV BOLUS
INTRAVENOUS | Status: DC | PRN
  Administered 2012-11-29: 250 mg via INTRAVENOUS

## 2012-11-29 MED ORDER — HYDROCODONE-ACETAMINOPHEN 7.5-325 MG PO TABS: 1.0000 | ORAL_TABLET | Freq: Four times a day (QID) | ORAL | Status: DC | PRN

## 2012-11-29 MED ORDER — LACTATED RINGERS IV SOLN: INTRAVENOUS | Status: DC

## 2012-11-29 MED ORDER — ABACAVIR SULFATE-LAMIVUDINE 600-300 MG PO TABS
1.0000 | ORAL_TABLET | Freq: Every day | ORAL | Status: DC
  Filled 2012-11-29: qty 1

## 2012-11-29 MED ORDER — IBUPROFEN 100 MG/5ML PO SUSP
400.0000 mg | Freq: Four times a day (QID) | ORAL | Status: DC | PRN
  Filled 2012-11-29: qty 20

## 2012-11-29 MED ORDER — CLINDAMYCIN PHOSPHATE 900 MG/50ML IV SOLN
900.0000 mg | Freq: Once | INTRAVENOUS | Status: AC
  Administered 2012-11-29: 900 mg via INTRAVENOUS
  Filled 2012-11-29: qty 50

## 2012-11-29 MED ORDER — FENTANYL CITRATE 0.05 MG/ML IJ SOLN: INTRAMUSCULAR | Status: DC | PRN

## 2012-11-29 MED ORDER — VITAMIN C 500 MG PO TABS
500.0000 mg | ORAL_TABLET | Freq: Every day | ORAL | Status: DC
Start: 2012-11-30 — End: 2012-11-30
  Administered 2012-11-30: 500 mg via ORAL
  Filled 2012-11-29: qty 1

## 2012-11-29 MED ORDER — SUCCINYLCHOLINE CHLORIDE 20 MG/ML IJ SOLN
INTRAMUSCULAR | Status: DC | PRN
  Administered 2012-11-29: 140 mg via INTRAVENOUS

## 2012-11-29 MED ORDER — LIDOCAINE-EPINEPHRINE 2 %-1:100000 IJ SOLN
INTRAMUSCULAR | Status: AC
  Filled 2012-11-29: qty 10.2

## 2012-11-29 SURGICAL SUPPLY — 44 items
ATTRACTOMAT 16X20 MAGNETIC DRP (DRAPES) ×1 IMPLANT
BIT DRILL TWIST 1.3X35MM (BIT) IMPLANT
BLADE SURG 15 STRL LF DISP TIS (BLADE) ×2 IMPLANT
BLADE SURG 15 STRL SS (BLADE) ×4
CLEANER TIP ELECTROSURG 2X2 (MISCELLANEOUS) ×2 IMPLANT
CLOTH BEACON ORANGE TIMEOUT ST (SAFETY) ×2 IMPLANT
COVER SURGICAL LIGHT HANDLE (MISCELLANEOUS) ×2 IMPLANT
DEPRESSOR TONGUE BLADE STERILE (MISCELLANEOUS) ×1 IMPLANT
DRILL TWIST 1.3X35MM (BIT) ×2
ELECT COATED BLADE 2.86 ST (ELECTRODE) ×2 IMPLANT
ELECT NDL TIP 2.8 STRL (NEEDLE) ×1 IMPLANT
ELECT NEEDLE TIP 2.8 STRL (NEEDLE) IMPLANT
ELECT REM PT RETURN 9FT ADLT (ELECTROSURGICAL) ×2
ELECTRODE REM PT RTRN 9FT ADLT (ELECTROSURGICAL) ×1 IMPLANT
GAUZE SPONGE 4X4 16PLY XRAY LF (GAUZE/BANDAGES/DRESSINGS) ×2 IMPLANT
KIT BASIN OR (CUSTOM PROCEDURE TRAY) ×2 IMPLANT
NDL HYPO 25X1 1.5 SAFETY (NEEDLE) ×1 IMPLANT
NEEDLE HYPO 25X1 1.5 SAFETY (NEEDLE) ×2 IMPLANT
NS IRRIG 1000ML POUR BTL (IV SOLUTION) ×2 IMPLANT
PACK EENT SPLIT (PACKS) ×2 IMPLANT
PACKING VAGINAL (PACKING) ×1 IMPLANT
PENCIL BUTTON HOLSTER BLD 10FT (ELECTRODE) ×2 IMPLANT
PLATE MNDBLE FRACTURE 4H (Plate) ×1 IMPLANT
RUBBERBAND STERILE (MISCELLANEOUS) ×1 IMPLANT
SCREW MNDBLE 2.0X6 BONE (Screw) ×4 IMPLANT
SOL PREP POV-IOD 16OZ 10% (MISCELLANEOUS) ×2 IMPLANT
SPONGE GAUZE 4X4 12PLY (GAUZE/BANDAGES/DRESSINGS) ×1 IMPLANT
SUT BONE WAX W31G (SUTURE) ×2 IMPLANT
SUT STEEL 0 (SUTURE)
SUT STEEL 0 18XMFL TIE 12 (SUTURE)
SUT STEEL 2 (SUTURE) ×1 IMPLANT
SUT VIC AB 3-0 PS2 18 (SUTURE) ×2
SUT VIC AB 3-0 PS2 18XBRD (SUTURE) ×1 IMPLANT
SUT VIC AB 4-0 P-3 18XBRD (SUTURE) ×1 IMPLANT
SUT VIC AB 4-0 P3 18 (SUTURE)
SUT VIC AB 5-0 P-3 18XBRD (SUTURE) ×1 IMPLANT
SUT VIC AB 5-0 P3 18 (SUTURE)
SUTURE STEEL 0 18XMFL TIE 12 (SUTURE) ×1 IMPLANT
SYR 50ML LL SCALE MARK (SYRINGE) ×2 IMPLANT
SYR BULB IRRIGATION 50ML (SYRINGE) ×2 IMPLANT
TEMPLATE PLATE MANDIBUL NS 6H (Plate) ×1 IMPLANT
TOOTHBRUSH ADULT (PERSONAL CARE ITEMS) ×2 IMPLANT
TOWEL OR 17X26 10 PK STRL BLUE (TOWEL DISPOSABLE) ×2 IMPLANT
WATER STERILE IRR 1500ML POUR (IV SOLUTION) ×2 IMPLANT

## 2012-11-29 NOTE — Anesthesia Postprocedure Evaluation (Signed)
Anesthesia Post Note  Patient: [name]  Procedure(s) Performed: Procedure(s) (LRB): OPEN REDUCTION INTERNAL FIXATION (ORIF) MANDIBULAR FRACTURE (Right)  Anesthesia type: General  Patient location: PACU  Post pain: Pain level controlled  Post assessment: Post-op Vital signs reviewed  Last Vitals:  Filed Vitals:   11/29/12 2222  BP: 158/98  Pulse: 72  Temp: 36.6 C  Resp: 14    Post vital signs: Reviewed  Level of consciousness: sedated  Complications: No apparent anesthesia complications

## 2012-11-29 NOTE — Anesthesia Preprocedure Evaluation (Addendum)
Anesthesia Evaluation  Patient identified by MRN, date of birth, ID band Patient awake    Reviewed: Allergy & Precautions, H&P , NPO status , Patient's Chart, lab work & pertinent test results  Airway Mallampati: II TM Distance: >3 FB Neck ROM: Full    Dental  (+) Edentulous Upper and Edentulous Lower   Pulmonary asthma , COPDCurrent Smoker,    Pulmonary exam normal       Cardiovascular hypertension, Rhythm:Regular Rate:Normal     Neuro/Psych  Headaches, Depression Bipolar Disorder  Neuromuscular disease    GI/Hepatic negative GI ROS, (+)     substance abuse  alcohol use and cocaine use, Hepatitis -, C  Endo/Other  negative endocrine ROS  Renal/GU Renal disease  negative genitourinary   Musculoskeletal negative musculoskeletal ROS (+)   Abdominal   Peds negative pediatric ROS (+)  Hematology  (+) Blood dyscrasia, anemia , HIV,   Anesthesia Other Findings   Reproductive/Obstetrics                         Anesthesia Physical Anesthesia Plan  ASA: III  Anesthesia Plan: General   Post-op Pain Management:    Induction: Intravenous  Airway Management Planned: Oral ETT  Additional Equipment:   Intra-op Plan:   Post-operative Plan: Extubation in OR  Informed Consent: I have reviewed the patients History and Physical, chart, labs and discussed the procedure including the risks, benefits and alternatives for the proposed anesthesia with the patient or authorized representative who has indicated his/her understanding and acceptance.   Dental advisory given  Plan Discussed with: CRNA  Anesthesia Plan Comments:         Anesthesia Quick Evaluation
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
## 2012-11-29 NOTE — ED Notes (Signed)
Pt sent to preop.

## 2012-11-29 NOTE — Transfer of Care (Signed)
Immediate Anesthesia Transfer of Care Note  Patient: [name]  Procedure(s) Performed: Procedure(s) with comments: OPEN REDUCTION INTERNAL FIXATION (ORIF) MANDIBULAR FRACTURE (Right) - right mandible  Patient Location: PACU  Anesthesia Type:General  Level of Consciousness: awake, alert , oriented, patient cooperative and responds to stimulation  Airway & Oxygen Therapy: Patient Spontanous Breathing and Patient connected to face mask oxygen  Post-op Assessment: Report given to PACU RN, Post -op Vital signs reviewed and stable and Patient moving all extremities X 4  Post vital signs: stable  Complications: No apparent anesthesia complications
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
## 2012-11-29 NOTE — Op Note (Signed)
OPERATIVE REPORT  DATE OF SURGERY: 11/29/2012  PATIENT:  [name],  57 y.o. male  PRE-OPERATIVE DIAGNOSIS:  RIGHT MANDIBLE fracture  POST-OPERATIVE DIAGNOSIS:  right mandible fracture  PROCEDURE:  Procedure(s): OPEN REDUCTION INTERNAL FIXATION (ORIF) MANDIBULAR FRACTURE  SURGEON:  Susy Frizzle, MD  ASSISTANTS: None   ANESTHESIA:   General   EBL:  30 ml  DRAINS: None   LOCAL MEDICATIONS USED:  None  SPECIMEN:  none  COUNTS:  Correct  PROCEDURE DETAILS: The patient was taken to the operating room and placed on the operating table in the supine position. Following induction of endotracheal anesthesia, the face was prepped and draped in a standard fashion. An oral tube was used because of the lack of dentition. Electrocautery was used to incise the oral mucosa along the right lateral buckle area. Dissection continued down to the mandible. The fracture was exposed along the angle of the mandible. It was an angulated fracture. There were some comminuted fragments but the main fracture was in one piece. Bone was exposed and the fracture was reduced using bone reduction clamps. A 2.0 mm 4 hole spacer straight mandibular fracture plate was used. 4 screws were used, 2 on either side of the fracture. 4, 6, 6, and 8 mm screws were used. There was nice fixation and good reduction. The wound was irrigated with saline and suctioned. The incision was reapproximated with running 3-0 Vicryl suture. The patient was then awakened, extubated and transferred to recovery in stable condition.    PATIENT DISPOSITION:  To PACU, stable

## 2012-11-29 NOTE — Progress Notes (Signed)
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

## 2012-11-30 DIAGNOSIS — S0291XS Unspecified fracture of skull, sequela: Secondary | ICD-10-CM

## 2012-11-30 DIAGNOSIS — B20 Human immunodeficiency virus [HIV] disease: Secondary | ICD-10-CM

## 2012-11-30 DIAGNOSIS — F329 Major depressive disorder, single episode, unspecified: Secondary | ICD-10-CM

## 2012-11-30 DIAGNOSIS — J45909 Unspecified asthma, uncomplicated: Secondary | ICD-10-CM

## 2012-11-30 MED ORDER — ABACAVIR SULFATE 300 MG PO TABS
600.0000 mg | ORAL_TABLET | Freq: Every day | ORAL | Status: DC
  Administered 2012-11-30: 600 mg via ORAL
  Filled 2012-11-30: qty 2

## 2012-11-30 MED ORDER — LAMIVUDINE 150 MG PO TABS
300.0000 mg | ORAL_TABLET | Freq: Every day | ORAL | Status: DC
Start: 2012-11-30 — End: 2012-11-30
  Administered 2012-11-30: 300 mg via ORAL
  Filled 2012-11-30: qty 2

## 2012-11-30 MED ORDER — MELOXICAM 7.5 MG PO TABS: 7.5000 mg | ORAL_TABLET | Freq: Every day | ORAL | Status: DC

## 2012-11-30 MED ORDER — ACETAMINOPHEN 325 MG PO TABS: 650.0000 mg | ORAL_TABLET | Freq: Four times a day (QID) | ORAL | Status: DC | PRN

## 2012-11-30 NOTE — Progress Notes (Signed)
CSW spoke with CM at [facility] regarding pt and d/c planning. Pt will need to be independent with ADL's to return to their housing program. They do not allow narcotic use . Transportation back to [facility] can be provided by [facility] [phone] ) . CSW will follow to assist with d/c planning needs.  Cori Razor LCSW [phone]
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
## 2012-11-30 NOTE — Progress Notes (Signed)
Utilization review completed.  
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
## 2012-12-05 ENCOUNTER — Encounter (HOSPITAL_COMMUNITY): Payer: Self-pay | Admitting: Otolaryngology

## 2012-12-06 ENCOUNTER — Ambulatory Visit: Payer: Medicare Other | Admitting: Infectious Disease

## 2012-12-19 ENCOUNTER — Other Ambulatory Visit: Payer: Self-pay | Admitting: Infectious Disease

## 2012-12-20 ENCOUNTER — Ambulatory Visit: Payer: Self-pay | Admitting: Infectious Disease

## 2013-01-09 ENCOUNTER — Telehealth: Payer: Self-pay | Admitting: *Deleted

## 2013-01-09 NOTE — Telephone Encounter (Signed)
Patient has a nurse that he sees where he lives and she has suggested he needs a ppd because it has been over a year since his last one. Explained that unless he had symptoms the one from 2013 should be good. He does have an appt with Dr. Daiva Eves on 01/22/13 and advised him to discuss this with his MD at that time. [name]

## 2013-01-23 ENCOUNTER — Ambulatory Visit: Payer: Self-pay | Admitting: Infectious Disease

## 2013-02-15 ENCOUNTER — Other Ambulatory Visit: Payer: Self-pay | Admitting: Infectious Disease

## 2013-03-22 ENCOUNTER — Emergency Department (HOSPITAL_COMMUNITY)
Admission: EM | Admit: 2013-03-22 | Discharge: 2013-03-23 | Disposition: A | Discharge status: Home / Self Care | Payer: PRIVATE HEALTH INSURANCE | Attending: Emergency Medicine | Admitting: Emergency Medicine

## 2013-03-22 ENCOUNTER — Encounter (HOSPITAL_COMMUNITY): Payer: Self-pay | Admitting: Emergency Medicine

## 2013-03-22 DIAGNOSIS — J4489 Other specified chronic obstructive pulmonary disease: Secondary | ICD-10-CM | POA: Insufficient documentation

## 2013-03-22 DIAGNOSIS — R4689 Other symptoms and signs involving appearance and behavior: Secondary | ICD-10-CM

## 2013-03-22 DIAGNOSIS — Y849 Medical procedure, unspecified as the cause of abnormal reaction of the patient, or of later complication, without mention of misadventure at the time of the procedure: Secondary | ICD-10-CM | POA: Insufficient documentation

## 2013-03-22 DIAGNOSIS — F17200 Nicotine dependence, unspecified, uncomplicated: Secondary | ICD-10-CM | POA: Insufficient documentation

## 2013-03-22 DIAGNOSIS — J449 Chronic obstructive pulmonary disease, unspecified: Secondary | ICD-10-CM | POA: Insufficient documentation

## 2013-03-22 DIAGNOSIS — T8069XA Other serum reaction due to other serum, initial encounter: Secondary | ICD-10-CM | POA: Insufficient documentation

## 2013-03-22 DIAGNOSIS — Z8614 Personal history of Methicillin resistant Staphylococcus aureus infection: Secondary | ICD-10-CM | POA: Insufficient documentation

## 2013-03-22 DIAGNOSIS — Z21 Asymptomatic human immunodeficiency virus [HIV] infection status: Secondary | ICD-10-CM | POA: Insufficient documentation

## 2013-03-22 DIAGNOSIS — M86149 Other acute osteomyelitis, unspecified hand: Secondary | ICD-10-CM | POA: Insufficient documentation

## 2013-03-22 DIAGNOSIS — F911 Conduct disorder, childhood-onset type: Secondary | ICD-10-CM | POA: Insufficient documentation

## 2013-03-22 DIAGNOSIS — Z79899 Other long term (current) drug therapy: Secondary | ICD-10-CM | POA: Insufficient documentation

## 2013-03-22 DIAGNOSIS — Z87448 Personal history of other diseases of urinary system: Secondary | ICD-10-CM | POA: Insufficient documentation

## 2013-03-22 DIAGNOSIS — Z87828 Personal history of other (healed) physical injury and trauma: Secondary | ICD-10-CM | POA: Insufficient documentation

## 2013-03-22 LAB — CBC WITH DIFFERENTIAL/PLATELET
Basophils Absolute: 0 10*3/uL (ref 0.0–0.1)
Basophils Relative: 0 % (ref 0–1)
Eosinophils Absolute: 0.1 10*3/uL (ref 0.0–0.7)
Eosinophils Relative: 2 % (ref 0–5)
HCT: 37.6 % — ABNORMAL LOW (ref 39.0–52.0)
Hemoglobin: 12.8 g/dL — ABNORMAL LOW (ref 13.0–17.0)
Lymphocytes Relative: 27 % (ref 12–46)
Lymphs Abs: 1.8 10*3/uL (ref 0.7–4.0)
MCH: 32.4 pg (ref 26.0–34.0)
MCHC: 34 g/dL (ref 30.0–36.0)
MCV: 95.2 fL (ref 78.0–100.0)
Monocytes Absolute: 0.9 10*3/uL (ref 0.1–1.0)
Monocytes Relative: 13 % — ABNORMAL HIGH (ref 3–12)
Neutro Abs: 3.9 10*3/uL (ref 1.7–7.7)
Neutrophils Relative %: 58 % (ref 43–77)
Platelets: 137 10*3/uL — ABNORMAL LOW (ref 150–400)
RBC: 3.95 MIL/uL — ABNORMAL LOW (ref 4.22–5.81)
RDW: 12.8 % (ref 11.5–15.5)
WBC: 6.7 10*3/uL (ref 4.0–10.5)
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
## 2013-03-22 NOTE — ED Notes (Signed)
Bed: ZO10 Expected date:  Expected time:  Means of arrival:  Comments: EMS/57 yo male intoxicated
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
## 2013-03-23 LAB — COMPREHENSIVE METABOLIC PANEL
ALT: 83 U/L — ABNORMAL HIGH (ref 0–53)
AST: 89 U/L — ABNORMAL HIGH (ref 0–37)
Albumin: 3.3 g/dL — ABNORMAL LOW (ref 3.5–5.2)
Alkaline Phosphatase: 67 U/L (ref 39–117)
BUN: 5 mg/dL — ABNORMAL LOW (ref 6–23)
CO2: 25 mEq/L (ref 19–32)
Calcium: 9.2 mg/dL (ref 8.4–10.5)
Chloride: 100 mEq/L (ref 96–112)
Creatinine, Ser: 0.83 mg/dL (ref 0.50–1.35)
GFR calc Af Amer: >90 mL/min (ref >90)
GFR calc non Af Amer: >90 mL/min (ref >90)
Glucose, Bld: 103 mg/dL — ABNORMAL HIGH (ref 70–99)
Potassium: 3.1 mEq/L — ABNORMAL LOW (ref 3.5–5.1)
Sodium: 135 mEq/L (ref 135–145)
Total Bilirubin: 0.6 mg/dL (ref 0.3–1.2)
Total Protein: 7.7 g/dL (ref 6.0–8.3)

## 2013-03-23 LAB — ETHANOL: Alcohol, Ethyl (B): 276 mg/dL — ABNORMAL HIGH (ref 0–11)

## 2013-03-23 NOTE — ED Provider Notes (Signed)
Medical screening examination/treatment/procedure(s) were performed by non-physician practitioner and as supervising physician I was immediately available for consultation/collaboration.  EKG Interpretation   None         William Antha Niday, MD 03/23/13 0703 
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
## 2013-04-15 ENCOUNTER — Other Ambulatory Visit: Payer: PRIVATE HEALTH INSURANCE

## 2013-04-22 ENCOUNTER — Telehealth: Payer: Self-pay | Admitting: *Deleted

## 2013-04-22 NOTE — Telephone Encounter (Signed)
Patient is requesting a letter stating that he "is able to go to school and able to do the work required for school despite his terminal illness."  Pt states he needs this letter before Thursday 12/4 (pt has an appointment with Dr. Daiva Eves 12/4).  RN returned the phone call and left message offering to reprint the letter Dr. Daiva Eves wrote to this effect in April 2014 or advising pt to bring up this point at his appointment on 12/4. Andree Coss, RN

## 2013-04-24 ENCOUNTER — Ambulatory Visit: Payer: PRIVATE HEALTH INSURANCE | Admitting: Infectious Disease

## 2013-04-24 ENCOUNTER — Emergency Department (HOSPITAL_COMMUNITY)
Admission: EM | Admit: 2013-04-24 | Discharge: 2013-04-24 | Disposition: A | Discharge status: Home / Self Care | Payer: PRIVATE HEALTH INSURANCE | Attending: Emergency Medicine | Admitting: Emergency Medicine

## 2013-04-24 ENCOUNTER — Encounter (HOSPITAL_COMMUNITY): Payer: Self-pay | Admitting: Emergency Medicine

## 2013-04-24 DIAGNOSIS — Z79899 Other long term (current) drug therapy: Secondary | ICD-10-CM | POA: Insufficient documentation

## 2013-04-24 DIAGNOSIS — IMO0002 Reserved for concepts with insufficient information to code with codable children: Secondary | ICD-10-CM | POA: Insufficient documentation

## 2013-04-24 DIAGNOSIS — J4489 Other specified chronic obstructive pulmonary disease: Secondary | ICD-10-CM | POA: Insufficient documentation

## 2013-04-24 DIAGNOSIS — F1010 Alcohol abuse, uncomplicated: Secondary | ICD-10-CM

## 2013-04-24 DIAGNOSIS — Z21 Asymptomatic human immunodeficiency virus [HIV] infection status: Secondary | ICD-10-CM | POA: Insufficient documentation

## 2013-04-24 DIAGNOSIS — F17200 Nicotine dependence, unspecified, uncomplicated: Secondary | ICD-10-CM | POA: Insufficient documentation

## 2013-04-24 DIAGNOSIS — J449 Chronic obstructive pulmonary disease, unspecified: Secondary | ICD-10-CM | POA: Insufficient documentation

## 2013-04-24 LAB — RAPID URINE DRUG SCREEN, HOSP PERFORMED
Amphetamines: NOT DETECTED
Barbiturates: NOT DETECTED
Benzodiazepines: NOT DETECTED
Cocaine: POSITIVE — AB
Opiates: NOT DETECTED
Tetrahydrocannabinol: NOT DETECTED

## 2013-04-24 LAB — COMPREHENSIVE METABOLIC PANEL
ALT: 82 U/L — ABNORMAL HIGH (ref 0–53)
AST: 107 U/L — ABNORMAL HIGH (ref 0–37)
Albumin: 3.8 g/dL (ref 3.5–5.2)
Alkaline Phosphatase: 69 U/L (ref 39–117)
BUN: 9 mg/dL (ref 6–23)
CO2: 19 mEq/L (ref 19–32)
Calcium: 9.3 mg/dL (ref 8.4–10.5)
Chloride: 102 mEq/L (ref 96–112)
Creatinine, Ser: 1.14 mg/dL (ref 0.50–1.35)
GFR calc Af Amer: 80 mL/min — ABNORMAL LOW (ref >90)
GFR calc non Af Amer: 69 mL/min — ABNORMAL LOW (ref >90)
Glucose, Bld: 108 mg/dL — ABNORMAL HIGH (ref 70–99)
Potassium: 3.3 mEq/L — ABNORMAL LOW (ref 3.5–5.1)
Sodium: 134 mEq/L — ABNORMAL LOW (ref 135–145)
Total Bilirubin: 0.6 mg/dL (ref 0.3–1.2)
Total Protein: 9.4 g/dL — ABNORMAL HIGH (ref 6.0–8.3)

## 2013-04-24 LAB — CBC
HCT: 42.6 % (ref 39.0–52.0)
Hemoglobin: 14.6 g/dL (ref 13.0–17.0)
MCH: 32.6 pg (ref 26.0–34.0)
MCHC: 34.3 g/dL (ref 30.0–36.0)
MCV: 95.1 fL (ref 78.0–100.0)
Platelets: 160 10*3/uL (ref 150–400)
RBC: 4.48 MIL/uL (ref 4.22–5.81)
RDW: 13.1 % (ref 11.5–15.5)
WBC: 10 10*3/uL (ref 4.0–10.5)

## 2013-04-24 LAB — SALICYLATE LEVEL: Salicylate Lvl: <2 mg/dL — ABNORMAL LOW (ref 2.8–20.0)

## 2013-04-24 LAB — ACETAMINOPHEN LEVEL: Acetaminophen (Tylenol), Serum: <15 ug/mL (ref 10–30)

## 2013-04-24 LAB — ETHANOL: Alcohol, Ethyl (B): 147 mg/dL — ABNORMAL HIGH (ref 0–11)
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
## 2013-04-24 NOTE — ED Notes (Signed)
Pt being very loud in triage. Pt ask repeatedly to lower voice.

## 2013-04-24 NOTE — ED Notes (Signed)
Dr. Jodi Mourning spoke with patient and pts sister. Pt requesting to leave at this time. Pt given belongings .

## 2013-04-24 NOTE — ED Notes (Signed)
Pt states he is ready for rehab for etoh and crack. Pt states he consumed 3 six packs of Smirnoff, 3 40oz beers and $1,000 worth of crack

## 2013-04-25 ENCOUNTER — Ambulatory Visit: Payer: PRIVATE HEALTH INSURANCE | Admitting: Infectious Disease

## 2013-04-25 ENCOUNTER — Emergency Department (HOSPITAL_COMMUNITY)
Admission: EM | Admit: 2013-04-25 | Discharge: 2013-04-26 | Disposition: A | Discharge status: Other Acute Inpatient Hospital | Payer: PRIVATE HEALTH INSURANCE | Attending: Emergency Medicine | Admitting: Emergency Medicine

## 2013-04-25 ENCOUNTER — Emergency Department (HOSPITAL_COMMUNITY): Payer: PRIVATE HEALTH INSURANCE

## 2013-04-25 ENCOUNTER — Encounter (HOSPITAL_COMMUNITY): Payer: Self-pay | Admitting: Emergency Medicine

## 2013-04-25 DIAGNOSIS — Z8619 Personal history of other infectious and parasitic diseases: Secondary | ICD-10-CM | POA: Insufficient documentation

## 2013-04-25 DIAGNOSIS — F1910 Other psychoactive substance abuse, uncomplicated: Secondary | ICD-10-CM

## 2013-04-25 DIAGNOSIS — R059 Cough, unspecified: Secondary | ICD-10-CM | POA: Insufficient documentation

## 2013-04-25 DIAGNOSIS — J4489 Other specified chronic obstructive pulmonary disease: Secondary | ICD-10-CM | POA: Insufficient documentation

## 2013-04-25 DIAGNOSIS — J449 Chronic obstructive pulmonary disease, unspecified: Secondary | ICD-10-CM | POA: Insufficient documentation

## 2013-04-25 DIAGNOSIS — Z21 Asymptomatic human immunodeficiency virus [HIV] infection status: Secondary | ICD-10-CM | POA: Insufficient documentation

## 2013-04-25 DIAGNOSIS — Z79899 Other long term (current) drug therapy: Secondary | ICD-10-CM | POA: Insufficient documentation

## 2013-04-25 DIAGNOSIS — R05 Cough: Secondary | ICD-10-CM | POA: Insufficient documentation

## 2013-04-25 DIAGNOSIS — Z87828 Personal history of other (healed) physical injury and trauma: Secondary | ICD-10-CM | POA: Insufficient documentation

## 2013-04-25 DIAGNOSIS — F17200 Nicotine dependence, unspecified, uncomplicated: Secondary | ICD-10-CM | POA: Insufficient documentation

## 2013-04-25 DIAGNOSIS — F1010 Alcohol abuse, uncomplicated: Secondary | ICD-10-CM

## 2013-04-25 DIAGNOSIS — R110 Nausea: Secondary | ICD-10-CM | POA: Insufficient documentation

## 2013-04-25 LAB — ETHANOL: Alcohol, Ethyl (B): 171 mg/dL — ABNORMAL HIGH (ref 0–11)

## 2013-04-25 LAB — COMPREHENSIVE METABOLIC PANEL
ALT: 80 U/L — ABNORMAL HIGH (ref 0–53)
AST: 131 U/L — ABNORMAL HIGH (ref 0–37)
Albumin: 3.6 g/dL (ref 3.5–5.2)
Alkaline Phosphatase: 65 U/L (ref 39–117)
BUN: 10 mg/dL (ref 6–23)
CO2: 20 mEq/L (ref 19–32)
Calcium: 8.7 mg/dL (ref 8.4–10.5)
Chloride: 105 mEq/L (ref 96–112)
Creatinine, Ser: 0.87 mg/dL (ref 0.50–1.35)
GFR calc Af Amer: >90 mL/min (ref >90)
GFR calc non Af Amer: >90 mL/min (ref >90)
Glucose, Bld: 89 mg/dL (ref 70–99)
Potassium: 3.8 mEq/L (ref 3.5–5.1)
Sodium: 137 mEq/L (ref 135–145)
Total Bilirubin: 0.6 mg/dL (ref 0.3–1.2)
Total Protein: 8.7 g/dL — ABNORMAL HIGH (ref 6.0–8.3)

## 2013-04-25 LAB — RAPID URINE DRUG SCREEN, HOSP PERFORMED
Amphetamines: NOT DETECTED
Barbiturates: NOT DETECTED
Benzodiazepines: NOT DETECTED
Cocaine: POSITIVE — AB
Opiates: NOT DETECTED
Tetrahydrocannabinol: NOT DETECTED

## 2013-04-25 LAB — CBC
HCT: 40.4 % (ref 39.0–52.0)
Hemoglobin: 13.7 g/dL (ref 13.0–17.0)
MCH: 31.9 pg (ref 26.0–34.0)
MCHC: 33.9 g/dL (ref 30.0–36.0)
MCV: 94 fL (ref 78.0–100.0)
Platelets: 145 10*3/uL — ABNORMAL LOW (ref 150–400)
RBC: 4.3 MIL/uL (ref 4.22–5.81)
RDW: 13.1 % (ref 11.5–15.5)
WBC: 4.7 10*3/uL (ref 4.0–10.5)

## 2013-04-25 LAB — SALICYLATE LEVEL: Salicylate Lvl: <2 mg/dL — ABNORMAL LOW (ref 2.8–20.0)

## 2013-04-25 LAB — ACETAMINOPHEN LEVEL: Acetaminophen (Tylenol), Serum: <15 ug/mL (ref 10–30)

## 2013-04-25 MED ORDER — ONDANSETRON HCL 4 MG PO TABS
4.0000 mg | ORAL_TABLET | Freq: Three times a day (TID) | ORAL | Status: DC | PRN
  Administered 2013-04-26: 4 mg via ORAL
  Filled 2013-04-25: qty 1

## 2013-04-25 MED ORDER — ABACAVIR SULFATE-LAMIVUDINE 600-300 MG PO TABS
1.0000 | ORAL_TABLET | Freq: Every day | ORAL | Status: DC
  Filled 2013-04-25: qty 1

## 2013-04-25 MED ORDER — GABAPENTIN 100 MG PO CAPS
100.0000 mg | ORAL_CAPSULE | Freq: Three times a day (TID) | ORAL | Status: DC
  Administered 2013-04-25 – 2013-04-26 (×2): 100 mg via ORAL
  Filled 2013-04-25 (×4): qty 1

## 2013-04-25 MED ORDER — RITONAVIR 100 MG PO TABS
100.0000 mg | ORAL_TABLET | Freq: Every day | ORAL | Status: DC
  Administered 2013-04-26: 100 mg via ORAL
  Filled 2013-04-25 (×2): qty 1

## 2013-04-25 MED ORDER — VITAMIN C 500 MG PO TABS
500.0000 mg | ORAL_TABLET | Freq: Every day | ORAL | Status: DC
  Administered 2013-04-25 – 2013-04-26 (×2): 500 mg via ORAL
  Filled 2013-04-25 (×2): qty 1

## 2013-04-25 MED ORDER — ATOVAQUONE 750 MG/5ML PO SUSP
1500.0000 mg | Freq: Every day | ORAL | Status: DC
  Administered 2013-04-26: 1500 mg via ORAL
  Filled 2013-04-25: qty 10

## 2013-04-25 MED ORDER — PANTOPRAZOLE SODIUM 40 MG PO TBEC
40.0000 mg | DELAYED_RELEASE_TABLET | Freq: Every day | ORAL | Status: DC
  Administered 2013-04-25 – 2013-04-26 (×2): 40 mg via ORAL
  Filled 2013-04-25 (×2): qty 1

## 2013-04-25 MED ORDER — RISPERIDONE 2 MG PO TABS
2.0000 mg | ORAL_TABLET | Freq: Every day | ORAL | Status: DC
  Administered 2013-04-25: 2 mg via ORAL
  Filled 2013-04-25: qty 1

## 2013-04-25 MED ORDER — CITALOPRAM HYDROBROMIDE 20 MG PO TABS
20.0000 mg | ORAL_TABLET | Freq: Every day | ORAL | Status: DC
  Administered 2013-04-25 – 2013-04-26 (×2): 20 mg via ORAL
  Filled 2013-04-25 (×2): qty 1

## 2013-04-25 MED ORDER — DARUNAVIR ETHANOLATE 800 MG PO TABS
800.0000 mg | ORAL_TABLET | Freq: Every day | ORAL | Status: DC
  Administered 2013-04-26: 800 mg via ORAL
  Filled 2013-04-25 (×2): qty 1

## 2013-04-25 NOTE — ED Notes (Addendum)
Pt presents today with family with request for alcohol and cocaine detox. Pt reports drinking 3-40's and 12-12oz beers today, last at 1300 today. Pt reports last using cocaine yesterday. Pt denies HI, SI, and visual hallucination, however states he "sometimes hears a radio".

## 2013-04-25 NOTE — ED Notes (Signed)
Pt has two personal belongings bags behind triage desk.
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
## 2013-04-25 NOTE — ED Notes (Addendum)
Pt ambulatory to unit w/ Nt,not assessed by this RN, reports to [place] rn

## 2013-04-25 NOTE — ED Notes (Signed)
Pt unable to urinate at this time.  
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
## 2013-04-25 NOTE — ED Notes (Signed)
Pt transferred from triage, presents for Alcohol detox, reports he drinks 2 1/2/6 packs of beer per day.  Admits to drinking a fifth of liquor today.  Denies SI or HI, no AV hallucinations.  Admits to SI yrs ago.  Pt cooperative but sleepy.

## 2013-04-26 ENCOUNTER — Encounter (HOSPITAL_COMMUNITY): Payer: Self-pay | Admitting: Behavioral Health

## 2013-04-26 ENCOUNTER — Inpatient Hospital Stay (HOSPITAL_COMMUNITY)
Admission: AD | Admit: 2013-04-26 | Discharge: 2013-05-06 | DRG: 896 | Disposition: A | Discharge status: Rehabilitation Facility | Payer: PRIVATE HEALTH INSURANCE | Source: Intra-hospital | Attending: Psychiatry | Admitting: Psychiatry

## 2013-04-26 DIAGNOSIS — N529 Male erectile dysfunction, unspecified: Secondary | ICD-10-CM

## 2013-04-26 DIAGNOSIS — F259 Schizoaffective disorder, unspecified: Secondary | ICD-10-CM | POA: Diagnosis present

## 2013-04-26 DIAGNOSIS — F10239 Alcohol dependence with withdrawal, unspecified: Secondary | ICD-10-CM | POA: Diagnosis present

## 2013-04-26 DIAGNOSIS — F1420 Cocaine dependence, uncomplicated: Secondary | ICD-10-CM

## 2013-04-26 DIAGNOSIS — J449 Chronic obstructive pulmonary disease, unspecified: Secondary | ICD-10-CM | POA: Diagnosis present

## 2013-04-26 DIAGNOSIS — R7881 Bacteremia: Secondary | ICD-10-CM

## 2013-04-26 DIAGNOSIS — Z79899 Other long term (current) drug therapy: Secondary | ICD-10-CM | POA: Diagnosis not present

## 2013-04-26 DIAGNOSIS — D696 Thrombocytopenia, unspecified: Secondary | ICD-10-CM

## 2013-04-26 DIAGNOSIS — D551 Anemia due to other disorders of glutathione metabolism: Secondary | ICD-10-CM

## 2013-04-26 DIAGNOSIS — F1020 Alcohol dependence, uncomplicated: Secondary | ICD-10-CM | POA: Diagnosis present

## 2013-04-26 DIAGNOSIS — F332 Major depressive disorder, recurrent severe without psychotic features: Secondary | ICD-10-CM | POA: Diagnosis present

## 2013-04-26 DIAGNOSIS — J45909 Unspecified asthma, uncomplicated: Secondary | ICD-10-CM

## 2013-04-26 DIAGNOSIS — I10 Essential (primary) hypertension: Secondary | ICD-10-CM

## 2013-04-26 DIAGNOSIS — F10939 Alcohol use, unspecified with withdrawal, unspecified: Secondary | ICD-10-CM | POA: Diagnosis present

## 2013-04-26 DIAGNOSIS — J4489 Other specified chronic obstructive pulmonary disease: Secondary | ICD-10-CM | POA: Diagnosis present

## 2013-04-26 DIAGNOSIS — J438 Other emphysema: Secondary | ICD-10-CM

## 2013-04-26 DIAGNOSIS — F329 Major depressive disorder, single episode, unspecified: Secondary | ICD-10-CM | POA: Diagnosis present

## 2013-04-26 DIAGNOSIS — B20 Human immunodeficiency virus [HIV] disease: Secondary | ICD-10-CM | POA: Diagnosis present

## 2013-04-26 DIAGNOSIS — F17200 Nicotine dependence, unspecified, uncomplicated: Secondary | ICD-10-CM

## 2013-04-26 DIAGNOSIS — D649 Anemia, unspecified: Secondary | ICD-10-CM

## 2013-04-26 DIAGNOSIS — G5790 Unspecified mononeuropathy of unspecified lower limb: Secondary | ICD-10-CM

## 2013-04-26 DIAGNOSIS — F1010 Alcohol abuse, uncomplicated: Secondary | ICD-10-CM | POA: Diagnosis present

## 2013-04-26 DIAGNOSIS — G44209 Tension-type headache, unspecified, not intractable: Secondary | ICD-10-CM

## 2013-04-26 DIAGNOSIS — B182 Chronic viral hepatitis C: Secondary | ICD-10-CM

## 2013-04-26 MED ORDER — PANTOPRAZOLE SODIUM 40 MG PO TBEC
40.0000 mg | DELAYED_RELEASE_TABLET | Freq: Two times a day (BID) | ORAL | Status: DC
  Administered 2013-04-26 – 2013-05-05 (×19): 40 mg via ORAL
  Filled 2013-04-26 (×22): qty 1

## 2013-04-26 MED ORDER — RISPERIDONE 2 MG PO TABS
2.0000 mg | ORAL_TABLET | Freq: Every day | ORAL | Status: DC
  Administered 2013-04-26 – 2013-04-28 (×3): 2 mg via ORAL
  Filled 2013-04-26 (×5): qty 1

## 2013-04-26 MED ORDER — ONDANSETRON 4 MG PO TBDP: 4.0000 mg | ORAL_TABLET | Freq: Four times a day (QID) | ORAL | Status: AC | PRN

## 2013-04-26 MED ORDER — LOPERAMIDE HCL 2 MG PO CAPS: 2.0000 mg | ORAL_CAPSULE | ORAL | Status: AC | PRN

## 2013-04-26 MED ORDER — DARUNAVIR ETHANOLATE 800 MG PO TABS
800.0000 mg | ORAL_TABLET | Freq: Every day | ORAL | Status: DC
  Administered 2013-04-27 – 2013-05-06 (×10): 800 mg via ORAL
  Filled 2013-04-26 (×11): qty 1

## 2013-04-26 MED ORDER — LORAZEPAM 1 MG PO TABS
1.0000 mg | ORAL_TABLET | Freq: Two times a day (BID) | ORAL | Status: AC
  Administered 2013-04-28 (×2): 1 mg via ORAL
  Filled 2013-04-26 (×2): qty 1

## 2013-04-26 MED ORDER — CITALOPRAM HYDROBROMIDE 20 MG PO TABS
20.0000 mg | ORAL_TABLET | Freq: Every day | ORAL | Status: DC
  Administered 2013-04-26 – 2013-05-02 (×7): 20 mg via ORAL
  Filled 2013-04-26 (×10): qty 1

## 2013-04-26 MED ORDER — VITAMIN B-1 100 MG PO TABS
100.0000 mg | ORAL_TABLET | Freq: Every day | ORAL | Status: DC
  Administered 2013-04-27 – 2013-05-05 (×9): 100 mg via ORAL
  Filled 2013-04-26 (×11): qty 1

## 2013-04-26 MED ORDER — OMEGA-3-ACID ETHYL ESTERS 1 G PO CAPS
1.0000 g | ORAL_CAPSULE | Freq: Two times a day (BID) | ORAL | Status: DC
  Administered 2013-04-26 – 2013-05-05 (×19): 1 g via ORAL
  Filled 2013-04-26 (×23): qty 1

## 2013-04-26 MED ORDER — HYDROXYZINE HCL 25 MG PO TABS: 25.0000 mg | ORAL_TABLET | Freq: Four times a day (QID) | ORAL | Status: AC | PRN

## 2013-04-26 MED ORDER — LORAZEPAM 1 MG PO TABS
1.0000 mg | ORAL_TABLET | Freq: Every day | ORAL | Status: AC
  Administered 2013-04-29: 1 mg via ORAL
  Filled 2013-04-26: qty 1

## 2013-04-26 MED ORDER — LAMIVUDINE 150 MG PO TABS
300.0000 mg | ORAL_TABLET | Freq: Every day | ORAL | Status: DC
  Administered 2013-04-27 – 2013-05-05 (×9): 300 mg via ORAL
  Filled 2013-04-26 (×11): qty 2

## 2013-04-26 MED ORDER — LAMIVUDINE 150 MG PO TABS
300.0000 mg | ORAL_TABLET | Freq: Every day | ORAL | Status: DC
Start: 2013-04-26 — End: 2013-04-26
  Administered 2013-04-26: 300 mg via ORAL
  Filled 2013-04-26: qty 2

## 2013-04-26 MED ORDER — ABACAVIR SULFATE 300 MG PO TABS
600.0000 mg | ORAL_TABLET | Freq: Every day | ORAL | Status: DC
  Administered 2013-04-26: 600 mg via ORAL
  Filled 2013-04-26: qty 2

## 2013-04-26 MED ORDER — LORAZEPAM 1 MG PO TABS
1.0000 mg | ORAL_TABLET | Freq: Three times a day (TID) | ORAL | Status: AC
  Administered 2013-04-27 (×3): 1 mg via ORAL
  Filled 2013-04-26 (×2): qty 1

## 2013-04-26 MED ORDER — ADULT MULTIVITAMIN W/MINERALS CH
1.0000 | ORAL_TABLET | Freq: Every day | ORAL | Status: DC
  Administered 2013-04-26 – 2013-05-05 (×10): 1 via ORAL
  Filled 2013-04-26 (×13): qty 1

## 2013-04-26 MED ORDER — THIAMINE HCL 100 MG/ML IJ SOLN: 100.0000 mg | Freq: Once | INTRAMUSCULAR | Status: DC

## 2013-04-26 MED ORDER — ABACAVIR SULFATE 300 MG PO TABS
600.0000 mg | ORAL_TABLET | Freq: Every day | ORAL | Status: DC
  Administered 2013-04-27 – 2013-05-05 (×9): 600 mg via ORAL
  Filled 2013-04-26 (×11): qty 2

## 2013-04-26 MED ORDER — ATOVAQUONE 750 MG/5ML PO SUSP
1500.0000 mg | Freq: Every day | ORAL | Status: DC
  Administered 2013-04-27 – 2013-05-05 (×9): 1500 mg via ORAL
  Filled 2013-04-26 (×11): qty 10

## 2013-04-26 MED ORDER — RITONAVIR 100 MG PO TABS
100.0000 mg | ORAL_TABLET | Freq: Every day | ORAL | Status: DC
  Administered 2013-04-27 – 2013-05-06 (×10): 100 mg via ORAL
  Filled 2013-04-26 (×11): qty 1

## 2013-04-26 MED ORDER — LORAZEPAM 1 MG PO TABS
1.0000 mg | ORAL_TABLET | Freq: Four times a day (QID) | ORAL | Status: AC | PRN
  Administered 2013-04-27 – 2013-04-28 (×4): 1 mg via ORAL
  Filled 2013-04-26 (×6): qty 1

## 2013-04-26 MED ORDER — LORAZEPAM 1 MG PO TABS
1.0000 mg | ORAL_TABLET | Freq: Four times a day (QID) | ORAL | Status: AC
  Administered 2013-04-26 (×3): 1 mg via ORAL
  Filled 2013-04-26 (×2): qty 1

## 2013-04-26 MED ORDER — GABAPENTIN 100 MG PO CAPS
100.0000 mg | ORAL_CAPSULE | Freq: Three times a day (TID) | ORAL | Status: DC
  Administered 2013-04-26 – 2013-05-05 (×28): 100 mg via ORAL
  Filled 2013-04-26 (×35): qty 1

## 2013-04-26 NOTE — [facility] Suicide Risk Assessment (Signed)
Suicide Risk Assessment  Admission Assessment     Nursing information obtained from:  Patient Demographic factors:  Male;Low socioeconomic status;Unemployed Current Mental Status:  NA Loss Factors:  Financial problems / change in socioeconomic status;Legal issues Historical Factors:  Prior suicide attempts;Family history of mental illness or substance abuse Risk Reduction Factors:  Sense of responsibility to family;Living with another person, especially a relative;Positive social support  CLINICAL FACTORS:   Depression:   Comorbid alcohol abuse/dependence Hopelessness Insomnia Alcohol/Substance Abuse/Dependencies Medical Diagnoses and Treatments/Surgeries  COGNITIVE FEATURES THAT CONTRIBUTE TO RISK:  Closed-mindedness Polarized thinking Thought constriction (tunnel vision)    SUICIDE RISK:   Moderate:  Frequent suicidal ideation with limited intensity, and duration, some specificity in terms of plans, no associated intent, good self-control, limited dysphoria/symptomatology, some risk factors present, and identifiable protective factors, including available and accessible social support.  PLAN OF CARE: Supportive approach/coping skills/relapse prevention                               [facility] detox protocol                               Reassess and address the co morbidities                               Optimize treatment with psychotropics  I certify that inpatient services furnished can reasonably be expected to improve the patient's condition.  Mieke Brinley A 04/26/2013, 4:04 PM

## 2013-04-26 NOTE — Tx Team (Signed)
Initial Interdisciplinary Treatment Plan  PATIENT STRENGTHS: (choose at least two) Ability for insight Average or above average intelligence Capable of independent living General fund of knowledge Motivation for treatment/growth Physical Health Supportive family/friends  PATIENT STRESSORS: Legal issue Substance abuse   PROBLEM LIST: Problem List/Patient Goals Date to be addressed Date deferred Reason deferred Estimated date of resolution  Substance Abuse 04/26/2013   05/03/2013  Depression 04/26/2013   05/03/2013  Legal Trouble 04/26/2013   05/03/2013                                       DISCHARGE CRITERIA:  Improved stabilization in mood, thinking, and/or behavior Motivation to continue treatment in a less acute level of care Need for constant or close observation no longer present Reduction of life-threatening or endangering symptoms to within safe limits Verbal commitment to aftercare and medication compliance Withdrawal symptoms are absent or subacute and managed without 24-hour nursing intervention  PRELIMINARY DISCHARGE PLAN: 12 step recovery Return to previous living arraingment  PATIENT/FAMIILY INVOLVEMENT: This treatment plan has been presented to and reviewed with the patient, [name], and/or family member.  The patient and family have been given the opportunity to ask questions and make suggestions.  [name] 04/26/2013, 12:58 PM

## 2013-04-26 NOTE — [facility] Group Notes (Signed)
[facility] LCSW Group Therapy  04/26/2013 1:15 PM   Type of Therapy:  Group Therapy  Participation Level:  Did Not Attend - pt was meeting with providers for admission Johnathan Ivan, LCSW 04/26/2013 2:42 PM
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
## 2013-04-26 NOTE — Progress Notes (Signed)
Patient ID: [name], male   DOB: Sep 23, 1955, 57 y.o.   MRN: [id number]  D: Patient has flat on approach today. Reports come in with depression today and alcohol detox. He reports not feeling well generally today due to withdrawals but no stomach issues. Just c/o some tremors. On ativan protocol at present. No major distress noted. Asking about long term treatment from here. A: Staff will monitor on q 15 minute checks, follow treatment plan, and give meds as ordered. R: Cooperative on the unit.

## 2013-04-26 NOTE — [facility] Group Notes (Signed)
Adult Psychoeducational Group Note  Date:  04/26/2013 Time:  10:12 PM  Group Topic/Focus:  AA Meeting  Participation Level:  Did Not Attend  Participation Quality:  None  Affect:  None  Cognitive:  None  Insight: None  Engagement in Group:  None  Modes of Intervention:  Discussion and Education  Additional Comments:  Kinsey did not attend group.  Caroll Rancher A 04/26/2013, 10:12 PM

## 2013-04-26 NOTE — Progress Notes (Addendum)
Patient ID: [name], male   DOB: Oct 14, 1955, 57 y.o.   MRN: [id number] This a 57 year old male admitted for detox from ETOH and crack cocaine. Pt states that he has been drinking more and more recently partly due to an upcoming court appearance for "simple assault." Pt's lawyer encouraged inpatient admission for treatment in order to improve his legal standing, although pt states that he does want to stay sober. He longest period of sobriety was 17 months while in a long term tx facility. Pt mood is depressed and his affect is sad/blank. Pt claims he was drinking a 12 pack a day with liquor, but is not experiencing withdrawal symptoms at this time. Writer oriented pt to the milieu and food was provided. 15 minute checks initiated for safety.

## 2013-04-26 NOTE — ED Notes (Signed)
Called report, pt is scheduled for transportation @1130am  via [place]. Will continue to monitor for safelty

## 2013-04-27 MED ORDER — BENAZEPRIL HCL 5 MG PO TABS
5.0000 mg | ORAL_TABLET | Freq: Every day | ORAL | Status: DC
  Administered 2013-04-28 – 2013-05-05 (×7): 5 mg via ORAL
  Filled 2013-04-27 (×11): qty 1

## 2013-04-27 MED ORDER — TRAZODONE HCL 50 MG PO TABS
50.0000 mg | ORAL_TABLET | Freq: Every day | ORAL | Status: DC
  Administered 2013-04-27 – 2013-05-05 (×9): 50 mg via ORAL
  Filled 2013-04-27 (×11): qty 1

## 2013-04-27 NOTE — [facility] Group Notes (Signed)
[facility] Group Notes:  (Nursing/MHT/Case Management/Adjunct)  Date:  04/27/2013  Time:  3:45 PM  Type of Therapy:  Psychoeducational Skills - Healthy  Choices/Healthy Coping   Participation Level:  None  Participation Quality:  Drowsy and Inattentive  Affect:  Depressed  Cognitive:  Lacking  Insight:  Lacking  Engagement in Group:  None  Modes of Intervention:  Discussion and Education  Summary of Progress/Problems: Healthy choices/Coping psychoeducational group. Pt was drowsy and inattentive. Malva Limes 04/27/2013, 3:45 PM
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
## 2013-04-27 NOTE — [facility] Group Notes (Signed)
[facility] Group Notes:  (Clinical Social Work)  04/27/2013     10-11AM  Summary of Progress/Problems:   The main focus of today's process group was for the patient to identify ways in which they have in the past sabotaged their own recovery. Motivational Interviewing was utilized to ask the group members what they get out of their substance use, and what reasons they may have for wanting to change.  The Stages of Change were explained using a handout, and patients identified where they currently are with regard to stages of change.  The patient expressed that he is here to detox from alcohol and get off cocaine.  He would like to go to long-term treatment, specifically asking for placement into [facility] in [place], where he spent 7 months in 2009.  He had been doing well while living at [facility], but recently moved to his own independent housing, and could not handle the change in level of routine.  Felt he became complacent after 17 months of sobriety, and thus started drinking again.  Type of Therapy:  Group Therapy - Process   Participation Level:  Active  Participation Quality:  Attentive  Affect:  Blunted and Depressed  Cognitive:  Appropriate and Oriented  Insight:  Developing/Improving  Engagement in Therapy:  Engaged  Modes of Intervention:  Education, Support and Processing, Motivational Interviewing  Ambrose Mantle, LCSW 04/27/2013, 12:26 PM

## 2013-04-27 NOTE — Progress Notes (Signed)
[facility] Group Notes:  (Nursing/MHT/Case Management/Adjunct)  Date:  04/27/2013  Time:  6:39 PM  Type of Therapy:  Psychoeducational Skills  Participation Level:  Minimal  Participation Quality:  Appropriate  Affect:  Flat  Cognitive:  Appropriate  Insight:  Appropriate  Engagement in Group:  Engaged  Modes of Intervention:  Activity  Summary of Progress/Problems: Pts played a game of pictionary using coping skills. Pt stated one coping skill he has learned is to listen to others and hear what they are saying.  [name] 04/27/2013, 6:39 PM

## 2013-04-27 NOTE — Progress Notes (Signed)
Patient ID: [name], male   DOB: April 02, 1956, 57 y.o.   MRN: [id number] D. Patient presents with depressed mood, affect flat. Patient continues to be isolative, withdrawn and with minimal interaction with peers. He also continues to report various withdrawal symptoms, including agitation, tremors chills and shakiness.  He completed self inventory and rates depression at 8/10 on depression scale, 10 being worst depression, 1 being least. A. Support and encouragement provided. Medications given as ordered. R. Patient denies any SI. No further voiced concerns at this time. Will continue to monitor q 15 minutes for safety.
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
## 2013-04-27 NOTE — Progress Notes (Signed)
Patient alert. Requested candy. Denies detox complaints. Appeared slightly anxious. Patient reports that he wants long term treatments. States that "I'm 57 years old. I can't keep doing this".  Encouragement offered.  Patient safety maintained, Q 15 checks continue.

## 2013-04-28 NOTE — Progress Notes (Signed)
Patient did attend the evening speaker AA meeting.  

## 2013-04-28 NOTE — Progress Notes (Signed)
[facility] Group Notes:  (Nursing/MHT/Case Management/Adjunct)  Date:  04/27/2013  Time:  2100 Type of Therapy:  wrap up group  Participation Level:  Active  Participation Quality:  Appropriate, Attentive and Sharing  Affect:  Appropriate  Cognitive:  Lacking  Insight:  Appropriate  Engagement in Group:  Engaged  Modes of Intervention:  Clarification, Education and Support  Summary of Progress/Problems: Pt wants to go to long term treatment in [place] for a year after discharge.   [name] 04/28/2013, 3:13 AM

## 2013-04-28 NOTE — Progress Notes (Signed)
Patient ID: [name], male   DOB: [birth year], 57 y.o.   MRN: [id number] D. Patient presents with depressed mood, affect flat. Patient reports '' I'm feeling better today, you know today is a good day because I'm alive and I'm here '' He also continues to report various withdrawal symptoms, including agitation, tremors chills and shakiness. He completed self inventory and rates depression at 8/10 on depression scale, 10 being worst depression, 1 being least again today. He also reports poor sleep. A. Support and encouragement provided. Medications given as ordered. R. Patient denies any SI. No further voiced concerns at this time. Will continue to monitor q 15 minutes for safety.

## 2013-04-28 NOTE — [facility] Group Notes (Signed)
[facility] Group Notes:  (Clinical Social Work)  04/28/2013  10:00-11:00AM  Summary of Progress/Problems:   The main focus of today's process group was to identify the patient's current support system and explore what other supports can be put in place.  There was also an extensive discussion about what constitutes a healthy support versus an unhealthy support.  With some group members' miscomprehension about the role of counseling in recovery, a great deal of the time was spent on discussing expectations for self and others in treatment.  The patient stated the current supports in place are his mother and sister (who is a doctor).  He did not speak for the remainder of group, initially following the discussion carefully, then becoming drowsy and eventually fall asleep for the remainder of the group.  Type of Therapy:  Process Group with Motivational Interviewing  Participation Level:  Minimal  Participation Quality:  Attentive, then drowsy  Affect:  Blunted  Cognitive:  Appropriate and Oriented  Insight:  Developing/Improving  Engagement in Therapy:  Developing/Improving  Modes of Intervention:   Education, Support and Processing, Activity  Ambrose Mantle, LCSW 04/28/2013, 12:28 PM

## 2013-04-28 NOTE — Progress Notes (Signed)
D - Pt alert and oriented, flat affect, depressed. Pt attended group tonight listening attentively. Pt interacting with peers appropriately this evening. Pt reports that he feels tired and has no energy. Pt drowsy tonight, encouraged pt to go to bed and try to sleep.  A - Offered encouragement and support through therapeutic conversation. Encouraged pt to speak with staff about any concerns or questions. Medications given as ordered.  R - Q 15 minute checks for safety. Pt remains safe on the unit.

## 2013-04-28 NOTE — Progress Notes (Signed)
NUTRITION ASSESSMENT  Pt identified as at risk on the Malnutrition Screen Tool  INTERVENTION: 1. Educated patient on the importance of nutrition and encouraged intake of food and beverages. Encouraged intake of meals and snacks.  2. Discussed weight goals. 3. Continue medication to help with diarrhea.   NUTRITION DIAGNOSIS: Inadequate oral intake related to diarrhea as evidenced by pt report.   Goal: Pt to meet >/= 90% of their estimated nutrition needs.  Monitor:  PO intake  Assessment:  Patient with history of HIV, admitted for substance abuse. He reports that his intake has been poor for a while due to substance use. He is currently eating 10% of his meals, which he attributes to diarrhea. Imodium ordered PRN for this. He states is current weight is 225 pounds, which is down 5 pounds (98%) from March 2014. He declines supplements at this time.   57 y.o. male  Height: Ht Readings from Last 1 Encounters:  04/26/13 6' (1.829 m)    Weight: Wt Readings from Last 1 Encounters:  08/02/12 230 lb (104.327 kg)    Weight Hx: Wt Readings from Last 10 Encounters:  08/02/12 230 lb (104.327 kg)  03/14/12 228 lb (103.42 kg)  01/30/12 230 lb (104.327 kg)  11/23/11 224 lb (101.606 kg)  09/21/11 211 lb (95.709 kg)  05/05/09 206 lb (93.441 kg)  07/08/08 210 lb (95.255 kg)  07/18/07 207 lb 3.2 oz (93.985 kg)  12/13/06 217 lb 9.6 oz (98.703 kg)  10/25/06 212 lb 1.6 oz (96.208 kg)    BMI:  There is no weight on file to calculate BMI.  Estimated Nutritional Needs: Kcal: 25-30 kcal/kg Protein: > 1 gram protein/kg Fluid: 1 ml/kcal  Diet Order:  Regular Pt is also offered choice of unit snacks mid-morning and mid-afternoon.  Pt is eating as desired.   Lab results and medications reviewed.   Linnell Fulling, RD, LDN Pager #: [phone] After-Hours Pager #: [phone]

## 2013-04-28 NOTE — [facility] Group Notes (Signed)
[facility] Group Notes:  (Nursing/MHT/Case Management/Adjunct)  Date:  04/28/2013  Time:  10:33 AM  Type of Therapy:  Psychoeducational Skills  Participation Level:  Minimal  Participation Quality:  Drowsy  Affect:  Depressed  Cognitive:  Lacking  Insight:  Lacking  Engagement in Group:  Lacking and Limited  Modes of Intervention:  Discussion, Education and Exploration  Summary of Progress/Problems:  [name] 04/28/2013, 10:33 AM

## 2013-04-28 NOTE — Progress Notes (Signed)
D - Pt woke up with some confusion, walked to the nurses station and asked "are we suppose to be outside cleaning?" A - Re-oriented pt without difficulty. Encouraged pt to speak with staff about any concerns or questions.  R - Pt returned to bed, resting quietly with eyes closed, respirations even and unlabored. Q 15 minute checks for safety. Pt remains safe on the unit.

## 2013-04-28 NOTE — Progress Notes (Signed)
[facility] Group Notes:  (Nursing/MHT/Case Management/Adjunct)  Date:  04/28/2013  Time:  6:41 PM  Type of Therapy:  Psychoeducational Skills  Participation Level:  Did Not Attend  Summary of Progress/Problems: Pt was in bed asleep.  Caswell Corwin 04/28/2013, 6:41 PM
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
## 2013-04-29 DIAGNOSIS — F259 Schizoaffective disorder, unspecified: Secondary | ICD-10-CM

## 2013-04-29 MED ORDER — ARIPIPRAZOLE 5 MG PO TABS
5.0000 mg | ORAL_TABLET | Freq: Every day | ORAL | Status: DC
  Administered 2013-04-29 – 2013-05-05 (×7): 5 mg via ORAL
  Filled 2013-04-29 (×5): qty 1
  Filled 2013-04-29: qty 14
  Filled 2013-04-29 (×5): qty 1

## 2013-04-29 NOTE — [facility] Group Notes (Signed)
[facility] LCSW Aftercare Discharge Planning Group Note   04/29/2013 9:44 AM  Participation Quality:  Appropriate   Mood/Affect:  Depressed and Flat  Depression Rating:  4  Anxiety Rating:  4  Thoughts of Suicide:  No Will you contract for safety?   NA  Current AVH:  No  Plan for Discharge/Comments:  Pt reports that he is interested in getting into long term facility-[facility]. Pt reports that he does not currently have any mental health providers and lives with his sister and friend. Pt reports that he went to [facility] in 2009 and stayed sober for "awhile" afterward.   Transportation Means: none identified.   Supports: sister/friends   Smart, Oncologist

## 2013-04-29 NOTE — Progress Notes (Signed)
The focus of this group is to help patients review their daily goal of treatment and discuss progress on daily workbooks. Pt attended the evening group session and responded to all discussion prompts from the Writer. Pt shared that today was a good day because he was able to talk to his sister and because he felt like he knew what he wanted his aftercare plans to be. Pt shared that he felt he needed a longer recovery program than fourteen or thirty days. Pt reported having no additional needs from Nursing Staff. Pt's affect was appropriate.

## 2013-04-29 NOTE — Progress Notes (Signed)
Adult Psychoeducational Group Note  Date:  04/29/2013 Time:  11:00AM Group Topic/Focus:  Wellness Toolbox:   The focus of this group is to discuss various aspects of wellness, balancing those aspects and exploring ways to increase the ability to experience wellness.  Patients will create a wellness toolbox for use upon discharge.  Participation Level:  Did Not Attend   Additional Comments:  Pt. Didn't attend group.   Bing Plume D 04/29/2013, 3:22 PM

## 2013-04-29 NOTE — [facility] Group Notes (Signed)
[facility] LCSW Group Therapy  04/29/2013 3:28 PM  Type of Therapy:  Group Therapy  Participation Level:  Active  Participation Quality:  Attentive  Affect:  Appropriate  Cognitive:  Alert and Oriented  Insight:  Engaged  Engagement in Therapy:  Engaged  Modes of Intervention:  Confrontation, Discussion, Education, Exploration, Problem-solving, Socialization and Support  Summary of Progress/Problems: Today's Topic: Overcoming Obstacles. Pt identified obstacles faced currently and processed barriers involved in overcoming these obstacles. Pt identified steps necessary for overcoming these obstacles and explored motivation (internal and external) for facing these difficulties head on. Pt further identified one area of concern in their lives and chose a skill of focus pulled from their "toolbox." Irven stated that his biggest obstacle is "homelessness.' Ashad stated that he plans to go into treatment in order to learn coping skills and maintain sobriety for a longer period. 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 He plans to go to [facility] in [place] or [facility]. Quenten was engaged and attentive throughout today's groups and shows improving insight AEB his ability to identify obstacles and explore ways to overcome these obstacles including goals for the future.    Smart, Ronee Ranganathan LCSWA  04/29/2013, 3:28 PM

## 2013-04-29 NOTE — Progress Notes (Signed)
Patient ID: [name], male   DOB: 01-18-1956, 57 y.o.   MRN: [id number] PER STATE REGULATIONS 482.30  THIS CHART WAS REVIEWED FOR MEDICAL NECESSITY WITH RESPECT TO THE PATIENT'S ADMISSION/ DURATION OF STAY.  NEXT REVIEW DATE:  04/29/2013 Willa Rough, RN, BSN CASE MANAGER

## 2013-04-29 NOTE — Progress Notes (Signed)
Patient ID: [name], male   DOB: [birth year], 57 y.o.   MRN: [id number]  D: Patient continues to have a flat affect on approach today but reports mood improved since admission. Places depression and hopelessness feelings at a "4". Currently denies any SI at this time. Reports wants to go to long term treatment. A: Staff will monitor on q 15 minute checks, follow treatment plan, and give meds as ordered. R: Cooperative on the unit. Taking meds without issue.

## 2013-04-29 NOTE — Progress Notes (Signed)
[name] MD Progress Note  04/29/2013 7:45 PM [name]  MRN:  [id number] Subjective:  [name] endorses that he got together with the wrong people. States that he is committed to abstaining from using. He is concerned as when he starts drinking and doing drugs he does not comply with his HIV treatment. Wants to go to a residential treatment center, longer than 90 days. He did well after he went to [facility] and will like to have a chance to go again. States that he was switched to Risperdal from Abilify. States he did better on Abilify and that on Risperdal he had a lot of side effects Diagnosis:   DSM5: Schizophrenia Disorders:  Schizoaffective Disorder Obsessive-Compulsive Disorders:  none Trauma-Stressor Disorders:  none Substance/Addictive Disorders:  Alcohol Related Disorder - Moderate (303.90), Cocaine Related Disorders Depressive Disorders:  Major Depressive Disorder - Moderate (296.22)  Axis I: Anxiety Disorder NOS, Mood Disorder NOS and Substance Induced Mood Disorder  ADL's:  Intact  Sleep: Fair  Appetite:  Fair  Suicidal Ideation:  Plan:  denies Intent:  denies Means:  denies Homicidal Ideation:  Plan:  denies Intent:  denies Means:  denies AEB (as evidenced by):  Psychiatric Specialty Exam: Review of Systems  HENT: Negative.   Eyes: Negative.   Respiratory: Negative.   Cardiovascular: Negative.   Gastrointestinal: Negative.   Genitourinary: Negative.   Musculoskeletal: Negative.   Skin: Negative.   Neurological: Positive for weakness.  Endo/Heme/Allergies: Negative.   Psychiatric/Behavioral: Positive for depression and substance abuse. The patient is nervous/anxious and has insomnia.     Blood pressure 114/85, pulse 98, temperature 97.3 F (36.3 C), temperature source Oral, resp. rate 20, height 6' (1.829 m).There is no weight on file to calculate BMI.  General Appearance: Fairly Groomed  Patent attorney::  Fair  Speech:  Clear and Coherent  Volume:   fluctuates  Mood:  Anxious, Depressed and worried  Affect:  anxious, worried  Thought Process:  Coherent and Goal Directed  Orientation:  Full (Time, Place, and Person)  Thought Content:  worries, concerns, symptoms  Suicidal Thoughts:  No  Homicidal Thoughts:  No  Memory:  Immediate;   Fair Recent;   Fair Remote;   Fair  Judgement:  Fair  Insight:  Shallow  Psychomotor Activity:  Restlessness  Concentration:  Fair  Recall:  Fair  Akathisia:  NA  Handed:    AIMS (if indicated):     Assets:  Desire for Improvement  Sleep:  Number of Hours: 4.5   Current Medications: Current Facility-Administered Medications  Medication Dose Route Frequency Provider Last Rate Last Dose  . abacavir (ZIAGEN) tablet 600 mg  600 mg Oral Daily Rachael Fee, MD   600 mg at 04/29/13 0820   And  . lamiVUDine (EPIVIR) tablet 300 mg  300 mg Oral Daily Rachael Fee, MD   300 mg at 04/29/13 0820  . ARIPiprazole (ABILIFY) tablet 5 mg  5 mg Oral Daily Rachael Fee, MD   5 mg at 04/29/13 1437  . atovaquone (MEPRON) 750 MG/5ML suspension 1,500 mg  1,500 mg Oral Daily Rachael Fee, MD   1,500 mg at 04/29/13 0819  . benazepril (LOTENSIN) tablet 5 mg  5 mg Oral Daily Sanjuana Kava, NP   5 mg at 04/29/13 0820  . citalopram (CELEXA) tablet 20 mg  20 mg Oral Daily Nanine Means, NP   20 mg at 04/29/13 0820  . Darunavir Ethanolate (PREZISTA) tablet 800 mg  800 mg Oral QAC  breakfast Rachael Fee, MD   800 mg at 04/29/13 [phone]  . gabapentin (NEURONTIN) capsule 100 mg  100 mg Oral TID Nanine Means, NP   100 mg at 04/29/13 1642  . multivitamin with minerals tablet 1 tablet  1 tablet Oral Daily Rachael Fee, MD   1 tablet at 04/29/13 [phone]  . omega-3 acid ethyl esters (LOVAZA) capsule 1 g  1 g Oral BID Nanine Means, NP   1 g at 04/29/13 1642  . pantoprazole (PROTONIX) EC tablet 40 mg  40 mg Oral BID Nanine Means, NP   40 mg at 04/29/13 1642  . ritonavir (NORVIR) tablet 100 mg  100 mg Oral QAC breakfast Rachael Fee, MD    100 mg at 04/29/13 0820  . thiamine (B-1) injection 100 mg  100 mg Intramuscular Once Rachael Fee, MD      . thiamine (VITAMIN B-1) tablet 100 mg  100 mg Oral Daily Rachael Fee, MD   100 mg at 04/29/13 5409  . traZODone (DESYREL) tablet 50 mg  50 mg Oral QHS Sanjuana Kava, NP   50 mg at 04/28/13 2127    Lab Results: No results found for this or any previous visit (from the past 48 hour(s)).  Physical Findings: AIMS: Facial and Oral Movements Muscles of Facial Expression: None, normal Lips and Perioral Area: None, normal Jaw: None, normal Tongue: None, normal,Extremity Movements Upper (arms, wrists, hands, fingers): None, normal Lower (legs, knees, ankles, toes): None, normal, Trunk Movements Neck, shoulders, hips: None, normal, Overall Severity Severity of abnormal movements (highest score from questions above): None, normal Incapacitation due to abnormal movements: None, normal, Dental Status Current problems with teeth and/or dentures?: No Does patient usually wear dentures?: Yes  CIWA:  CIWA-Ar Total: 5 COWS:     Treatment Plan Summary: Daily contact with patient to assess and evaluate symptoms and progress in treatment Medication management  Plan: Supportive approach/coping skills/relapse prevention          D/C the Risperdal          Resume the Abilify          Explore rehab options  Medical Decision Making Problem Points:  Review of psycho-social stressors (1) Data Points:  Review of medication regiment & side effects (2) Review of new medications or change in dosage (2)  I certify that inpatient services furnished can reasonably be expected to improve the patient's condition.   Feleica Fulmore A 04/29/2013, 7:45 PM

## 2013-04-29 NOTE — Tx Team (Signed)
Interdisciplinary Treatment Plan Update (Adult)  Date: 04/29/2013   Time Reviewed: 11:30 AM  Progress in Treatment:  Attending groups: Yes  Participating in groups:  Yes  Taking medication as prescribed: Yes  Tolerating medication: Yes  Family/Significant othe contact made: No. SPE not required for this pt.  Patient understands diagnosis: Yes, AEB seeking treatment for ETOH detox, crack cocaine abuse, and mood stabilization.  Discussing patient identified problems/goals with staff: Yes  Medical problems stabilized or resolved: Yes  Denies suicidal/homicidal ideation: Yes during admission, group, self report.  Patient has not harmed self or Others: Yes  New problem(s) identified:  Discharge Plan or Barriers: Pt hoping for admission into [facility] or [facility] and plans to follow up at [facility] for med management. Pt also currently homeless.  Additional comments: 57 yo male with HIV, substance abuse presents with wanting rehab for etoh and crack. Pt drinks and uses substances daily. I was asked to see patient in triage as he changes his mind and wanted to leave. Patient walked in on his own, his family member told him he needed to get help for crack and detox so that is why he came in. Pt denies SI and HI. He does not want to quit using substances at this time. Reason for Continuation of Hospitalization: Mood stabilization Medication management  Estimated length of stay: 2-3 days  For review of initial/current patient goals, please see plan of care.  Attendees:  Patient:    Family:    Physician: Geoffery Lyons MD 04/29/2013 11:29 AM   Nursing:  .  Clinical Social Worker [employer], LCSWA  04/29/2013 11:29 AM   Other: Darden Dates Nurse CM  04/29/2013 11:29 AM   Other:    Other:     Other:    Scribe for Treatment Team:  [employer] LCSWA 04/29/2013 11:30 AM

## 2013-04-29 NOTE — [facility] Suicide Risk Assessment (Signed)
[facility] INPATIENT: Family/Significant Other Suicide Prevention Education  Suicide Prevention Education:  Education Completed; No one has been identified by the patient as the family member/significant other with whom the patient will be residing, and identified as the person(s) who will aid the patient in the event of a mental health crisis (suicidal ideations/suicide attempt).   Pt did not c/o SI at admission, nor have they endorsed SI during their stay here. SPE not required. SPI pamphlet provided to pt and he was encouraged to share this information with his support network, ask questions, and talk about any concerns.   [address] LCSWA 04/29/2013 12:33 PM

## 2013-04-29 NOTE — Progress Notes (Signed)
Adult Psychoeducational Group Note  Date:  04/29/2013 Time: 10:00AM Group Topic/Focus:  therapuetic activity  Participation Level:  Active  Participation Quality:  Appropriate and Attentive  Affect:  Appropriate  Cognitive:  Alert and Appropriate  Insight: Appropriate  Engagement in Group:  movie  Modes of Intervention:  movie  Additional Comments:  Pt was able to watch movie The sober life: My behavior and complete a word search on Mental/Emotional words.   Bing Plume D 04/29/2013, 10:18 AM

## 2013-04-30 NOTE — Clinical Social Work Note (Signed)
Pt requested that letter be drawn up for his lawyer with: [facility] date of admission and unknown discharge date, reason for admission, and current d/c plan. Pt's sister, [name] will pick up letter today at reception.   [employer], LCSWA  04/30/2013 11:30 AM

## 2013-04-30 NOTE — Progress Notes (Signed)
D:  Per pt self inventory pt reports sleeping fair, appetite improving, energy level low, ability to pay attention improving, rates depression at a 6 out of 10 and hopelessness at a 6 out of 10, denies SI/HI/AVH, calm and cooperative, denies pain.  A:  Emotional support provided, Encouraged pt to continue with treatment plan and attend all group activities, q15 min checks maintained for safety.  R:  Pt is receptive, has good insight going to groups, pleasant with staff and other patients.

## 2013-04-30 NOTE — Progress Notes (Signed)
Recreation Therapy Notes  Animal-Assisted Activity/Therapy (AAA/T) Program Checklist/Progress Notes Patient Eligibility Criteria Checklist & Daily Group note for Rec Tx Intervention  Date: 12.09.2014  Time: 2:30pm Location: [address] Dayroom    AAA/T Program Assumption of Risk Form signed by Patient/ or Parent Legal Guardian yes  Patient is free of allergies or sever asthma  yes  Patient reports no fear of animals yes  Patient reports no history of cruelty to animals yes  Patient understands his/her participation is voluntary yes  Patient washes hands before animal contact yes  Patient washes hands after animal contact yes  Behavioral Response: Did not attend.   [name] L Burris Matherne, LRT/CTRS  Natalee Tomkiewicz L 04/30/2013 4:21 PM 

## 2013-04-30 NOTE — [facility] Group Notes (Signed)
[facility] LCSW Group Therapy  04/30/2013 1:38 PM  Type of Therapy:  Group Therapy  Participation Level:  Active  Participation Quality:  Attentive  Affect:  Appropriate  Cognitive:  Alert and Oriented  Insight:  Engaged  Engagement in Therapy:  Engaged  Modes of Intervention:  Discussion, Education, Exploration, Problem-solving, Socialization and Support  Summary of Progress/Problems: MHA Speaker came to talk about his personal journey with substance abuse and addiction. The pt processed ways by which to relate to the speaker. MHA speaker provided handouts and educational information pertaining to groups and services offered by the [facility]. [name] was attentive and engaged throughout today's therapy group. Pt actively listened as speaker discussed his personal story and reviewed various groups/services offered by the Mental health association. [name] thanked the speaker at the end of group and stated that he has been to the [facility] for groups in the past.   Smart, Lebron Quam 04/30/2013, 1:38 PM

## 2013-04-30 NOTE — Progress Notes (Signed)
Patient did not attend 10 am Group

## 2013-04-30 NOTE — Progress Notes (Signed)
Patient ID: [name], male   DOB: Feb 25, 1956, 57 y.o.   MRN: [id number] [facility] MD Progress Note  04/30/2013 3:10 PM [name]  MRN:  [id number]  Subjective:  Burr endorses that he is feeling tired today. He says he is moving very slowly because he did not sleeping well last night, a lot of tossing and turning. Hoping to get into [facility] in [place] to continue treatment. Appetite is fair. Denies SIHI.   Diagnosis:   DSM5: Schizophrenia Disorders:  Schizoaffective Disorder Obsessive-Compulsive Disorders:  none Trauma-Stressor Disorders:  none Substance/Addictive Disorders:  Alcohol Related Disorder - Moderate (303.90), Cocaine Related Disorders Depressive Disorders:  Major Depressive Disorder - Moderate (296.22)  Axis I: Anxiety Disorder NOS, Mood Disorder NOS and Substance Induced Mood Disorder  ADL's:  Intact  Sleep: Fair  Appetite:  Fair  Suicidal Ideation:  Plan:  denies Intent:  denies Means:  denies Homicidal Ideation:  Plan:  denies Intent:  denies Means:  denies AEB (as evidenced by):  Psychiatric Specialty Exam: Review of Systems  HENT: Negative.   Eyes: Negative.   Respiratory: Negative.   Cardiovascular: Negative.   Gastrointestinal: Negative.   Genitourinary: Negative.   Musculoskeletal: Negative.   Skin: Negative.   Neurological: Positive for weakness.  Endo/Heme/Allergies: Negative.   Psychiatric/Behavioral: Positive for depression and substance abuse. The patient is nervous/anxious and has insomnia.     Blood pressure 108/69, pulse 83, temperature 98.5 F (36.9 C), temperature source Oral, resp. rate 21, height 6' (1.829 m).There is no weight on file to calculate BMI.  General Appearance: Fairly Groomed  Patent attorney::  Fair  Speech:  Clear and Coherent  Volume:  fluctuates  Mood:  Anxious, Depressed and worried  Affect:  anxious, worried  Thought Process:  Coherent and Goal Directed  Orientation:  Full  (Time, Place, and Person)  Thought Content:  worries, concerns, symptoms  Suicidal Thoughts:  No  Homicidal Thoughts:  No  Memory:  Immediate;   Fair Recent;   Fair Remote;   Fair  Judgement:  Fair  Insight:  Shallow  Psychomotor Activity:  Restlessness  Concentration:  Fair  Recall:  Fair  Akathisia:  NA  Handed:    AIMS (if indicated):     Assets:  Desire for Improvement  Sleep:  Number of Hours: 6.5   Current Medications: Current Facility-Administered Medications  Medication Dose Route Frequency Provider Last Rate Last Dose  . abacavir (ZIAGEN) tablet 600 mg  600 mg Oral Daily Rachael Fee, MD   600 mg at 04/30/13 7829   And  . lamiVUDine (EPIVIR) tablet 300 mg  300 mg Oral Daily Rachael Fee, MD   300 mg at 04/30/13 0752  . ARIPiprazole (ABILIFY) tablet 5 mg  5 mg Oral Daily Rachael Fee, MD   5 mg at 04/30/13 5621  . atovaquone (MEPRON) 750 MG/5ML suspension 1,500 mg  1,500 mg Oral Daily Rachael Fee, MD   1,500 mg at 04/30/13 0756  . benazepril (LOTENSIN) tablet 5 mg  5 mg Oral Daily Sanjuana Kava, NP   5 mg at 04/30/13 0752  . citalopram (CELEXA) tablet 20 mg  20 mg Oral Daily Nanine Means, NP   20 mg at 04/30/13 0752  . Darunavir Ethanolate (PREZISTA) tablet 800 mg  800 mg Oral QAC breakfast Rachael Fee, MD   800 mg at 04/30/13 3086  . gabapentin (NEURONTIN) capsule 100 mg  100 mg Oral TID Nanine Means, NP  100 mg at 04/30/13 1138  . multivitamin with minerals tablet 1 tablet  1 tablet Oral Daily Rachael Fee, MD   1 tablet at 04/30/13 [phone]  . omega-3 acid ethyl esters (LOVAZA) capsule 1 g  1 g Oral BID Nanine Means, NP   1 g at 04/30/13 0752  . pantoprazole (PROTONIX) EC tablet 40 mg  40 mg Oral BID Nanine Means, NP   40 mg at 04/30/13 0753  . ritonavir (NORVIR) tablet 100 mg  100 mg Oral QAC breakfast Rachael Fee, MD   100 mg at 04/30/13 9604  . thiamine (B-1) injection 100 mg  100 mg Intramuscular Once Rachael Fee, MD      . thiamine (VITAMIN B-1) tablet 100  mg  100 mg Oral Daily Rachael Fee, MD   100 mg at 04/30/13 0752  . traZODone (DESYREL) tablet 50 mg  50 mg Oral QHS Sanjuana Kava, NP   50 mg at 04/29/13 2119    Lab Results: No results found for this or any previous visit (from the past 48 hour(s)).  Physical Findings: AIMS: Facial and Oral Movements Muscles of Facial Expression: None, normal Lips and Perioral Area: None, normal Jaw: None, normal Tongue: None, normal,Extremity Movements Upper (arms, wrists, hands, fingers): None, normal Lower (legs, knees, ankles, toes): None, normal, Trunk Movements Neck, shoulders, hips: None, normal, Overall Severity Severity of abnormal movements (highest score from questions above): None, normal Incapacitation due to abnormal movements: None, normal, Dental Status Current problems with teeth and/or dentures?: No Does patient usually wear dentures?: Yes  CIWA:  CIWA-Ar Total: 5 COWS:     Treatment Plan Summary: Daily contact with patient to assess and evaluate symptoms and progress in treatment Medication management  Plan: Supportive approach/coping skills/relapse prevention Continue Abilify 5 mg daily Explore rehab options (patient interested in going to [facility] in [place], [place])  Medical Decision Making Problem Points:  Review of psycho-social stressors (1) Data Points:  Review of medication regiment & side effects (2) Review of new medications or change in dosage (2)  I certify that inpatient services furnished can reasonably be expected to improve the patient's condition.   Armandina Stammer I, PMHNP, FNP-BC 04/30/2013, 3:10 PM

## 2013-04-30 NOTE — Progress Notes (Signed)
Pt reports he is doing ok today and is not having any withdrawal symptoms.  He denies SI/HI/AV.  He reports he has gone to some groups today.  He said he has felt tired today.  Pt still thinks he is going to [facility] in [place], but note in chart states that pt was not accepted there, and will be going to [facility] on Monday 12/15.  Pt is pleasant/cooperative.  He makes his needs known to staff.  Support and encouragement offered.  Safety maintained with q15 minute checks.

## 2013-04-30 NOTE — Progress Notes (Signed)
Adult Psychoeducational Group Note  Date:  04/30/2013 Time:  11:00AM Group Topic/Focus:  Recovery Goals:   The focus of this group is to identify appropriate goals for recovery and establish a plan to achieve them.  Participation Level:  Active  Participation Quality:  Appropriate and Attentive  Affect:  Appropriate  Cognitive:  Alert and Appropriate  Insight: Appropriate  Engagement in Group:  Engaged  Modes of Intervention:  Discussion  Additional Comments:  Pt. Was attentive and appropriate during today's group discussion. Pt was able to identify what is recovery. Pt shared that he is here to work on having a normal life without having to use drugs or alchol to function.   [name] 04/30/2013, 12:51 PM

## 2013-04-30 NOTE — Progress Notes (Signed)
Recreation Therapy Notes  Date: 12.09.2014 Time: 3:00pm Location: [address] Dayroom   Group Topic: Communication, Team Building, Problem Solving  Goal Area(s) Addresses:  Patient will effectively work with peer towards shared goal.  Patient will identify skill used to make activity successful.  Patient will identify how skills used during activity can be used to reach post d/c goals.   Behavioral Response: Engaged, Appropriate  Intervention: Problem Solving Activitiy  Activity: Life Boat. Patients were given a scenario about being on a sinking yacht. Patients were informed the yacht included 15 guest, 8 of which could be placed on the life boat, along with all group members. Individuals on guest list were of varying socioeconomic classes such as a Education officer, museum, Materials engineer, Midwife, Tree surgeon.   Education: Pharmacist, community, Discharge Planning   Education Outcome: Acknowledges understanding  Clinical Observations/Feedback: Patient actively engaged in group session, voicing his opinion and debating with peers appropriately. Patient made no contributions to group discussion, but appeared to actively listen as he maintained appropriate eye contact with speaker.   Marykay Lex Dallan Schonberg, LRT/CTRS  Clennon Nasca L 04/30/2013 5:01 PM

## 2013-04-30 NOTE — [facility] Group Notes (Signed)
Adult Psychoeducational Group Note  Date:  04/30/2013 Time:  10:10 PM  Group Topic/Focus:  AA Meeting  Participation Level:  Minimal  Participation Quality:  Appropriate  Affect:  Appropriate  Cognitive:  Appropriate  Insight: Appropriate  Engagement in Group:  Engaged  Modes of Intervention:  Discussion and Education  Additional Comments:  Jade attended [employer].  Caroll Rancher A 04/30/2013, 10:10 PM

## 2013-04-30 NOTE — [facility] Group Notes (Signed)
Adult Psychoeducational Group Note  Date:  04/30/2013 Time:  0900am  Group Topic/Focus:  Orientation:   The focus of this group is to educate the patient on the purpose and policies of crisis stabilization and provide a format to answer questions about their admission.  The group details unit policies and expectations of patients while admitted.  Participation Level:  Minimal  Participation Quality:  Appropriate and Attentive  Affect:  Flat  Cognitive:  Alert and Appropriate  Insight: Appropriate and Good  Engagement in Group:  Engaged  Modes of Intervention:  Discussion, Education, Rapport Building and Support  Additional Comments:  Pt shared appropriately and listened attentively to others during the group, pt set a goal for himself to attend all groups and activities today.  Alfonse Spruce 04/30/2013, 9:29 AM

## 2013-04-30 NOTE — Clinical Social Work Note (Signed)
[name] from [facility] called and stated that they could not take pt back due to his HIV diagnosis/instability/need to take HIV meds daily. Pt accepted into [facility] for Monday, 05/06/13.  [employer], LCSWA 04/30/2013 12:53 PM

## 2013-04-30 NOTE — Progress Notes (Signed)
Pt lying in bed in his room awake.  He denies SI/HI/AV at this time.  He reports no withdrawal symptoms at this time.  He wants to go to a long term treatment program like [facility] in [place].  He says he has been there before and it was helpful.  Pt Pt makes his needs known to staff.  Pt voices no needs or concerns at this time.  Support and encouragement offered.  Safety maintained with q15 minute checks.

## 2013-05-01 NOTE — [facility] Group Notes (Signed)
[facility] LCSW Aftercare Discharge Planning Group Note   05/01/2013 10:11 AM  Participation Quality:  Appropriate   Mood/Affect:  Anxious  Depression Rating:  4  Anxiety Rating:  3  Thoughts of Suicide:  No Will you contract for safety?   NA  Current AVH:  No  Plan for Discharge/Comments:  CSW informed pt that he was not accepted into [facility] but that he has screeing for admission at [facility] for Monday 12/1. Pt stated that he plans to stay with his sister until his admission date. Pt made aware that he will need med supply (HIV meds) and mental health meds in order to go to [facility]. Pt reports moderate withdrawals today "mostly strong cravings."   Transportation Means: bus   Supports: sister and friend   Counselling psychologist, Oncologist

## 2013-05-01 NOTE — Progress Notes (Signed)
D:  Per pt self inventory pt reports sleeping fair, appetite improving, energy level low, ability to pay attention improving, rates depression at a 4 out of 10 and hopelessness at a 4 out of 10, denies SI/HI/AVH, pt knows that he was unable to get into [facility] in [place] and is aware that he will be following up at [facility] after discharge.      A:  Emotional support provided, Encouraged pt to continue with treatment plan and attend all group activities, q15 min checks maintained for safety.  R:  Pt is receptive, pleasant and cooperative with staff and other patients, going to some groups.

## 2013-05-01 NOTE — Progress Notes (Signed)
[facility] MD Progress Note  05/01/2013 4:54 PM [name]  MRN:  [id number] Subjective:  [name] continues to detox. He states that he really needs to get to a residential treatment program  as he is afraid of relapsing. States that when he thinks about relapsing he gets overwhelmed, fearful. He states that he feels that if her relapses it will be the end of him. He starts feeling hopeless, helpless Diagnosis:   DSM5: Schizophrenia Disorders:  Denies Obsessive-Compulsive Disorders:  Denies Trauma-Stressor Disorders:  Denies Substance/Addictive Disorders:  Alcohol Related Disorder - Severe (303.90) Depressive Disorders:  Major Depressive Disorder - Moderate (296.22)  Axis I: Schizoaffective Disorder and Substance Induced Mood Disorder  ADL's:  Intact  Sleep: Fair  Appetite:  Fair  Suicidal Ideation:  Plan:  denies Intent:  denies Means:  denies Homicidal Ideation:  Plan:  denies Intent:  denies Means:  denies AEB (as evidenced by):  Psychiatric Specialty Exam: Review of Systems  Constitutional: Negative.   HENT: Negative.   Eyes: Negative.   Respiratory: Negative.   Cardiovascular: Negative.   Gastrointestinal: Negative.   Genitourinary: Negative.   Musculoskeletal: Negative.   Skin: Negative.   Neurological: Negative.   Endo/Heme/Allergies: Negative.   Psychiatric/Behavioral: Positive for depression and substance abuse. The patient is nervous/anxious.     Blood pressure 114/83, pulse 61, temperature 97.9 F (36.6 C), temperature source Oral, resp. rate 16, height 6' (1.829 m).There is no weight on file to calculate BMI.  General Appearance: Fairly Groomed  Patent attorney::  Fair  Speech:  Clear and Coherent  Volume:  Decreased  Mood:  Anxious and Depressed  Affect:  Restricted  Thought Process:  Coherent and Goal Directed  Orientation:  Full (Time, Place, and Person)  Thought Content:  symptoms, worries, concerns  Suicidal Thoughts:  No  Homicidal Thoughts:  No   Memory:  Immediate;   Fair Recent;   Fair Remote;   Fair  Judgement:  Fair  Insight:  Present  Psychomotor Activity:  Restlessness  Concentration:  Fair  Recall:  Fair  Akathisia:  No  Handed:    AIMS (if indicated):     Assets:  Desire for Improvement  Sleep:  Number of Hours: 5.25   Current Medications: Current Facility-Administered Medications  Medication Dose Route Frequency Provider Last Rate Last Dose  . abacavir (ZIAGEN) tablet 600 mg  600 mg Oral Daily Rachael Fee, MD   600 mg at 05/01/13 5621   And  . lamiVUDine (EPIVIR) tablet 300 mg  300 mg Oral Daily Rachael Fee, MD   300 mg at 05/01/13 3086  . ARIPiprazole (ABILIFY) tablet 5 mg  5 mg Oral Daily Rachael Fee, MD   5 mg at 05/01/13 0813  . atovaquone (MEPRON) 750 MG/5ML suspension 1,500 mg  1,500 mg Oral Daily Rachael Fee, MD   1,500 mg at 05/01/13 5784  . benazepril (LOTENSIN) tablet 5 mg  5 mg Oral Daily Sanjuana Kava, NP   5 mg at 05/01/13 6962  . citalopram (CELEXA) tablet 20 mg  20 mg Oral Daily Nanine Means, NP   20 mg at 05/01/13 9528  . Darunavir Ethanolate (PREZISTA) tablet 800 mg  800 mg Oral QAC breakfast Rachael Fee, MD   800 mg at 05/01/13 4132  . gabapentin (NEURONTIN) capsule 100 mg  100 mg Oral TID Nanine Means, NP   100 mg at 05/01/13 1644  . multivitamin with minerals tablet 1 tablet  1 tablet Oral Daily Madie Reno  Jorja Loa, MD   1 tablet at 05/01/13 [phone]  . omega-3 acid ethyl esters (LOVAZA) capsule 1 g  1 g Oral BID Nanine Means, NP   1 g at 05/01/13 1645  . pantoprazole (PROTONIX) EC tablet 40 mg  40 mg Oral BID Nanine Means, NP   40 mg at 05/01/13 1644  . ritonavir (NORVIR) tablet 100 mg  100 mg Oral QAC breakfast Rachael Fee, MD   100 mg at 05/01/13 9604  . thiamine (B-1) injection 100 mg  100 mg Intramuscular Once Rachael Fee, MD      . thiamine (VITAMIN B-1) tablet 100 mg  100 mg Oral Daily Rachael Fee, MD   100 mg at 05/01/13 5409  . traZODone (DESYREL) tablet 50 mg  50 mg Oral QHS Sanjuana Kava, NP   50 mg at 04/30/13 2134    Lab Results: No results found for this or any previous visit (from the past 48 hour(s)).  Physical Findings: AIMS: Facial and Oral Movements Muscles of Facial Expression: None, normal Lips and Perioral Area: None, normal Jaw: None, normal Tongue: None, normal,Extremity Movements Upper (arms, wrists, hands, fingers): None, normal Lower (legs, knees, ankles, toes): None, normal, Trunk Movements Neck, shoulders, hips: None, normal, Overall Severity Severity of abnormal movements (highest score from questions above): None, normal Incapacitation due to abnormal movements: None, normal, Dental Status Current problems with teeth and/or dentures?: No Does patient usually wear dentures?: Yes  CIWA:  CIWA-Ar Total: 5 COWS:     Treatment Plan Summary: Daily contact with patient to assess and evaluate symptoms and progress in treatment Medication management  Plan: Supportive approach/coping skills/relapse prevention           Complete the detox/reassess and address the co morbidities           Facilitate placement in a residential treatment center Medical Decision Making Problem Points:  Review of psycho-social stressors (1) Data Points:  Review of medication regiment & side effects (2)  I certify that inpatient services furnished can reasonably be expected to improve the patient's condition.   Alyannah Sanks A 05/01/2013, 4:54 PM

## 2013-05-01 NOTE — [facility] Group Notes (Signed)
Adult Psychoeducational Group Note  Date:  05/01/2013 Time:  9:44 PM  Group Topic/Focus:  AA Meeting  Participation Level:  Did Not Attend  Participation Quality:  None  Affect:  None  Cognitive:  None  Insight: None  Engagement in Group:  None  Modes of Intervention:  Discussion and Education  Additional Comments:  [name] did not attend group.  Caroll Rancher A 05/01/2013, 9:44 PM

## 2013-05-01 NOTE — Progress Notes (Signed)
Pt did not attend pharmacy group he was in bed.

## 2013-05-01 NOTE — [facility] Group Notes (Signed)
[facility] LCSW Group Therapy  05/01/2013 3:34 PM  Type of Therapy:  Group Therapy  Participation Level:  None  Participation Quality:  Drowsy  Affect:  Depressed and Flat  Cognitive:  Lacking  Insight:  Limited  Engagement in Therapy:  None  Modes of Intervention:  Confrontation, Discussion, Education, Exploration, Limit-setting, Socialization and Support  Summary of Progress/Problems: Emotion Regulation: This group focused on both positive and negative emotion identification and allowed group members to process ways to identify feelings, regulate negative emotions, and find healthy ways to manage internal/external emotions. Group members were asked to reflect on a time when their reaction to an emotion led to a negative outcome and explored how alternative responses using emotion regulation would have benefited them. Group members were also asked to discuss a time when emotion regulation was utilized when a negative emotion was experienced. Galdino was inattentive and disengaged throughout today's therapy group. He fell asleep during group after reporting that he felt tired. At this time, Angelino shows no progress in the group setting due to his extreme drowsiness.    Smart, HeatherLCSWA  05/01/2013, 3:34 PM

## 2013-05-02 MED ORDER — CITALOPRAM HYDROBROMIDE 20 MG PO TABS
30.0000 mg | ORAL_TABLET | Freq: Every day | ORAL | Status: DC
  Administered 2013-05-03: 30 mg via ORAL
  Filled 2013-05-02 (×3): qty 1

## 2013-05-02 NOTE — Progress Notes (Signed)
Recreation Therapy Notes  Animal-Assisted Activity/Therapy (AAA/T) Program Checklist/Progress Notes Patient Eligibility Criteria Checklist & Daily Group note for Rec Tx Intervention  Date: 12.11.2014 Time: 2:30pm Location: [address], applications   AAA/T Program Assumption of Risk Form signed by Patient/ or Parent Legal Guardian yes  Patient is free of allergies or sever asthma yes  Patient reports no fear of animals yes  Patient reports no history of cruelty to animals yes   Patient understands his/her participation is voluntary yes  Patient washes hands before animal contact yes  Patient washes hands after animal contact yes  Behavioral Response: Appropriate, Engaged, Attentive.   Education: Charity fundraiser, Health visitor   Education Outcome: Acknowledges understanding  [name], LRT/CTRS  [name] 05/02/2013 4:38 PM

## 2013-05-02 NOTE — [facility] Group Notes (Signed)
[facility] LCSW Group Therapy  05/02/2013 2:26 PM  Type of Therapy:  Group Therapy  Participation Level:  Minimal  Participation Quality:  Attentive  Affect:  Depressed  Cognitive:  Alert  Insight:  Limited  Engagement in Therapy:  Limited  Modes of Intervention:  Confrontation, Discussion, Education, Exploration, Rapport Building, Socialization and Support  Summary of Progress/Problems:  Finding Balance in Life. Today's group focused on defining balance in one's own words, identifying things that can knock one off balance, and exploring healthy ways to maintain balance in life. Group members were asked to provide an example of a time when they felt off balance, describe how they handled that situation,and process healthier ways to regain balance in the future. Group members were asked to share the most important tool for maintaining balance that they learned while at [facility] and how they plan to apply this method after discharge. [name] was engaged and attentive throughout today's therapy group but did not actively participate in group discussion unless directly asked a question by CSW. [name] shared that to him, balance meant "when something is too much on one side or the other" but had difficulty elaborating on this thought. [name] shows limited progress in the group setting at this time and limited insight AEB his lack of active participation and his difficulty in identifying positive ways to reestablish a sense of balance in life. [name] shared that he needs structure in his life for balance. After some prompting, [name] explored this thought, stating that "I need to learn how to identify my triggers and learn ways to cope with them without turning to drugs or alcohol. I don't know myself very well right now. I need help." CSW and group members supported and encouraged patient during this discussion.    Smart, [name] LCSWA  05/02/2013, 2:26 PM

## 2013-05-02 NOTE — Progress Notes (Signed)
D:  Patient up and active in the milieu.  Attending most groups.  Appetite good.  Denies suicidal thoughts.  States he is going to [facility] on Monday.  States he will be attending a year long program in [place] when he completes his 90 days.     A:  Medications given as prescribed.  Offered support and encouragement.  Provided address and phone number to [facility] to patient per his request.   R:  Interacting well with peers.  Cooperative with staff.  Irritable on the phone at times, but calms down quickly.  Tolerating medications well.  Safety is maintained.

## 2013-05-02 NOTE — Progress Notes (Signed)
Patient ID: [name], male   DOB: Oct 17, 1955, 57 y.o.   MRN: [id number] Patient asleep; no s/s of distress noted at this time. Respirations regular and unlabored.

## 2013-05-02 NOTE — Progress Notes (Signed)
[facility] Group Notes:  (Nursing/MHT/Case Management/Adjunct)  Date:  05/02/2013  Time:  2100   Type of Therapy:  wrap up group  Participation Level:  Active  Participation Quality:  Appropriate  Affect:  Appropriate  Cognitive:  Lacking  Insight:  Improving  Engagement in Group:  attentive  Modes of Intervention:  Clarification, Education and Support  Summary of Progress/Problems:  Pt reports being declined to the program he wanted to attend in [place] but is going to [facility] on Monday. Pt wants to stay 90 days and then [facility] [place] for one year.   Shelah Lewandowsky 05/02/2013, 11:12 PM

## 2013-05-02 NOTE — Progress Notes (Signed)
[facility] MD Progress Note  05/02/2013 4:35 PM [name]  MRN:  [id number] Subjective:  [name] endorses that he is feeling more depressed. States he is afraid of relapsing. States that he is concerned for his HIV, Hep C status. He is states he is trying hard as feels he is not going to have many other chances. Will like to see if we can do something with his medications. States that even before he came here he was wanting to ask his outpatient prescriber to do something Diagnosis:   DSM5: Schizophrenia Disorders:  Shizoaffective Obsessive-Compulsive Disorders:  none Trauma-Stressor Disorders:  none Substance/Addictive Disorders:  Alcohol Related Disorder - Severe (303.90) Depressive Disorders:  Major Depressive Disorder - Moderate (296.22)  Axis I: Substance Induced Mood Disorder  ADL's:  Intact  Sleep: Fair  Appetite:  Fair  Suicidal Ideation:  Plan:  denies Intent:  denies Means:  denies Homicidal Ideation:  Plan:  denies Intent:  denies Means:  denies AEB (as evidenced by):  Psychiatric Specialty Exam: Review of Systems  Constitutional: Negative.   HENT: Negative.   Eyes: Negative.   Respiratory: Negative.   Cardiovascular: Negative.   Gastrointestinal: Negative.   Genitourinary: Negative.   Musculoskeletal: Negative.   Skin: Negative.   Neurological: Negative.   Endo/Heme/Allergies: Negative.   Psychiatric/Behavioral: Positive for depression and substance abuse. The patient is nervous/anxious.     Blood pressure 112/72, pulse 72, temperature 97.9 F (36.6 C), temperature source Oral, resp. rate 17, height 6' (1.829 m).There is no weight on file to calculate BMI.  General Appearance: Fairly Groomed  Patent attorney::  Fair  Speech:  Clear and Coherent and Slow  Volume:  Decreased  Mood:  Depressed  Affect:  Restricted  Thought Process:  Coherent and Goal Directed  Orientation:  Full (Time, Place, and Person)  Thought Content:  symptoms, worries, concerns   Suicidal Thoughts:  No  Homicidal Thoughts:  No  Memory:  Immediate;   Fair Recent;   Fair Remote;   Fair  Judgement:  Fair  Insight:  Present  Psychomotor Activity:  Restlessness  Concentration:  Fair  Recall:  Fair  Akathisia:  NA  Handed:    AIMS (if indicated):     Assets:  Desire for Improvement  Sleep:  Number of Hours: 6.5   Current Medications: Current Facility-Administered Medications  Medication Dose Route Frequency Provider Last Rate Last Dose  . abacavir (ZIAGEN) tablet 600 mg  600 mg Oral Daily Rachael Fee, MD   600 mg at 05/02/13 4098   And  . lamiVUDine (EPIVIR) tablet 300 mg  300 mg Oral Daily Rachael Fee, MD   300 mg at 05/02/13 1191  . ARIPiprazole (ABILIFY) tablet 5 mg  5 mg Oral Daily Rachael Fee, MD   5 mg at 05/02/13 4782  . atovaquone (MEPRON) 750 MG/5ML suspension 1,500 mg  1,500 mg Oral Daily Rachael Fee, MD   1,500 mg at 05/02/13 0804  . benazepril (LOTENSIN) tablet 5 mg  5 mg Oral Daily Sanjuana Kava, NP   5 mg at 05/02/13 9562  . citalopram (CELEXA) tablet 20 mg  20 mg Oral Daily Nanine Means, NP   20 mg at 05/02/13 0804  . Darunavir Ethanolate (PREZISTA) tablet 800 mg  800 mg Oral QAC breakfast Rachael Fee, MD   800 mg at 05/02/13 0636  . gabapentin (NEURONTIN) capsule 100 mg  100 mg Oral TID Nanine Means, NP   100 mg at 05/02/13 1142  .  multivitamin with minerals tablet 1 tablet  1 tablet Oral Daily Rachael Fee, MD   1 tablet at 05/02/13 0804  . omega-3 acid ethyl esters (LOVAZA) capsule 1 g  1 g Oral BID Nanine Means, NP   1 g at 05/02/13 0804  . pantoprazole (PROTONIX) EC tablet 40 mg  40 mg Oral BID Nanine Means, NP   40 mg at 05/02/13 0803  . ritonavir (NORVIR) tablet 100 mg  100 mg Oral QAC breakfast Rachael Fee, MD   100 mg at 05/02/13 0636  . thiamine (B-1) injection 100 mg  100 mg Intramuscular Once Rachael Fee, MD      . thiamine (VITAMIN B-1) tablet 100 mg  100 mg Oral Daily Rachael Fee, MD   100 mg at 05/02/13 0803  .  traZODone (DESYREL) tablet 50 mg  50 mg Oral QHS Sanjuana Kava, NP   50 mg at 05/01/13 2138    Lab Results: No results found for this or any previous visit (from the past 48 hour(s)).  Physical Findings: AIMS: Facial and Oral Movements Muscles of Facial Expression: None, normal Lips and Perioral Area: None, normal Jaw: None, normal Tongue: None, normal,Extremity Movements Upper (arms, wrists, hands, fingers): None, normal Lower (legs, knees, ankles, toes): None, normal, Trunk Movements Neck, shoulders, hips: None, normal, Overall Severity Severity of abnormal movements (highest score from questions above): None, normal Incapacitation due to abnormal movements: None, normal Patient's awareness of abnormal movements (rate only patient's report): No Awareness, Dental Status Current problems with teeth and/or dentures?: No Does patient usually wear dentures?: Yes  CIWA:  CIWA-Ar Total: 5 COWS:     Treatment Plan Summary: Daily contact with patient to assess and evaluate symptoms and progress in treatment Medication management  Plan: Supportive approach/coping skills/relapse prevention           Optimize treatment with psychotropics           Increase the Citalopram to 30 mg daily           CBT;mindfulness  Medical Decision Making Problem Points:  Review of psycho-social stressors (1) Data Points:  Review of new medications or change in dosage (2)  I certify that inpatient services furnished can reasonably be expected to improve the patient's condition.   Yerania Chamorro A 05/02/2013, 4:35 PM

## 2013-05-02 NOTE — Progress Notes (Signed)
Patient ID: [name], male   DOB: 09-18-1955, 57 y.o.   MRN: [id number] Pt did not attend the 10am AA group or the 11am psycho educational group on change.

## 2013-05-02 NOTE — [facility] Group Notes (Signed)
Adult Psychoeducational Group Note  Date:  05/02/2013 Time:  0900  Group Topic/Focus:  Orientation:   The focus of this group is to educate the patient on the purpose and policies of crisis stabilization and provide a format to answer questions about their admission.  The group details unit policies and expectations of patients while admitted.  Participation Level:  Active  Participation Quality:  Appropriate, Attentive and Sharing  Affect:  Appropriate  Cognitive:  Alert and Appropriate  Insight: Appropriate and Good  Engagement in Group:  Engaged and Supportive  Modes of Intervention:  Discussion and Support  Additional Comments:  Pt was pleasant and attentive during group, set a goal to talk with his SW about his discharge on Monday.  Pt states that he has already arranged to be picked up by his sister and she will be driving him to [facility].  [name] 05/02/2013, 9:38 AM

## 2013-05-03 MED ORDER — CITALOPRAM HYDROBROMIDE 40 MG PO TABS
40.0000 mg | ORAL_TABLET | Freq: Every day | ORAL | Status: DC
  Administered 2013-05-04 – 2013-05-05 (×2): 40 mg via ORAL
  Filled 2013-05-03 (×4): qty 1
  Filled 2013-05-03: qty 14

## 2013-05-03 NOTE — [facility] Group Notes (Signed)
[facility] LCSW Aftercare Discharge Planning Group Note   05/03/2013  8:45 AM  Participation Quality:  Did Not Attend - pt was sleeping in their room  Reyes Ivan, LCSW 05/03/2013 9:42 AM

## 2013-05-03 NOTE — Progress Notes (Signed)
(  Discharging early Monday morning) [facility] Adult Case Management Discharge Plan :  Will you be returning to the same living situation after discharge: Yes,  pt can return home after treatment At discharge, do you have transportation home?:Yes,  access to transportation Do you have the ability to pay for your medications:Yes,  access to meds  Release of information consent forms completed and in the chart;  Patient's signature needed at discharge.  Patient to Follow up at: Follow-up Information   Follow up with [facility] On 05/06/2013. (Arrive by 8am for screening and possible admission. Make sure to bring ID, medication supply, and clothing. )    Contact information:   [address] [place], [place] [address] Phone: [phone] Fax: [phone]      Follow up with [facility]. (Walk in between 8am-9am Monday through Friday for hospital follow-up, medication management, and assessment for therapy services. )    Contact information:   [address], [place] [address] Phone: [phone] Fax: [phone]      Patient denies SI/HI:   Yes,  denies SI/HI    Safety Planning and Suicide Prevention discussed:  Yes,  discussed with pt.  N/A to contact family/friend to provide suicide prevention information.  See suicide prevention education note.    Carmina Miller 05/03/2013, 3:18 PM

## 2013-05-03 NOTE — [facility] Group Notes (Signed)
[facility] LCSW Group Therapy  05/03/2013  1:15 PM   Type of Therapy:  Group Therapy  Participation Level:  Active  Participation Quality:  Appropriate, Attentive, Sharing and Supportive  Affect:  Calm  Cognitive:  Alert and Oriented  Insight:  Developing/Improving and Engaged  Engagement in Therapy:  Developing/Improving and Engaged  Modes of Intervention:  Clarification, Confrontation, Discussion, Education, Exploration, Limit-setting, Orientation, Problem-solving, Rapport Building, Dance movement psychotherapist, Socialization and Support  Summary of Progress/Problems: The topic for today was feelings about relapse.  Pt discussed what relapse prevention is to them and identified triggers that they are on the path to relapse.  Pt processed their feeling towards relapse and was able to relate to peers.  Pt discussed coping skills that can be used for relapse prevention.   Pt came at the end of group discussion but actively participated and was engaged in group discussion.    Reyes Ivan, LCSW 05/03/2013 3:01 PM

## 2013-05-03 NOTE — Tx Team (Signed)
Interdisciplinary Treatment Plan Update (Adult)  Date: 05/03/2013  Time Reviewed:  9:45 AM  Progress in Treatment: Attending groups: Yes Participating in groups:  Yes Taking medication as prescribed:  Yes Tolerating medication:  Yes Family/Significant othe contact made: No, N/A Patient understands diagnosis:  Yes Discussing patient identified problems/goals with staff:  Yes Medical problems stabilized or resolved:  Yes Denies suicidal/homicidal ideation: Yes Issues/concerns per patient self-inventory:  Yes Other:  New problem(s) identified: N/A  Discharge Plan or Barriers: Pt will follow up at [facility] for further inpatient treatment.    Reason for Continuation of Hospitalization: Anxiety Depression Medication Stabilization  Comments: N/A  Estimated length of stay: 3 days, d/c on Monday  For review of initial/current patient goals, please see plan of care.  Attendees: Patient:     Family:     Physician:  Dr. Dub Mikes 05/03/2013 10:08 AM   Nursing:   Enzo Bi, RN 05/03/2013 10:08 AM   Clinical Social Worker:  Reyes Ivan, LCSW 05/03/2013 10:08 AM   Other: Burnetta Sabin, RN 05/03/2013 10:09 AM   Other: Serena Colonel, NP 05/03/2013 10:09 AM   Other:    Other:     Other:    Other:    Other:    Other:    Other:    Other:     Scribe for Treatment Team:   Carmina Miller, 05/03/2013 , 10:08 AM

## 2013-05-03 NOTE — Progress Notes (Signed)
Patient ID: [name], male   DOB: 1956/03/14, 57 y.o.   MRN: [id number] D. Patient presents with depressed mood, but brightens upon approach. He states '' I'm feeling much better today, he's increased my depression medication and I'm feeling better. I'm feeling good about my follow up plan, and I'm looking forward to going to [facility]. I see now I need long term treatment'' Pt completed self inventory and rates depression at 4/10 on depression scale, 10 being worst depression 1 being least. A. Medications given as ordered. Support and encouragement provided. Discussed above with treatment team/MD. R. Patient remains calm and cooperative at this time, in no acute distress. Will continue to monitor q 15 minutes for safety.

## 2013-05-03 NOTE — Progress Notes (Signed)
Recreation Therapy Notes  Date: 12.11.2014 Time: 3:00pm Location: [address] Dayroom   Group Topic: Communication, Team Building, Problem Solving  Goal Area(s) Addresses:  Patient will effectively work with peer towards shared goal.  Patient will identify skill used to make activity successful.  Patient will identify how skills used during activity can be used to reach post d/c goals.   Behavioral Response: Engaged, Attentive, Appropriate  Intervention: Problem Solving Activity  Activity: Landing Pad. In teams patients were given 12 plastic drinking straws and a length of masking tape. Using the materials provided patients were asked to build a landing pad to catch a golf ball dropped from approximately 6 feet in the air.   Education: Pharmacist, community, Building control surveyor.    Education Outcome: Acknowledges education   Clinical Observations/Feedback: Patient actively engaged in group activity, working well with team mate, sharing ideas and assisting with construction of team landing pad. Patient contributed to group discussion identifying importance of healthy communication, as well as working with in a team. Patient additionally identified the problem solving skills he used within his team.   Jearl Klinefelter, LRT/CTRS  Jearl Klinefelter 05/03/2013 11:19 AM

## 2013-05-03 NOTE — Progress Notes (Signed)
[facility] MD Progress Note  05/03/2013 4:03 PM TILFORD DEATON  MRN:  [id number] Subjective:  Jasiyah has tolerated the increase in Celexa well. He is still endorsing the depressed mood. He is anticipating being able to get to [facility] Monday AM. He states he really will wants to stay the 90 days. Would not trust himself being out there without more help. When he gets this depressed he becomes hopeless, does not feel well and starts thinking about using Diagnosis:   DSM5: Schizophrenia Disorders:  Schizoaffective  Obsessive-Compulsive Disorders:  none Trauma-Stressor Disorders:  none Substance/Addictive Disorders:  Alcohol Related Disorder - Severe (303.90) Depressive Disorders:  Major Depressive Disorder - Moderate (296.22)  Axis I: Substance Induced Mood Disorder  ADL's:  Intact  Sleep: Fair  Appetite:  Fair  Suicidal Ideation:  Plan:  denies Intent:  denies Means:  denies Homicidal Ideation:  Plan:  denies Intent:  denies Means:  denies AEB (as evidenced by):  Psychiatric Specialty Exam: Review of Systems  Constitutional: Negative.   HENT: Negative.   Eyes: Negative.   Respiratory: Negative.   Cardiovascular: Negative.   Gastrointestinal: Negative.   Genitourinary: Negative.   Musculoskeletal: Negative.   Skin: Negative.   Neurological: Negative.   Endo/Heme/Allergies: Negative.   Psychiatric/Behavioral: Positive for depression and substance abuse. The patient is nervous/anxious.     Blood pressure 101/72, pulse 75, temperature 98.2 F (36.8 C), temperature source Oral, resp. rate 18, height 6' (1.829 m).There is no weight on file to calculate BMI.  General Appearance: Fairly Groomed  Patent attorney::  Fair  Speech:  Clear and Coherent, Slow and not spontaneous  Volume:  Decreased  Mood:  Depressed  Affect:  anxious, worried  Thought Process:  Coherent and Goal Directed  Orientation:  Full (Time, Place, and Person)  Thought Content:  worries, concerns, fear of  relapsing  Suicidal Thoughts:  No  Homicidal Thoughts:  No  Memory:  Immediate;   Fair Recent;   Fair Remote;   Fair  Judgement:  Fair  Insight:  Shallow  Psychomotor Activity:  Restlessness  Concentration:  Fair  Recall:  Fair  Akathisia:  NA  Handed:    AIMS (if indicated):     Assets:  Desire for Improvement  Sleep:  Number of Hours: 6   Current Medications: Current Facility-Administered Medications  Medication Dose Route Frequency Provider Last Rate Last Dose  . abacavir (ZIAGEN) tablet 600 mg  600 mg Oral Daily Rachael Fee, MD   600 mg at 05/03/13 0749   And  . lamiVUDine (EPIVIR) tablet 300 mg  300 mg Oral Daily Rachael Fee, MD   300 mg at 05/03/13 0749  . ARIPiprazole (ABILIFY) tablet 5 mg  5 mg Oral Daily Rachael Fee, MD   5 mg at 05/03/13 0749  . atovaquone (MEPRON) 750 MG/5ML suspension 1,500 mg  1,500 mg Oral Daily Rachael Fee, MD   1,500 mg at 05/03/13 0748  . benazepril (LOTENSIN) tablet 5 mg  5 mg Oral Daily Sanjuana Kava, NP   5 mg at 05/03/13 0749  . citalopram (CELEXA) tablet 30 mg  30 mg Oral Daily Rachael Fee, MD   30 mg at 05/03/13 0749  . Darunavir Ethanolate (PREZISTA) tablet 800 mg  800 mg Oral QAC breakfast Rachael Fee, MD   800 mg at 05/03/13 0617  . gabapentin (NEURONTIN) capsule 100 mg  100 mg Oral TID Nanine Means, NP   100 mg at 05/03/13 1145  .  multivitamin with minerals tablet 1 tablet  1 tablet Oral Daily Rachael Fee, MD   1 tablet at 05/03/13 0749  . omega-3 acid ethyl esters (LOVAZA) capsule 1 g  1 g Oral BID Nanine Means, NP   1 g at 05/03/13 0749  . pantoprazole (PROTONIX) EC tablet 40 mg  40 mg Oral BID Nanine Means, NP   40 mg at 05/03/13 0749  . ritonavir (NORVIR) tablet 100 mg  100 mg Oral QAC breakfast Rachael Fee, MD   100 mg at 05/03/13 1610  . thiamine (B-1) injection 100 mg  100 mg Intramuscular Once Rachael Fee, MD      . thiamine (VITAMIN B-1) tablet 100 mg  100 mg Oral Daily Rachael Fee, MD   100 mg at 05/03/13 0748   . traZODone (DESYREL) tablet 50 mg  50 mg Oral QHS Sanjuana Kava, NP   50 mg at 05/02/13 2149    Lab Results: No results found for this or any previous visit (from the past 48 hour(s)).  Physical Findings: AIMS: Facial and Oral Movements Muscles of Facial Expression: None, normal Lips and Perioral Area: None, normal Jaw: None, normal Tongue: None, normal,Extremity Movements Upper (arms, wrists, hands, fingers): None, normal Lower (legs, knees, ankles, toes): None, normal, Trunk Movements Neck, shoulders, hips: None, normal, Overall Severity Severity of abnormal movements (highest score from questions above): None, normal Incapacitation due to abnormal movements: None, normal Patient's awareness of abnormal movements (rate only patient's report): No Awareness, Dental Status Current problems with teeth and/or dentures?: No Does patient usually wear dentures?: Yes  CIWA:  CIWA-Ar Total: 5 COWS:     Treatment Plan Summary: Daily contact with patient to assess and evaluate symptoms and progress in treatment Medication management  Plan: Supportive approach/coping skills/relapse prevention           Consider increasing the Celexa to 40 mg  Medical Decision Making Problem Points:  Review of psycho-social stressors (1) Data Points:  Review of new medications or change in dosage (2)  I certify that inpatient services furnished can reasonably be expected to improve the patient's condition.   Audy Dauphine A 05/03/2013, 4:03 PM

## 2013-05-03 NOTE — Progress Notes (Signed)
D.  Pt pleasant on approach, denies complaints at this time.  Positive for evening AA group, interacting appropriately within milieu.  Denies SI/HI/hallucinations at this time.  A.  Support and encouragement offered  R.  Pt remains safe on unit, will continue to monitor. 

## 2013-05-04 NOTE — [facility] Group Notes (Signed)
[facility] Group Notes:  (Nursing/MHT/Case Management/Adjunct)  Date:  05/04/2013  Time: 1315    Type of Therapy: Psychoeducational Skills  Participation Level: Active  Participation Quality: Appropriate  Affect: Appropriate  Cognitive: Alert  Insight: Appropriate  Engagement in Group: Engaged  Modes of Intervention: Activity, Discussion and Education  Summary of Progress/Problems:  healthy coping skill review - psychoeducational video with a  focus on recovery and healthy coping with addiction/substance abuse.          Malva Limes 05/04/2013, 2:47 PM

## 2013-05-04 NOTE — [facility] Group Notes (Signed)
[facility] Group Notes:  (Clinical Social Work)  05/04/2013     10-11AM  Summary of Progress/Problems:   The main focus of today's process group was for the patient to identify ways in which they have in the past sabotaged their own recovery. Motivational Interviewing was utilized to ask the group members what they get out of their substance use, and what reasons they may have for wanting to change.  The Stages of Change were explained using a handout, and patients identified where they currently are with regard to stages of change.  The patient expressed that he is a selfish person and that he drinks because it makes him feel good.  He went from the structure of the [facility] to no structure at all, and could not handle it.  He stated that if he does not stop drinking, all that awaits him is "jail, institutions and death."  He is in the Preparation Stage of Change, is going to [facility] (he states for 90 days, not just 30), then wants to see about getting into the [facility] for a year.  Type of Therapy:  Group Therapy - Process   Participation Level:  Active  Participation Quality:  Drowsy and Sharing  Affect:  Blunted  Cognitive:  Appropriate  Insight:  Developing/Improving  Engagement in Therapy:  Developing/Improving  Modes of Intervention:  Education, Support and Processing, Motivational Interviewing  Ambrose Mantle, LCSW 05/04/2013, 1:10 PM

## 2013-05-04 NOTE — [facility] Group Notes (Signed)
[facility] Group Notes:  (Nursing/MHT/Case Management/Adjunct)  Date:  05/04/2013  Time:  10:58 AM  Type of Therapy:  Psychoeducational Skills  Participation Level:  Active  Participation Quality:  Appropriate  Affect:  Appropriate  Cognitive:  Alert  Insight:  Appropriate  Engagement in Group:  Engaged  Modes of Intervention:  Activity, Discussion and Education  Summary of Progress/Problems: Self inventory review with RN , healthy coping skill review with focus on recovery and healthy coping with addiction.  [name] 05/04/2013, 10:58 AM
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
## 2013-05-04 NOTE — Progress Notes (Signed)
D.  Pt pleasant and appropriate on approach, denies complaints of any kind . Pt feels ready for discharge on Monday to [facility].  Denies SI/HI/hallucinations at this time.  Interacting appropriately within milieu.  Positive for evening wrap up group, see group notes.  A.  Support and encouragement offered  R.  Pt remains safe on unit, will continue to monitor.

## 2013-05-04 NOTE — Progress Notes (Signed)
[facility] Group Notes:  (Nursing/MHT/Case Management/Adjunct)  Date:  05/04/2013  Time:  2100  Type of Therapy:  wrap up group  Participation Level:  Active  Participation Quality:  Appropriate, Attentive, Sharing and Supportive  Affect:  Appropriate  Cognitive:  Alert  Insight:  Improving  Engagement in Group:  Engaged  Modes of Intervention:  Clarification, Education and Support  Summary of Progress/Problems:  [name] 05/04/2013, 10:33 PM

## 2013-05-05 DIAGNOSIS — F10239 Alcohol dependence with withdrawal, unspecified: Principal | ICD-10-CM

## 2013-05-05 DIAGNOSIS — F1010 Alcohol abuse, uncomplicated: Secondary | ICD-10-CM

## 2013-05-05 DIAGNOSIS — F1910 Other psychoactive substance abuse, uncomplicated: Secondary | ICD-10-CM

## 2013-05-05 DIAGNOSIS — F332 Major depressive disorder, recurrent severe without psychotic features: Secondary | ICD-10-CM

## 2013-05-05 DIAGNOSIS — F1994 Other psychoactive substance use, unspecified with psychoactive substance-induced mood disorder: Secondary | ICD-10-CM

## 2013-05-05 MED ORDER — ARIPIPRAZOLE 5 MG PO TABS: 5.0000 mg | ORAL_TABLET | Freq: Every day | ORAL | Status: DC

## 2013-05-05 MED ORDER — TRAZODONE HCL 50 MG PO TABS: 50.0000 mg | ORAL_TABLET | Freq: Every day | ORAL | Status: DC

## 2013-05-05 MED ORDER — DARUNAVIR ETHANOLATE 800 MG PO TABS: 800.0000 mg | ORAL_TABLET | Freq: Every day | ORAL | Status: DC

## 2013-05-05 MED ORDER — GABAPENTIN 100 MG PO CAPS: 100.0000 mg | ORAL_CAPSULE | Freq: Three times a day (TID) | ORAL | Status: DC

## 2013-05-05 MED ORDER — BENAZEPRIL HCL 5 MG PO TABS: 5.0000 mg | ORAL_TABLET | Freq: Every day | ORAL | Status: DC

## 2013-05-05 MED ORDER — CITALOPRAM HYDROBROMIDE 40 MG PO TABS: 40.0000 mg | ORAL_TABLET | Freq: Every day | ORAL | Status: DC

## 2013-05-05 NOTE — Progress Notes (Signed)
D.  Pt pleasant on approach, positive for evening AA group, no complaints voiced.  Pt ready to be discharged in AM, Pt will go to [facility] via his sister who is picking him up here.  Denies SI/HI/hallucinations at this time.  Interacting appropriately within milieu.  A.  Support and encouragement offered  R.  Remains safe on unit, will continue to monitor.
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
## 2013-05-05 NOTE — [facility] Group Notes (Signed)
[facility] Group Notes:  (Nursing/MHT/Case Management/Adjunct)  Date:  05/05/2013  Time:  9:58 AM  Type of Therapy:  Psychoeducational Skills  Participation Level:  Minimal  Participation Quality:  Drowsy  Affect:  Appropriate  Cognitive:  Appropriate  Insight:  Good  Engagement in Group:  Improving  Modes of Intervention:  Discussion, Education and Exploration  Summary of Progress/Problems: self inventory review, psycho-educational review with focus on healthy support systems regarding Recovery and Addiction.  Malva Limes 05/05/2013, 9:58 AM

## 2013-05-05 NOTE — Progress Notes (Signed)
Patient ID: [name], male   DOB: 1955-12-27, 57 y.o.   MRN: [id number] D. Patient presents with pleasant mood, bright affect, smiling in am. He states '' I'm feeling much better , the celexa is helping and I see now how important my recovery is . I can choose life  Or death. I am going to follow through '' Pt completed self inventory and rates depression at 2/10 on depression scale, 10 being worst depression 1 being least. A. Medications given as ordered. Support and encouragement provided.. R. Patient remains calm and cooperative at this time, in no acute distress. Will continue to monitor q 15 minutes for safety.

## 2013-05-05 NOTE — [facility] Group Notes (Signed)
[facility] Group Notes:  (Clinical Social Work)  05/05/2013  10:00-11:00AM  Summary of Progress/Problems:   The main focus of today's process group was to   identify the patient's current support system and decide on other supports that can be put in place.  The picture on workbook was used to discuss why additional supports are needed, and a hand-out was distributed with four definitions/levels of support, then used to talk about how patients have given and received all different kinds of support.  An emphasis was placed on using counselor, doctor, therapy groups, 12-step groups, and problem-specific support groups to expand supports.   There was also an extensive discussion about what constitutes a healthy support versus an unhealthy support.  The patient expressed full comprehension of the concepts presented, and agreed that there is a need to add more supports.  The patient stated he is willing to add a sponsor at AA, as well as to get phone numbers from other group attendees.  His sister is picking him up tomorrow morning to take to [facility].  Type of Therapy:  Process Group with Motivational Interviewing  Participation Level:  Active  Participation Quality:  Attentive, Drowsy and Sharing  Affect:  Appropriate  Cognitive:  Appropriate and Oriented  Insight:  Engaged  Engagement in Therapy:  Engaged  Modes of Intervention:   Education, Support and Processing, Activity  [employer], LCSW 05/05/2013, 12:15pm
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
## 2013-05-05 NOTE — Progress Notes (Signed)
Patient did attend the evening speaker AA meeting.  

## 2013-05-06 NOTE — Progress Notes (Addendum)
Pt to discharge this AM via his sister to [facility]  Paperwork signed and in chart, Pt denies suicidal ideation of any kind.  Pt states he feels ready to discharge.  Pt is bright and cheerful this AM.  Pt signed for and collected his belongings from locker 17.  Pt has possession of his medication samples with discharge instructions.  Pt denied having any questions.    1610 Escorted Pt with his belongings to his sister's vehicle.  Continued to deny any suicidal ideation of any kind, mood very bright and appropriate.

## 2013-05-06 NOTE — Telephone Encounter (Signed)
I cant write the note while he is being worked up by [employer]

## 2013-05-09 NOTE — Progress Notes (Addendum)
Patient Discharge Instructions:  After Visit Summary (AVS):   Faxed to:  05/09/13 Discharge Summary Note:   Faxed to:  05/09/13 Psychiatric Admission Assessment Note:   Faxed to:  05/09/13 Suicide Risk Assessment - Discharge Assessment:   Faxed to:  05/09/13 Faxed/Sent to the Next Level Care provider:  05/09/13 Faxed to [facility] @ [phone] Faxed to [facility] @ [phone]  Jerelene Redden, 05/09/2013, 3:36 PM

## 2013-06-18 ENCOUNTER — Other Ambulatory Visit: Payer: Self-pay | Admitting: Infectious Disease

## 2013-06-18 ENCOUNTER — Other Ambulatory Visit: Payer: PRIVATE HEALTH INSURANCE

## 2013-06-18 DIAGNOSIS — B20 Human immunodeficiency virus [HIV] disease: Secondary | ICD-10-CM

## 2013-06-18 DIAGNOSIS — E785 Hyperlipidemia, unspecified: Secondary | ICD-10-CM

## 2013-06-18 LAB — CBC WITH DIFFERENTIAL/PLATELET
Basophils Absolute: 0 10*3/uL (ref 0.0–0.1)
Basophils Relative: 0 % (ref 0–1)
Eosinophils Absolute: 0.2 10*3/uL (ref 0.0–0.7)
Eosinophils Relative: 3 % (ref 0–5)
HCT: 42.7 % (ref 39.0–52.0)
Hemoglobin: 14.5 g/dL (ref 13.0–17.0)
Lymphocytes Relative: 27 % (ref 12–46)
Lymphs Abs: 1.5 10*3/uL (ref 0.7–4.0)
MCH: 32.7 pg (ref 26.0–34.0)
MCHC: 34 g/dL (ref 30.0–36.0)
MCV: 96.4 fL (ref 78.0–100.0)
Monocytes Absolute: 0.5 10*3/uL (ref 0.1–1.0)
Monocytes Relative: 9 % (ref 3–12)
Neutro Abs: 3.3 10*3/uL (ref 1.7–7.7)
Neutrophils Relative %: 61 % (ref 43–77)
Platelets: 149 10*3/uL — ABNORMAL LOW (ref 150–400)
RBC: 4.43 MIL/uL (ref 4.22–5.81)
RDW: 13.2 % (ref 11.5–15.5)
WBC: 5.5 10*3/uL (ref 4.0–10.5)

## 2013-06-19 LAB — LIPID PANEL
Cholesterol: 131 mg/dL (ref 0–200)
HDL: 33 mg/dL — ABNORMAL LOW (ref >39)
LDL Cholesterol: 79 mg/dL (ref 0–99)
Total CHOL/HDL Ratio: 4 Ratio
Triglycerides: 94 mg/dL (ref <150)
VLDL: 19 mg/dL (ref 0–40)

## 2013-06-19 LAB — COMPREHENSIVE METABOLIC PANEL
ALT: 76 U/L — ABNORMAL HIGH (ref 0–53)
AST: 72 U/L — ABNORMAL HIGH (ref 0–37)
Albumin: 3.8 g/dL (ref 3.5–5.2)
Alkaline Phosphatase: 59 U/L (ref 39–117)
BUN: 9 mg/dL (ref 6–23)
CO2: 28 mEq/L (ref 19–32)
Calcium: 9.3 mg/dL (ref 8.4–10.5)
Chloride: 102 mEq/L (ref 96–112)
Creat: 0.88 mg/dL (ref 0.50–1.35)
Glucose, Bld: 88 mg/dL (ref 70–99)
Potassium: 3.9 mEq/L (ref 3.5–5.3)
Sodium: 137 mEq/L (ref 135–145)
Total Bilirubin: 0.8 mg/dL (ref 0.3–1.2)
Total Protein: 8.1 g/dL (ref 6.0–8.3)

## 2013-06-19 LAB — HIV-1 RNA QUANT-NO REFLEX-BLD
HIV 1 RNA Quant: 82 copies/mL — ABNORMAL HIGH (ref <20)
HIV-1 RNA Quant, Log: 1.91 {Log} — ABNORMAL HIGH (ref <1.30)

## 2013-06-19 LAB — T-HELPER CELL (CD4) - (RCID CLINIC ONLY)
CD4 % Helper T Cell: 10 % — ABNORMAL LOW (ref 33–55)
CD4 T Cell Abs: 160 /uL — ABNORMAL LOW (ref 400–2700)

## 2013-07-10 ENCOUNTER — Ambulatory Visit: Payer: Self-pay | Admitting: Infectious Disease

## 2013-07-11 ENCOUNTER — Ambulatory Visit (INDEPENDENT_AMBULATORY_CARE_PROVIDER_SITE_OTHER): Payer: PRIVATE HEALTH INSURANCE | Admitting: Infectious Disease

## 2013-07-11 ENCOUNTER — Encounter: Payer: Self-pay | Admitting: Infectious Disease

## 2013-07-11 ENCOUNTER — Telehealth: Payer: Self-pay | Admitting: *Deleted

## 2013-07-11 VITALS — BP 119/88 | HR 86 | Temp 98.1°F | Wt 226.0 lb

## 2013-07-11 DIAGNOSIS — F1490 Cocaine use, unspecified, uncomplicated: Secondary | ICD-10-CM

## 2013-07-11 DIAGNOSIS — IMO0001 Reserved for inherently not codable concepts without codable children: Secondary | ICD-10-CM

## 2013-07-11 DIAGNOSIS — F1410 Cocaine abuse, uncomplicated: Secondary | ICD-10-CM

## 2013-07-11 DIAGNOSIS — Z653 Problems related to other legal circumstances: Secondary | ICD-10-CM

## 2013-07-11 DIAGNOSIS — I10 Essential (primary) hypertension: Secondary | ICD-10-CM

## 2013-07-11 DIAGNOSIS — F1020 Alcohol dependence, uncomplicated: Secondary | ICD-10-CM

## 2013-07-11 DIAGNOSIS — B20 Human immunodeficiency virus [HIV] disease: Secondary | ICD-10-CM

## 2013-07-11 DIAGNOSIS — B1920 Unspecified viral hepatitis C without hepatic coma: Secondary | ICD-10-CM

## 2013-07-11 DIAGNOSIS — N19 Unspecified kidney failure: Secondary | ICD-10-CM

## 2013-07-11 NOTE — Telephone Encounter (Signed)
Letter needed to state that he is capable of continuing his classes/school attendance.  See Telephone Note dated 04/22/13 for more information.  Angelique Blonderenise

## 2013-07-11 NOTE — Progress Notes (Signed)
Subjective:    Patient ID: [name], male    DOB: 1956-03-20, 58 y.o.   MRN: [id number]  HPI   58 year old ever American man with HIV who had been incarcerated. 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 He ultimately had been admitted to [facility] where he had suffered from MRSA bacteremia. He was treated with IV antibiotics and ultimately discharged and [facility]   His HIV has since been  perfectly controlled with a viral load of less than 20 to 83 (most recent) copies on Epzicom Prezista and Norvir. His CD4 count is up to 160. He is taking atovaquone for PCP prophylaxis.  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    He requested letter from me stating that he was medically ready to go back to school and I believe he is as long as his substance abuse is under control. He is going to a program via bus at least weekly.   Review of Systems  Constitutional: Negative for fever, chills, diaphoresis, activity change, appetite change, fatigue and unexpected weight change.  HENT: Negative for congestion, rhinorrhea, sinus pressure, sneezing, sore throat and trouble swallowing.   Eyes: Negative for photophobia and visual disturbance.  Respiratory: Negative for cough, chest tightness, shortness of breath, wheezing and stridor.   Cardiovascular: Negative for chest pain, palpitations and leg swelling.  Gastrointestinal: Negative for nausea, vomiting, abdominal pain, diarrhea, constipation, blood in stool, abdominal distention and anal bleeding.  Genitourinary: Negative for dysuria, hematuria, flank pain and difficulty urinating.  Musculoskeletal: Negative for arthralgias, back pain, gait problem, joint swelling and myalgias.  Skin: Negative for  color change, pallor, rash and wound.  Neurological: Negative for dizziness, tremors, weakness and light-headedness.  Hematological: Negative for adenopathy. Does not bruise/bleed easily.  Psychiatric/Behavioral: Negative for behavioral problems, confusion, sleep disturbance, dysphoric mood, decreased concentration and agitation.       Objective:   Physical Exam  Constitutional: He is oriented to person, place, and time. He appears well-developed and well-nourished. No distress.  HENT:  Head: Normocephalic and atraumatic.  Mouth/Throat: Oropharynx is clear and moist. No oropharyngeal exudate.  Eyes: Conjunctivae and EOM are normal. Pupils are equal, round, and reactive to light. No scleral icterus.  Neck: Normal range of motion. Neck supple. No JVD present.  Cardiovascular: Normal rate, regular rhythm and normal heart sounds.  Exam reveals no gallop and no friction rub.   No murmur heard. Pulmonary/Chest: Effort normal and breath sounds normal. No respiratory distress. He has no wheezes. He has no rales. He exhibits no tenderness.  Abdominal: He exhibits no distension and no mass. There is no tenderness. There is no rebound and no guarding.  Musculoskeletal: He exhibits no edema and no tenderness.  Lymphadenopathy:    He has no cervical adenopathy.  Neurological: He is alert and oriented to person, place, and time. He has normal reflexes. He exhibits normal muscle tone. Coordination normal.  Skin: Skin is warm and dry. He is not diaphoretic. No erythema. No pallor.  Psychiatric: He has a normal mood and affect. His behavior is normal. Judgment and thought content normal.          Assessment & Plan:   #1 HIV-AIDS: Continue Prezista Norvir Epzicom continue atovaquone prophylaxis.   #  2 Crack cocaine and etoh abuse: relapse in December with admission and treatment in rx program. I spent greater than 25 minutes with the patient including greater than 50% of time in face to face counsel  of the patient and in coordination of their care.  I also wrote him his requested letter   #2 Renal insufficiency resolved.  #4 hypertension : pretty well controlled at present   #5 Hep C genotype 1: wold like to treat him with new oral regimen as long as he is in stable environment

## 2013-07-11 NOTE — Telephone Encounter (Signed)
Thanks I just wrote it for him

## 2013-07-11 NOTE — Telephone Encounter (Signed)
What letter ?

## 2013-07-11 NOTE — Telephone Encounter (Signed)
Wanted MD to know he would be asking for this letter at his OV this afternoon.

## 2013-07-22 ENCOUNTER — Telehealth: Payer: Self-pay | Admitting: *Deleted

## 2013-07-22 NOTE — Telephone Encounter (Signed)
Pt shared that his school rejected his request to go to school stating that the letter written by Dr. Daiva EvesVan Dam was not specific enough about his current physical, mental and substance abuse status.  Pt requests a more detailed letter from Dr. Daryll DrownVan Dan addressing his HIV being under control, his depressive symptoms being improved and the he has completed "detox."  Pt shared that he needs this letter prior to a March 23rd deadline.  Pt requests MD please compose letter to this effect.

## 2013-07-22 NOTE — Telephone Encounter (Signed)
Well I am afraid that the letter with regards to DETOX needs to come from a psychiatrist as I DID NOT treat him for that. I can comment on his HIV. Dr. Dub MikesLugo treated him as an inpatient.

## 2013-07-23 NOTE — Telephone Encounter (Signed)
Thank you Denise-

## 2013-07-31 ENCOUNTER — Encounter (HOSPITAL_COMMUNITY): Payer: Self-pay | Admitting: Emergency Medicine

## 2013-07-31 ENCOUNTER — Emergency Department (HOSPITAL_COMMUNITY)
Admission: EM | Admit: 2013-07-31 | Discharge: 2013-07-31 | Discharge status: Against Medical Advice | Payer: PRIVATE HEALTH INSURANCE | Attending: Emergency Medicine | Admitting: Emergency Medicine

## 2013-07-31 DIAGNOSIS — Z21 Asymptomatic human immunodeficiency virus [HIV] infection status: Secondary | ICD-10-CM | POA: Insufficient documentation

## 2013-07-31 DIAGNOSIS — F17200 Nicotine dependence, unspecified, uncomplicated: Secondary | ICD-10-CM | POA: Insufficient documentation

## 2013-07-31 DIAGNOSIS — J449 Chronic obstructive pulmonary disease, unspecified: Secondary | ICD-10-CM | POA: Insufficient documentation

## 2013-07-31 DIAGNOSIS — S0990XA Unspecified injury of head, initial encounter: Secondary | ICD-10-CM | POA: Insufficient documentation

## 2013-07-31 DIAGNOSIS — F1010 Alcohol abuse, uncomplicated: Secondary | ICD-10-CM | POA: Insufficient documentation

## 2013-07-31 DIAGNOSIS — J4489 Other specified chronic obstructive pulmonary disease: Secondary | ICD-10-CM | POA: Insufficient documentation
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
## 2013-08-01 ENCOUNTER — Telehealth: Payer: Self-pay | Admitting: *Deleted

## 2013-08-01 NOTE — Telephone Encounter (Signed)
Patient called requesting a new letter for school stating what Dr. Daiva EvesVan Dam is treating him for (his HIV). He is requesting that this letter be mailed to his home.  Wendall MolaJacqueline Cockerham

## 2013-08-05 ENCOUNTER — Telehealth: Payer: Self-pay | Admitting: *Deleted

## 2013-08-05 NOTE — Telephone Encounter (Signed)
Patient called, stating he needs a letter for school with more detail.  Advised patient that he would have to speak with Dr. Daiva EvesVan Dam if the previous letter written was not enough.  Appointment given with DR. Daiva EvesVan Dam for 3/19. Andree CossHowell, Michelle M, RN

## 2013-08-08 ENCOUNTER — Ambulatory Visit (INDEPENDENT_AMBULATORY_CARE_PROVIDER_SITE_OTHER): Payer: PRIVATE HEALTH INSURANCE | Admitting: Infectious Disease

## 2013-08-08 ENCOUNTER — Other Ambulatory Visit (HOSPITAL_COMMUNITY)
Admission: RE | Admit: 2013-08-08 | Discharge: 2013-08-08 | Disposition: A | Discharge status: Home / Self Care | Payer: PRIVATE HEALTH INSURANCE | Source: Ambulatory Visit | Attending: Infectious Disease | Admitting: Infectious Disease

## 2013-08-08 ENCOUNTER — Encounter: Payer: Self-pay | Admitting: Infectious Disease

## 2013-08-08 VITALS — BP 134/88 | HR 80 | Temp 98.2°F | Wt 225.0 lb

## 2013-08-08 DIAGNOSIS — Z113 Encounter for screening for infections with a predominantly sexual mode of transmission: Secondary | ICD-10-CM | POA: Insufficient documentation

## 2013-08-08 DIAGNOSIS — F3289 Other specified depressive episodes: Secondary | ICD-10-CM

## 2013-08-08 DIAGNOSIS — B1920 Unspecified viral hepatitis C without hepatic coma: Secondary | ICD-10-CM

## 2013-08-08 DIAGNOSIS — F1910 Other psychoactive substance abuse, uncomplicated: Secondary | ICD-10-CM

## 2013-08-08 DIAGNOSIS — F32A Depression, unspecified: Secondary | ICD-10-CM

## 2013-08-08 DIAGNOSIS — B20 Human immunodeficiency virus [HIV] disease: Secondary | ICD-10-CM

## 2013-08-08 DIAGNOSIS — F329 Major depressive disorder, single episode, unspecified: Secondary | ICD-10-CM

## 2013-08-08 LAB — COMPLETE METABOLIC PANEL WITH GFR
ALT: 65 U/L — ABNORMAL HIGH (ref 0–53)
AST: 90 U/L — ABNORMAL HIGH (ref 0–37)
Albumin: 3.4 g/dL — ABNORMAL LOW (ref 3.5–5.2)
Alkaline Phosphatase: 50 U/L (ref 39–117)
BUN: 9 mg/dL (ref 6–23)
CO2: 28 mEq/L (ref 19–32)
Calcium: 8.5 mg/dL (ref 8.4–10.5)
Chloride: 106 mEq/L (ref 96–112)
Creat: 0.84 mg/dL (ref 0.50–1.35)
GFR, Est African American: >89 mL/min
GFR, Est Non African American: >89 mL/min
Glucose, Bld: 93 mg/dL (ref 70–99)
Potassium: 3.6 mEq/L (ref 3.5–5.3)
Sodium: 137 mEq/L (ref 135–145)
Total Bilirubin: 0.9 mg/dL (ref 0.2–1.2)
Total Protein: 7.2 g/dL (ref 6.0–8.3)

## 2013-08-08 LAB — CBC WITH DIFFERENTIAL/PLATELET
Basophils Absolute: 0 10*3/uL (ref 0.0–0.1)
Basophils Relative: 1 % (ref 0–1)
Eosinophils Absolute: 0.1 10*3/uL (ref 0.0–0.7)
Eosinophils Relative: 4 % (ref 0–5)
HCT: 38.1 % — ABNORMAL LOW (ref 39.0–52.0)
Hemoglobin: 12.7 g/dL — ABNORMAL LOW (ref 13.0–17.0)
Lymphocytes Relative: 27 % (ref 12–46)
Lymphs Abs: 0.9 10*3/uL (ref 0.7–4.0)
MCH: 31.8 pg (ref 26.0–34.0)
MCHC: 33.3 g/dL (ref 30.0–36.0)
MCV: 95.5 fL (ref 78.0–100.0)
Monocytes Absolute: 0.4 10*3/uL (ref 0.1–1.0)
Monocytes Relative: 10 % (ref 3–12)
Neutro Abs: 2 10*3/uL (ref 1.7–7.7)
Neutrophils Relative %: 58 % (ref 43–77)
Platelets: 119 10*3/uL — ABNORMAL LOW (ref 150–400)
RBC: 3.99 MIL/uL — ABNORMAL LOW (ref 4.22–5.81)
RDW: 13.5 % (ref 11.5–15.5)
WBC: 3.5 10*3/uL — ABNORMAL LOW (ref 4.0–10.5)

## 2013-08-08 NOTE — Patient Instructions (Signed)
We will get blood work today  You can cancel the lab appt in April but keep the MAy appt with Dr Daiva EvesVan Dam to work on your hepatitis C

## 2013-08-08 NOTE — Progress Notes (Signed)
Subjective:    Patient ID: [name], male    DOB: 16-Jun-1955, 58 y.o.   MRN: [id number]  HPI   58 year old ever American man with HIV who had been incarcerated. He had developed a K-103 and K-120mutation while on Atripla. He ultimately had been admitted to [facility] where he had suffered from MRSA bacteremia. He was treated with IV antibiotics and ultimately discharged and establish care again here in the [facility].   His HIV has since been  perfectly controlled with a viral load of less than 20 to 83 (most recent) copies on Epzicom Prezista and Norvir. His CD4 count is up to 160. He is taking atovaquone for PCP prophylaxis.  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    He requested letter from me stating that he was medically ready to go back to school and I believe he is as long as his substance abuse is under control.   He came back today to see if I could re-write his letter with more specifics w re to HIV and I did this for him.         Review of Systems  Constitutional: Negative for fever, chills, diaphoresis, activity change, appetite change, fatigue and unexpected weight change.  HENT: Negative for congestion, rhinorrhea, sinus pressure, sneezing, sore throat and trouble swallowing.   Eyes: Negative for photophobia and visual disturbance.  Respiratory: Negative for cough, chest tightness, shortness of breath, wheezing and stridor.   Cardiovascular: Negative for chest pain, palpitations and leg swelling.  Gastrointestinal: Negative for nausea, vomiting, abdominal pain, diarrhea, constipation, blood in stool, abdominal distention and anal bleeding.  Genitourinary: Negative for dysuria, hematuria, flank pain and difficulty urinating.  Musculoskeletal: Negative for arthralgias,  back pain, gait problem, joint swelling and myalgias.  Skin: Negative for color change, pallor, rash and wound.  Neurological: Negative for dizziness, tremors, weakness and light-headedness.  Hematological: Negative for adenopathy. Does not bruise/bleed easily.  Psychiatric/Behavioral: Negative for behavioral problems, confusion, sleep disturbance, dysphoric mood, decreased concentration and agitation.       Objective:   Physical Exam  Constitutional: He is oriented to person, place, and time. He appears well-developed and well-nourished. No distress.  HENT:  Head: Normocephalic and atraumatic.  Mouth/Throat: Oropharynx is clear and moist. No oropharyngeal exudate.  Eyes: Conjunctivae and EOM are normal. Pupils are equal, round, and reactive to light. No scleral icterus.  Neck: Normal range of motion. Neck supple. No JVD present.  Cardiovascular: Normal rate, regular rhythm and normal heart sounds.  Exam reveals no gallop and no friction rub.   No murmur heard. Pulmonary/Chest: Effort normal and breath sounds normal. No respiratory distress. He has no wheezes. He has no rales. He exhibits no tenderness.  Abdominal: He exhibits no distension and no mass. There is no tenderness. There is no rebound and no guarding.  Musculoskeletal: He exhibits no edema and no tenderness.  Lymphadenopathy:    He has no cervical adenopathy.  Neurological: He is alert and oriented to person, place, and time. He has normal reflexes. He exhibits normal muscle tone. Coordination normal.  Skin: Skin is warm and dry. He is not diaphoretic. No erythema. No pallor.  Psychiatric: He has a normal mood and affect. His behavior is normal. Judgment and thought content normal.  Assessment & Plan:   #1 HIV-AIDS: Continue Prezista Norvir Epzicom continue atovaquone, change to Prezcobix and Epzicom when Medicare will cover the drug. CHeck labs today. I spent greater than 25 minutes with the patient including  greater than 50% of time in face to face counsel of the patient and in coordination of their care.    #2 Crack cocaine and etoh abuse: relapse in December with admission and treatment in rx program.  I also wrote him his requested letter   #2 Renal insufficiency resolved.  #4 hypertension : pretty well controlled at present   #5 Hep C genotype 1: he NEEDS to be treated given HIV and Hep C coinfection. I will check a Hep C fibrosure, recheck RNA because Medicare will likely require recent level.  I counselled him to abstain from etoh completely> He states he had a beer a few weeks ago.

## 2013-08-09 LAB — URINE CYTOLOGY ANCILLARY ONLY
Chlamydia: NEGATIVE
Neisseria Gonorrhea: NEGATIVE

## 2013-08-09 LAB — T-HELPER CELL (CD4) - (RCID CLINIC ONLY)
CD4 % Helper T Cell: 10 % — ABNORMAL LOW (ref 33–55)
CD4 T Cell Abs: 100 /uL — ABNORMAL LOW (ref 400–2700)

## 2013-08-09 LAB — RPR

## 2013-08-11 LAB — HIV-1 RNA QUANT-NO REFLEX-BLD
HIV 1 RNA Quant: 68 copies/mL — ABNORMAL HIGH (ref <20)
HIV-1 RNA Quant, Log: 1.83 {Log} — ABNORMAL HIGH (ref <1.30)

## 2013-08-12 LAB — HEPATITIS C RNA QUANTITATIVE
HCV Quantitative Log: 6.06 {Log} — ABNORMAL HIGH (ref <1.18)
HCV Quantitative: 1142434 IU/mL — ABNORMAL HIGH (ref <15)

## 2013-08-14 ENCOUNTER — Other Ambulatory Visit: Payer: Self-pay | Admitting: Infectious Disease

## 2013-08-14 ENCOUNTER — Telehealth: Payer: Self-pay | Admitting: *Deleted

## 2013-08-14 DIAGNOSIS — B20 Human immunodeficiency virus [HIV] disease: Secondary | ICD-10-CM

## 2013-08-14 LAB — HEPATITIS C VIRUS FIBROSURE
ALT: 71 IU/L — ABNORMAL HIGH (ref ?–55)
Alpha-2-Macroglobulin, Qn: 268 mg/dL (ref 110–276)
Apolipoprotein A-1: 111 mg/dL (ref 110–180)
Bilirubin, total: 0.7 mg/dL (ref 0.0–1.2)
Fibrosis Score: 0.82 — ABNORMAL HIGH (ref 0.00–0.21)
GGT: 44 IU/L (ref 0–65)
Haptoglobin: 22 mg/dL — ABNORMAL LOW (ref 34–200)
Necroinflammatory Activity Score: 0.62 — ABNORMAL HIGH (ref 0.00–0.17)

## 2013-08-14 MED ORDER — DARUNAVIR-COBICISTAT 800-150 MG PO TABS: 1.0000 | ORAL_TABLET | Freq: Every day | ORAL | Status: DC

## 2013-08-14 MED ORDER — ABACAVIR SULFATE-LAMIVUDINE 600-300 MG PO TABS: 1.0000 | ORAL_TABLET | Freq: Every day | ORAL | Status: DC

## 2013-08-14 NOTE — Telephone Encounter (Signed)
Informed Dr. Daiva EvesVan Dam that Medicare is now covering Prezcobix and d/ced Prezista/Norvir and ordered Prezcobix.  Left message that medications can be picked up at [facility] tomorrow.

## 2013-09-10 ENCOUNTER — Emergency Department (HOSPITAL_COMMUNITY)
Admission: EM | Admit: 2013-09-10 | Discharge: 2013-09-10 | Disposition: A | Discharge status: Other Acute Inpatient Hospital | Payer: PRIVATE HEALTH INSURANCE | Source: Home / Self Care

## 2013-09-10 ENCOUNTER — Encounter (HOSPITAL_COMMUNITY): Payer: Self-pay | Admitting: Emergency Medicine

## 2013-09-10 ENCOUNTER — Inpatient Hospital Stay (HOSPITAL_COMMUNITY)
Admission: AD | Admit: 2013-09-10 | Discharge: 2013-09-16 | DRG: 897 | Disposition: A | Discharge status: Rehabilitation Facility | Payer: PRIVATE HEALTH INSURANCE | Source: Intra-hospital | Attending: Psychiatry | Admitting: Psychiatry

## 2013-09-10 ENCOUNTER — Other Ambulatory Visit: Payer: Self-pay

## 2013-09-10 ENCOUNTER — Encounter (HOSPITAL_COMMUNITY): Payer: Self-pay | Admitting: *Deleted

## 2013-09-10 DIAGNOSIS — J449 Chronic obstructive pulmonary disease, unspecified: Secondary | ICD-10-CM | POA: Diagnosis present

## 2013-09-10 DIAGNOSIS — F1994 Other psychoactive substance use, unspecified with psychoactive substance-induced mood disorder: Secondary | ICD-10-CM | POA: Diagnosis present

## 2013-09-10 DIAGNOSIS — R51 Headache: Secondary | ICD-10-CM | POA: Diagnosis present

## 2013-09-10 DIAGNOSIS — F259 Schizoaffective disorder, unspecified: Secondary | ICD-10-CM | POA: Diagnosis present

## 2013-09-10 DIAGNOSIS — Z8614 Personal history of Methicillin resistant Staphylococcus aureus infection: Secondary | ICD-10-CM | POA: Insufficient documentation

## 2013-09-10 DIAGNOSIS — Z21 Asymptomatic human immunodeficiency virus [HIV] infection status: Secondary | ICD-10-CM

## 2013-09-10 DIAGNOSIS — F1210 Cannabis abuse, uncomplicated: Secondary | ICD-10-CM

## 2013-09-10 DIAGNOSIS — Z87828 Personal history of other (healed) physical injury and trauma: Secondary | ICD-10-CM | POA: Insufficient documentation

## 2013-09-10 DIAGNOSIS — Z5987 Material hardship due to limited financial resources, not elsewhere classified: Secondary | ICD-10-CM

## 2013-09-10 DIAGNOSIS — F1020 Alcohol dependence, uncomplicated: Secondary | ICD-10-CM | POA: Diagnosis not present

## 2013-09-10 DIAGNOSIS — D696 Thrombocytopenia, unspecified: Secondary | ICD-10-CM

## 2013-09-10 DIAGNOSIS — Z598 Other problems related to housing and economic circumstances: Secondary | ICD-10-CM | POA: Diagnosis not present

## 2013-09-10 DIAGNOSIS — F1420 Cocaine dependence, uncomplicated: Secondary | ICD-10-CM

## 2013-09-10 DIAGNOSIS — J4489 Other specified chronic obstructive pulmonary disease: Secondary | ICD-10-CM | POA: Diagnosis present

## 2013-09-10 DIAGNOSIS — Z79899 Other long term (current) drug therapy: Secondary | ICD-10-CM | POA: Insufficient documentation

## 2013-09-10 DIAGNOSIS — F1010 Alcohol abuse, uncomplicated: Secondary | ICD-10-CM | POA: Insufficient documentation

## 2013-09-10 DIAGNOSIS — Z87448 Personal history of other diseases of urinary system: Secondary | ICD-10-CM | POA: Insufficient documentation

## 2013-09-10 DIAGNOSIS — F329 Major depressive disorder, single episode, unspecified: Secondary | ICD-10-CM

## 2013-09-10 DIAGNOSIS — F17200 Nicotine dependence, unspecified, uncomplicated: Secondary | ICD-10-CM

## 2013-09-10 DIAGNOSIS — F411 Generalized anxiety disorder: Secondary | ICD-10-CM | POA: Diagnosis present

## 2013-09-10 DIAGNOSIS — Z8739 Personal history of other diseases of the musculoskeletal system and connective tissue: Secondary | ICD-10-CM | POA: Insufficient documentation

## 2013-09-10 DIAGNOSIS — B20 Human immunodeficiency virus [HIV] disease: Secondary | ICD-10-CM

## 2013-09-10 DIAGNOSIS — Z5989 Other problems related to housing and economic circumstances: Secondary | ICD-10-CM | POA: Diagnosis not present

## 2013-09-10 DIAGNOSIS — F321 Major depressive disorder, single episode, moderate: Secondary | ICD-10-CM | POA: Diagnosis present

## 2013-09-10 DIAGNOSIS — F3289 Other specified depressive episodes: Secondary | ICD-10-CM | POA: Insufficient documentation

## 2013-09-10 HISTORY — DX: Major depressive disorder, single episode, unspecified: F32.9

## 2013-09-10 HISTORY — DX: Depression, unspecified: F32.A

## 2013-09-10 LAB — CBC
HCT: 43.7 % (ref 39.0–52.0)
Hemoglobin: 14.7 g/dL (ref 13.0–17.0)
MCH: 32.5 pg (ref 26.0–34.0)
MCHC: 33.6 g/dL (ref 30.0–36.0)
MCV: 96.7 fL (ref 78.0–100.0)
Platelets: 125 10*3/uL — ABNORMAL LOW (ref 150–400)
RBC: 4.52 MIL/uL (ref 4.22–5.81)
RDW: 12.4 % (ref 11.5–15.5)
WBC: 4.5 10*3/uL (ref 4.0–10.5)

## 2013-09-10 LAB — COMPREHENSIVE METABOLIC PANEL
ALT: 75 U/L — ABNORMAL HIGH (ref 0–53)
AST: 113 U/L — ABNORMAL HIGH (ref 0–37)
Albumin: 3.5 g/dL (ref 3.5–5.2)
Alkaline Phosphatase: 69 U/L (ref 39–117)
BUN: 8 mg/dL (ref 6–23)
CO2: 28 mEq/L (ref 19–32)
Calcium: 9.8 mg/dL (ref 8.4–10.5)
Chloride: 101 mEq/L (ref 96–112)
Creatinine, Ser: 0.8 mg/dL (ref 0.50–1.35)
GFR calc Af Amer: >90 mL/min (ref >90)
GFR calc non Af Amer: >90 mL/min (ref >90)
Glucose, Bld: 113 mg/dL — ABNORMAL HIGH (ref 70–99)
Potassium: 3.8 mEq/L (ref 3.7–5.3)
Sodium: 140 mEq/L (ref 137–147)
Total Bilirubin: 0.7 mg/dL (ref 0.3–1.2)
Total Protein: 8.5 g/dL — ABNORMAL HIGH (ref 6.0–8.3)

## 2013-09-10 LAB — ETHANOL: Alcohol, Ethyl (B): 69 mg/dL — ABNORMAL HIGH (ref 0–11)

## 2013-09-10 LAB — RAPID URINE DRUG SCREEN, HOSP PERFORMED
Amphetamines: NOT DETECTED
Barbiturates: NOT DETECTED
Benzodiazepines: NOT DETECTED
Cocaine: NOT DETECTED
Opiates: NOT DETECTED
Tetrahydrocannabinol: POSITIVE — AB

## 2013-09-10 MED ORDER — CITALOPRAM HYDROBROMIDE 40 MG PO TABS
40.0000 mg | ORAL_TABLET | Freq: Every day | ORAL | Status: DC
  Administered 2013-09-10 – 2013-09-16 (×7): 40 mg via ORAL
  Filled 2013-09-10: qty 14
  Filled 2013-09-10 (×3): qty 1
  Filled 2013-09-10: qty 2
  Filled 2013-09-10 (×5): qty 1

## 2013-09-10 MED ORDER — ABACAVIR SULFATE-LAMIVUDINE 600-300 MG PO TABS
1.0000 | ORAL_TABLET | Freq: Every day | ORAL | Status: DC
  Filled 2013-09-10 (×3): qty 1

## 2013-09-10 MED ORDER — ATOVAQUONE 750 MG/5ML PO SUSP
1500.0000 mg | Freq: Every day | ORAL | Status: DC
  Administered 2013-09-10 – 2013-09-16 (×7): 1500 mg via ORAL
  Filled 2013-09-10 (×9): qty 10
  Filled 2013-09-10: qty 30

## 2013-09-10 MED ORDER — ABACAVIR SULFATE-LAMIVUDINE 600-300 MG PO TABS
1.0000 | ORAL_TABLET | Freq: Every day | ORAL | Status: DC
  Filled 2013-09-10 (×2): qty 1

## 2013-09-10 MED ORDER — ONDANSETRON HCL 4 MG PO TABS: 4.0000 mg | ORAL_TABLET | Freq: Three times a day (TID) | ORAL | Status: DC | PRN

## 2013-09-10 MED ORDER — DARUNAVIR-COBICISTAT 800-150 MG PO TABS
1.0000 | ORAL_TABLET | Freq: Every day | ORAL | Status: DC
  Administered 2013-09-11 – 2013-09-16 (×6): 1 via ORAL
  Filled 2013-09-10 (×7): qty 1

## 2013-09-10 MED ORDER — OMEGA-3-ACID ETHYL ESTERS 1 G PO CAPS
1.0000 g | ORAL_CAPSULE | Freq: Every day | ORAL | Status: DC
  Administered 2013-09-10 – 2013-09-16 (×7): 1 g via ORAL
  Filled 2013-09-10 (×3): qty 1
  Filled 2013-09-10: qty 14
  Filled 2013-09-10 (×6): qty 1

## 2013-09-10 MED ORDER — ABACAVIR SULFATE 300 MG PO TABS
600.0000 mg | ORAL_TABLET | Freq: Every day | ORAL | Status: DC
  Administered 2013-09-10 – 2013-09-16 (×7): 600 mg via ORAL
  Filled 2013-09-10 (×3): qty 2
  Filled 2013-09-10: qty 6
  Filled 2013-09-10 (×6): qty 2

## 2013-09-10 MED ORDER — ZOLPIDEM TARTRATE 5 MG PO TABS: 5.0000 mg | ORAL_TABLET | Freq: Every evening | ORAL | Status: DC | PRN

## 2013-09-10 MED ORDER — NICOTINE 21 MG/24HR TD PT24
21.0000 mg | MEDICATED_PATCH | Freq: Every day | TRANSDERMAL | Status: DC
  Administered 2013-09-10: 21 mg via TRANSDERMAL
  Filled 2013-09-10: qty 1

## 2013-09-10 MED ORDER — ARIPIPRAZOLE 5 MG PO TABS
5.0000 mg | ORAL_TABLET | Freq: Every day | ORAL | Status: DC
  Administered 2013-09-10 – 2013-09-16 (×7): 5 mg via ORAL
  Filled 2013-09-10 (×6): qty 1
  Filled 2013-09-10: qty 14
  Filled 2013-09-10 (×3): qty 1

## 2013-09-10 MED ORDER — NICOTINE 21 MG/24HR TD PT24
21.0000 mg | MEDICATED_PATCH | Freq: Every day | TRANSDERMAL | Status: DC
  Administered 2013-09-11 – 2013-09-16 (×6): 21 mg via TRANSDERMAL
  Filled 2013-09-10 (×8): qty 1

## 2013-09-10 MED ORDER — PANTOPRAZOLE SODIUM 40 MG PO TBEC
40.0000 mg | DELAYED_RELEASE_TABLET | Freq: Every day | ORAL | Status: DC
  Administered 2013-09-10 – 2013-09-16 (×7): 40 mg via ORAL
  Filled 2013-09-10 (×3): qty 1
  Filled 2013-09-10: qty 14
  Filled 2013-09-10 (×6): qty 1

## 2013-09-10 MED ORDER — TRAZODONE HCL 50 MG PO TABS
50.0000 mg | ORAL_TABLET | Freq: Every day | ORAL | Status: DC
  Administered 2013-09-10 – 2013-09-15 (×6): 50 mg via ORAL
  Filled 2013-09-10: qty 14
  Filled 2013-09-10 (×7): qty 1

## 2013-09-10 MED ORDER — IBUPROFEN 200 MG PO TABS: 600.0000 mg | ORAL_TABLET | Freq: Three times a day (TID) | ORAL | Status: DC | PRN

## 2013-09-10 MED ORDER — ALUM & MAG HYDROXIDE-SIMETH 200-200-20 MG/5ML PO SUSP: 30.0000 mL | ORAL | Status: DC | PRN

## 2013-09-10 MED ORDER — LAMIVUDINE 150 MG PO TABS
300.0000 mg | ORAL_TABLET | Freq: Every day | ORAL | Status: DC
  Administered 2013-09-11 – 2013-09-16 (×6): 300 mg via ORAL
  Filled 2013-09-10 (×8): qty 2
  Filled 2013-09-10: qty 6
  Filled 2013-09-10: qty 2

## 2013-09-10 MED ORDER — LORAZEPAM 1 MG PO TABS
1.0000 mg | ORAL_TABLET | Freq: Three times a day (TID) | ORAL | Status: DC | PRN
  Administered 2013-09-10: 1 mg via ORAL
  Filled 2013-09-10: qty 1

## 2013-09-10 MED ORDER — GABAPENTIN 100 MG PO CAPS
100.0000 mg | ORAL_CAPSULE | Freq: Three times a day (TID) | ORAL | Status: DC
  Administered 2013-09-10 – 2013-09-16 (×17): 100 mg via ORAL
  Filled 2013-09-10: qty 42
  Filled 2013-09-10 (×11): qty 1
  Filled 2013-09-10: qty 42
  Filled 2013-09-10 (×7): qty 1
  Filled 2013-09-10: qty 42
  Filled 2013-09-10 (×3): qty 1

## 2013-09-10 MED ORDER — ADULT MULTIVITAMIN W/MINERALS CH
1.0000 | ORAL_TABLET | Freq: Every day | ORAL | Status: DC
  Administered 2013-09-10 – 2013-09-16 (×7): 1 via ORAL
  Filled 2013-09-10 (×9): qty 1

## 2013-09-10 MED ORDER — ACETAMINOPHEN 325 MG PO TABS: 650.0000 mg | ORAL_TABLET | Freq: Four times a day (QID) | ORAL | Status: DC | PRN

## 2013-09-10 MED ORDER — MAGNESIUM HYDROXIDE 400 MG/5ML PO SUSP: 30.0000 mL | Freq: Every day | ORAL | Status: DC | PRN

## 2013-09-10 NOTE — ED Notes (Signed)
Attempted to call report.  Receiving nurse to call back.

## 2013-09-10 NOTE — Tx Team (Signed)
Initial Interdisciplinary Treatment Plan  PATIENT STRENGTHS: (choose at least two) Capable of independent living Communication skills General fund of knowledge Supportive family/friends  PATIENT STRESSORS: Financial difficulties Health problems Legal issue Medication change or noncompliance Occupational concerns Substance abuse   PROBLEM LIST: Problem List/Patient Goals Date to be addressed Date deferred Reason deferred Estimated date of resolution  "I need to work on coping skills" 09/10/13     "I need a relapse prevention plan" 09/10/13     "I need help with my triggers" 09/10/13     Substance abuse 09/10/13     Homelessness                               DISCHARGE CRITERIA:  Adequate post-discharge living arrangements Improved stabilization in mood, thinking, and/or behavior Medical problems require only outpatient monitoring Motivation to continue treatment in a less acute level of care Need for constant or close observation no longer present Reduction of life-threatening or endangering symptoms to within safe limits Withdrawal symptoms are absent or subacute and managed without 24-hour nursing intervention  PRELIMINARY DISCHARGE PLAN: Attend 12-step recovery group Placement in alternative living arrangements  PATIENT/FAMIILY INVOLVEMENT: This treatment plan has been presented to and reviewed with the patient, [name].  The patient and family have been given the opportunity to ask questions and make suggestions.  Cresenciano GenreCaroline Evans Azam Gervasi 09/10/2013, 3:59 PM

## 2013-09-10 NOTE — ED Notes (Signed)
Pt has tried twice to obtain a urine sample, pt sts he needs more water, Clinical research associatewriter provided more water for pt.

## 2013-09-10 NOTE — ED Provider Notes (Signed)
Patient has been accepted [facility] the behavioral [facility] by Dr. Dub MikesLugo.  Rolan BuccoMelanie Latice Waitman, MD 09/10/13 1023

## 2013-09-10 NOTE — ED Provider Notes (Signed)
Medical screening examination/treatment/procedure(s) were performed by non-physician practitioner and as supervising physician I was immediately available for consultation/collaboration.   EKG Interpretation None       Derwood KaplanAnkit Yelena Metzer, MD 09/10/13 [phone]
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
## 2013-09-10 NOTE — Progress Notes (Signed)
Pt accepted to [facility] to Dr. Dub MikesLugo. Pt to be transported voluntarily by [employer]. RN can call [facility].   Byrd HesselbachKristen Allyse Fregeau, LCSW [phone]  ED CSW 09/10/2013 956am

## 2013-09-10 NOTE — ED Notes (Signed)
Writer gave pt a cup of water and a Malaw[place]urkey sandwich

## 2013-09-10 NOTE — Progress Notes (Signed)
Psychoeducational Group Note  Date:  09/10/2013 Time:  8:00 p.m.  Group Topic/Focus:  Wrap-Up Group:   The focus of this group is to help patients review their daily goal of treatment and discuss progress on daily workbooks.  Participation Level: Did Not Attend  Participation Quality:  Not Applicable  Affect:  Not Applicable  Cognitive:  Not Applicable  Insight:  Not Applicable  Engagement in Group: Not Applicable  Additional Comments:  The patient did not attend group since he was asleep in his bed.   Westly PamBenjamin S Jennica Tagliaferri 09/10/2013, 10:50 PM
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
## 2013-09-10 NOTE — ED Notes (Signed)
Patient changed into blue scrubs.
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
## 2013-09-10 NOTE — Progress Notes (Signed)
Recreation Therapy Notes  Animal-Assisted Activity/Therapy (AAA/T) Program Checklist/Progress Notes Patient Eligibility Criteria Checklist & Daily Group note for Rec Tx Intervention  Date: 04.21.2015 Time: 2:45pm Location: 500 Hall Dayroom    AAA/T Program Assumption of Risk Form signed by Patient/ or Parent Legal Guardian yes  Patient is free of allergies or sever asthma yes  Patient reports no fear of animals yes  Patient reports no history of cruelty to animals yes   Patient understands his/her participation is voluntary yes  Behavioral Response: Did not attend.   Tanis Hensarling L Johndavid Geralds, LRT/CTRS         Alcus Bradly L Deshonda Cryderman 09/10/2013 4:31 PM 

## 2013-09-10 NOTE — ED Notes (Signed)
[facility]. Should have discharges on 300 hall today.

## 2013-09-10 NOTE — ED Notes (Signed)
[facility]
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
## 2013-09-10 NOTE — ED Notes (Signed)
Provided pt with Meal Tray

## 2013-09-11 DIAGNOSIS — F1020 Alcohol dependence, uncomplicated: Principal | ICD-10-CM

## 2013-09-11 DIAGNOSIS — F259 Schizoaffective disorder, unspecified: Secondary | ICD-10-CM

## 2013-09-11 NOTE — Clinical Social Work Note (Signed)
CSW provided [place] with [facility] [place] info, phone numbers and application (for [facility]).  CSW will monitor these referrals.    Johnathan IvanChelsea Horton, LCSW 09/11/2013  11:05 AM

## 2013-09-11 NOTE — Progress Notes (Signed)
Patient ID: Earnestine LeysRupert D Liwanag, male   DOB: 10/10/1955, 58 y.o.   MRN: [id number]   D: After the introduction writer attempted to speak with the pt. Pt was laying in bed and didn't easily engage in conversation with the Clinical research associatewriter. Writer asked pt about the circumstances surrounding his adm. Pt stated "I just need some help". Stated he completed [facility] and was there from 04/25/13 to 06/25/13. [facility]  A:  Support and encouragement was offered. 15 min checks continued for safety.  R: Pt remains safe.

## 2013-09-11 NOTE — [facility] Group Notes (Signed)
[facility] LCSW Group Therapy  09/11/2013  1:15 PM   Type of Therapy:  Group Therapy  Participation Level:  Active  Participation Quality:  Attentive, Sharing and Supportive  Affect:  Depressed and Flat  Cognitive:  Alert and Oriented  Insight:  Developing/Improving and Engaged  Engagement in Therapy:  Developing/Improving and Engaged  Modes of Intervention:  Clarification, Confrontation, Discussion, Education, Exploration, Limit-setting, Orientation, Problem-solving, Rapport Building, Dance movement psychotherapisteality Testing, Socialization and Support  Summary of Progress/Problems: The topic for group today was emotional regulation.  This group focused on both positive and negative emotion identification and allowed group members to process ways to identify feelings, regulate negative emotions, and find healthy ways to manage internal/external emotions. Group members were asked to reflect on a time when their reaction to an emotion led to a negative outcome and explored how alternative responses using emotion regulation would have benefited them. Group members were also asked to discuss a time when emotion regulation was utilized when a negative emotion was experienced. Pt shared that he drinks to cope with his emotions but has to learn new ways to deal with negative emotions, as he continues to relapse.  Pt actively participated and was engaged in group discussion.    Johnathan IvanChelsea Horton, LCSW 09/11/2013  2:18 PM

## 2013-09-11 NOTE — Progress Notes (Signed)
NUTRITION ASSESSMENT  Pt identified as at risk on the Malnutrition Screen Tool  INTERVENTION: 1. Educated patient on the importance of nutrition and encouraged intake of food and beverages. 2. Discussed weight goals. 3. Supplements: MVI and omega 3 daily.  NUTRITION DIAGNOSIS: Unintentional weight loss related to sub-optimal intake as evidenced by pt report.   Goal: Pt to meet >/= 90% of their estimated nutrition needs.  Monitor:  PO intake  Assessment:  Patient admitted with etoh abuse and depression.  Homeless.  States that his appetite is good and is eating well here.  Poor-fair appetite and intake prior to admit.  "I would rather drink."  UBW 238 lbs but appears most recent UBW was 225 lbs 1 month ago.  Patient has lost 4% weight in the past month.    58 y.o. male  Height: Ht Readings from Last 1 Encounters:  09/10/13 5' 11.5" (1.816 m)    Weight: Wt Readings from Last 1 Encounters:  09/10/13 214 lb (97.07 kg)    Weight Hx: Wt Readings from Last 10 Encounters:  09/10/13 214 lb (97.07 kg)  08/08/13 225 lb (102.059 kg)  07/11/13 226 lb (102.513 kg)  08/02/12 230 lb (104.327 kg)  03/14/12 228 lb (103.42 kg)  01/30/12 230 lb (104.327 kg)  11/23/11 224 lb (101.606 kg)  09/21/11 211 lb (95.709 kg)  05/05/09 206 lb (93.441 kg)  07/08/08 210 lb (95.255 kg)    BMI:  Body mass index is 29.43 kg/(m^2). Pt meets criteria for overweight based on current BMI.  Estimated Nutritional Needs: Kcal: 25-30 kcal/kg Protein: > 1 gram protein/kg Fluid: 1 ml/kcal  Diet Order: General Pt is also offered choice of unit snacks mid-morning and mid-afternoon.  Pt is eating as desired.   Lab results and medications reviewed.   Oran ReinLaura Jobe, RD, LDN Clinical Inpatient Dietitian Pager:  [phone] Weekend and after hours pager:  [phone]

## 2013-09-11 NOTE — Progress Notes (Signed)
D: Patient in his room on approach.  Patient states she had a good day.  Patient states he is hoping to get housing and set up with caring services.  Patient states he is going to continue to communicate with his Child psychotherapistsocial worker.  Patient states he is learning to be patient and states, "It is up to me to do better." Patient denies SI/HI and denies AVH.   A: Staff to monitor Q 15 mins for safety.  Encouragement and support offered.  Scheduled medications administered per orders. R: Patient remains safe on the unit.  Patient attended group tonight.  Patient visible on the unit and interacting with peers.  Patient taking administered medications.

## 2013-09-11 NOTE — [facility] Suicide Risk Assessment (Signed)
Suicide Risk Assessment  Admission Assessment     Nursing information obtained from:    Demographic factors:    Current Mental Status:    Loss Factors:    Historical Factors:    Risk Reduction Factors:    Total Time spent with patient: 45 minutes  CLINICAL FACTORS:   Alcohol/Substance Abuse/Dependencies Medical Diagnoses and Treatments/Surgeries  Psychiatric Specialty Exam:     Blood pressure 107/74, pulse 75, temperature 97.7 F (36.5 C), temperature source Oral, resp. rate 18, height 5' 11.5" (1.816 m), weight 97.07 kg (214 lb).Body mass index is 29.43 kg/(m^2).  General Appearance: Fairly Groomed  Patent attorneyye Contact::  Fair  Speech:  Clear and Coherent and Slow  Volume:  Decreased  Mood:  Anxious and Depressed  Affect:  Restricted  Thought Process:  Coherent and Goal Directed  Orientation:  Full (Time, Place, and Person)  Thought Content:  symptoms, worries, concerns  Suicidal Thoughts:  No  Homicidal Thoughts:  No  Memory:  Immediate;   Fair Recent;   Fair Remote;   Fair  Judgement:  Fair  Insight:  Shallow  Psychomotor Activity:  Decreased  Concentration:  Fair  Recall:  Fis[employer]Fair  Fund of Knowledge:NA  Language: Fair  Akathisia:  No  Handed:    AIMS (if indicated):     Assets:  Desire for Improvement  Sleep:  Number of Hours: 6.5   Musculoskeletal: Strength & Muscle Tone: within normal limits Gait & Station: normal Patient leans: N/A  COGNITIVE FEATURES THAT CONTRIBUTE TO RISK:  Closed-mindedness Polarized thinking Thought constriction (tunnel vision)    SUICIDE RISK:   Moderate:  Frequent suicidal ideation with limited intensity, and duration, some specificity in terms of plans, no associated intent, good self-control, limited dysphoria/symptomatology, some risk factors present, and identifiable protective factors, including available and accessible social support.  PLAN OF CARE: Supportive approach/coping skills/relapse prevention  Detox as needed                              Optimize treatment with psychotropics  I certify that inpatient services furnished can reasonably be expected to improve the patient's condition.  Rachael Feerving A Abby Tucholski 09/11/2013, 6:00 PM

## 2013-09-11 NOTE — Clinical Social Work Note (Signed)
CSW left a voicemail message for pt's attorney, Colvin CaroliDouglas Wink [phone]), informing her that pt is in the hospital and admitted on 4/21, per pt's request.   Reyes Ivanhelsea Horton, LCSW 09/11/2013  2:34 PM

## 2013-09-11 NOTE — [facility] Group Notes (Signed)
[facility]   09/11/2013 8:45 AM  Participation Quality:  Alert, Appropriate and Oriented  Mood/Affect:  Flat and Depressed  Depression Rating:  3  Anxiety Rating:  3  Thoughts of Suicide:  Pt denies SI/HI  Will you contract for safety?   Yes  Current AVH:  Pt denies  Plan for Discharge/Comments:  Pt attended discharge planning group and actively participated in group.  CSW provided pt with today's workbook.  Pt reports coming here for alcohol detox.  Pt states that he was [facility], d/c from there in February 2015.  Pt wants to go for further inpatient treatment.  CSW will assess for appropriate referrals.  [place] homeless in [place]. No further needs voiced by pt at this time.      Transportation Means: Pt reports access to transportation  Supports: No supports mentioned at this time  Reyes IvanChelsea Horton, LCSW 09/11/2013 9:55 AM

## 2013-09-11 NOTE — BHH Counselor (Signed)
Adult Psychosocial Assessment Update Interdisciplinary Team  Previous [facility] admissions/discharges:  Admissions Discharges  Date: 04/26/13 Date: 05/06/13  Date: Date:  Date: Date:  Date: Date:  Date: Date:   Changes since the last Psychosocial Assessment (including adherence to outpatient mental health and/or substance abuse treatment, situational issues contributing to decompensation and/or relapse). Pt states that he went [facility] on [facility], straight from here for further inpatient treatment, and stayed there until February.  Pt states that he was able to stay sober until March but relapsed.  Pt explained that he had a wine cooler, went by the college and drank some more before being found in the road lying drunk.  Pt states that he was also arrested for being disorderly.  Pt states that he is now homeless, as he was staying with a friend but won't return there.  Pt wants further inpatient treatment.             Discharge Plan 1. Will you be returning to the same living situation after discharge?   Yes:  No:  X    If no, what is your plan? Pt is homeless and wants to go for further inpatient treatment.             2. Would you like a referral for services when you are discharged? Yes:  X   If yes, for what services? Pt wants referrals for further inpatient treatment.  CSW will assess for appropriate referrals.    No:              Summary and Recommendations (to be completed by the evaluator) Patient is a 58 year old African American Male with a diagnosis of Alcohol use disorder, Severe; Cannabis use disorder, Moderate.  Patient [place] homeless in [place].  Patient will benefit from crisis stabilization, medication evaluation, group therapy and psycho education in addition to case management for discharge planning.                         Signature:  Hoyle SauerChelsea N Horton, 09/11/2013 8:08 AM

## 2013-09-11 NOTE — B[facility] Suicide Risk Assessment (Signed)
B[facility] INPATIENT: Family/Significant Other Suicide Prevention Education   Suicide Prevention Education:  Education Completed; No one has been identified by the patient as the family member/significant other with whom the patient will be residing, and identified as the person(s) who will aid the patient in the event of a mental health crisis (suicidal ideations/suicide attempt).   Pt did not c/o SI at admission, nor have they endorsed SI during their stay here. SPE not required. SPI pamphlet provided to pt and he was encouraged to share information with his support network, ask questions, and talk about any concerns.   The suicide prevention education provided includes the following:  Suicide risk factors  Suicide prevention and interventions  National Suicide Hotline telephone number  [facility] assessment telephone number  [facility] Emergency Assistance 911  [facility] and/or Residential Mobile Crisis Unit telephone number  Reyes IvanChelsea Horton, [place] 09/11/2013  9:17 AM

## 2013-09-11 NOTE — Progress Notes (Signed)
Pt has been up and active in the milieu today. He rated all his depression, hopelessness and anxiety a 3 on his self-inventory.  He denies any S/H ideation or A/V/H.  [facility]  Pt has not required any prn medications today.
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
## 2013-09-12 NOTE — Progress Notes (Signed)
[facility]  09/12/2013 5:40 PM [name]  MRN:  [id number] Subjective:  Johnathan LarocheRupert is trying to get his life back together. There will be a [facility] available for him Monday AM [facility]. He is committed to abstinence. He will like to go to [facility] afterwards. He is concerned that every time he relapses, he affects his other medical conditions Diagnosis:   DSM5: Schizophrenia Disorders:  none Obsessive-Compulsive Disorders:  none Trauma-Stressor Disorders:  none Substance/Addictive Disorders:  Alcohol Related Disorder - Severe (303.90) Depressive Disorders:  Major Depressive Disorder - Moderate (296.22) Total Time spent with patient: 30 minutes  Axis I: Schizoaffective Disorder  ADL's:  Intact  Sleep: Fair  Appetite:  Fair  Suicidal Ideation:  Plan:  denies Intent:  denies Means:  denies Homicidal Ideation:  Plan:  denies Intent:  denies Means:  denies AEB (as evidenced by):  Psychiatric Specialty Exam: Physical Exam  Review of Systems  Constitutional: Negative.   HENT: Negative.   Eyes: Negative.   Respiratory: Negative.   Cardiovascular: Negative.   Gastrointestinal: Negative.   Genitourinary: Negative.   Musculoskeletal: Negative.   Skin: Negative.   Neurological: Negative.   Endo/Heme/Allergies: Negative.   Psychiatric/Behavioral: Positive for substance abuse. The patient is nervous/anxious.     Blood pressure 113/79, pulse 73, temperature 97.8 F (36.6 C), temperature source Oral, resp. rate 20, height 5' 11.5" (1.816 m), weight 97.07 kg (214 lb).Body mass index is 29.43 kg/(m^2).  General Appearance: Fairly Groomed  Patent attorneyye Contact::  Fair  Speech:  Clear and Coherent and not spontaneous  Volume:  Decreased  Mood:  worried  Affect:  Restricted  Thought Process:  Coherent and Goal Directed  Orientation:  Full (Time, Place, and Person)  Thought Content:  worries, concerns  Suicidal Thoughts:  No  Homicidal Thoughts:  No  Memory:  Immediate;    Fair Recent;   Fair Remote;   Fair  Judgement:  Fair  Insight:  Present and Shallow  Psychomotor Activity:  Decreased  Concentration:  Fair  Recall:  FiservFair  Fund of Knowledge:NA  Language: Fair  Akathisia:  No  Handed:    AIMS (if indicated):     Assets:  Desire for Improvement  Sleep:  Number of Hours: 6.25   Musculoskeletal: Strength & Muscle Tone: within normal limits Gait & Station: normal Patient leans: N/A  Current Medications: Current Facility-Administered Medications  Medication Dose Route Frequency Provider Last Rate Last Dose  . abacavir (ZIAGEN) tablet 600 mg  600 mg Oral Daily Rachael FeeIrving A Kharlie Bring, MD   600 mg at 09/12/13 0802   And  . lamiVUDine (EPIVIR) tablet 300 mg  300 mg Oral Daily Rachael FeeIrving A Varick Keys, MD   300 mg at 09/12/13 0802  . acetaminophen (TYLENOL) tablet 650 mg  650 mg Oral Q6H PRN Sanjuana KavaAgnes I Nwoko, NP      . alum & mag hydroxide-simeth (MAALOX/MYLANTA) 200-200-20 MG/5ML suspension 30 mL  30 mL Oral Q4H PRN Sanjuana KavaAgnes I Nwoko, NP      . ARIPiprazole (ABILIFY) tablet 5 mg  5 mg Oral Daily Sanjuana KavaAgnes I Nwoko, NP   5 mg at 09/12/13 0802  . atovaquone (MEPRON) 750 MG/5ML suspension 1,500 mg  1,500 mg Oral Daily Sanjuana KavaAgnes I Nwoko, NP   1,500 mg at 09/12/13 0802  . citalopram (CELEXA) tablet 40 mg  40 mg Oral Daily Sanjuana KavaAgnes I Nwoko, NP   40 mg at 09/12/13 0802  . darunavir-cobicistat (PREZCOBIX) 800-150 MG per tablet 1 tablet  1  tablet Oral Q breakfast Sanjuana KavaAgnes I Nwoko, NP   1 tablet at 09/12/13 0802  . gabapentin (NEURONTIN) capsule 100 mg  100 mg Oral TID Sanjuana KavaAgnes I Nwoko, NP   100 mg at 09/12/13 1705  . magnesium hydroxide (MILK OF MAGNESIA) suspension 30 mL  30 mL Oral Daily PRN Sanjuana KavaAgnes I Nwoko, NP      . multivitamin with minerals tablet 1 tablet  1 tablet Oral Daily Sanjuana KavaAgnes I Nwoko, NP   1 tablet at 09/12/13 0802  . nicotine (NICODERM CQ - dosed in mg/24 hours) patch 21 mg  21 mg Transdermal Q0600 Sanjuana KavaAgnes I Nwoko, NP   21 mg at 09/12/13 0802  . omega-3 acid ethyl esters (LOVAZA) capsule 1 g  1 g  Oral Daily Sanjuana KavaAgnes I Nwoko, NP   1 g at 09/12/13 0802  . pantoprazole (PROTONIX) EC tablet 40 mg  40 mg Oral Daily Sanjuana KavaAgnes I Nwoko, NP   40 mg at 09/12/13 0802  . traZODone (DESYREL) tablet 50 mg  50 mg Oral QHS Sanjuana KavaAgnes I Nwoko, NP   50 mg at 09/11/13 2150    Lab Results: No results found for this or any previous visit (from the past 48 hour(s)).  Physical Findings: AIMS: Facial and Oral Movements Muscles of Facial Expression: None, normal Lips and Perioral Area: None, normal Jaw: None, normal Tongue: None, normal,Extremity Movements Upper (arms, wrists, hands, fingers): None, normal Lower (legs, knees, ankles, toes): None, normal, Trunk Movements Neck, shoulders, hips: None, normal, Overall Severity Severity of abnormal movements (highest score from questions above): None, normal Incapacitation due to abnormal movements: None, normal Patient's awareness of abnormal movements (rate only patient's report): No Awareness, Dental Status Current problems with teeth and/or dentures?: Yes Does patient usually wear dentures?: No (appt. to get dentures)  CIWA:    COWS:     Treatment Plan Summary: Daily contact with patient to assess and evaluate symptoms and progress in treatment Medication management  Plan: Supportive approach/coping skills/relapse prevention           Reassess and address the co morbidities           [facility]  Medical Decision Making Problem Points:  Review of psycho-social stressors (1) Data Points:  Review of medication regiment & side effects (2)  I certify that inpatient services furnished can reasonably be expected to improve the patient's condition.   Rachael Feerving A [name] 09/12/2013, 5:40 PM

## 2013-09-12 NOTE — [facility] Group Notes (Signed)
[facility] LCSW Group Therapy  09/12/2013 1:15 PM   Type of Therapy:  Group Therapy  Participation Level:  Did Not Attend - pt sleeping in his room  Ileane Sando Horton, LCSW 09/12/2013 2:51 PM   

## 2013-09-12 NOTE — Progress Notes (Signed)
Patient ID: [name], male   DOB: 02/04/1956, 58 y.o.   MRN: [id number] D: Patient states that [facility] is [facility].  He is committed to his sobriety.  Patient did say that if his mother became sick and needed him, he would need to leave the treatment facility.  Patient states his mother is elderly and he has to look after her.  Educated patient on the importance on looking after himself first so he could take care of her.  Patient denies any SI/HI/AVH.  He remains with flat, blunted affect.  He reports decreased depressive symptoms.  He reports a high level of anxiety.  A: continue to monitor medication management and MD orders.  Safety checks completed every 15 minutes per protocol.  R: patient is receptive to staff and his behavior is appropriate.

## 2013-09-12 NOTE — Progress Notes (Signed)
Pt has been up and active in the milieu. He rated all his depression, hopelessness and anxiety a 2 on his self-inventory. He denies any S/H ideation or A/V/H. He plans to f/u with [facility]. No prn meds thus far.

## 2013-09-12 NOTE — Progress Notes (Signed)
0900 nursing orientation group   The focus of this group is to educate the patient on the purpose and policies of crisis stabilization and provide a format to answer questions about their admission.  The group details unit policies and expectations of patients while admitted.   Pt was an active participant his goal today"stay focused on my recovery and relaxing makes me happy"

## 2013-09-12 NOTE — Progress Notes (Signed)
Patient did attend the evening karaoke group. Pt was engaged, supportive, and participated by singing a song.  

## 2013-09-13 ENCOUNTER — Other Ambulatory Visit: Payer: Self-pay | Admitting: Infectious Disease

## 2013-09-13 ENCOUNTER — Telehealth: Payer: Self-pay | Admitting: Licensed Clinical Social Worker

## 2013-09-13 NOTE — [facility] Group Notes (Signed)
[facility] LCSW Group Therapy  09/13/2013  1:15 PM   Type of Therapy:  Group Therapy  Participation Level:  Active  Participation Quality:  Attentive, Sharing and Supportive  Affect:  Depressed and Flat  Cognitive:  Alert and Oriented  Insight:  Developing/Improving and Engaged  Engagement in Therapy:  Developing/Improving and Engaged  Modes of Intervention:  Clarification, Confrontation, Discussion, Education, Exploration, Limit-setting, Orientation, Problem-solving, Rapport Building, Dance movement psychotherapisteality Testing, Socialization and Support  Summary of Progress/Problems: Group today consisted of processing and education around the stages of change: precontemplation, contemplation, preparation, Action, Maintenance, and relapse. Group first was educated about the different stages within the cycle and then processed barriers to change along with triggers associated with relapse.  Pt was present for the beginning of group but left and didn't return.    Johnathan IvanChelsea Horton, LCSW 09/13/2013  2:20 PM

## 2013-09-13 NOTE — [facility] Group Notes (Signed)
[facility]   09/13/2013 8:45 AM  Participation Quality:  Alert, Appropriate and Oriented  Mood/Affect:  Bright  Depression Rating:  0  Anxiety Rating:  0  Thoughts of Suicide:  Pt denies SI/HI  Will you contract for safety?   Yes  Current AVH:  Pt denies  Plan for Discharge/Comments:  Pt attended discharge planning group and actively participated in group.  CSW provided pt with today's workbook.  Pt reports feeling well today and is excited about going [facility] further [facility] treatment on Monday.  [place] after [place].  No further needs voiced by pt at this time.    Transportation Means: Pt reports access to transportation - [facility]  Supports: No supports mentioned at this time  Reyes IvanChelsea Horton, LCSW 09/13/2013 9:34 AM

## 2013-09-13 NOTE — Tx Team (Signed)
Interdisciplinary Treatment Plan Update (Adult)  Date: 09/13/2013  Time Reviewed:  9:45 AM  Progress in Treatment: Attending groups: Yes Participating in groups:  Yes Taking medication as prescribed:  Yes Tolerating medication:  Yes Family/Significant othe contact made: No, N/A Patient understands diagnosis:  Yes Discussing patient identified problems/goals with staff:  Yes Medical problems stabilized or resolved:  Yes Denies suicidal/homicidal ideation: Yes Issues/concerns per patient self-inventory:  Yes Other:  New problem(s) identified: N/A  Discharge Plan or Barriers: Pt is scheduled [facility] treatment.  Pt also has [facility] medication management and therapy.    Reason for Continuation of Hospitalization: Anxiety Depression Medication Stabilization  Comments: N/A  Estimated length of stay: 3 days, [facility]  For review of initial/current patient goals, please see plan of care.  Attendees: Patient:     Family:     Physician:  Dr. Dub MikesLugo 09/13/2013 12:06 PM   Nursing:   Lamount Crankerhris Judge, RN 09/13/2013 12:06 PM   Clinical Social Worker:  Reyes Ivanhelsea Horton, LCSW 09/13/2013 12:06 PM   Other: Onnie BoerJennifer Clark, RN case manager 09/13/2013 12:06 PM   Other:  Shelda JakesPatty Duke, RN 09/13/2013 12:06 PM   Other:  Serena ColonelAggie Nwoko, NP 09/13/2013 12:06 PM   Other:     Other:    Other:    Other:    Other:    Other:    Other:     Scribe for Treatment Team:   Carmina MillerHorton, Laporsche Hoeger Nicole, 09/13/2013 , 12:06 PM

## 2013-09-13 NOTE — Progress Notes (Signed)
Pt observed resting in bed with eyes closed. No acute distress. RR WNL. Level III obs in place for safety and pt remains safe. [name] [name]  

## 2013-09-13 NOTE — Progress Notes (Signed)
D) Pt slept a good part of the morning. Did attend the groups and interacts with his peers. Affect is bright with interaction. Denies SI and HI and rates his depression and his hopelessness both at a 1.  A) Given support, reassurance and praise. Encouragement given. R) Denies SI and HI.

## 2013-09-13 NOTE — Telephone Encounter (Signed)
Patient is requesting Viagra refill, will send to Provider for approval. Please advise

## 2013-09-13 NOTE — Progress Notes (Signed)
(  Discharging early Monday morning) [facility] :  Will you be returning to the same living situation after discharge: Yes,  can return home after completing treatment At discharge, do you have transportation home?:Yes,  Pt [facility] Do you have the ability to pay for your medications:Yes,  provided pt with samples and prescriptions and [facility] will assist with med  Release of information consent forms completed and in the chart;  Patient's signature needed at discharge.  Patient to Follow up at: Follow-up Information   [facility]. (Arrive at 8:00 am promptly for screening and possible admission for further inpatient treatment. Bring supply of meds, photo ID and belongings. )    Contact information:   [address] [place], [place] [address] Phone: [phone] Fax: [phone]      Follow up with [facility]. (Walk in on this date for hospital discharge appointment. Walk in clinic is Monday - Friday 8 am - 3 pm. They will than schedule you for medication management and therapy appts. )    Contact information:   [address] [place], [place] [address] Phone: [phone] Fax: [phone]      Patient denies SI/HI:   Yes,  denies SI/HI    Safety Planning and Suicide Prevention discussed:  Yes,  discussed with pt.  N/A to contact family/friend due to no SI on admission.  See suicide prevention education note.   Floraine Buechler N Horton 09/13/2013, 2:55 PM

## 2013-09-13 NOTE — Progress Notes (Signed)
Patient ID: Johnathan LeysRupert D Lizardo, male   DOB: 03/31/1956, [age] y.o.   MRN: [id number] [facility]  09/13/2013 12:07 PM [name]  MRN:  [id number]  Subjective:  [name] says he is doing much better. Says he is getting himself ready for a discharge to go [facility] further substance abuse treatment. He adds that he is ready to make a change in his life, make better decisions and use better judgement. He states when he gets out of rehab, he will treat himself by buying a new tennis shoes using the money he would have spent on drugs. He denies any SIHI, AVH.  Diagnosis:   DSM5: Schizophrenia Disorders:  none Obsessive-Compulsive Disorders:  none Trauma-Stressor Disorders:  none Substance/Addictive Disorders:  Alcohol Related Disorder - Severe (303.90) Depressive Disorders:  Major Depressive Disorder - Moderate (296.22) Total Time spent with patient: 30 minutes  Axis I: Schizoaffective Disorder  ADL's:  Intact  Sleep: Fair  Appetite:  Fair  Suicidal Ideation:  Plan:  denies Intent:  denies Means:  denies Homicidal Ideation:  Plan:  denies Intent:  denies Means:  denies  AEB (as evidenced by):  Psychiatric Specialty Exam: Physical Exam  Psychiatric: His speech is normal and behavior is normal. Judgment and thought content normal. His mood appears not anxious. His affect is not angry, not blunt, not labile and not inappropriate. Cognition and memory are normal. He does not exhibit a depressed mood.    Review of Systems  Constitutional: Negative.   HENT: Negative.   Eyes: Negative.   Respiratory: Negative.   Cardiovascular: Negative.   Gastrointestinal: Negative.   Genitourinary: Negative.   Musculoskeletal: Negative.   Skin: Negative.   Neurological: Negative.   Endo/Heme/Allergies: Negative.   Psychiatric/Behavioral: Positive for substance abuse. The patient is nervous/anxious.     Blood pressure 123/76, pulse 73, temperature 97.9 F (36.6  C), temperature source Oral, resp. rate 24, height 5' 11.5" (1.816 m), weight 97.07 kg (214 lb).Body mass index is 29.43 kg/(m^2).  General Appearance: Fairly Groomed  Patent attorneyye Contact::  Fair  Speech:  Clear and Coherent and not spontaneous  Volume:  Decreased  Mood:  Hopeful  Affect:  Appropriate and Congruent  Thought Process:  Coherent and Goal Directed  Orientation:  Full (Time, Place, and Person)  Thought Content:  Denies any hallucinations  Suicidal Thoughts:  No  Homicidal Thoughts:  No  Memory:  Immediate;   Fair Recent;   Fair Remote;   Fair  Judgement:  Fair  Insight:  Present and Shallow  Psychomotor Activity:  Decreased  Concentration:  Fair  Recall:  FiservFair  Fund of Knowledge:NA  Language: Fair  Akathisia:  No  Handed:    AIMS (if indicated):     Assets:  Desire for Improvement  Sleep:  Number of Hours: 6.25   Musculoskeletal: Strength & Muscle Tone: within normal limits Gait & Station: normal Patient leans: N/A  Current Medications: Current Facility-Administered Medications  Medication Dose Route Frequency Provider Last Rate Last Dose  . abacavir (ZIAGEN) tablet 600 mg  600 mg Oral Daily Rachael FeeIrving A Shanik [place], MD   600 mg at 09/13/13 [id number]   And  . lamiVUDine (EPIVIR) tablet 300 mg  300 mg Oral Daily Rachael FeeIrving A Haruo Step[place]k, MD   300 mg at 09/13/13 0836  . acetaminophen (TYLENOL) tablet 650 mg  650 mg Oral Q6H PRN Sanjuana KavaAgnes I Nwoko, NP      . alum & mag hydroxide-simeth (MAALOX/MYLANTA) 200-200-20 MG/5ML suspension  30 mL  30 mL Oral Q4H PRN Sanjuana KavaAgnes I Nwoko, NP      . ARIPiprazole (ABILIFY) tablet 5 mg  5 mg Oral Daily Sanjuana KavaAgnes I Nwoko, NP   5 mg at 09/13/13 0837  . atovaquone (MEPRON) 750 MG/5ML suspension 1,500 mg  1,500 mg Oral Daily Sanjuana KavaAgnes I Nwoko, NP   1,500 mg at 09/13/13 0836  . citalopram (CELEXA) tablet 40 mg  40 mg Oral Daily Sanjuana KavaAgnes I Nwoko, NP   40 mg at 09/13/13 0836  . darunavir-cobicistat (PREZCOBIX) 800-150 MG per tablet 1 tablet  1 tablet Oral Q breakfast Sanjuana KavaAgnes I Nwoko, NP   1  tablet at 09/13/13 [phone] [id number]  . gabapentin (NEURONTIN) capsule 100 mg  100 mg Oral TID Sanjuana KavaAgnes I Nwoko, NP   100 mg at 09/13/13 0836  . magnesium hydroxide (MILK OF MAGNESIA) suspension 30 mL  30 mL Oral Daily PRN Sanjuana KavaAgnes I Nwoko, NP      . multivitamin with minerals tablet 1 tablet  1 tablet Oral Daily Sanjuana KavaAgnes I Nwoko, NP   1 tablet at 09/13/13 0836  . nicotine (NICODERM CQ - dosed in mg/24 hours) patch 21 mg  21 mg Transdermal Q0600 Sanjuana KavaAgnes I Nwoko, NP   21 mg at 09/13/13 0848  . omega-3 acid ethyl esters (LOVAZA) capsule 1 g  1 g Oral Daily Sanjuana KavaAgnes I Nwoko, NP   1 g at 09/13/13 0836  . pantoprazole (PROTONIX) EC tablet 40 mg  40 mg Oral Daily Sanjuana KavaAgnes I Nwoko, NP   40 mg at 09/13/13 0837  . traZODone (DESYREL) tablet 50 mg  50 mg Oral QHS Sanjuana KavaAgnes I Nwoko, NP   50 mg at 09/12/13 2144    Lab Results: No results found for this or any previous visit (from the past 48 hour(s)).  Physical Findings: AIMS: Facial and Oral Movements Muscles of Facial Expression: None, normal Lips and Perioral Area: None, normal Jaw: None, normal Tongue: None, normal,Extremity Movements Upper (arms, wrists, hands, fingers): None, normal Lower (legs, knees, ankles, toes): None, normal, Trunk Movements Neck, shoulders, hips: None, normal, Overall Severity Severity of abnormal movements (highest score from questions above): None, normal Incapacitation due to abnormal movements: None, normal Patient's awareness of abnormal movements (rate only patient's report): No Awareness, Dental Status Current problems with teeth and/or dentures?: No Does patient usually wear dentures?: No  CIWA:    COWS:     Treatment Plan Summary: Daily contact with patient to assess and evaluate symptoms and progress in treatment Medication management  Plan: Supportive approach/coping skills/relapse prevention Reassess and address the co morbidities [facility]. Discharge plans in progress.  Medical Decision  Making Problem Points:  Review of psycho-social stressors (1) Data Points:  Review of medication regiment & side effects (2)  I certify that inpatient services furnished can reasonably be expected to improve the patient's condition.   Sanjuana KavaAgnes I Nwoko, PMHNP, FNP-BC 09/13/2013, 12:07 PM Personally evaluated the patient and agree with assessment and plan Madie RenoIrving A. Dub MikesLugo, M.D.

## 2013-09-14 DIAGNOSIS — F329 Major depressive disorder, single episode, unspecified: Secondary | ICD-10-CM

## 2013-09-14 NOTE — Progress Notes (Signed)
D) Pt has been attending the program and interacting with his peers appropriately. Rates his depression and hopelessness both at a 1 and denies SI and HI. Is looking [facility]. States he is feeling much better overall. Affect and mood are brighter A) Given support, reassurance and praise. Encouragement given. Provided with a 1:1. R) Pt denies SI and HI.

## 2013-09-14 NOTE — Progress Notes (Addendum)
D  Pt in bed resting most of the shift    He is sad and depressed looking but is cooperative and appropriate    He tends to isolate and did not go to group this evening   Pt did get up and make it to part of the AA group this evening   [facility] and sounded very positive about same A   Verbal support given   Medications administered and effectiveness monitored   Q 15 min checks R    Pt safe at present

## 2013-09-14 NOTE — Progress Notes (Signed)
Patient ID: [name], male   DOB: 05/14/1956, 58 y.o.   MRN: [id number] [facility] Progress Note  09/14/2013 1:45 PM [name]  MRN:  [id number]  Subjective:  Mr [name] is sleeping in bed at time of assessment, he tells me this is the first group he has missed and it it related to a headache today.  He says is is better and doing well and  ready for a discharge to [facility] for further substance abuse treatment. He plans to ask his sister (who is a Warden/rangersychologist here in town) for her help in manageing his money as a way of avoiding the temptation of using when he gets his check.  He is hopeful for a better future  He rates his depression at 0/10 and Anxiety 0/10 He denies any SIHI, AVH.  Diagnosis:   DSM5: Schizophrenia Disorders:  none Obsessive-Compulsive Disorders:  none Trauma-Stressor Disorders:  none Substance/Addictive Disorders:  Alcohol Related Disorder - Severe (303.90) Depressive Disorders:  Major Depressive Disorder - Moderate (296.22) Total Time spent with patient: 25 min Axis I: Schizoaffective Disorder,  MDD  ADL's:  Intact  Sleep: Fair  Appetite:  Fair  Suicidal Ideation:  Plan:  denies Intent:  denies Means:  denies Homicidal Ideation:  Plan:  denies Intent:  denies Means:  denies  AEB (as evidenced by):  Psychiatric Specialty Exam: Physical Exam  Constitutional: He is oriented to person, place, and time. He appears well-developed.  HENT:  Head: Normocephalic.  Neck: Normal range of motion.  Musculoskeletal: Normal range of motion.  Neurological: He is alert and oriented to person, place, and time.  Skin: Skin is warm and dry.  Psychiatric: His speech is normal and behavior is normal. Judgment and thought content normal. His mood appears not anxious. His affect is not angry, not blunt, not labile and not inappropriate. Cognition and memory are normal. He does not exhibit a depressed mood.    Review of Systems   Constitutional: Negative.   HENT: Negative.   Eyes: Negative.   Respiratory: Negative.   Cardiovascular: Negative.   Gastrointestinal: Negative.   Genitourinary: Negative.   Musculoskeletal: Negative.   Skin: Negative.   Neurological: Negative.   Endo/Heme/Allergies: Negative.   Psychiatric/Behavioral: Positive for substance abuse. The patient is nervous/anxious.     Blood pressure 112/77, pulse 75, temperature 98.3 F (36.8 C), temperature source Oral, resp. rate 18, height 5' 11.5" (1.816 m), weight 97.07 kg (214 lb).Body mass index is 29.43 kg/(m^2).  General Appearance: Fairly Groomed  Patent attorneyye Contact::  Fair  Speech:  Clear and Coherent and not spontaneous  Volume:  Decreased  Mood:  Hopeful  Affect:  Appropriate and Congruent  Thought Process:  Coherent and Goal Directed  Orientation:  Full (Time, Place, and Person)  Thought Content:  Denies any hallucinations  Suicidal Thoughts:  No  Homicidal Thoughts:  No  Memory:  Immediate;   Fair Recent;   Fair Remote;   Fair  Judgement:  Fair  Insight:  Present and Shallow  Psychomotor Activity:  Decreased  Concentration:  Fair  Recall:  FiservFair  Fund of Knowledge:NA  Language: Fair  Akathisia:  No  Handed:    AIMS (if indicated):     Assets:  Desire for Improvement  Sleep:  Number of Hours: 6.75   Musculoskeletal: Strength & Muscle Tone: within normal limits Gait & Station: normal Patient leans: N/A  Current Medications: Current Facility-Administered Medications  Medication Dose Route Frequency Provider Last  Rate Last Dose  . abacavir (ZIAGEN) tablet 600 mg  600 mg Oral Daily Rachael FeeIrving A Lugo, MD   600 mg at 09/14/13 0901   And  . lamiVUDine (EPIVIR) tablet 300 mg  300 mg Oral Daily Rachael FeeIrving A Lugo, MD   300 mg at 09/14/13 0901  . acetaminophen (TYLENOL) tablet 650 mg  650 mg Oral Q6H PRN Sanjuana KavaAgnes I Nwoko, NP      . alum & mag hydroxide-simeth (MAALOX/MYLANTA) 200-200-20 MG/5ML suspension 30 mL  30 mL Oral Q4H PRN Sanjuana KavaAgnes I Nwoko,  NP      . ARIPiprazole (ABILIFY) tablet 5 mg  5 mg Oral Daily Sanjuana KavaAgnes I Nwoko, NP   5 mg at 09/14/13 0901  . atovaquone (MEPRON) 750 MG/5ML suspension 1,500 mg  1,500 mg Oral Daily Sanjuana KavaAgnes I Nwoko, NP   1,500 mg at 09/14/13 0900  . citalopram (CELEXA) tablet 40 mg  40 mg Oral Daily Sanjuana KavaAgnes I Nwoko, NP   40 mg at 09/14/13 0902  . darunavir-cobicistat (PREZCOBIX) 800-150 MG per tablet 1 tablet  1 tablet Oral Q breakfast Sanjuana KavaAgnes I Nwoko, NP   1 tablet at 09/14/13 0901  . gabapentin (NEURONTIN) capsule 100 mg  100 mg Oral TID Sanjuana KavaAgnes I Nwoko, NP   100 mg at 09/14/13 1153  . magnesium hydroxide (MILK OF MAGNESIA) suspension 30 mL  30 mL Oral Daily PRN Sanjuana KavaAgnes I Nwoko, NP      . multivitamin with minerals tablet 1 tablet  1 tablet Oral Daily Sanjuana KavaAgnes I Nwoko, NP   1 tablet at 09/14/13 0901  . nicotine (NICODERM CQ - dosed in mg/24 hours) patch 21 mg  21 mg Transdermal Q0600 Sanjuana KavaAgnes I Nwoko, NP   21 mg at 09/14/13 0640  . omega-3 acid ethyl esters (LOVAZA) capsule 1 g  1 g Oral Daily Sanjuana KavaAgnes I Nwoko, NP   1 g at 09/14/13 0902  . pantoprazole (PROTONIX) EC tablet 40 mg  40 mg Oral Daily Sanjuana KavaAgnes I Nwoko, NP   40 mg at 09/14/13 0900  . traZODone (DESYREL) tablet 50 mg  50 mg Oral QHS Sanjuana KavaAgnes I Nwoko, NP   50 mg at 09/13/13 2131    Lab Results: No results found for this or any previous visit (from the past 48 hour(s)).  Physical Findings: AIMS: Facial and Oral Movements Muscles of Facial Expression: None, normal Lips and Perioral Area: None, normal Jaw: None, normal Tongue: None, normal,Extremity Movements Upper (arms, wrists, hands, fingers): None, normal Lower (legs, knees, ankles, toes): None, normal, Trunk Movements Neck, shoulders, hips: None, normal, Overall Severity Severity of abnormal movements (highest score from questions above): None, normal Incapacitation due to abnormal movements: None, normal Patient's awareness of abnormal movements (rate only patient's report): No Awareness, Dental Status Current problems  with teeth and/or dentures?: No Does patient usually wear dentures?: No  CIWA:    COWS:     Treatment Plan Summary: Daily contact with patient to assess and evaluate symptoms and progress in treatment Medication management  Plan: Supportive approach/coping skills/relapse prevention Facilitate [facility]. Discharge plans in progress.  Medical Decision Making Problem Points:  Review of psycho-social stressors (1) Data Points:  Review of medication regiment & side effects (2) -no report of SE  I certify that inpatient services furnished can reasonably be expected to improve the patient's condition.   Canary BrimMary W Larach, PMHNP ,09/14/2013, 1:45 PM I agreed with findings and treatment plan of this patient

## 2013-09-14 NOTE — Progress Notes (Signed)
Patient did not attend AA meeting. Tonight we had a speaker meeting. He slept through the meeting.

## 2013-09-14 NOTE — Progress Notes (Signed)
D. Pt has been up and has been visible in milieu this evening, attending and participating in various activities. Pt spoke about how he is to be discharged on Monday and [facility]. Pt has spoken briefly about how he has relapsed in the past and how he plans on doing things differently so he does not relapse this time. A. Support and encouragement provided, medication education provided. R. Pt verbalized understanding, safety maintained.

## 2013-09-15 DIAGNOSIS — F1994 Other psychoactive substance use, unspecified with psychoactive substance-induced mood disorder: Secondary | ICD-10-CM

## 2013-09-15 MED ORDER — GABAPENTIN 100 MG PO CAPS: 100.0000 mg | ORAL_CAPSULE | Freq: Three times a day (TID) | ORAL | Status: DC

## 2013-09-15 MED ORDER — CITALOPRAM HYDROBROMIDE 40 MG PO TABS: 40.0000 mg | ORAL_TABLET | Freq: Every day | ORAL | Status: DC

## 2013-09-15 MED ORDER — DARUNAVIR-COBICISTAT 800-150 MG PO TABS: 1.0000 | ORAL_TABLET | Freq: Every day | ORAL | Status: DC

## 2013-09-15 MED ORDER — TRAZODONE HCL 50 MG PO TABS: 50.0000 mg | ORAL_TABLET | Freq: Every day | ORAL | Status: DC

## 2013-09-15 MED ORDER — ATOVAQUONE 750 MG/5ML PO SUSP: 1500.0000 mg | Freq: Every day | ORAL | Status: DC

## 2013-09-15 MED ORDER — ARIPIPRAZOLE 5 MG PO TABS: 5.0000 mg | ORAL_TABLET | Freq: Every day | ORAL | Status: DC

## 2013-09-15 MED ORDER — OMEPRAZOLE 20 MG PO CPDR: 20.0000 mg | DELAYED_RELEASE_CAPSULE | Freq: Every day | ORAL | Status: DC

## 2013-09-15 MED ORDER — ABACAVIR SULFATE-LAMIVUDINE 600-300 MG PO TABS: 1.0000 | ORAL_TABLET | Freq: Every day | ORAL | Status: DC
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
## 2013-09-15 NOTE — Progress Notes (Signed)
Patient ID: [name], male   DOB: 09/05/1955, 58 y.o.   MRN: [id number] [facility] MD Progress Note  09/15/2013 8:58 AM [name]  MRN:  [id number]  Subjective:  Mr [name] is up and about today.  He says is is better and doing well and  ready for a discharge to [facility] for further substance abuse treatment. He is hopeful for a better future  He rates his depression at 0/10 and Anxiety 0/10 He denies any SIHI, AVH.  Diagnosis:   DSM5: Schizophrenia Disorders:  none Obsessive-Compulsive Disorders:  none Trauma-Stressor Disorders:  none Substance/Addictive Disorders:  Alcohol Related Disorder - Severe (303.90) Depressive Disorders:  Major Depressive Disorder - Moderate (296.22) Total Time spent with patient: 25 min Axis I: Schizoaffective Disorder,  MDD  ADL's:  Intact  Sleep: Fair  Appetite:  Fair  Suicidal Ideation:  Plan:  denies Intent:  denies Means:  denies Homicidal Ideation:  Plan:  denies Intent:  denies Means:  denies  AEB (as evidenced by):  Psychiatric Specialty Exam: Physical Exam  Constitutional: He is oriented to person, place, and time. He appears well-developed.  HENT:  Head: Normocephalic.  Neck: Normal range of motion.  Musculoskeletal: Normal range of motion.  Neurological: He is alert and oriented to person, place, and time.  Skin: Skin is warm and dry.  Psychiatric: His speech is normal and behavior is normal. Judgment and thought content normal. His mood appears not anxious. His affect is not angry, not blunt, not labile and not inappropriate. Cognition and memory are normal. He does not exhibit a depressed mood.    Review of Systems  Constitutional: Negative.   HENT: Negative.   Eyes: Negative.   Respiratory: Negative.   Cardiovascular: Negative.   Gastrointestinal: Negative.   Genitourinary: Negative.   Musculoskeletal: Negative.   Skin: Negative.   Neurological: Negative.   Endo/Heme/Allergies: Negative.    Psychiatric/Behavioral: Positive for substance abuse. The patient is nervous/anxious.     Blood pressure 130/93, pulse 59, temperature 97.9 F (36.6 C), temperature source Oral, resp. rate 18, height 5' 11.5" (1.816 m), weight 97.07 kg (214 lb).Body mass index is 29.43 kg/(m^2).  General Appearance: Fairly Groomed  Patent attorneyye Contact::  Fair  Speech:  Clear and Coherent and not spontaneous  Volume:  Decreased  Mood:  Hopeful  Affect:  Appropriate and Congruent  Thought Process:  Coherent and Goal Directed  Orientation:  Full (Time, Place, and Person)  Thought Content:  Denies any hallucinations  Suicidal Thoughts:  No  Homicidal Thoughts:  No  Memory:  Immediate;   Fair Recent;   Fair Remote;   Fair  Judgement:  Fair  Insight:  Present and Shallow  Psychomotor Activity:  Decreased  Concentration:  Fair  Recall:  FiservFair  Fund of Knowledge:NA  Language: Fair  Akathisia:  No  Handed:    AIMS (if indicated):     Assets:  Desire for Improvement  Sleep:  Number of Hours: 6.75   Musculoskeletal: Strength & Muscle Tone: within normal limits Gait & Station: normal Patient leans: N/A  Current Medications: Current Facility-Administered Medications  Medication Dose Route Frequency Provider Last Rate Last Dose  . abacavir (ZIAGEN) tablet 600 mg  600 mg Oral Daily Rachael FeeIrving A Lugo, MD   600 mg at 09/15/13 [id number]   And  . lamiVUDine (EPIVIR) tablet 300 mg  300 mg Oral Daily Rachael FeeIrving A Lugo, MD   300 mg at 09/15/13 [id number]  . acetaminophen (TYLENOL) tablet  650 mg  650 mg Oral Q6H PRN Sanjuana KavaAgnes I Nwoko, NP      . alum & mag hydroxide-simeth (MAALOX/MYLANTA) 200-200-20 MG/5ML suspension 30 mL  30 mL Oral Q4H PRN Sanjuana KavaAgnes I Nwoko, NP      . ARIPiprazole (ABILIFY) tablet 5 mg  5 mg Oral Daily Sanjuana KavaAgnes I Nwoko, NP   5 mg at 09/15/13 0829  . atovaquone (MEPRON) 750 MG/5ML suspension 1,500 mg  1,500 mg Oral Daily Sanjuana KavaAgnes I Nwoko, NP   1,500 mg at 09/15/13 0829  . citalopram (CELEXA) tablet 40 mg  40 mg Oral Daily Sanjuana KavaAgnes I  Nwoko, NP   40 mg at 09/15/13 0830  . darunavir-cobicistat (PREZCOBIX) 800-150 MG per tablet 1 tablet  1 tablet Oral Q breakfast Sanjuana KavaAgnes I Nwoko, NP   1 tablet at 09/15/13 0829  . gabapentin (NEURONTIN) capsule 100 mg  100 mg Oral TID Sanjuana KavaAgnes I Nwoko, NP   100 mg at 09/15/13 0830  . magnesium hydroxide (MILK OF MAGNESIA) suspension 30 mL  30 mL Oral Daily PRN Sanjuana KavaAgnes I Nwoko, NP      . multivitamin with minerals tablet 1 tablet  1 tablet Oral Daily Sanjuana KavaAgnes I Nwoko, NP   1 tablet at 09/15/13 0830  . nicotine (NICODERM CQ - dosed in mg/24 hours) patch 21 mg  21 mg Transdermal Q0600 Sanjuana KavaAgnes I Nwoko, NP   21 mg at 09/15/13 [id number]  . omega-3 acid ethyl esters (LOVAZA) capsule 1 g  1 g Oral Daily Sanjuana KavaAgnes I Nwoko, NP   1 g at 09/15/13 0829  . pantoprazole (PROTONIX) EC tablet 40 mg  40 mg Oral Daily Sanjuana KavaAgnes I Nwoko, NP   40 mg at 09/15/13 0829  . traZODone (DESYREL) tablet 50 mg  50 mg Oral QHS Sanjuana KavaAgnes I Nwoko, NP   50 mg at 09/14/13 2138    Lab Results: No results found for this or any previous visit (from the past 48 hour(s)).  Physical Findings: AIMS: Facial and Oral Movements Muscles of Facial Expression: None, normal Lips and Perioral Area: None, normal Jaw: None, normal Tongue: None, normal,Extremity Movements Upper (arms, wrists, hands, fingers): None, normal Lower (legs, knees, ankles, toes): None, normal, Trunk Movements Neck, shoulders, hips: None, normal, Overall Severity Severity of abnormal movements (highest score from questions above): None, normal Incapacitation due to abnormal movements: None, normal Patient's awareness of abnormal movements (rate only patient's report): No Awareness, Dental Status Current problems with teeth and/or dentures?: No Does patient usually wear dentures?: No  CIWA:    COWS:     Treatment Plan Summary: Daily contact with patient to assess and evaluate symptoms and progress in treatment Medication management  Plan: Supportive approach/coping skills/relapse  prevention Facilitate [facility]. Discharge plans in progress. -discussed importance of choice and personal responsibility in healthcare  Medical Decision Making Problem Points:  Review of psycho-social stressors (1) Data Points:  Review of medication regiment & side effects (2) -no report of SE  I certify that inpatient services furnished can reasonably be expected to improve the patient's condition.   Canary BrimMary W Larach, PMHNP ,09/15/2013, 8:58 AM Agree with the findings and plan.

## 2013-09-15 NOTE — Progress Notes (Signed)
D) Pt has been attending the groups and interacting with his peers. Talked with Pt about his medications and how he will obtain them. Pt was suppose to call his sister and ask her if she could bring the medications to the hospital. Pt was told that he would receive enough medications for two weeks and then he could [facility] the [facility]. Pt states he feels ready to be discharged. Affect and mood are appropriate. A) Given support, reassurance and praise. Encouraged to give it his [facility]. R) Denies SI and HI.

## 2013-09-15 NOTE — Progress Notes (Signed)
Patient notified of AA group meeting, but did not attend. Patient stayed in bed throughout group.

## 2013-09-15 NOTE — Progress Notes (Signed)
Pt pleasant and appropriate. Asking about discharge plan for the morning, specifically 2 week supply of meds. States he will catch a ride with a fellow patient whose mother is transporting him. Pt reassured that meds are ready with his discharge instructions. Medicated with trazadone per orders. Denies SI/HI/AVH. No pain or physical problems. [name]

## 2013-09-15 NOTE — Progress Notes (Signed)
[facility] Group Notes:  (Nursing/MHT/Case Management/Adjunct)  Date:  09/15/2013  Time:  6:26 PM  Type of Therapy:  Therapeutic Activity  Participation Level:  Active  Participation Quality:  Appropriate, Attentive and Supportive  Affect:  Appropriate  Cognitive:  Appropriate  Insight:  Appropriate  Engagement in Group:  Engaged and Supportive  Modes of Intervention:  Activity  Summary of Progress/Problems: Pts played an activity using the Therapeutic Ball. Pt was engaged.  [name] 09/15/2013, 6:26 PM 

## 2013-09-15 NOTE — [facility] Group Notes (Signed)
[facility] LCSW Group Therapy Note  09/15/2013 / 10:00 AM   Type of Therapy and Topic: Group Therapy: Feelings Around Returning Home & Establishing a Supportive Framework and Activity to Identify signs of Improvement or Decompensation   Participation Level: Did not attend  Eaton Folmar C Maliek Schellhorn, LCSW   

## 2013-09-16 ENCOUNTER — Other Ambulatory Visit: Payer: Self-pay | Admitting: Licensed Clinical Social Worker

## 2013-09-16 DIAGNOSIS — N529 Male erectile dysfunction, unspecified: Secondary | ICD-10-CM

## 2013-09-16 NOTE — Telephone Encounter (Signed)
I called patient to see what pharmacy to call the Viagra to but the number listed belong to a clinic. If patient calls we need a pharmacy to call medication in.

## 2013-09-16 NOTE — Telephone Encounter (Signed)
That is fine. Should still use condoms though> We need to treat his hep c

## 2013-09-16 NOTE — Progress Notes (Signed)
Pt discharged this morning to [facility].  Discharge instructions reviewed with pt.  Documents signed and sample meds given to patient.  Pt in good mood and ready for discharge.  Pt denies SI/HI/AV.  Pt thanked Clinical research associatewriter for his care [facility].  Pt to be transported by friend.  Pt safe at discharge.

## 2013-09-16 NOTE — Progress Notes (Signed)
Pt resting in bed with eyes closed.  No distress observed.  Respirations even/unlabored.  Safety maintained with q15 minute checks. 

## 2013-09-17 NOTE — Telephone Encounter (Signed)
I THINK HE HAS BEEN ADMITTED TO [facility]

## 2013-09-18 NOTE — Progress Notes (Signed)
If uninsured, I think we can get him on patient assistance and get him on treatment despite his substance abuse and in fact because of it in that whole argument for rx as prevention

## 2013-09-19 NOTE — Progress Notes (Signed)
Patient Discharge Instructions:  After Visit Summary (AVS):   Faxed to:  09/19/13 Discharge Summary Note:   Faxed to:  09/19/13 Psychiatric Admission Assessment Note:   Faxed to:  09/19/13 Suicide Risk Assessment - Discharge Assessment:   Faxed to:  09/19/13 Faxed/Sent to the Next Level Care provider:  09/19/13 Faxed to [facility] @ [phone] [facility]ax[facility] to [facility] @ [phone]  Jerelene ReddenSheena E Chemung, 09/19/2013, 3:39 PM

## 2013-09-24 ENCOUNTER — Ambulatory Visit: Payer: Self-pay | Admitting: Infectious Disease

## 2013-09-24 ENCOUNTER — Encounter: Payer: Self-pay | Admitting: *Deleted

## 2013-09-26 NOTE — Telephone Encounter (Signed)
Error

## 2013-10-04 ENCOUNTER — Telehealth: Payer: Self-pay | Admitting: *Deleted

## 2013-10-04 NOTE — Telephone Encounter (Signed)
Patient called c/o back pain and wanted something prescribed for this. Asked patient if he had tried ibuprofen and he said, "no it hurts my stomach". Advised he needed to be evaluated and he said he missed his follow up appointment due to being in the hospital. Scheduled appt for 10/28/13. Wendall MolaJacqueline Keyanni [place]ittinghill

## 2013-10-10 ENCOUNTER — Encounter (HOSPITAL_COMMUNITY): Payer: Self-pay | Admitting: Emergency Medicine

## 2013-10-10 ENCOUNTER — Emergency Department (HOSPITAL_COMMUNITY)
Admission: EM | Admit: 2013-10-10 | Discharge: 2013-10-10 | Discharge status: Against Medical Advice | Payer: Medicare Other | Attending: Emergency Medicine | Admitting: Emergency Medicine

## 2013-10-10 DIAGNOSIS — F329 Major depressive disorder, single episode, unspecified: Secondary | ICD-10-CM | POA: Insufficient documentation

## 2013-10-10 DIAGNOSIS — Z87448 Personal history of other diseases of urinary system: Secondary | ICD-10-CM | POA: Insufficient documentation

## 2013-10-10 DIAGNOSIS — F1010 Alcohol abuse, uncomplicated: Secondary | ICD-10-CM

## 2013-10-10 DIAGNOSIS — Z8614 Personal history of Methicillin resistant Staphylococcus aureus infection: Secondary | ICD-10-CM | POA: Insufficient documentation

## 2013-10-10 DIAGNOSIS — Z8739 Personal history of other diseases of the musculoskeletal system and connective tissue: Secondary | ICD-10-CM | POA: Insufficient documentation

## 2013-10-10 DIAGNOSIS — J4489 Other specified chronic obstructive pulmonary disease: Secondary | ICD-10-CM | POA: Insufficient documentation

## 2013-10-10 DIAGNOSIS — Z79899 Other long term (current) drug therapy: Secondary | ICD-10-CM | POA: Insufficient documentation

## 2013-10-10 DIAGNOSIS — J449 Chronic obstructive pulmonary disease, unspecified: Secondary | ICD-10-CM | POA: Insufficient documentation

## 2013-10-10 DIAGNOSIS — F3289 Other specified depressive episodes: Secondary | ICD-10-CM | POA: Insufficient documentation

## 2013-10-10 DIAGNOSIS — Z87828 Personal history of other (healed) physical injury and trauma: Secondary | ICD-10-CM | POA: Insufficient documentation

## 2013-10-10 DIAGNOSIS — F17200 Nicotine dependence, unspecified, uncomplicated: Secondary | ICD-10-CM | POA: Insufficient documentation

## 2013-10-10 DIAGNOSIS — F1410 Cocaine abuse, uncomplicated: Secondary | ICD-10-CM | POA: Insufficient documentation

## 2013-10-10 DIAGNOSIS — Z21 Asymptomatic human immunodeficiency virus [HIV] infection status: Secondary | ICD-10-CM | POA: Insufficient documentation
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
## 2013-10-10 NOTE — ED Notes (Signed)
PT left AMA 
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
## 2013-10-18 ENCOUNTER — Other Ambulatory Visit (HOSPITAL_COMMUNITY): Payer: Self-pay | Admitting: Internal Medicine

## 2013-10-19 ENCOUNTER — Other Ambulatory Visit (HOSPITAL_COMMUNITY): Payer: Self-pay | Admitting: Internal Medicine

## 2013-10-25 ENCOUNTER — Emergency Department (HOSPITAL_COMMUNITY)
Admission: EM | Admit: 2013-10-25 | Discharge: 2013-10-25 | Discharge status: Against Medical Advice | Payer: Medicare Other | Attending: Emergency Medicine | Admitting: Emergency Medicine

## 2013-10-25 DIAGNOSIS — Z79899 Other long term (current) drug therapy: Secondary | ICD-10-CM | POA: Insufficient documentation

## 2013-10-25 DIAGNOSIS — F1010 Alcohol abuse, uncomplicated: Secondary | ICD-10-CM | POA: Insufficient documentation

## 2013-10-25 DIAGNOSIS — IMO0002 Reserved for concepts with insufficient information to code with codable children: Secondary | ICD-10-CM | POA: Diagnosis not present

## 2013-10-25 DIAGNOSIS — F329 Major depressive disorder, single episode, unspecified: Secondary | ICD-10-CM | POA: Insufficient documentation

## 2013-10-25 DIAGNOSIS — F10929 Alcohol use, unspecified with intoxication, unspecified: Secondary | ICD-10-CM

## 2013-10-25 DIAGNOSIS — F17200 Nicotine dependence, unspecified, uncomplicated: Secondary | ICD-10-CM | POA: Diagnosis not present

## 2013-10-25 DIAGNOSIS — Z046 Encounter for general psychiatric examination, requested by authority: Secondary | ICD-10-CM | POA: Diagnosis present

## 2013-10-25 DIAGNOSIS — Z8739 Personal history of other diseases of the musculoskeletal system and connective tissue: Secondary | ICD-10-CM | POA: Insufficient documentation

## 2013-10-25 DIAGNOSIS — F911 Conduct disorder, childhood-onset type: Secondary | ICD-10-CM | POA: Insufficient documentation

## 2013-10-25 DIAGNOSIS — Z8614 Personal history of Methicillin resistant Staphylococcus aureus infection: Secondary | ICD-10-CM | POA: Diagnosis not present

## 2013-10-25 DIAGNOSIS — Z21 Asymptomatic human immunodeficiency virus [HIV] infection status: Secondary | ICD-10-CM | POA: Insufficient documentation

## 2013-10-25 DIAGNOSIS — F3289 Other specified depressive episodes: Secondary | ICD-10-CM | POA: Diagnosis not present

## 2013-10-25 DIAGNOSIS — Z87448 Personal history of other diseases of urinary system: Secondary | ICD-10-CM | POA: Diagnosis not present

## 2013-10-25 DIAGNOSIS — J449 Chronic obstructive pulmonary disease, unspecified: Secondary | ICD-10-CM | POA: Insufficient documentation

## 2013-10-25 DIAGNOSIS — J4489 Other specified chronic obstructive pulmonary disease: Secondary | ICD-10-CM | POA: Insufficient documentation

## 2013-10-25 LAB — CBC
HCT: 40.7 % (ref 39.0–52.0)
Hemoglobin: 13.7 g/dL (ref 13.0–17.0)
MCH: 31.9 pg (ref 26.0–34.0)
MCHC: 33.7 g/dL (ref 30.0–36.0)
MCV: 94.9 fL (ref 78.0–100.0)
Platelets: 112 10*3/uL — ABNORMAL LOW (ref 150–400)
RBC: 4.29 MIL/uL (ref 4.22–5.81)
RDW: 12.7 % (ref 11.5–15.5)
WBC: 3.6 10*3/uL — ABNORMAL LOW (ref 4.0–10.5)

## 2013-10-25 LAB — COMPREHENSIVE METABOLIC PANEL
ALT: 85 U/L — ABNORMAL HIGH (ref 0–53)
AST: 157 U/L — ABNORMAL HIGH (ref 0–37)
Albumin: 3.4 g/dL — ABNORMAL LOW (ref 3.5–5.2)
Alkaline Phosphatase: 67 U/L (ref 39–117)
BUN: 10 mg/dL (ref 6–23)
CO2: 26 mEq/L (ref 19–32)
Calcium: 8.8 mg/dL (ref 8.4–10.5)
Chloride: 107 mEq/L (ref 96–112)
Creatinine, Ser: 0.82 mg/dL (ref 0.50–1.35)
GFR calc Af Amer: >90 mL/min (ref >90)
GFR calc non Af Amer: >90 mL/min (ref >90)
Glucose, Bld: 90 mg/dL (ref 70–99)
Potassium: 4.2 mEq/L (ref 3.7–5.3)
Sodium: 145 mEq/L (ref 137–147)
Total Bilirubin: 0.5 mg/dL (ref 0.3–1.2)
Total Protein: 8.1 g/dL (ref 6.0–8.3)

## 2013-10-25 LAB — SALICYLATE LEVEL: Salicylate Lvl: <2 mg/dL — ABNORMAL LOW (ref 2.8–20.0)

## 2013-10-25 LAB — ACETAMINOPHEN LEVEL: Acetaminophen (Tylenol), Serum: <15 ug/mL (ref 10–30)

## 2013-10-25 LAB — ETHANOL: Alcohol, Ethyl (B): 253 mg/dL — ABNORMAL HIGH (ref 0–11)

## 2013-10-25 MED ORDER — TETRACAINE HCL 0.5 % OP SOLN: 1.0000 [drp] | Freq: Once | OPHTHALMIC | Status: DC
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
## 2013-10-25 NOTE — ED Notes (Signed)
Given sandwich, soda and urinal. Explained that we needed a urine specimen. Patient mumbled that he understood.

## 2013-10-25 NOTE — ED Provider Notes (Signed)
Medical screening examination/treatment/procedure(s) were conducted as a shared visit with non-physician practitioner(s) and myself.  I personally evaluated the patient during the encounter.   EKG Interpretation None       Patient intoxicated, requesting detox. Intermittently rude to staff, no physical altercations. Is able to ambulate normally. Requesting detox. Nurse informed me afterwards that he stated he was tired of being here and wasn't being fed a second sandwich so he left on his own accord.  Audree Camel, MD 10/25/13 [phone]
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
## 2013-10-27 ENCOUNTER — Encounter (HOSPITAL_COMMUNITY): Payer: Self-pay | Admitting: Emergency Medicine

## 2013-10-27 ENCOUNTER — Emergency Department (HOSPITAL_COMMUNITY): Payer: Medicare Other

## 2013-10-27 ENCOUNTER — Emergency Department (HOSPITAL_COMMUNITY)
Admission: EM | Admit: 2013-10-27 | Discharge: 2013-10-28 | Disposition: A | Discharge status: Home / Self Care | Payer: Medicare Other | Attending: Emergency Medicine | Admitting: Emergency Medicine

## 2013-10-27 DIAGNOSIS — W57XXXA Bitten or stung by nonvenomous insect and other nonvenomous arthropods, initial encounter: Secondary | ICD-10-CM | POA: Insufficient documentation

## 2013-10-27 DIAGNOSIS — Z79899 Other long term (current) drug therapy: Secondary | ICD-10-CM | POA: Insufficient documentation

## 2013-10-27 DIAGNOSIS — Z5900 Homelessness unspecified: Secondary | ICD-10-CM | POA: Insufficient documentation

## 2013-10-27 DIAGNOSIS — S298XXA Other specified injuries of thorax, initial encounter: Secondary | ICD-10-CM | POA: Insufficient documentation

## 2013-10-27 DIAGNOSIS — J449 Chronic obstructive pulmonary disease, unspecified: Secondary | ICD-10-CM | POA: Insufficient documentation

## 2013-10-27 DIAGNOSIS — R0789 Other chest pain: Secondary | ICD-10-CM

## 2013-10-27 DIAGNOSIS — S30860A Insect bite (nonvenomous) of lower back and pelvis, initial encounter: Secondary | ICD-10-CM | POA: Insufficient documentation

## 2013-10-27 DIAGNOSIS — F3289 Other specified depressive episodes: Secondary | ICD-10-CM | POA: Insufficient documentation

## 2013-10-27 DIAGNOSIS — Z21 Asymptomatic human immunodeficiency virus [HIV] infection status: Secondary | ICD-10-CM | POA: Insufficient documentation

## 2013-10-27 DIAGNOSIS — S40269A Insect bite (nonvenomous) of unspecified shoulder, initial encounter: Secondary | ICD-10-CM | POA: Insufficient documentation

## 2013-10-27 DIAGNOSIS — Z8739 Personal history of other diseases of the musculoskeletal system and connective tissue: Secondary | ICD-10-CM | POA: Insufficient documentation

## 2013-10-27 DIAGNOSIS — Z87448 Personal history of other diseases of urinary system: Secondary | ICD-10-CM | POA: Insufficient documentation

## 2013-10-27 DIAGNOSIS — F329 Major depressive disorder, single episode, unspecified: Secondary | ICD-10-CM | POA: Insufficient documentation

## 2013-10-27 DIAGNOSIS — J4489 Other specified chronic obstructive pulmonary disease: Secondary | ICD-10-CM | POA: Insufficient documentation

## 2013-10-27 DIAGNOSIS — Y9289 Other specified places as the place of occurrence of the external cause: Secondary | ICD-10-CM | POA: Insufficient documentation

## 2013-10-27 DIAGNOSIS — W1809XA Striking against other object with subsequent fall, initial encounter: Secondary | ICD-10-CM | POA: Insufficient documentation

## 2013-10-27 DIAGNOSIS — F17200 Nicotine dependence, unspecified, uncomplicated: Secondary | ICD-10-CM | POA: Insufficient documentation

## 2013-10-27 DIAGNOSIS — Y929 Unspecified place or not applicable: Secondary | ICD-10-CM | POA: Insufficient documentation

## 2013-10-27 DIAGNOSIS — W010XXA Fall on same level from slipping, tripping and stumbling without subsequent striking against object, initial encounter: Secondary | ICD-10-CM

## 2013-10-27 DIAGNOSIS — Z8614 Personal history of Methicillin resistant Staphylococcus aureus infection: Secondary | ICD-10-CM | POA: Insufficient documentation

## 2013-10-27 DIAGNOSIS — Y9389 Activity, other specified: Secondary | ICD-10-CM | POA: Insufficient documentation

## 2013-10-27 MED ORDER — CALAMINE EX LOTN
TOPICAL_LOTION | Freq: Three times a day (TID) | CUTANEOUS | Status: DC
  Administered 2013-10-28: 1 via TOPICAL
  Filled 2013-10-27: qty 118

## 2013-10-27 MED ORDER — DIPHENHYDRAMINE HCL 25 MG PO CAPS
25.0000 mg | ORAL_CAPSULE | Freq: Once | ORAL | Status: AC
  Administered 2013-10-27: 25 mg via ORAL
  Filled 2013-10-27: qty 1

## 2013-10-27 NOTE — ED Notes (Signed)
Pt states he was going to detox on Friday or Saturday and he tripped getting out of the ambulance and hit his chest on the cement. Pt reports increased pain with deep inhalation. Pt rates pain 10/10. Pt denies taking any medication for sx. Pt also reports bumps all over his arms and back. Pt states he slept in a field due to being homeless and noticed the bumps afterwards.
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
## 2013-10-27 NOTE — ED Notes (Signed)
Pt reports he tripped off curb striking chest, he now has CP that is worse with movement. Denies nausea, diaphoresis, SOB. Pt also has itching rash to bilateral arms and back. States he is homeless, so "I have no idea what it could be from." Denies pain to rash. (Note: Per MD Fayrene Fearing, will no order troponin or blood work at this time). Pt in NAD.

## 2013-10-28 ENCOUNTER — Other Ambulatory Visit (HOSPITAL_COMMUNITY): Payer: Self-pay | Admitting: Internal Medicine

## 2013-10-28 ENCOUNTER — Ambulatory Visit (INDEPENDENT_AMBULATORY_CARE_PROVIDER_SITE_OTHER): Payer: Medicare Other | Admitting: Infectious Disease

## 2013-10-28 ENCOUNTER — Encounter: Payer: Self-pay | Admitting: Infectious Disease

## 2013-10-28 VITALS — BP 108/75 | HR 88 | Temp 97.7°F | Wt 212.2 lb

## 2013-10-28 DIAGNOSIS — G609 Hereditary and idiopathic neuropathy, unspecified: Secondary | ICD-10-CM

## 2013-10-28 DIAGNOSIS — T39395A Adverse effect of other nonsteroidal anti-inflammatory drugs [NSAID], initial encounter: Secondary | ICD-10-CM

## 2013-10-28 DIAGNOSIS — F1410 Cocaine abuse, uncomplicated: Secondary | ICD-10-CM

## 2013-10-28 DIAGNOSIS — B1920 Unspecified viral hepatitis C without hepatic coma: Secondary | ICD-10-CM

## 2013-10-28 DIAGNOSIS — F1490 Cocaine use, unspecified, uncomplicated: Secondary | ICD-10-CM

## 2013-10-28 DIAGNOSIS — D696 Thrombocytopenia, unspecified: Secondary | ICD-10-CM

## 2013-10-28 DIAGNOSIS — F1010 Alcohol abuse, uncomplicated: Secondary | ICD-10-CM

## 2013-10-28 DIAGNOSIS — B20 Human immunodeficiency virus [HIV] disease: Secondary | ICD-10-CM

## 2013-10-28 DIAGNOSIS — K259 Gastric ulcer, unspecified as acute or chronic, without hemorrhage or perforation: Secondary | ICD-10-CM

## 2013-10-28 LAB — COMPLETE METABOLIC PANEL WITH GFR
ALT: 70 U/L — ABNORMAL HIGH (ref 0–53)
AST: 105 U/L — ABNORMAL HIGH (ref 0–37)
Albumin: 3.3 g/dL — ABNORMAL LOW (ref 3.5–5.2)
Alkaline Phosphatase: 60 U/L (ref 39–117)
BUN: 13 mg/dL (ref 6–23)
CO2: 29 mEq/L (ref 19–32)
Calcium: 8.8 mg/dL (ref 8.4–10.5)
Chloride: 105 mEq/L (ref 96–112)
Creat: 0.9 mg/dL (ref 0.50–1.35)
GFR, Est African American: >89 mL/min
GFR, Est Non African American: >89 mL/min
Glucose, Bld: 78 mg/dL (ref 70–99)
Potassium: 3.4 mEq/L — ABNORMAL LOW (ref 3.5–5.3)
Sodium: 139 mEq/L (ref 135–145)
Total Bilirubin: 1.3 mg/dL — ABNORMAL HIGH (ref 0.2–1.2)
Total Protein: 7.3 g/dL (ref 6.0–8.3)

## 2013-10-28 MED ORDER — IBUPROFEN 400 MG PO TABS: 400.0000 mg | ORAL_TABLET | Freq: Four times a day (QID) | ORAL | Status: DC | PRN

## 2013-10-28 MED ORDER — ATOVAQUONE 750 MG/5ML PO SUSP: 1500.0000 mg | Freq: Every day | ORAL | Status: DC

## 2013-10-28 MED ORDER — CALAMINE EX LOTN: TOPICAL_LOTION | Freq: Three times a day (TID) | CUTANEOUS | Status: DC

## 2013-10-28 MED ORDER — DIPHENHYDRAMINE HCL 25 MG PO CAPS: 25.0000 mg | ORAL_CAPSULE | Freq: Three times a day (TID) | ORAL | Status: DC | PRN

## 2013-10-28 MED ORDER — CITALOPRAM HYDROBROMIDE 40 MG PO TABS: 40.0000 mg | ORAL_TABLET | Freq: Every day | ORAL | Status: DC

## 2013-10-28 MED ORDER — IBUPROFEN 200 MG PO TABS
400.0000 mg | ORAL_TABLET | Freq: Four times a day (QID) | ORAL | Status: DC | PRN
  Administered 2013-10-28: 400 mg via ORAL
  Filled 2013-10-28: qty 1

## 2013-10-28 MED ORDER — GABAPENTIN 100 MG PO CAPS: 100.0000 mg | ORAL_CAPSULE | Freq: Three times a day (TID) | ORAL | Status: DC

## 2013-10-28 MED ORDER — OMEPRAZOLE 20 MG PO CPDR: 20.0000 mg | DELAYED_RELEASE_CAPSULE | Freq: Every day | ORAL | Status: DC

## 2013-10-28 NOTE — Progress Notes (Signed)
  Subjective:    Patient ID: [name], male    DOB: 1955/09/01, 58 y.o.   MRN: [id number]  HPI   58 year old ever American man with HIV who had been incarcerated. 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 He ultimately had been admitted to [facility] where he had suffered from MRSA bacteremia. He was treated with IV antibiotics and ultimately discharged and establish care [facility].   His HIV has since been  perfectly controlled with a viral load of less than 20 to 83 (most recent) copies on Epzicom Prezista and Norvir and now taking prezcobix and epzicom.  His CD4 count is up to 160. He is taking atovaquone for PCP prophylaxis.  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  He was in fact again yesterday in ED after falling on chest and before that for etoh intoxication. He is currently homeless.           Review of Systems  Respiratory: Negative for cough, chest tightness, shortness of breath, wheezing and stridor.   Cardiovascular: Positive for chest pain. Negative for palpitations and leg swelling.  Gastrointestinal: Negative for nausea, vomiting, abdominal pain, diarrhea, constipation and abdominal distention.  Genitourinary: Negative for dysuria, hematuria, flank pain and difficulty urinating.  Musculoskeletal: Negative for arthralgias, back pain, gait problem, joint swelling and myalgias.  Skin: Negative for color change, pallor, rash and wound.  Neurological: Positive for dizziness and weakness. Negative for tremors and light-headedness.  Hematological: Negative for adenopathy. Does not bruise/bleed easily.  Psychiatric/Behavioral: Positive for behavioral problems and decreased concentration. Negative for suicidal ideas,  confusion, sleep disturbance, dysphoric mood and agitation.       Objective:   Physical Exam  Constitutional: He is oriented to person, place, and time. He appears well-developed and well-nourished. No distress.  HENT:  Head: Normocephalic and atraumatic.  Mouth/Throat: No oropharyngeal exudate.  Eyes: Conjunctivae and EOM are normal.  Neck: Normal range of motion. Neck supple.  Cardiovascular: Normal rate and regular rhythm.   Pulmonary/Chest: Effort normal. He has no wheezes.  Abdominal: He exhibits no distension.  Musculoskeletal: He exhibits no edema and no tenderness.  Lymphadenopathy:    He has no cervical adenopathy.  Neurological: He is alert and oriented to person, place, and time. He exhibits normal muscle tone. Coordination normal.  Skin: Skin is warm and dry. He is not diaphoretic. No erythema.  Psychiatric: His behavior is normal. Judgment and thought content normal. He exhibits a depressed mood.          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 I am confident he could adhere to a once daily pill for 3 months to cure his HIV given his excellent adherence to his HIV meds  I do worry his payor source may mandate that his alcoholism is controlled prior to authorizing coverage  #3 Crack cocaine and etoh abuse: relapsing frequently came to ED intoxicated 2 days ago desiring detox

## 2013-10-28 NOTE — Discharge Instructions (Signed)
Chest Wall Pain Chest wall pain is pain in or around the bones and muscles of your chest. It may take up to 6 weeks to get better. It may take longer if you must stay physically active in your work and activities.  CAUSES  Chest wall pain may happen on its own. However, it may be caused by:  A viral illness like the flu.  Injury.  Coughing.  Exercise.  Arthritis.  Fibromyalgia.  Shingles. HOME CARE INSTRUCTIONS   Avoid overtiring physical activity. Try not to strain or perform activities that cause pain. This includes any activities using your chest or your abdominal and side muscles, especially if heavy weights are used.  Put ice on the sore area.  Put ice in a plastic bag.  Place a towel between your skin and the bag.  Leave the ice on for 15-20 minutes per hour while awake for the first 2 days.  Only take over-the-counter or prescription medicines for pain, discomfort, or fever as directed by your caregiver. SEEK IMMEDIATE MEDICAL CARE IF:   Your pain increases, or you are very uncomfortable.  You have a fever.  Your chest pain becomes worse.  You have new, unexplained symptoms.  You have nausea or vomiting.  You feel sweaty or lightheaded.  You have a cough with phlegm (sputum), or you cough up blood. MAKE SURE YOU:   Understand these instructions.  Will watch your condition.  Will get help right away if you are not doing well or get worse. Document Released: 05/09/2005 Document Revised: 08/01/2011 Document Reviewed: 01/03/2011 [facility] Patient Information 2014 [place], [place].  Insect Bite Mosquitoes, flies, fleas, bedbugs, and many other insects can bite. Insect bites are different from insect stings. A sting is when venom is injected into the skin. Some insect bites can transmit infectious diseases. SYMPTOMS  Insect bites usually turn red, swell, and itch for 2 to 4 days. They often go away on their own. TREATMENT  Your caregiver may prescribe  antibiotic medicines if a bacterial infection develops in the bite. HOME CARE INSTRUCTIONS  Do not scratch the bite area.  Keep the bite area clean and dry. Wash the bite area thoroughly with soap and water.  Put ice or cool compresses on the bite area.  Put ice in a plastic bag.  Place a towel between your skin and the bag.  Leave the ice on for 20 minutes, 4 times a day for the first 2 to 3 days, or as directed.  You may apply a baking soda paste, cortisone cream, or calamine lotion to the bite area as directed by your caregiver. This can help reduce itching and swelling.  Only take over-the-counter or prescription medicines as directed by your caregiver.  If you are given antibiotics, take them as directed. Finish them even if you start to feel better. You may need a tetanus shot if:  You cannot remember when you had your last tetanus shot.  You have never had a tetanus shot.  The injury broke your skin. If you get a tetanus shot, your arm may swell, get red, and feel warm to the touch. This is common and not a problem. If you need a tetanus shot and you choose not to have one, there is a rare chance of getting tetanus. Sickness from tetanus can be serious. SEEK IMMEDIATE MEDICAL CARE IF:   You have increased pain, redness, or swelling in the bite area.  You see a red line on the skin coming from  the bite.  You have a fever.  You have joint pain.  You have a headache or neck pain.  You have unusual weakness.  You have a rash.  You have chest pain or shortness of breath.  You have abdominal pain, nausea, or vomiting.  You feel unusually tired or sleepy. MAKE SURE YOU:   Understand these instructions.  Will watch your condition.  Will get help right away if you are not doing well or get worse. Document Released: 06/16/2004 Document Revised: 08/01/2011 Document Reviewed: 12/08/2010 [facility] Patient Information 2014 [place]

## 2013-10-28 NOTE — ED Notes (Signed)
Discharge instructions reviewed with pt. Pt verbalized understanding.   

## 2013-10-29 ENCOUNTER — Emergency Department (INDEPENDENT_AMBULATORY_CARE_PROVIDER_SITE_OTHER)
Admission: EM | Admit: 2013-10-29 | Discharge: 2013-10-29 | Disposition: A | Discharge status: Home / Self Care | Payer: PRIVATE HEALTH INSURANCE | Source: Home / Self Care | Attending: Emergency Medicine | Admitting: Emergency Medicine

## 2013-10-29 ENCOUNTER — Encounter (HOSPITAL_COMMUNITY): Payer: Self-pay | Admitting: Emergency Medicine

## 2013-10-29 DIAGNOSIS — B86 Scabies: Secondary | ICD-10-CM

## 2013-10-29 LAB — HIV-1 RNA QUANT-NO REFLEX-BLD
HIV 1 RNA Quant: 838123 copies/mL — ABNORMAL HIGH (ref <20)
HIV-1 RNA Quant, Log: 5.92 {Log} — ABNORMAL HIGH (ref <1.30)

## 2013-10-29 LAB — T-HELPER CELL (CD4) - (RCID CLINIC ONLY)
CD4 % Helper T Cell: 8 % — ABNORMAL LOW (ref 33–55)
CD4 T Cell Abs: 70 /uL — ABNORMAL LOW (ref 400–2700)

## 2013-10-29 MED ORDER — PERMETHRIN 5 % EX CREA: TOPICAL_CREAM | CUTANEOUS | Status: DC

## 2013-10-29 MED ORDER — HYDROXYZINE HCL 25 MG PO TABS: 25.0000 mg | ORAL_TABLET | Freq: Four times a day (QID) | ORAL | Status: DC | PRN

## 2013-10-29 MED ORDER — PREDNISONE 20 MG PO TABS: 20.0000 mg | ORAL_TABLET | Freq: Two times a day (BID) | ORAL | Status: DC

## 2013-10-29 NOTE — ED Notes (Signed)
Pt c/o rash on bilateral arms onset 4 days Denies f/v/n/d, cold sx Went to [facility] ER yest for similar sx Alert w/no signs of acute distress.

## 2013-10-29 NOTE — Discharge Instructions (Signed)
Scabies is a rash caused by infestation with the mite Sarcoptes scabiei, a 0.3 mm mite that can burrow and deposit eggs in the the surface layer of the skin.  It is transmitted from other infected persons and often the entire family is infested even though they may not have symptoms.  The most common symptom is a very itchy rash.  Scabies can be treated by applying a cream with a medication call Permethrin.  An oral medication called Ivermectin is also available. ° °Here are the the instructions for treatment of scabies.  It is important to remember that all household members should be treated twice.  Once now, and once in 2 weeks. ° °· On the first night, shower, then apply the Elimite cream from head to toe.  This means on the scalp, face, armpits, hands and wrists, under the breasts, the naval, the genital area, the cleft between the buttocks, the feet and between the toes--everywhere on the body.  Do not miss even 1 square inch. °· Leave the cream on overnight, at least 8 hours. °· The next morning, shower again and scrub the Elimite off completely.  Clip the nails short and scrub under the nails with a toothbrush, since the mites can often live under the nails, then be transmitted to other parts of the body.  °· After that, strip the bed and wash sheets, pillow cases, pajamas, underwear and anything that has come in contact with your body in the past month in hot water.   °· Spray your matresses, chairs, couch, and furniture with RID spray which can be gotten over the counter at the drug store. °· Repeat this entire process in 1 week. ° °After the 2 applications, all the mites on your body should be dead.  It will take a while for the itching to go away, sometimes as much as a month.  The skin must rid itself of all the dead mites, eggs, and excreta.  You can use antihistamines such as Benadryl until this happens.  If the itching is severe, cortisone derivatives may help. ° °If the itching persists, it may be  that the rash is caused by something else, that your were reinfected or the Permethrin did not work in which case it would be reasonable to try the oral pill Ivermectin.  If you still have a rash or itching in 1 month, return to the Urgent Care Center or your primary care doctor for a recheck. ° °

## 2013-10-30 ENCOUNTER — Telehealth: Payer: Self-pay | Admitting: *Deleted

## 2013-10-30 NOTE — Telephone Encounter (Signed)
Will send an urgent [facility] referral for this patient. Andree Coss, RN

## 2013-10-30 NOTE — Telephone Encounter (Signed)
Message copied by Andree Coss on Wed Oct 30, 2013  8:48 AM ------      Message from: Daiva Eves, CORNELIUS N      Created: Tue Oct 29, 2013  7:13 PM       Need to find out what happened with [name], besides his getting drunk all the time and now being homeless. He had meds with him last time I saw him. I want to make sure he takes those and would like to have him potentially add in another drug such as isentress, tivicay to his current regimen to get his VL down quickly. Can we get him another appt with me in the next 1-2 weeks? ------

## 2013-10-30 NOTE — Progress Notes (Signed)
I will send an urgent bridge counseling referral. Andree Coss, RN

## 2013-11-18 NOTE — Telephone Encounter (Signed)
He will need to be seen [facility] every 2 weeks or so to make sure he is actually taking his ARV meds again and bringing virus under control

## 2013-11-18 NOTE — Telephone Encounter (Signed)
Patient has completed intake with [facility].  Case manager Amy Pecola Leisureeese.  Patient knows of his upcoming appointment August 12, 9:45. Pt in intensive outpatient treatment for alcohol abuse.  Per Marthann SchillerMitch, was sober and lucid during their conversation.  Pt is living with his mother at this time.  Pt would prefer to work with [facility] at this time. Andree CossHowell, Michelle M, RN

## 2013-11-19 NOTE — Telephone Encounter (Signed)
RN confirmed this with Amy at [facility].  He will be assigned a case manager today.

## 2013-11-22 NOTE — ED Provider Notes (Signed)
  Chief Complaint    Chief Complaint  Patient presents with  . Rash    History of Present Illness      [name] is a 58 year old male who presents with a four-day history of rash on his arms. This has been itchy. He has had no other rash and no other obvious exposures.  Review of Systems   Other than as noted above, the patient denies any of the following symptoms: Systemic:  No fever or chills. ENT:  No nasal congestion, rhinorrhea, sore throat, swelling of lips, tongue or throat. Resp:  No cough, wheezing, or shortness of breath.  PMFSH    Past medical history, family history, social history, meds, and allergies were reviewed.   Physical Exam     Vital signs:  BP 126/83  Pulse 60  Temp(Src) 97.8 F (36.6 C) (Oral)  Resp 15  SpO2 100% Gen:  Alert, oriented, in no distress. ENT:  Pharynx clear, no intraoral lesions, moist mucous membranes. Lungs:  Clear to auscultation. Skin:  He has scattered eczematous papules on both arms. Skin was otherwise clear.  Assessment    The encounter diagnosis was Scabies.  Plan     1.  Meds:  The following meds were prescribed:   Discharge Medication List as of 10/29/2013  2:17 PM    START taking these medications   Details  hydrOXYzine (ATARAX/VISTARIL) 25 MG tablet Take 1 tablet (25 mg total) by mouth every 6 (six) hours as needed for itching., Starting 10/29/2013, Until Discontinued, Normal    permethrin (ELIMITE) 5 % cream Apply head to toe at bedtime, including skin fold areas, hands, feet and between fingers and toes.  Leave on for at least 8 hours.  Scrub off next morning.  Repeat this same procedure in 1 week., Normal    predniSONE (DELTASONE) 20 MG tablet Take 1 tablet (20 mg total) by mouth 2 (two) times daily., Starting 10/29/2013, Until Discontinued, Normal        2.  Patient Education/Counseling:  The patient was given appropriate handouts, self care instructions, and instructed in symptomatic relief.    3.  Follow  up:  The patient was told to follow up here if no better in 3 to 4 days, or sooner if becoming worse in any way, and given some red flag symptoms such as worsening rash, fever, or difficulty breathing which would prompt immediate return.  Follow up here if necessary.      Reuben Likesavid C Aldred Mase, MD 11/22/13 [phone]

## 2013-11-26 ENCOUNTER — Emergency Department (HOSPITAL_COMMUNITY)
Admission: EM | Admit: 2013-11-26 | Discharge: 2013-11-26 | Payer: PRIVATE HEALTH INSURANCE | Attending: Emergency Medicine | Admitting: Emergency Medicine

## 2013-11-26 ENCOUNTER — Encounter (HOSPITAL_COMMUNITY): Payer: Self-pay | Admitting: Emergency Medicine

## 2013-11-26 DIAGNOSIS — IMO0002 Reserved for concepts with insufficient information to code with codable children: Secondary | ICD-10-CM | POA: Diagnosis not present

## 2013-11-26 DIAGNOSIS — Z87448 Personal history of other diseases of urinary system: Secondary | ICD-10-CM | POA: Diagnosis not present

## 2013-11-26 DIAGNOSIS — F329 Major depressive disorder, single episode, unspecified: Secondary | ICD-10-CM | POA: Insufficient documentation

## 2013-11-26 DIAGNOSIS — Z87828 Personal history of other (healed) physical injury and trauma: Secondary | ICD-10-CM | POA: Diagnosis not present

## 2013-11-26 DIAGNOSIS — J449 Chronic obstructive pulmonary disease, unspecified: Secondary | ICD-10-CM | POA: Diagnosis not present

## 2013-11-26 DIAGNOSIS — S0180XA Unspecified open wound of other part of head, initial encounter: Secondary | ICD-10-CM | POA: Insufficient documentation

## 2013-11-26 DIAGNOSIS — Z8614 Personal history of Methicillin resistant Staphylococcus aureus infection: Secondary | ICD-10-CM | POA: Diagnosis not present

## 2013-11-26 DIAGNOSIS — Z23 Encounter for immunization: Secondary | ICD-10-CM | POA: Diagnosis not present

## 2013-11-26 DIAGNOSIS — F3289 Other specified depressive episodes: Secondary | ICD-10-CM | POA: Insufficient documentation

## 2013-11-26 DIAGNOSIS — J4489 Other specified chronic obstructive pulmonary disease: Secondary | ICD-10-CM | POA: Insufficient documentation

## 2013-11-26 DIAGNOSIS — F17200 Nicotine dependence, unspecified, uncomplicated: Secondary | ICD-10-CM | POA: Insufficient documentation

## 2013-11-26 DIAGNOSIS — Z79899 Other long term (current) drug therapy: Secondary | ICD-10-CM | POA: Diagnosis not present

## 2013-11-26 DIAGNOSIS — Z8739 Personal history of other diseases of the musculoskeletal system and connective tissue: Secondary | ICD-10-CM | POA: Diagnosis not present

## 2013-11-26 DIAGNOSIS — S01111A Laceration without foreign body of right eyelid and periocular area, initial encounter: Secondary | ICD-10-CM

## 2013-11-26 DIAGNOSIS — Z21 Asymptomatic human immunodeficiency virus [HIV] infection status: Secondary | ICD-10-CM | POA: Diagnosis not present

## 2013-11-26 MED ORDER — TETANUS-DIPHTH-ACELL PERTUSSIS 5-2.5-18.5 LF-MCG/0.5 IM SUSP
0.5000 mL | Freq: Once | INTRAMUSCULAR | Status: AC
  Administered 2013-11-26: 0.5 mL via INTRAMUSCULAR
  Filled 2013-11-26: qty 0.5
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
## 2013-11-26 NOTE — Discharge Instructions (Signed)
Keep your wound clean and dry. Have your sutures out in 5-7 days.   Facial Laceration  A facial laceration is a cut on the face. These injuries can be painful and cause bleeding. Lacerations usually heal quickly, but they need special care to reduce scarring. DIAGNOSIS  Your health care provider will take a medical history, ask for details about how the injury occurred, and examine the wound to determine how deep the cut is. TREATMENT  Some facial lacerations may not require closure. Others may not be able to be closed because of an increased risk of infection. The risk of infection and the chance for successful closure will depend on various factors, including the amount of time since the injury occurred. The wound may be cleaned to help prevent infection. If closure is appropriate, pain medicines may be given if needed. Your health care provider will use stitches (sutures), wound glue (adhesive), or skin adhesive strips to repair the laceration. These tools bring the skin edges together to allow for faster healing and a better cosmetic outcome. If needed, you may also be given a tetanus shot. HOME CARE INSTRUCTIONS  Only take over-the-counter or prescription medicines as directed by your health care provider.  Follow your health care provider's instructions for wound care. These instructions will vary depending on the technique used for closing the wound. For Sutures:  Keep the wound clean and dry.   If you were given a bandage (dressing), you should change it at least once a day. Also change the dressing if it becomes wet or dirty, or as directed by your health care provider.   Wash the wound with soap and water 2 times a day. Rinse the wound off with water to remove all soap. Pat the wound dry with a clean towel.   After cleaning, apply a thin layer of the antibiotic ointment recommended by your health care provider. This will help prevent infection and keep the dressing from sticking.    You may shower as usual after the first 24 hours. Do not soak the wound in water until the sutures are removed.   Get your sutures removed as directed by your health care provider. With facial lacerations, sutures should usually be taken out after 4-5 days to avoid stitch marks.   Wait a few days after your sutures are removed before applying any makeup. For Skin Adhesive Strips:  Keep the wound clean and dry.   Do not get the skin adhesive strips wet. You may bathe carefully, using caution to keep the wound dry.   If the wound gets wet, pat it dry with a clean towel.   Skin adhesive strips will fall off on their own. You may trim the strips as the wound heals. Do not remove skin adhesive strips that are still stuck to the wound. They will fall off in time.  For Wound Adhesive:  You may briefly wet your wound in the shower or bath. Do not soak or scrub the wound. Do not swim. Avoid periods of heavy sweating until the skin adhesive has fallen off on its own. After showering or bathing, gently pat the wound dry with a clean towel.   Do not apply liquid medicine, cream medicine, ointment medicine, or makeup to your wound while the skin adhesive is in place. This may loosen the film before your wound is healed.   If a dressing is placed over the wound, be careful not to apply tape directly over the skin adhesive. This may  cause the adhesive to be pulled off before the wound is healed.   Avoid prolonged exposure to sunlight or tanning lamps while the skin adhesive is in place.  The skin adhesive will usually remain in place for 5-10 days, then naturally fall off the skin. Do not pick at the adhesive film.  After Healing: Once the wound has healed, cover the wound with sunscreen during the day for 1 full year. This can help minimize scarring. Exposure to ultraviolet light in the first year will darken the scar. It can take 1-2 years for the scar to lose its redness and to heal  completely.  SEEK IMMEDIATE MEDICAL CARE IF:  You have redness, pain, or swelling around the wound.   You see ayellowish-white fluid (pus) coming from the wound.   You have chills or a fever.  MAKE SURE YOU:  Understand these instructions.  Will watch your condition.  Will get help right away if you are not doing well or get worse. Document Released: 06/16/2004 Document Revised: 02/27/2013 Document Reviewed: 12/20/2012 [facility] Patient Information 2015 [employer] [place]. This information is not intended to replace advice given to you by your health care provider. Make sure you discuss any questions you have with your health care provider.

## 2013-11-27 NOTE — ED Provider Notes (Signed)
Medical screening examination/treatment/procedure(s) were performed by non-physician practitioner and as supervising physician I was immediately available for consultation/collaboration.    Lj Miyamoto, MD 11/27/13 0010 

## 2013-11-30 ENCOUNTER — Emergency Department (HOSPITAL_COMMUNITY): Payer: PRIVATE HEALTH INSURANCE

## 2013-11-30 ENCOUNTER — Emergency Department (HOSPITAL_COMMUNITY)
Admission: EM | Admit: 2013-11-30 | Discharge: 2013-11-30 | Disposition: A | Discharge status: Home / Self Care | Payer: PRIVATE HEALTH INSURANCE | Attending: Emergency Medicine | Admitting: Emergency Medicine

## 2013-11-30 ENCOUNTER — Encounter (HOSPITAL_COMMUNITY): Payer: Self-pay | Admitting: Emergency Medicine

## 2013-11-30 DIAGNOSIS — J4489 Other specified chronic obstructive pulmonary disease: Secondary | ICD-10-CM | POA: Insufficient documentation

## 2013-11-30 DIAGNOSIS — F3289 Other specified depressive episodes: Secondary | ICD-10-CM | POA: Insufficient documentation

## 2013-11-30 DIAGNOSIS — Z5900 Homelessness unspecified: Secondary | ICD-10-CM | POA: Diagnosis not present

## 2013-11-30 DIAGNOSIS — F10229 Alcohol dependence with intoxication, unspecified: Secondary | ICD-10-CM | POA: Insufficient documentation

## 2013-11-30 DIAGNOSIS — M86149 Other acute osteomyelitis, unspecified hand: Secondary | ICD-10-CM | POA: Insufficient documentation

## 2013-11-30 DIAGNOSIS — J449 Chronic obstructive pulmonary disease, unspecified: Secondary | ICD-10-CM | POA: Diagnosis not present

## 2013-11-30 DIAGNOSIS — Z79899 Other long term (current) drug therapy: Secondary | ICD-10-CM | POA: Diagnosis not present

## 2013-11-30 DIAGNOSIS — Z87828 Personal history of other (healed) physical injury and trauma: Secondary | ICD-10-CM | POA: Diagnosis not present

## 2013-11-30 DIAGNOSIS — R404 Transient alteration of awareness: Secondary | ICD-10-CM | POA: Insufficient documentation

## 2013-11-30 DIAGNOSIS — Z87448 Personal history of other diseases of urinary system: Secondary | ICD-10-CM | POA: Diagnosis not present

## 2013-11-30 DIAGNOSIS — F1010 Alcohol abuse, uncomplicated: Secondary | ICD-10-CM | POA: Insufficient documentation

## 2013-11-30 DIAGNOSIS — IMO0002 Reserved for concepts with insufficient information to code with codable children: Secondary | ICD-10-CM | POA: Insufficient documentation

## 2013-11-30 DIAGNOSIS — F10920 Alcohol use, unspecified with intoxication, uncomplicated: Secondary | ICD-10-CM

## 2013-11-30 DIAGNOSIS — Z21 Asymptomatic human immunodeficiency virus [HIV] infection status: Secondary | ICD-10-CM | POA: Diagnosis not present

## 2013-11-30 DIAGNOSIS — F329 Major depressive disorder, single episode, unspecified: Secondary | ICD-10-CM | POA: Insufficient documentation

## 2013-11-30 DIAGNOSIS — R4189 Other symptoms and signs involving cognitive functions and awareness: Secondary | ICD-10-CM

## 2013-11-30 DIAGNOSIS — Z8614 Personal history of Methicillin resistant Staphylococcus aureus infection: Secondary | ICD-10-CM | POA: Insufficient documentation

## 2013-11-30 DIAGNOSIS — R4182 Altered mental status, unspecified: Secondary | ICD-10-CM | POA: Diagnosis present

## 2013-11-30 LAB — CBC WITH DIFFERENTIAL/PLATELET
Basophils Absolute: 0 10*3/uL (ref 0.0–0.1)
Basophils Relative: 0 % (ref 0–1)
Eosinophils Absolute: 0.1 10*3/uL (ref 0.0–0.7)
Eosinophils Relative: 2 % (ref 0–5)
HCT: 34.6 % — ABNORMAL LOW (ref 39.0–52.0)
Hemoglobin: 11.2 g/dL — ABNORMAL LOW (ref 13.0–17.0)
Lymphocytes Relative: 43 % (ref 12–46)
Lymphs Abs: 2 10*3/uL (ref 0.7–4.0)
MCH: 31.3 pg (ref 26.0–34.0)
MCHC: 32.4 g/dL (ref 30.0–36.0)
MCV: 96.6 fL (ref 78.0–100.0)
Monocytes Absolute: 0.4 10*3/uL (ref 0.1–1.0)
Monocytes Relative: 9 % (ref 3–12)
Neutro Abs: 2.1 10*3/uL (ref 1.7–7.7)
Neutrophils Relative %: 46 % (ref 43–77)
Platelets: 130 10*3/uL — ABNORMAL LOW (ref 150–400)
RBC: 3.58 MIL/uL — ABNORMAL LOW (ref 4.22–5.81)
RDW: 13.9 % (ref 11.5–15.5)
WBC: 4.7 10*3/uL (ref 4.0–10.5)

## 2013-11-30 LAB — COMPREHENSIVE METABOLIC PANEL
ALT: 56 U/L — ABNORMAL HIGH (ref 0–53)
AST: 93 U/L — ABNORMAL HIGH (ref 0–37)
Albumin: 3.2 g/dL — ABNORMAL LOW (ref 3.5–5.2)
Alkaline Phosphatase: 61 U/L (ref 39–117)
Anion gap: 13 (ref 5–15)
BUN: 14 mg/dL (ref 6–23)
CO2: 24 mEq/L (ref 19–32)
Calcium: 8.5 mg/dL (ref 8.4–10.5)
Chloride: 106 mEq/L (ref 96–112)
Creatinine, Ser: 1.07 mg/dL (ref 0.50–1.35)
GFR calc Af Amer: 85 mL/min — ABNORMAL LOW (ref >90)
GFR calc non Af Amer: 74 mL/min — ABNORMAL LOW (ref >90)
Glucose, Bld: 90 mg/dL (ref 70–99)
Potassium: 4.2 mEq/L (ref 3.7–5.3)
Sodium: 143 mEq/L (ref 137–147)
Total Bilirubin: 0.7 mg/dL (ref 0.3–1.2)
Total Protein: 8.1 g/dL (ref 6.0–8.3)

## 2013-11-30 LAB — RAPID URINE DRUG SCREEN, HOSP PERFORMED
Amphetamines: NOT DETECTED
Barbiturates: NOT DETECTED
Benzodiazepines: NOT DETECTED
Cocaine: NOT DETECTED
Opiates: NOT DETECTED
Tetrahydrocannabinol: POSITIVE — AB

## 2013-11-30 LAB — ETHANOL: Alcohol, Ethyl (B): 171 mg/dL — ABNORMAL HIGH (ref 0–11)

## 2013-11-30 LAB — ACETAMINOPHEN LEVEL: Acetaminophen (Tylenol), Serum: <15 ug/mL (ref 10–30)

## 2013-11-30 LAB — SALICYLATE LEVEL: Salicylate Lvl: <2 mg/dL — ABNORMAL LOW (ref 2.8–20.0)

## 2013-11-30 NOTE — Discharge Instructions (Signed)
Alcohol Intoxication  Alcohol intoxication occurs when the amount of alcohol that a person has consumed impairs his or her ability to mentally and physically function. Alcohol directly impairs the normal chemical activity of the brain. Drinking large amounts of alcohol can lead to changes in mental function and behavior, and it can cause many physical effects that can be harmful.   Alcohol intoxication can range in severity from mild to very severe. Various factors can affect the level of intoxication that occurs, such as the person's age, gender, weight, frequency of alcohol consumption, and the presence of other medical conditions (such as diabetes, seizures, or heart conditions). Dangerous levels of alcohol intoxication may occur when people drink large amounts of alcohol in a short period (binge drinking). Alcohol can also be especially dangerous when combined with certain prescription medicines or "recreational" drugs.  SIGNS AND SYMPTOMS  Some common signs and symptoms of mild alcohol intoxication include:  · Loss of coordination.  · Changes in mood and behavior.  · Impaired judgment.  · Slurred speech.  As alcohol intoxication progresses to more severe levels, other signs and symptoms will appear. These may include:  · Vomiting.  · Confusion and impaired memory.  · Slowed breathing.  · Seizures.  · Loss of consciousness.  DIAGNOSIS   Your health care provider will take a medical history and perform a physical exam. You will be asked about the amount and type of alcohol you have consumed. Blood tests will be done to measure the concentration of alcohol in your blood. In many places, your blood alcohol level must be lower than 80 mg/dL (0.08%) to legally drive. However, many dangerous effects of alcohol can occur at much lower levels.   TREATMENT   People with alcohol intoxication often do not require treatment. Most of the effects of alcohol intoxication are temporary, and they go away as the alcohol naturally  leaves the body. Your health care provider will monitor your condition until you are stable enough to go home. Fluids are sometimes given through an IV access tube to help prevent dehydration.   HOME CARE INSTRUCTIONS  · Do not drive after drinking alcohol.  · Stay hydrated. Drink enough water and fluids to keep your urine clear or pale yellow. Avoid caffeine.    · Only take over-the-counter or prescription medicines as directed by your health care provider.    SEEK MEDICAL CARE IF:   · You have persistent vomiting.    · You do not feel better after a few days.  · You have frequent alcohol intoxication. Your health care provider can help determine if you should see a substance use treatment counselor.  SEEK IMMEDIATE MEDICAL CARE IF:   · You become shaky or tremble when you try to stop drinking.    · You shake uncontrollably (seizure).    · You throw up (vomit) blood. This may be bright red or may look like black coffee grounds.    · You have blood in your stool. This may be bright red or may appear as a black, tarry, bad smelling stool.    · You become lightheaded or faint.    MAKE SURE YOU:   · Understand these instructions.  · Will watch your condition.  · Will get help right away if you are not doing well or get worse.  Document Released: 02/16/2005 Document Revised: 01/09/2013 Document Reviewed: 10/12/2012  Exit[employer]® Patient Information ©2015 [employer]. This information is not intended to replace advice given to you by your health care provider. Make sure   you discuss any questions you have with your health care provider.

## 2013-11-30 NOTE — ED Notes (Signed)
Pt placed on monitor upon arrival to room. Pt monitored by 12 lead, blood pressure, and pulse ox. Pt remains on 2L [place]

## 2013-11-30 NOTE — ED Notes (Signed)
Pt states that he is not in any pain; however, will not answer any other assessment questions without becoming belligerent toward staff.  Pt asked to urinate and at that time was assisted to stand and give urinate in urinal.  Sample was taken and sent to lab.  Pt requested 3 sandwiches in which 2 was given; pt stated he had not eaten in 2 days.  Pt sat on sidet of bed under his own power and ate sandwiches and returned to sleep.
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
## 2013-11-30 NOTE — ED Notes (Signed)
Pt placed back on monitor upon arrival to room. Pt monitored by 5 lead, blood pressure, and pulse ox. Pt remains on 2L via [place]
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
## 2013-12-06 ENCOUNTER — Other Ambulatory Visit: Payer: Self-pay | Admitting: Internal Medicine

## 2013-12-07 ENCOUNTER — Emergency Department (HOSPITAL_COMMUNITY)
Admission: EM | Admit: 2013-12-07 | Discharge: 2013-12-08 | Disposition: A | Discharge status: Home / Self Care | Payer: PRIVATE HEALTH INSURANCE | Attending: Emergency Medicine | Admitting: Emergency Medicine

## 2013-12-07 ENCOUNTER — Encounter (HOSPITAL_COMMUNITY): Payer: Self-pay | Admitting: Emergency Medicine

## 2013-12-07 DIAGNOSIS — Z79899 Other long term (current) drug therapy: Secondary | ICD-10-CM | POA: Insufficient documentation

## 2013-12-07 DIAGNOSIS — F1010 Alcohol abuse, uncomplicated: Secondary | ICD-10-CM | POA: Insufficient documentation

## 2013-12-07 DIAGNOSIS — Z8614 Personal history of Methicillin resistant Staphylococcus aureus infection: Secondary | ICD-10-CM | POA: Insufficient documentation

## 2013-12-07 DIAGNOSIS — Z8739 Personal history of other diseases of the musculoskeletal system and connective tissue: Secondary | ICD-10-CM | POA: Diagnosis not present

## 2013-12-07 DIAGNOSIS — Z87448 Personal history of other diseases of urinary system: Secondary | ICD-10-CM | POA: Diagnosis not present

## 2013-12-07 DIAGNOSIS — J4489 Other specified chronic obstructive pulmonary disease: Secondary | ICD-10-CM | POA: Insufficient documentation

## 2013-12-07 DIAGNOSIS — J449 Chronic obstructive pulmonary disease, unspecified: Secondary | ICD-10-CM | POA: Insufficient documentation

## 2013-12-07 DIAGNOSIS — Z87828 Personal history of other (healed) physical injury and trauma: Secondary | ICD-10-CM | POA: Diagnosis not present

## 2013-12-07 DIAGNOSIS — R279 Unspecified lack of coordination: Secondary | ICD-10-CM | POA: Diagnosis not present

## 2013-12-07 DIAGNOSIS — F3289 Other specified depressive episodes: Secondary | ICD-10-CM | POA: Diagnosis not present

## 2013-12-07 DIAGNOSIS — F329 Major depressive disorder, single episode, unspecified: Secondary | ICD-10-CM | POA: Diagnosis not present

## 2013-12-07 DIAGNOSIS — F10920 Alcohol use, unspecified with intoxication, uncomplicated: Secondary | ICD-10-CM

## 2013-12-07 DIAGNOSIS — Z21 Asymptomatic human immunodeficiency virus [HIV] infection status: Secondary | ICD-10-CM | POA: Diagnosis not present

## 2013-12-07 DIAGNOSIS — IMO0002 Reserved for concepts with insufficient information to code with codable children: Secondary | ICD-10-CM | POA: Insufficient documentation

## 2013-12-07 DIAGNOSIS — F17200 Nicotine dependence, unspecified, uncomplicated: Secondary | ICD-10-CM | POA: Insufficient documentation

## 2013-12-07 HISTORY — DX: Bipolar disorder, unspecified: F31.9

## 2013-12-07 NOTE — ED Notes (Signed)
Dr Norlene Campbelltter at bedside. Patient is requesting alcohol detox. Patient has been advised we will need to be clinically sober and we will re-evaluate his desire for alcohol detox. Patient requesting a sandwich. Per Dr Norlene Campbelltter, patient may have water to drink, no food at this time. Patient with sutures to left eyebrow x11 days. Dr Norlene Campbelltter removing sutures at this time.

## 2013-12-07 NOTE — ED Notes (Signed)
Per EMS, [employer] [place] [address] [place]. When [employer] arrived patient stated he wanted detox for alcohol. Patient refused vitals with EMS, stating they were racist. Patient ambulatory on arrival to ER, states "I don't want nothing, I don't wanna be here". Off duty and security at bedside. Patient cursing.

## 2013-12-07 NOTE — ED Notes (Signed)
Patient is unable to states how much alcohol he has had tonight.

## 2013-12-08 ENCOUNTER — Encounter (HOSPITAL_COMMUNITY): Payer: Self-pay | Admitting: Emergency Medicine

## 2013-12-08 DIAGNOSIS — F1010 Alcohol abuse, uncomplicated: Secondary | ICD-10-CM | POA: Diagnosis not present

## 2013-12-08 LAB — COMPREHENSIVE METABOLIC PANEL
ALT: 50 U/L (ref 0–53)
AST: 92 U/L — ABNORMAL HIGH (ref 0–37)
Albumin: 3.1 g/dL — ABNORMAL LOW (ref 3.5–5.2)
Alkaline Phosphatase: 58 U/L (ref 39–117)
Anion gap: 11 (ref 5–15)
BUN: 11 mg/dL (ref 6–23)
CO2: 25 mEq/L (ref 19–32)
Calcium: 9.1 mg/dL (ref 8.4–10.5)
Chloride: 102 mEq/L (ref 96–112)
Creatinine, Ser: 0.95 mg/dL (ref 0.50–1.35)
GFR calc Af Amer: >90 mL/min (ref >90)
GFR calc non Af Amer: 89 mL/min — ABNORMAL LOW (ref >90)
Glucose, Bld: 84 mg/dL (ref 70–99)
Potassium: 3.4 mEq/L — ABNORMAL LOW (ref 3.7–5.3)
Sodium: 138 mEq/L (ref 137–147)
Total Bilirubin: 0.5 mg/dL (ref 0.3–1.2)
Total Protein: 8.1 g/dL (ref 6.0–8.3)

## 2013-12-08 LAB — SALICYLATE LEVEL: Salicylate Lvl: <2 mg/dL — ABNORMAL LOW (ref 2.8–20.0)

## 2013-12-08 LAB — CBC
HCT: 38.6 % — ABNORMAL LOW (ref 39.0–52.0)
Hemoglobin: 12.7 g/dL — ABNORMAL LOW (ref 13.0–17.0)
MCH: 32.2 pg (ref 26.0–34.0)
MCHC: 32.9 g/dL (ref 30.0–36.0)
MCV: 98 fL (ref 78.0–100.0)
Platelets: 121 10*3/uL — ABNORMAL LOW (ref 150–400)
RBC: 3.94 MIL/uL — ABNORMAL LOW (ref 4.22–5.81)
RDW: 13.9 % (ref 11.5–15.5)
WBC: 3.9 10*3/uL — ABNORMAL LOW (ref 4.0–10.5)

## 2013-12-08 LAB — ACETAMINOPHEN LEVEL: Acetaminophen (Tylenol), Serum: <15 ug/mL (ref 10–30)

## 2013-12-08 LAB — ETHANOL: Alcohol, Ethyl (B): 88 mg/dL — ABNORMAL HIGH (ref 0–11)

## 2013-12-08 NOTE — BH Assessment (Signed)
Tele-assessment completed. Consulted with Maryjean Mornharles Kober, PA-C who recommended that [facility] due to not meeting inpatient criteria. Dr. Norlene Campbelltter has been notified of recommendation.

## 2013-12-08 NOTE — ED Notes (Signed)
TTS called for tele assessment

## 2013-12-08 NOTE — ED Notes (Signed)
Patient given water

## 2013-12-08 NOTE — BH Assessment (Signed)
Received a call for an assessment. Spoke with Dr. Norlene Campbelltter who reported that patient was picked up by [employer] from a local gas station. Pt is requesting alcohol detox. Pt is denying SI/HI at this time. Assessment will be initiated.

## 2013-12-08 NOTE — Consult Note (Signed)
  TTS Review-Pt reportedly disabled Veteran seeking help with alcoholism.No HI no SI currently homeless-recommend he go [facility] [place] and request treatment.They will also help with his homelessness

## 2013-12-08 NOTE — Discharge Instructions (Signed)
Alcohol Intoxication Alcohol intoxication occurs when the amount of alcohol that a person has consumed impairs his or her ability to mentally and physically function. Alcohol directly impairs the normal chemical activity of the brain. Drinking large amounts of alcohol can lead to changes in mental function and behavior, and it can cause many physical effects that can be harmful.  Alcohol intoxication can range in severity from mild to very severe. Various factors can affect the level of intoxication that occurs, such as the person's age, gender, weight, frequency of alcohol consumption, and the presence of other medical conditions (such as diabetes, seizures, or heart conditions). Dangerous levels of alcohol intoxication may occur when people drink large amounts of alcohol in a short period (binge drinking). Alcohol can also be especially dangerous when combined with certain prescription medicines or "recreational" drugs. SIGNS AND SYMPTOMS Some common signs and symptoms of mild alcohol intoxication include:  Loss of coordination.  Changes in mood and behavior.  Impaired judgment.  Slurred speech. As alcohol intoxication progresses to more severe levels, other signs and symptoms will appear. These may include:  Vomiting.  Confusion and impaired memory.  Slowed breathing.  Seizures.  Loss of consciousness. DIAGNOSIS  Your health care provider will take a medical history and perform a physical exam. You will be asked about the amount and type of alcohol you have consumed. Blood tests will be done to measure the concentration of alcohol in your blood. In many places, your blood alcohol level must be lower than 80 mg/dL (1.61%) to legally drive. However, many dangerous effects of alcohol can occur at much lower levels.  TREATMENT  People with alcohol intoxication often do not require treatment. Most of the effects of alcohol intoxication are temporary, and they go away as the alcohol naturally  leaves the body. Your health care provider will monitor your condition until you are stable enough to go home. Fluids are sometimes given through an IV access tube to help prevent dehydration.  HOME CARE INSTRUCTIONS  Do not drive after drinking alcohol.  Stay hydrated. Drink enough water and fluids to keep your urine clear or pale yellow. Avoid caffeine.   Only take over-the-counter or prescription medicines as directed by your health care provider.  SEEK MEDICAL CARE IF:   You have persistent vomiting.   You do not feel better after a few days.  You have frequent alcohol intoxication. Your health care provider can help determine if you should see a substance use treatment counselor. SEEK IMMEDIATE MEDICAL CARE IF:   You become shaky or tremble when you try to stop drinking.   You shake uncontrollably (seizure).   You throw up (vomit) blood. This may be bright red or may look like black coffee grounds.   You have blood in your stool. This may be bright red or may appear as a black, tarry, bad smelling stool.   You become lightheaded or faint.  MAKE SURE YOU:   Understand these instructions.  Will watch your condition.  Will get help right away if you are not doing well or get worse. Document Released: 02/16/2005 Document Revised: 01/09/2013 Document Reviewed: 10/12/2012 [facility] Patient Information 2015 [place], [place]. This information is not intended to replace advice given to you by your health care provider. Make sure you discuss any questions you have with your health care provider.  Alcohol Problems Most adults who drink alcohol drink in moderation (not a lot) are at low risk for developing problems related to their drinking. However, all  drinkers, including low-risk drinkers, should know about the health risks connected with drinking alcohol. RECOMMENDATIONS FOR LOW-RISK DRINKING  Drink in moderation. Moderate drinking is defined as follows:   Men - no more  than 2 drinks per day.  Nonpregnant women - no more than 1 drink per day.  Over age 63 - no more than 1 drink per day. A standard drink is 12 grams of pure alcohol, which is equal to a 12 ounce bottle of beer or wine cooler, a 5 ounce glass of wine, or 1.5 ounces of distilled spirits (such as whiskey, brandy, vodka, or rum).  ABSTAIN FROM (DO NOT DRINK) ALCOHOL:  When pregnant or considering pregnancy.  When taking a medication that interacts with alcohol.  If you are alcohol dependent.  A medical condition that prohibits drinking alcohol (such as ulcer, liver disease, or heart disease). DISCUSS WITH YOUR CAREGIVER:  If you are at risk for coronary heart disease, discuss the potential benefits and risks of alcohol use: Light to moderate drinking is associated with lower rates of coronary heart disease in certain populations (for example, men over age 56 and postmenopausal women). Infrequent or nondrinkers are advised not to begin light to moderate drinking to reduce the risk of coronary heart disease so as to avoid creating an alcohol-related problem. Similar protective effects can likely be gained through proper diet and exercise.  Women and the elderly have smaller amounts of body water than men. As a result women and the elderly achieve a higher blood alcohol concentration after drinking the same amount of alcohol.  Exposing a fetus to alcohol can cause a broad range of birth defects referred to as Fetal Alcohol Syndrome (FAS) or Alcohol-Related Birth Defects (ARBD). Although FAS/ARBD is connected with excessive alcohol consumption during pregnancy, studies also have reported neurobehavioral problems in infants born to mothers reporting drinking an average of 1 drink per day during pregnancy.  Heavier drinking (the consumption of more than 4 drinks per occasion by men and more than 3 drinks per occasion by women) impairs learning (cognitive) and psychomotor functions and increases the risk  of alcohol-related problems, including accidents and injuries. CAGE QUESTIONS:   Have you ever felt that you should Cut down on your drinking?  Have people Annoyed you by criticizing your drinking?  Have you ever felt bad or Guilty about your drinking?  Have you ever had a drink first thing in the morning to steady your nerves or get rid of a hangover (Eye opener)? If you answered positively to any of these questions: You may be at risk for alcohol-related problems if alcohol consumption is:   Men: Greater than 14 drinks per week or more than 4 drinks per occasion.  Women: Greater than 7 drinks per week or more than 3 drinks per occasion. Do you or your family have a medical history of alcohol-related problems, such as:  Blackouts.  Sexual dysfunction.  Depression.  Trauma.  Liver dysfunction.  Sleep disorders.  Hypertension.  Chronic abdominal pain.  Has your drinking ever caused you problems, such as problems with your family, problems with your work (or school) performance, or accidents/injuries?  Do you have a compulsion to drink or a preoccupation with drinking?  Do you have poor control or are you unable to stop drinking once you have started?  Do you have to drink to avoid withdrawal symptoms?  Do you have problems with withdrawal such as tremors, nausea, sweats, or mood disturbances?  Does it take more alcohol than  in the past to get you high?  Do you feel a strong urge to drink?  Do you change your plans so that you can have a drink?  Do you ever drink in the morning to relieve the shakes or a hangover? If you have answered a number of the previous questions positively, it may be time for you to talk to your caregivers, family, and friends and see if they think you have a problem. Alcoholism is a chemical dependency that keeps getting worse and will eventually destroy your health and relationships. Many alcoholics end up dead, impoverished, or in prison.  This is often the end result of all chemical dependency.  Do not be discouraged if you are not ready to take action immediately.  Decisions to change behavior often involve up and down desires to change and feeling like you cannot decide.  Try to think more seriously about your drinking behavior.  Think of the reasons to quit. WHERE TO GO FOR ADDITIONAL INFORMATION   The National Institute on Alcohol Abuse and Alcoholism (NIAAA) BasicStudents.dkwww.niaaa.nih.gov  [employer] on Alcoholism and Drug Dependence (NCADD) www.ncadd.org  American Society of Addiction Medicine (ASAM) RoyalDiary.glwww.asam.org  Document Released: 05/09/2005 Document Revised: 08/01/2011 Document Reviewed: 12/26/2007 [facility] Patient Information 2015 [place], [place]. This information is not intended to replace advice given to you by your health care provider. Make sure you discuss any questions you have with your health care provider.
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
## 2013-12-09 NOTE — Consult Note (Signed)
Agree with plan as patient's not suicidal or homicidal

## 2014-01-01 ENCOUNTER — Encounter: Payer: Self-pay | Admitting: Infectious Disease

## 2014-01-01 ENCOUNTER — Ambulatory Visit (INDEPENDENT_AMBULATORY_CARE_PROVIDER_SITE_OTHER): Payer: PRIVATE HEALTH INSURANCE | Admitting: Infectious Disease

## 2014-01-01 VITALS — BP 124/84 | HR 75 | Temp 98.2°F | Wt 207.0 lb

## 2014-01-01 DIAGNOSIS — K259 Gastric ulcer, unspecified as acute or chronic, without hemorrhage or perforation: Secondary | ICD-10-CM

## 2014-01-01 DIAGNOSIS — F1010 Alcohol abuse, uncomplicated: Secondary | ICD-10-CM

## 2014-01-01 DIAGNOSIS — Z113 Encounter for screening for infections with a predominantly sexual mode of transmission: Secondary | ICD-10-CM

## 2014-01-01 DIAGNOSIS — T3995XA Adverse effect of unspecified nonopioid analgesic, antipyretic and antirheumatic, initial encounter: Secondary | ICD-10-CM

## 2014-01-01 DIAGNOSIS — T398X5A Adverse effect of other nonopioid analgesics and antipyretics, not elsewhere classified, initial encounter: Secondary | ICD-10-CM

## 2014-01-01 DIAGNOSIS — F1490 Cocaine use, unspecified, uncomplicated: Secondary | ICD-10-CM

## 2014-01-01 DIAGNOSIS — G609 Hereditary and idiopathic neuropathy, unspecified: Secondary | ICD-10-CM

## 2014-01-01 DIAGNOSIS — M545 Low back pain, unspecified: Secondary | ICD-10-CM

## 2014-01-01 DIAGNOSIS — F321 Major depressive disorder, single episode, moderate: Secondary | ICD-10-CM

## 2014-01-01 DIAGNOSIS — B20 Human immunodeficiency virus [HIV] disease: Secondary | ICD-10-CM

## 2014-01-01 DIAGNOSIS — D696 Thrombocytopenia, unspecified: Secondary | ICD-10-CM

## 2014-01-01 DIAGNOSIS — B182 Chronic viral hepatitis C: Secondary | ICD-10-CM

## 2014-01-01 DIAGNOSIS — F1410 Cocaine abuse, uncomplicated: Secondary | ICD-10-CM

## 2014-01-01 DIAGNOSIS — K253 Acute gastric ulcer without hemorrhage or perforation: Secondary | ICD-10-CM

## 2014-01-01 LAB — RPR

## 2014-01-01 LAB — COMPLETE METABOLIC PANEL WITH GFR
ALT: 49 U/L (ref 0–53)
AST: 74 U/L — ABNORMAL HIGH (ref 0–37)
Albumin: 3.4 g/dL — ABNORMAL LOW (ref 3.5–5.2)
Alkaline Phosphatase: 46 U/L (ref 39–117)
BUN: 12 mg/dL (ref 6–23)
CO2: 28 mEq/L (ref 19–32)
Calcium: 9.3 mg/dL (ref 8.4–10.5)
Chloride: 101 mEq/L (ref 96–112)
Creat: 0.96 mg/dL (ref 0.50–1.35)
GFR, Est African American: >89 mL/min
GFR, Est Non African American: 86 mL/min
Glucose, Bld: 81 mg/dL (ref 70–99)
Potassium: 3.8 mEq/L (ref 3.5–5.3)
Sodium: 136 mEq/L (ref 135–145)
Total Bilirubin: 0.8 mg/dL (ref 0.2–1.2)
Total Protein: 8.3 g/dL (ref 6.0–8.3)

## 2014-01-01 LAB — CBC WITH DIFFERENTIAL/PLATELET
Basophils Absolute: 0 10*3/uL (ref 0.0–0.1)
Basophils Relative: 0 % (ref 0–1)
Eosinophils Absolute: 0.1 10*3/uL (ref 0.0–0.7)
Eosinophils Relative: 3 % (ref 0–5)
HCT: 40.9 % (ref 39.0–52.0)
Hemoglobin: 13.4 g/dL (ref 13.0–17.0)
Lymphocytes Relative: 24 % (ref 12–46)
Lymphs Abs: 1.2 10*3/uL (ref 0.7–4.0)
MCH: 31.6 pg (ref 26.0–34.0)
MCHC: 32.8 g/dL (ref 30.0–36.0)
MCV: 96.5 fL (ref 78.0–100.0)
Monocytes Absolute: 0.4 10*3/uL (ref 0.1–1.0)
Monocytes Relative: 9 % (ref 3–12)
Neutro Abs: 3.1 10*3/uL (ref 1.7–7.7)
Neutrophils Relative %: 64 % (ref 43–77)
Platelets: 158 10*3/uL (ref 150–400)
RBC: 4.24 MIL/uL (ref 4.22–5.81)
RDW: 13.9 % (ref 11.5–15.5)
WBC: 4.8 10*3/uL (ref 4.0–10.5)

## 2014-01-01 MED ORDER — ABACAVIR-DOLUTEGRAVIR-LAMIVUD 600-50-300 MG PO TABS
1.0000 | ORAL_TABLET | Freq: Every day | ORAL | Status: DC
Start: 2014-01-01 — End: 2015-01-20

## 2014-01-01 MED ORDER — CITALOPRAM HYDROBROMIDE 40 MG PO TABS: 40.0000 mg | ORAL_TABLET | Freq: Every day | ORAL | Status: DC

## 2014-01-01 MED ORDER — ATOVAQUONE 750 MG/5ML PO SUSP: 1500.0000 mg | Freq: Every day | ORAL | Status: DC

## 2014-01-01 NOTE — Addendum Note (Signed)
Addended by: Starleen Arms D on: 01/01/2014 10:50 AM   Modules accepted: Orders

## 2014-01-01 NOTE — Addendum Note (Signed)
Addended by: Mariea ClontsGREEN, Shakiah Wester D on: 01/01/2014 10:54 AM   Modules accepted: Orders

## 2014-01-01 NOTE — Patient Instructions (Signed)
Until  You can get your ne m

## 2014-01-01 NOTE — Progress Notes (Signed)
Subjective:    Patient ID: [name], male    DOB: 07/23/1955, 58 y.o.   MRN: [id number]  HPI   58 year old ever American man with HIV who had been incarcerated. 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 He ultimately had been [facility] where he had suffered from MRSA bacteremia. He was treated with IV antibiotics and ultimately discharged and establish care [facility].   His HIV has since been perfectly controlled with a viral load of less than 20 to 83 Epzicom Prezista and Norvir and then changed to  prezcobix and epzicom.   His chaotic life this year with multiple admissions to the psychiatric hospital for depression and alcohol abuse.  His are load has skyrocketed to above 800,000 copies and his CD4 count remains low when checked in June. He states he has been persistently been taking his meds for at least the last 2 weeks although he also is but states that maybe he would not getting his medicines while in the psychiatric hospital. He is no longer homeless having found a place to stay with a friend of his.   He is still suffering from depressions has run out of his antidepressants. He also complains of lower back pain and asked for narcotics for this.          Review of Systems  Respiratory: Negative for cough, chest tightness, shortness of breath, wheezing and stridor.   Cardiovascular: Negative for palpitations and leg swelling.  Gastrointestinal: Negative for nausea, vomiting, abdominal pain, diarrhea, constipation and abdominal distention.  Genitourinary: Negative for dysuria, hematuria, flank pain and difficulty urinating.  Musculoskeletal: Negative for arthralgias, back pain, gait problem, joint swelling and myalgias.  Skin: Negative for color change, pallor, rash and wound.  Neurological: Positive for dizziness. Negative for tremors and light-headedness.  Hematological: Negative for  adenopathy. Does not bruise/bleed easily.  Psychiatric/Behavioral: Positive for behavioral problems, dysphoric mood and decreased concentration. Negative for suicidal ideas, confusion, sleep disturbance and agitation.       Objective:   Physical Exam  Constitutional: He is oriented to person, place, and time. He appears well-developed and well-nourished. No distress.  HENT:  Head: Normocephalic and atraumatic.  Mouth/Throat: No oropharyngeal exudate.  Eyes: Conjunctivae and EOM are normal.  Neck: Normal range of motion. Neck supple.  Cardiovascular: Normal rate and regular rhythm.   Pulmonary/Chest: Effort normal. He has no wheezes.  Abdominal: He exhibits no distension.  Musculoskeletal: He exhibits no edema and no tenderness.  Lymphadenopathy:    He has no cervical adenopathy.  Neurological: He is alert and oriented to person, place, and time. He exhibits normal muscle tone. Coordination normal.  Skin: Skin is warm and dry. He is not diaphoretic. No erythema.  Psychiatric: His behavior is normal. Judgment and thought content normal. He exhibits a depressed mood.          Assessment & Plan:   #1 HIV-AIDS: Switch to TRIUMEQ check viral load with genotype today CD4 count basic labs. 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 He desperately needs therapy. However I don't want to go through the painful process of getting medications if he can't show himself to be compliant. He also is clear and he does be sober for the sake of his liver. I would treat him for the hep C regardless  whether he is using alcohol that his insurance company may feel otherwise.  #3 Crack cocaine and etoh abuse: relapsing at times really needs to get into a stable long-term treatment plan.  #4 depression we will renew his Celexa and trazodone but not the Abilify.  #5  number back pain can use nonsteroidals will not give him  prescription narcotics given his polysubstance abuse history

## 2014-01-02 ENCOUNTER — Other Ambulatory Visit: Payer: Self-pay | Admitting: Infectious Disease

## 2014-01-02 LAB — HIV-1 RNA QUANT-NO REFLEX-BLD
HIV 1 RNA Quant: 1446 copies/mL — ABNORMAL HIGH (ref <20)
HIV-1 RNA Quant, Log: 3.16 {Log} — ABNORMAL HIGH (ref <1.30)

## 2014-01-02 LAB — T-HELPER CELL (CD4) - (RCID CLINIC ONLY)
CD4 % Helper T Cell: 8 % — ABNORMAL LOW (ref 33–55)
CD4 T Cell Abs: 100 /uL — ABNORMAL LOW (ref 400–2700)

## 2014-01-13 ENCOUNTER — Encounter (HOSPITAL_COMMUNITY): Payer: Self-pay | Admitting: Emergency Medicine

## 2014-01-13 ENCOUNTER — Emergency Department (HOSPITAL_COMMUNITY): Payer: PRIVATE HEALTH INSURANCE

## 2014-01-13 ENCOUNTER — Emergency Department (HOSPITAL_COMMUNITY)
Admission: EM | Admit: 2014-01-13 | Discharge: 2014-01-13 | Disposition: A | Discharge status: Home / Self Care | Payer: PRIVATE HEALTH INSURANCE | Attending: Emergency Medicine | Admitting: Emergency Medicine

## 2014-01-13 DIAGNOSIS — F319 Bipolar disorder, unspecified: Secondary | ICD-10-CM | POA: Diagnosis not present

## 2014-01-13 DIAGNOSIS — Z791 Long term (current) use of non-steroidal anti-inflammatories (NSAID): Secondary | ICD-10-CM | POA: Insufficient documentation

## 2014-01-13 DIAGNOSIS — Z21 Asymptomatic human immunodeficiency virus [HIV] infection status: Secondary | ICD-10-CM | POA: Diagnosis not present

## 2014-01-13 DIAGNOSIS — Z87448 Personal history of other diseases of urinary system: Secondary | ICD-10-CM | POA: Insufficient documentation

## 2014-01-13 DIAGNOSIS — R079 Chest pain, unspecified: Secondary | ICD-10-CM | POA: Diagnosis present

## 2014-01-13 DIAGNOSIS — J449 Chronic obstructive pulmonary disease, unspecified: Secondary | ICD-10-CM | POA: Insufficient documentation

## 2014-01-13 DIAGNOSIS — R0789 Other chest pain: Secondary | ICD-10-CM

## 2014-01-13 DIAGNOSIS — E860 Dehydration: Secondary | ICD-10-CM | POA: Diagnosis not present

## 2014-01-13 DIAGNOSIS — J4489 Other specified chronic obstructive pulmonary disease: Secondary | ICD-10-CM | POA: Insufficient documentation

## 2014-01-13 DIAGNOSIS — Z8739 Personal history of other diseases of the musculoskeletal system and connective tissue: Secondary | ICD-10-CM | POA: Diagnosis not present

## 2014-01-13 DIAGNOSIS — R071 Chest pain on breathing: Secondary | ICD-10-CM | POA: Insufficient documentation

## 2014-01-13 DIAGNOSIS — F17200 Nicotine dependence, unspecified, uncomplicated: Secondary | ICD-10-CM | POA: Diagnosis not present

## 2014-01-13 DIAGNOSIS — Z79899 Other long term (current) drug therapy: Secondary | ICD-10-CM | POA: Insufficient documentation

## 2014-01-13 DIAGNOSIS — I498 Other specified cardiac arrhythmias: Secondary | ICD-10-CM | POA: Insufficient documentation

## 2014-01-13 DIAGNOSIS — Z8614 Personal history of Methicillin resistant Staphylococcus aureus infection: Secondary | ICD-10-CM | POA: Diagnosis not present

## 2014-01-13 DIAGNOSIS — Z87828 Personal history of other (healed) physical injury and trauma: Secondary | ICD-10-CM | POA: Insufficient documentation

## 2014-01-13 DIAGNOSIS — R112 Nausea with vomiting, unspecified: Secondary | ICD-10-CM | POA: Insufficient documentation

## 2014-01-13 LAB — COMPREHENSIVE METABOLIC PANEL
ALT: 34 U/L (ref 0–53)
AST: 46 U/L — ABNORMAL HIGH (ref 0–37)
Albumin: 3.1 g/dL — ABNORMAL LOW (ref 3.5–5.2)
Alkaline Phosphatase: 60 U/L (ref 39–117)
Anion gap: 10 (ref 5–15)
BUN: 17 mg/dL (ref 6–23)
CO2: 22 mEq/L (ref 19–32)
Calcium: 8.8 mg/dL (ref 8.4–10.5)
Chloride: 104 mEq/L (ref 96–112)
Creatinine, Ser: 1.12 mg/dL (ref 0.50–1.35)
GFR calc Af Amer: 81 mL/min — ABNORMAL LOW (ref >90)
GFR calc non Af Amer: 70 mL/min — ABNORMAL LOW (ref >90)
Glucose, Bld: 89 mg/dL (ref 70–99)
Potassium: 4.3 mEq/L (ref 3.7–5.3)
Sodium: 136 mEq/L — ABNORMAL LOW (ref 137–147)
Total Bilirubin: 0.7 mg/dL (ref 0.3–1.2)
Total Protein: 8.3 g/dL (ref 6.0–8.3)

## 2014-01-13 LAB — CBC WITH DIFFERENTIAL/PLATELET
Basophils Absolute: 0 10*3/uL (ref 0.0–0.1)
Basophils Relative: 0 % (ref 0–1)
Eosinophils Absolute: 0.1 10*3/uL (ref 0.0–0.7)
Eosinophils Relative: 1 % (ref 0–5)
HCT: 41.2 % (ref 39.0–52.0)
Hemoglobin: 13.8 g/dL (ref 13.0–17.0)
Lymphocytes Relative: 12 % (ref 12–46)
Lymphs Abs: 0.8 10*3/uL (ref 0.7–4.0)
MCH: 31.5 pg (ref 26.0–34.0)
MCHC: 33.5 g/dL (ref 30.0–36.0)
MCV: 94.1 fL (ref 78.0–100.0)
Monocytes Absolute: 0.5 10*3/uL (ref 0.1–1.0)
Monocytes Relative: 8 % (ref 3–12)
Neutro Abs: 5.2 10*3/uL (ref 1.7–7.7)
Neutrophils Relative %: 79 % — ABNORMAL HIGH (ref 43–77)
Platelets: 136 10*3/uL — ABNORMAL LOW (ref 150–400)
RBC: 4.38 MIL/uL (ref 4.22–5.81)
RDW: 12.5 % (ref 11.5–15.5)
WBC: 6.6 10*3/uL (ref 4.0–10.5)

## 2014-01-13 LAB — I-STAT TROPONIN, ED: Troponin i, poc: 0 ng/mL (ref 0.00–0.08)

## 2014-01-13 LAB — MAGNESIUM: Magnesium: 1.7 mg/dL (ref 1.5–2.5)

## 2014-01-13 LAB — PRO B NATRIURETIC PEPTIDE: Pro B Natriuretic peptide (BNP): 162 pg/mL — ABNORMAL HIGH (ref 0–125)

## 2014-01-13 MED ORDER — ONDANSETRON 4 MG PO TBDP: 4.0000 mg | ORAL_TABLET | Freq: Three times a day (TID) | ORAL | Status: DC | PRN

## 2014-01-13 MED ORDER — NAPROXEN 500 MG PO TABS: 500.0000 mg | ORAL_TABLET | Freq: Two times a day (BID) | ORAL | Status: DC | PRN

## 2014-01-13 MED ORDER — SODIUM CHLORIDE 0.9 % IV BOLUS (SEPSIS)
1000.0000 mL | Freq: Once | INTRAVENOUS | Status: AC
  Administered 2014-01-13: 1000 mL via INTRAVENOUS

## 2014-01-13 NOTE — ED Provider Notes (Signed)
Medical screening examination/treatment/procedure(s) were conducted as a shared visit with non-physician practitioner(s) and myself.  I personally evaluated the patient during the encounter.   EKG Interpretation   Date/Time:  Monday January 13 2014 12:27:16 EDT Ventricular Rate:  58 PR Interval:  140 QRS Duration: 82 QT Interval:  452 QTC Calculation: 443 R Axis:   62 Text Interpretation:  Sinus bradycardia T wave abnormality, consider  inferolateral ischemia Abnormal ECG No significant change since last  tracing Confirmed by Hildred Mollica  MD, Torii Royse [phone]) on 01/13/2014  12:53:11 PM      Presents today for evaluation of chest pain. Patient has been experiencing pain for 2 days. Patient reports pain in the left upper chest area which is sharp in nature. It worsened today while walking outside. I believe this is secondary to increased respiratory rate, as the patient has significant point tenderness at this site. In addition to tenderness, it hurts with movement. This completely reproduces the pain he has been experiencing. His cardiac workup was negative. After 2 days he has no EKG changes, and no troponin elevation. With his examination being consistent with musculoskeletal pain, I do not feel he requires hospitalization for further management at this time.  Gilda Crease, MD 01/13/14 1455

## 2014-01-13 NOTE — Discharge Instructions (Signed)
Your chest pain might be due to lack of drinking water, along with the heat, and maybe a muscle strain in your chest wall. STAY WELL HYDRATED, and stop using heroin, cocaine, and any other illicit drugs. Use naprosyn for pain, and zofran for nausea. See the cardiologist listed above for ongoing management of your chest pain. See your regular doctor in 3 days for a re-check. Return to the ER for any changes or worsening symptoms.    Dehydration, Adult Dehydration means your body does not have as much fluid as it needs. Your kidneys, brain, and heart will not work properly without the right amount of fluids and salt.  HOME CARE  Ask your doctor how to replace body fluid losses (rehydrate).  Drink enough fluids to keep your pee (urine) clear or pale yellow.  Drink small amounts of fluids often if you feel sick to your stomach (nauseous) or throw up (vomit).  Eat like you normally do.  Avoid:  Foods or drinks high in sugar.  Bubbly (carbonated) drinks.  Juice.  Very hot or cold fluids.  Drinks with caffeine.  Fatty, greasy foods.  Alcohol.  Tobacco.  Eating too much.  Gelatin desserts.  Wash your hands to avoid spreading germs (bacteria, viruses).  Only take medicine as told by your doctor.  Keep all doctor visits as told. GET HELP RIGHT AWAY IF:   You cannot drink something without throwing up.  You get worse even with treatment.  Your vomit has blood in it or looks greenish.  Your poop (stool) has blood in it or looks black and tarry.  You have not peed in 6 to 8 hours.  You pee a small amount of very dark pee.  You have a fever.  You pass out (faint).  You have belly (abdominal) pain that gets worse or stays in one spot (localizes).  You have a rash, stiff neck, or bad headache.  You get easily annoyed, sleepy, or are hard to wake up.  You feel weak, dizzy, or very thirsty. MAKE SURE YOU:   Understand these instructions.  Will watch your  condition.  Will get help right away if you are not doing well or get worse. Document Released: 03/05/2009 Document Revised: 08/01/2011 Document Reviewed: 12/27/2010 [facility] Patient Information 2015 [place], [place]. This information is not intended to replace advice given to you by your health care provider. Make sure you discuss any questions you have with your health care provider.  Nausea and Vomiting Nausea means you feel sick to your stomach. Throwing up (vomiting) is a reflex where stomach contents come out of your mouth. HOME CARE   Take medicine as told by your doctor.  Do not force yourself to eat. However, you do need to drink fluids.  If you feel like eating, eat a normal diet as told by your doctor.  Eat rice, wheat, potatoes, bread, lean meats, yogurt, fruits, and vegetables.  Avoid high-fat foods.  Drink enough fluids to keep your pee (urine) clear or pale yellow.  Ask your doctor how to replace body fluid losses (rehydrate). Signs of body fluid loss (dehydration) include:  Feeling very thirsty.  Dry lips and mouth.  Feeling dizzy.  Dark pee.  Peeing less than normal.  Feeling confused.  Fast breathing or heart rate. GET HELP RIGHT AWAY IF:   You have blood in your throw up.  You have black or bloody poop (stool).  You have a bad headache or stiff neck.  You feel confused.  You have bad belly (abdominal) pain.  You have chest pain or trouble breathing.  You do not pee at least once every 8 hours.  You have cold, clammy skin.  You keep throwing up after 24 to 48 hours.  You have a fever. MAKE SURE YOU:   Understand these instructions.  Will watch your condition.  Will get help right away if you are not doing well or get worse. Document Released: 10/26/2007 Document Revised: 08/01/2011 Document Reviewed: 10/08/2010 [facility] Patient Information 2015 [place], [place]. This information is not intended to replace advice given to you by your  health care provider. Make sure you discuss any questions you have with your health care provider.  Chest Wall Pain Chest wall pain is pain felt in or around the chest bones and muscles. It may take up to 6 weeks to get better. It may take longer if you are active. Chest wall pain can happen on its own. Other times, things like germs, injury, coughing, or exercise can cause the pain. HOME CARE   Avoid activities that make you tired or cause pain. Try not to use your chest, belly (abdominal), or side muscles. Do not use heavy weights.  Put ice on the sore area.  Put ice in a plastic bag.  Place a towel between your skin and the bag.  Leave the ice on for 15-20 minutes for the first 2 days.  Only take medicine as told by your doctor. GET HELP RIGHT AWAY IF:   You have more pain or are very uncomfortable.  You have a fever.  Your chest pain gets worse.  You have new problems.  You feel sick to your stomach (nauseous) or throw up (vomit).  You start to sweat or feel lightheaded.  You have a cough with mucus (phlegm).  You cough up blood. MAKE SURE YOU:   Understand these instructions.  Will watch your condition.  Will get help right away if you are not doing well or get worse. Document Released: 10/26/2007 Document Revised: 08/01/2011 Document Reviewed: 01/03/2011 [facility] Patient Information 2015 [place], [place]. This information is not intended to replace advice given to you by your health care provider. Make sure you discuss any questions you have with your health care provider.

## 2014-01-13 NOTE — ED Notes (Signed)
Pt having CP for 2 days substernal radiating to left side. Pt having N/V, headache, some blurred vision pt diaphoretic. Pt also complaining of abd pain and diarrhea for 2 days. Pt is alert and oriented. Pt states he has not eaten today. CBG 128, EMS gave 324 ASA,  zofran, and 0.4 nitro given- no relief. BP 106/80, HR 66 SR with PAC.
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
## 2014-01-15 ENCOUNTER — Encounter: Payer: Self-pay | Admitting: Infectious Disease

## 2014-01-15 ENCOUNTER — Ambulatory Visit (INDEPENDENT_AMBULATORY_CARE_PROVIDER_SITE_OTHER): Payer: PRIVATE HEALTH INSURANCE | Admitting: Infectious Disease

## 2014-01-15 VITALS — BP 103/77 | HR 61 | Temp 98.1°F | Wt 198.8 lb

## 2014-01-15 DIAGNOSIS — F1910 Other psychoactive substance abuse, uncomplicated: Secondary | ICD-10-CM

## 2014-01-15 DIAGNOSIS — B182 Chronic viral hepatitis C: Secondary | ICD-10-CM

## 2014-01-15 DIAGNOSIS — F32A Depression, unspecified: Secondary | ICD-10-CM

## 2014-01-15 DIAGNOSIS — B20 Human immunodeficiency virus [HIV] disease: Secondary | ICD-10-CM

## 2014-01-15 DIAGNOSIS — F3289 Other specified depressive episodes: Secondary | ICD-10-CM

## 2014-01-15 DIAGNOSIS — F329 Major depressive disorder, single episode, unspecified: Secondary | ICD-10-CM

## 2014-01-15 DIAGNOSIS — Z23 Encounter for immunization: Secondary | ICD-10-CM

## 2014-01-15 NOTE — Progress Notes (Signed)
Subjective:    Patient ID: [name], male    DOB: 1955-10-31, 58 y.o.   MRN: [id number]  HPI   58 year old ever American man with HIV who had been incarcerated. 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 He ultimately had been admitted to [facility] where he had suffered from MRSA bacteremia. He was treated with IV antibiotics and ultimately discharged and [facility]   His HIV has since been perfectly controlled with a viral load of less than 20 to 83 Epzicom Prezista and Norvir and then changed to  prezcobix and epzicom.   His chaotic life this year with multiple admissions to the psychiatric hospital for depression and alcohol abuse.  His are load has skyrocketed to above 800,000 copies and his CD4 count remains low when checked in June. He states he has been persistently been taking his meds for at least the last 2 weeks although he also is but states that maybe he would not getting his medicines while in the psychiatric hospital. He is no longer homeless having found a place to stay with a friend of his.  When I last saw him VL down to few thousand copies  Lab Results  Component Value Date   HIV1RNAQUANT 1446* 01/01/2014    Lab Results  Component Value Date   CD4TABS 100* 01/01/2014   CD4TABS 70* 10/28/2013   CD4TABS 100* 08/08/2013   I switched him to [facility] but it is NOT clear to me how long he has been taking this, since the 12th or for shorter period of time.          Review of Systems  Respiratory: Negative for cough, chest tightness, shortness of breath, wheezing and stridor.   Cardiovascular: Negative for palpitations and leg swelling.  Gastrointestinal: Negative for nausea, vomiting, abdominal pain, diarrhea, constipation and abdominal distention.  Genitourinary: Negative for dysuria, hematuria, flank pain and difficulty urinating.  Musculoskeletal: Negative for arthralgias,  back pain, gait problem, joint swelling and myalgias.  Skin: Negative for color change, pallor, rash and wound.  Neurological: Positive for dizziness. Negative for tremors and light-headedness.  Hematological: Negative for adenopathy. Does not bruise/bleed easily.  Psychiatric/Behavioral: Positive for behavioral problems, dysphoric mood and decreased concentration. Negative for suicidal ideas, confusion, sleep disturbance and agitation.       Objective:   Physical Exam  Constitutional: He is oriented to person, place, and time. He appears well-developed and well-nourished. No distress.  HENT:  Head: Normocephalic and atraumatic.  Mouth/Throat: No oropharyngeal exudate.  Eyes: Conjunctivae and EOM are normal.  Neck: Normal range of motion. Neck supple.  Cardiovascular: Normal rate and regular rhythm.   Pulmonary/Chest: Effort normal. He has no wheezes.  Abdominal: He exhibits no distension.  Musculoskeletal: He exhibits no edema and no tenderness.  Lymphadenopathy:    He has no cervical adenopathy.  Neurological: He is alert and oriented to person, place, and time. He exhibits normal muscle tone. Coordination normal.  Skin: Skin is warm and dry. He is not diaphoretic. No erythema.  Psychiatric: His behavior is normal. Judgment and thought content normal. He exhibits a depressed mood.          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 He desperately needs therapy. However I don't want to  go through the painful process of getting medications if he can't show himself to be compliant. He also is clear and he does be sober for the sake of his liver. I would treat him for the hep C regardless whether he is using alcohol that his insurance company may feel otherwise.  #3 Crack cocaine and etoh abuse: relapsing at times really needs to get into  a stable long-term treatment plan.  #4 depression we will renew his Celexa and trazodone but not the Abilify.

## 2014-01-16 ENCOUNTER — Telehealth: Payer: Self-pay | Admitting: *Deleted

## 2014-01-16 NOTE — Telephone Encounter (Signed)
Pr requesting Trazodone refill be sent/called to pharmacy.  He was told at his visit this week that this would be refilled.  MD please advise.

## 2014-01-16 NOTE — Telephone Encounter (Signed)
That is fine 

## 2014-01-17 LAB — HIV-1 RNA ULTRAQUANT REFLEX TO GENTYP+
HIV 1 RNA Quant: 50 copies/mL — ABNORMAL HIGH (ref <20)
HIV-1 RNA Quant, Log: 1.7 {Log} — ABNORMAL HIGH (ref <1.30)

## 2014-01-20 MED ORDER — TRAZODONE HCL 50 MG PO TABS: 50.0000 mg | ORAL_TABLET | Freq: Every day | ORAL | Status: DC

## 2014-01-20 NOTE — Addendum Note (Signed)
Addended by: Jennet Maduro D on: 01/20/2014 03:45 PM   Modules accepted: Orders

## 2014-01-28 LAB — HIV-1 INTEGRASE GENOTYPE: Value last viral load: 50

## 2014-01-29 ENCOUNTER — Ambulatory Visit: Payer: Self-pay | Admitting: Infectious Disease

## 2014-02-28 ENCOUNTER — Emergency Department: Payer: Self-pay | Admitting: Internal Medicine

## 2014-02-28 LAB — COMPREHENSIVE METABOLIC PANEL
Albumin: 2.9 g/dL — ABNORMAL LOW (ref 3.4–5.0)
Alkaline Phosphatase: 54 U/L
Anion Gap: 7 (ref 7–16)
BUN: 18 mg/dL (ref 7–18)
Bilirubin,Total: 1.6 mg/dL — ABNORMAL HIGH (ref 0.2–1.0)
Calcium, Total: 8.9 mg/dL (ref 8.5–10.1)
Chloride: 103 mmol/L (ref 98–107)
Co2: 27 mmol/L (ref 21–32)
Creatinine: 1.08 mg/dL (ref 0.60–1.30)
EGFR (African American): >60
EGFR (Non-African Amer.): >60
Glucose: 125 mg/dL — ABNORMAL HIGH (ref 65–99)
Osmolality: 277 (ref 275–301)
Potassium: 4.2 mmol/L (ref 3.5–5.1)
SGOT(AST): 97 U/L — ABNORMAL HIGH (ref 15–37)
SGPT (ALT): 80 U/L — ABNORMAL HIGH
Sodium: 137 mmol/L (ref 136–145)
Total Protein: 8.3 g/dL — ABNORMAL HIGH (ref 6.4–8.2)

## 2014-02-28 LAB — DRUG SCREEN, URINE

## 2014-02-28 LAB — URINALYSIS, COMPLETE
Bacteria: NONE SEEN
Bilirubin,UR: NEGATIVE
Blood: NEGATIVE
Glucose,UR: NEGATIVE mg/dL (ref 0–75)
Hyaline Cast: 3
Ketone: NEGATIVE
Leukocyte Esterase: NEGATIVE
Nitrite: NEGATIVE
Ph: 6 (ref 4.5–8.0)
Protein: NEGATIVE
RBC,UR: 1 /HPF (ref 0–5)
Specific Gravity: 1.011 (ref 1.003–1.030)
Squamous Epithelial: NONE SEEN
WBC UR: NONE SEEN /HPF (ref 0–5)

## 2014-02-28 LAB — CBC
HCT: 31.7 % — ABNORMAL LOW (ref 40.0–52.0)
HGB: 10.1 g/dL — ABNORMAL LOW (ref 13.0–18.0)
MCH: 31.2 pg (ref 26.0–34.0)
MCHC: 31.8 g/dL — ABNORMAL LOW (ref 32.0–36.0)
MCV: 98 fL (ref 80–100)
Platelet: 219 10*3/uL (ref 150–440)
RBC: 3.22 10*6/uL — ABNORMAL LOW (ref 4.40–5.90)
RDW: 12.1 % (ref 11.5–14.5)
WBC: 3.3 10*3/uL — ABNORMAL LOW (ref 3.8–10.6)

## 2014-02-28 LAB — TSH: Thyroid Stimulating Horm: 0.231 u[IU]/mL — ABNORMAL LOW

## 2014-02-28 LAB — CK TOTAL AND CKMB (NOT AT ARMC)
CK, Total: 39 U/L
CK-MB: <0.5 ng/mL — ABNORMAL LOW (ref 0.5–3.6)

## 2014-02-28 LAB — TROPONIN I: Troponin-I: <0.02 ng/mL

## 2014-04-08 ENCOUNTER — Ambulatory Visit: Payer: Self-pay | Admitting: Infectious Disease

## 2014-09-01 ENCOUNTER — Other Ambulatory Visit: Payer: Self-pay

## 2014-09-09 ENCOUNTER — Other Ambulatory Visit: Payer: Self-pay | Admitting: Infectious Disease

## 2014-09-10 ENCOUNTER — Other Ambulatory Visit: Payer: Medicaid Other

## 2014-09-10 DIAGNOSIS — Z113 Encounter for screening for infections with a predominantly sexual mode of transmission: Secondary | ICD-10-CM

## 2014-09-10 DIAGNOSIS — B20 Human immunodeficiency virus [HIV] disease: Secondary | ICD-10-CM

## 2014-09-11 LAB — T-HELPER CELL (CD4) - (RCID CLINIC ONLY)
CD4 % Helper T Cell: 10 % — ABNORMAL LOW (ref 33–55)
CD4 T Cell Abs: 80 /uL — ABNORMAL LOW (ref 400–2700)

## 2014-09-12 LAB — HIV-1 RNA QUANT-NO REFLEX-BLD

## 2014-09-12 LAB — RPR

## 2014-09-15 ENCOUNTER — Ambulatory Visit: Payer: Self-pay | Admitting: Infectious Disease

## 2014-09-15 ENCOUNTER — Other Ambulatory Visit: Payer: Medicare Other

## 2014-09-15 DIAGNOSIS — B20 Human immunodeficiency virus [HIV] disease: Secondary | ICD-10-CM

## 2014-09-15 DIAGNOSIS — Z113 Encounter for screening for infections with a predominantly sexual mode of transmission: Secondary | ICD-10-CM

## 2014-09-15 LAB — CBC WITH DIFFERENTIAL/PLATELET
Basophils Absolute: 0 10*3/uL (ref 0.0–0.1)
Basophils Relative: 1 % (ref 0–1)
Eosinophils Absolute: 0.1 10*3/uL (ref 0.0–0.7)
Eosinophils Relative: 3 % (ref 0–5)
HCT: 39.3 % (ref 39.0–52.0)
Hemoglobin: 12.3 g/dL — ABNORMAL LOW (ref 13.0–17.0)
Lymphocytes Relative: 26 % (ref 12–46)
Lymphs Abs: 1 10*3/uL (ref 0.7–4.0)
MCH: 31.2 pg (ref 26.0–34.0)
MCHC: 31.3 g/dL (ref 30.0–36.0)
MCV: 99.7 fL (ref 78.0–100.0)
MPV: 9.7 fL (ref 8.6–12.4)
Monocytes Absolute: 0.5 10*3/uL (ref 0.1–1.0)
Monocytes Relative: 13 % — ABNORMAL HIGH (ref 3–12)
Neutro Abs: 2.2 10*3/uL (ref 1.7–7.7)
Neutrophils Relative %: 57 % (ref 43–77)
Platelets: 148 10*3/uL — ABNORMAL LOW (ref 150–400)
RBC: 3.94 MIL/uL — ABNORMAL LOW (ref 4.22–5.81)
RDW: 14.5 % (ref 11.5–15.5)
WBC: 3.9 10*3/uL — ABNORMAL LOW (ref 4.0–10.5)

## 2014-09-15 LAB — COMPLETE METABOLIC PANEL WITH GFR
ALT: 47 U/L (ref 0–53)
AST: 77 U/L — ABNORMAL HIGH (ref 0–37)
Albumin: 3.3 g/dL — ABNORMAL LOW (ref 3.5–5.2)
Alkaline Phosphatase: 55 U/L (ref 39–117)
BUN: 12 mg/dL (ref 6–23)
CO2: 27 mEq/L (ref 19–32)
Calcium: 8.6 mg/dL (ref 8.4–10.5)
Chloride: 107 mEq/L (ref 96–112)
Creat: 0.84 mg/dL (ref 0.50–1.35)
GFR, Est African American: >89 mL/min
GFR, Est Non African American: >89 mL/min
Glucose, Bld: 89 mg/dL (ref 70–99)
Potassium: 3.9 mEq/L (ref 3.5–5.3)
Sodium: 140 mEq/L (ref 135–145)
Total Bilirubin: 0.5 mg/dL (ref 0.2–1.2)
Total Protein: 7.5 g/dL (ref 6.0–8.3)

## 2014-09-16 LAB — RPR

## 2014-09-16 LAB — T-HELPER CELL (CD4) - (RCID CLINIC ONLY)
CD4 % Helper T Cell: 9 % — ABNORMAL LOW (ref 33–55)
CD4 T Cell Abs: 110 /uL — ABNORMAL LOW (ref 400–2700)

## 2014-09-16 LAB — HIV-1 RNA QUANT-NO REFLEX-BLD
HIV 1 RNA Quant: <20 copies/mL (ref <20)
HIV-1 RNA Quant, Log: <1.3 {Log} (ref <1.30)

## 2014-09-29 ENCOUNTER — Ambulatory Visit: Payer: Self-pay | Admitting: Infectious Disease

## 2014-09-29 ENCOUNTER — Encounter: Payer: Self-pay | Admitting: Infectious Disease

## 2014-09-29 ENCOUNTER — Ambulatory Visit (INDEPENDENT_AMBULATORY_CARE_PROVIDER_SITE_OTHER): Payer: Medicare Other | Admitting: Infectious Disease

## 2014-09-29 VITALS — BP 122/84 | HR 91 | Temp 98.2°F | Wt 212.0 lb

## 2014-09-29 DIAGNOSIS — M5430 Sciatica, unspecified side: Secondary | ICD-10-CM

## 2014-09-29 DIAGNOSIS — B182 Chronic viral hepatitis C: Secondary | ICD-10-CM | POA: Diagnosis not present

## 2014-09-29 DIAGNOSIS — B20 Human immunodeficiency virus [HIV] disease: Secondary | ICD-10-CM

## 2014-09-29 DIAGNOSIS — F3341 Major depressive disorder, recurrent, in partial remission: Secondary | ICD-10-CM | POA: Diagnosis not present

## 2014-09-29 DIAGNOSIS — F1010 Alcohol abuse, uncomplicated: Secondary | ICD-10-CM | POA: Diagnosis not present

## 2014-09-29 DIAGNOSIS — F339 Major depressive disorder, recurrent, unspecified: Secondary | ICD-10-CM | POA: Insufficient documentation

## 2014-09-29 HISTORY — DX: Major depressive disorder, recurrent, unspecified: F33.9

## 2014-09-29 HISTORY — DX: Sciatica, unspecified side: M54.30

## 2014-09-29 HISTORY — DX: Chronic viral hepatitis C: B18.2

## 2014-09-29 MED ORDER — GABAPENTIN 100 MG PO CAPS: 100.0000 mg | ORAL_CAPSULE | Freq: Three times a day (TID) | ORAL | Status: DC
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
## 2014-09-29 NOTE — Progress Notes (Signed)
Subjective:    Patient ID: [name], male    DOB: 09/16/1955, 59 y.o.   MRN: [id number]  HPI   59 year old ever American man with HIV who had been incarcerated. 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 He ultimately had been [facility] where he had suffered from MRSA bacteremia. He was treated with IV antibiotics and ultimately discharged and establish care again [facility] in the [facility].   His HIV has since been perfectly controlled with a viral load of less than 20 to 83 Epzicom Prezista and Norvir and then changed to  prezcobix and epzicom.   His HIV has come under very nice control since [facility]  HIs chronic hepatitis C without hepatic coma needs to be treated given that he has METAVIR F4.   He has been CLEAN from IVDU for years and clean from alcohol for past 2 weeks.    Lab Results  Component Value Date   HIV1RNAQUANT <20 09/15/2014    Lab Results  Component Value Date   CD4TABS 110* 09/15/2014   CD4TABS 80* 09/10/2014   CD4TABS 100* 01/01/2014    He is having some back pain and flank pain with radiation down the left leg   Review of Systems  Respiratory: Negative for cough, chest tightness, shortness of breath, wheezing and stridor.   Cardiovascular: Negative for palpitations and leg swelling.  Gastrointestinal: Positive for abdominal pain. Negative for nausea, vomiting, diarrhea, constipation and abdominal distention.  Genitourinary: Negative for dysuria, hematuria, flank pain and difficulty urinating.  Musculoskeletal: Positive for back pain. Negative for myalgias, joint swelling, arthralgias and gait problem.  Skin: Negative for color change, pallor, rash and wound.  Neurological: Negative for tremors and light-headedness.  Hematological: Negative for adenopathy. Does not bruise/bleed easily.  Psychiatric/Behavioral: Negative for suicidal ideas, confusion, sleep disturbance and  agitation.       Objective:   Physical Exam  Constitutional: He is oriented to person, place, and time. He appears well-developed and well-nourished. No distress.  HENT:  Head: Normocephalic and atraumatic.  Mouth/Throat: No oropharyngeal exudate.  Eyes: Conjunctivae and EOM are normal.  Neck: Normal range of motion. Neck supple.  Cardiovascular: Normal rate and regular rhythm.   Pulmonary/Chest: Effort normal. He has no wheezes.  Abdominal: Soft. Bowel sounds are normal. He exhibits no distension. There is no tenderness. There is no rebound.  Musculoskeletal: He exhibits no edema or tenderness.       Left hip: He exhibits normal range of motion and normal strength.  Pain with straight left leg raise  Lymphadenopathy:    He has no cervical adenopathy.  Neurological: He is alert and oriented to person, place, and time. He exhibits normal muscle tone. Coordination normal.  Skin: Skin is warm and dry. He is not diaphoretic. No erythema.  Psychiatric: His behavior is normal. Judgment and thought content normal.          Assessment & Plan:   #1 HIV-AIDS: TRIUMEQ perfect control!  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  He needs to be treated. He is NOT an active IVDU. Alcohol has been the bigger problem with cocaine but he has been clean from both now for past 2 weeks and in treatment.  We will check NS5A mutations and HCV RNA and plan on Zepatier (perhaps easier to fit with his prilosec vs Harvoni without prilosec) x 12 weeks  He states that he at times has  some fogginess to his experience which could be due to HCV. HOpefully will improve with treatment  #3 Crack cocaine and etoh abuse: under control now and emphasized the absolute need to keep these under control to get him through CURE of HCV and for long term protection of his liver  #4 depression: doing well on current meds  #5 Sciatica: chronic no need for urgent imaging  #6 Smoking: tackle once we have  gotten HCV cured

## 2014-09-30 ENCOUNTER — Ambulatory Visit: Payer: Self-pay | Admitting: Infectious Disease

## 2014-09-30 LAB — HEPATITIS C RNA QUANTITATIVE
HCV Quantitative Log: 5.85 {Log} — ABNORMAL HIGH (ref <1.18)
HCV Quantitative: 703961 IU/mL — ABNORMAL HIGH (ref <15)

## 2014-10-01 ENCOUNTER — Other Ambulatory Visit: Payer: Self-pay | Admitting: Pharmacist Clinician (PhC)/ Clinical Pharmacy Specialist

## 2014-10-01 MED ORDER — LEDIPASVIR-SOFOSBUVIR 90-400 MG PO TABS: 1.0000 | ORAL_TABLET | Freq: Every day | ORAL | Status: DC

## 2014-10-04 LAB — HCV RNA NS5A DRUG RESISTANCE

## 2014-10-06 ENCOUNTER — Ambulatory Visit: Payer: Self-pay

## 2014-10-07 ENCOUNTER — Emergency Department (HOSPITAL_COMMUNITY): Payer: Medicare Other

## 2014-10-07 ENCOUNTER — Encounter (HOSPITAL_COMMUNITY): Payer: Self-pay | Admitting: Emergency Medicine

## 2014-10-07 ENCOUNTER — Emergency Department (HOSPITAL_COMMUNITY)
Admission: EM | Admit: 2014-10-07 | Discharge: 2014-10-08 | Disposition: A | Discharge status: Home / Self Care | Payer: Medicare Other | Attending: Emergency Medicine | Admitting: Emergency Medicine

## 2014-10-07 DIAGNOSIS — Z21 Asymptomatic human immunodeficiency virus [HIV] infection status: Secondary | ICD-10-CM | POA: Insufficient documentation

## 2014-10-07 DIAGNOSIS — R0789 Other chest pain: Secondary | ICD-10-CM | POA: Diagnosis not present

## 2014-10-07 DIAGNOSIS — F1410 Cocaine abuse, uncomplicated: Secondary | ICD-10-CM | POA: Diagnosis not present

## 2014-10-07 DIAGNOSIS — Z720 Tobacco use: Secondary | ICD-10-CM | POA: Insufficient documentation

## 2014-10-07 DIAGNOSIS — Z87828 Personal history of other (healed) physical injury and trauma: Secondary | ICD-10-CM | POA: Insufficient documentation

## 2014-10-07 DIAGNOSIS — Z8614 Personal history of Methicillin resistant Staphylococcus aureus infection: Secondary | ICD-10-CM | POA: Insufficient documentation

## 2014-10-07 DIAGNOSIS — J441 Chronic obstructive pulmonary disease with (acute) exacerbation: Secondary | ICD-10-CM | POA: Insufficient documentation

## 2014-10-07 DIAGNOSIS — R079 Chest pain, unspecified: Secondary | ICD-10-CM | POA: Diagnosis present

## 2014-10-07 DIAGNOSIS — Z8619 Personal history of other infectious and parasitic diseases: Secondary | ICD-10-CM | POA: Diagnosis not present

## 2014-10-07 DIAGNOSIS — M5430 Sciatica, unspecified side: Secondary | ICD-10-CM | POA: Diagnosis not present

## 2014-10-07 DIAGNOSIS — F319 Bipolar disorder, unspecified: Secondary | ICD-10-CM | POA: Diagnosis not present

## 2014-10-07 DIAGNOSIS — Z87448 Personal history of other diseases of urinary system: Secondary | ICD-10-CM | POA: Insufficient documentation

## 2014-10-07 DIAGNOSIS — Z79899 Other long term (current) drug therapy: Secondary | ICD-10-CM | POA: Insufficient documentation

## 2014-10-07 LAB — CBC WITH DIFFERENTIAL/PLATELET
Basophils Absolute: 0 10*3/uL (ref 0.0–0.1)
Basophils Relative: 0 % (ref 0–1)
Eosinophils Absolute: 0.2 10*3/uL (ref 0.0–0.7)
Eosinophils Relative: 5 % (ref 0–5)
HCT: 39.4 % (ref 39.0–52.0)
Hemoglobin: 13 g/dL (ref 13.0–17.0)
Lymphocytes Relative: 24 % (ref 12–46)
Lymphs Abs: 1.1 10*3/uL (ref 0.7–4.0)
MCH: 32 pg (ref 26.0–34.0)
MCHC: 33 g/dL (ref 30.0–36.0)
MCV: 97 fL (ref 78.0–100.0)
Monocytes Absolute: 0.4 10*3/uL (ref 0.1–1.0)
Monocytes Relative: 8 % (ref 3–12)
Neutro Abs: 2.9 10*3/uL (ref 1.7–7.7)
Neutrophils Relative %: 63 % (ref 43–77)
Platelets: 137 10*3/uL — ABNORMAL LOW (ref 150–400)
RBC: 4.06 MIL/uL — ABNORMAL LOW (ref 4.22–5.81)
RDW: 13.7 % (ref 11.5–15.5)
WBC: 4.7 10*3/uL (ref 4.0–10.5)

## 2014-10-07 LAB — I-STAT TROPONIN, ED: Troponin i, poc: 0 ng/mL (ref 0.00–0.08)

## 2014-10-07 MED ORDER — KETOROLAC TROMETHAMINE 30 MG/ML IJ SOLN
30.0000 mg | Freq: Once | INTRAMUSCULAR | Status: AC
  Administered 2014-10-08: 30 mg via INTRAVENOUS
  Filled 2014-10-07: qty 1

## 2014-10-07 MED ORDER — FAMOTIDINE IN NACL 20-0.9 MG/50ML-% IV SOLN
20.0000 mg | Freq: Once | INTRAVENOUS | Status: AC
  Administered 2014-10-08: 20 mg via INTRAVENOUS
  Filled 2014-10-07: qty 50

## 2014-10-07 NOTE — ED Notes (Signed)
Per EMS patient called due to chest pain that started 2-3 hours ago. Patient was given 1 nitro and 324 aspirin by EMS.  Patient states some dizziness and nausea with no vomiting. History of schizophrenia.
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
## 2014-10-08 LAB — COMPREHENSIVE METABOLIC PANEL
ALT: 43 U/L (ref 17–63)
AST: 62 U/L — ABNORMAL HIGH (ref 15–41)
Albumin: 3.1 g/dL — ABNORMAL LOW (ref 3.5–5.0)
Alkaline Phosphatase: 61 U/L (ref 38–126)
Anion gap: 8 (ref 5–15)
BUN: 15 mg/dL (ref 6–20)
CO2: 28 mmol/L (ref 22–32)
Calcium: 8.9 mg/dL (ref 8.9–10.3)
Chloride: 102 mmol/L (ref 101–111)
Creatinine, Ser: 1.03 mg/dL (ref 0.61–1.24)
GFR calc Af Amer: >60 mL/min (ref >60)
GFR calc non Af Amer: >60 mL/min (ref >60)
Glucose, Bld: 118 mg/dL — ABNORMAL HIGH (ref 65–99)
Potassium: 3.8 mmol/L (ref 3.5–5.1)
Sodium: 138 mmol/L (ref 135–145)
Total Bilirubin: 0.6 mg/dL (ref 0.3–1.2)
Total Protein: 7.7 g/dL (ref 6.5–8.1)

## 2014-10-08 LAB — RAPID URINE DRUG SCREEN, HOSP PERFORMED
Amphetamines: NOT DETECTED
Barbiturates: NOT DETECTED
Benzodiazepines: NOT DETECTED
Cocaine: POSITIVE — AB
Opiates: NOT DETECTED
Tetrahydrocannabinol: POSITIVE — AB

## 2014-10-08 LAB — ETHANOL: Alcohol, Ethyl (B): <5 mg/dL (ref <5)

## 2014-10-08 MED ORDER — CYCLOBENZAPRINE HCL 5 MG PO TABS: 5.0000 mg | ORAL_TABLET | Freq: Three times a day (TID) | ORAL | Status: DC | PRN

## 2014-10-08 MED ORDER — NAPROXEN 500 MG PO TABS: 500.0000 mg | ORAL_TABLET | Freq: Two times a day (BID) | ORAL | Status: DC

## 2014-10-08 NOTE — ED Notes (Signed)
Pt verbalized understanding of d/c instructions and has no further questions.  

## 2014-10-08 NOTE — Discharge Instructions (Signed)
Take the medication as prescribed. Recheck if you get a feer, cough or feel worse.  Chest Wall Pain Chest wall pain is pain in or around the bones and muscles of your chest. It may take up to 6 weeks to get better. It may take longer if you must stay physically active in your work and activities.  CAUSES  Chest wall pain may happen on its own. However, it may be caused by:  A viral illness like the flu.  Injury.  Coughing.  Exercise.  Arthritis.  Fibromyalgia.  Shingles. HOME CARE INSTRUCTIONS   Avoid overtiring physical activity. Try not to strain or perform activities that cause pain. This includes any activities using your chest or your abdominal and side muscles, especially if heavy weights are used.  Put ice on the sore area.  Put ice in a plastic bag.  Place a towel between your skin and the bag.  Leave the ice on for 15-20 minutes per hour while awake for the first 2 days.  Only take over-the-counter or prescription medicines for pain, discomfort, or fever as directed by your caregiver. SEEK IMMEDIATE MEDICAL CARE IF:   Your pain increases, or you are very uncomfortable.  You have a fever.  Your chest pain becomes worse.  You have new, unexplained symptoms.  You have nausea or vomiting.  You feel sweaty or lightheaded.  You have a cough with phlegm (sputum), or you cough up blood. MAKE SURE YOU:   Understand these instructions.  Will watch your condition.  Will get help right away if you are not doing well or get worse. Document Released: 05/09/2005 Document Revised: 08/01/2011 Document Reviewed: 01/03/2011 [facility] Patient Information 2015 [employer]. This information is not intended to replace advice given to you by your health care provider. Make sure you discuss any questions you have with your health care provider.

## 2014-10-08 NOTE — ED Notes (Signed)
Patient states he can't urinate and refuses in and out cath.

## 2014-10-11 ENCOUNTER — Encounter (HOSPITAL_COMMUNITY): Payer: Self-pay | Admitting: Emergency Medicine

## 2014-10-11 ENCOUNTER — Emergency Department (HOSPITAL_COMMUNITY)
Admission: EM | Admit: 2014-10-11 | Discharge: 2014-10-12 | Disposition: A | Discharge status: Home / Self Care | Payer: Medicare Other | Attending: Emergency Medicine | Admitting: Emergency Medicine

## 2014-10-11 DIAGNOSIS — Z791 Long term (current) use of non-steroidal anti-inflammatories (NSAID): Secondary | ICD-10-CM | POA: Diagnosis not present

## 2014-10-11 DIAGNOSIS — F10120 Alcohol abuse with intoxication, uncomplicated: Secondary | ICD-10-CM | POA: Diagnosis not present

## 2014-10-11 DIAGNOSIS — Z720 Tobacco use: Secondary | ICD-10-CM | POA: Insufficient documentation

## 2014-10-11 DIAGNOSIS — M5430 Sciatica, unspecified side: Secondary | ICD-10-CM | POA: Diagnosis not present

## 2014-10-11 DIAGNOSIS — Z87828 Personal history of other (healed) physical injury and trauma: Secondary | ICD-10-CM | POA: Insufficient documentation

## 2014-10-11 DIAGNOSIS — F10129 Alcohol abuse with intoxication, unspecified: Secondary | ICD-10-CM | POA: Diagnosis present

## 2014-10-11 DIAGNOSIS — Z87448 Personal history of other diseases of urinary system: Secondary | ICD-10-CM | POA: Diagnosis not present

## 2014-10-11 DIAGNOSIS — F319 Bipolar disorder, unspecified: Secondary | ICD-10-CM | POA: Insufficient documentation

## 2014-10-11 DIAGNOSIS — J449 Chronic obstructive pulmonary disease, unspecified: Secondary | ICD-10-CM | POA: Diagnosis not present

## 2014-10-11 DIAGNOSIS — Z8614 Personal history of Methicillin resistant Staphylococcus aureus infection: Secondary | ICD-10-CM | POA: Insufficient documentation

## 2014-10-11 DIAGNOSIS — B20 Human immunodeficiency virus [HIV] disease: Secondary | ICD-10-CM | POA: Insufficient documentation

## 2014-10-11 DIAGNOSIS — Z79899 Other long term (current) drug therapy: Secondary | ICD-10-CM | POA: Diagnosis not present

## 2014-10-11 DIAGNOSIS — F10920 Alcohol use, unspecified with intoxication, uncomplicated: Secondary | ICD-10-CM

## 2014-10-11 NOTE — Discharge Instructions (Signed)
Please call your doctor for a followup appointment within 24-48 hours. When you talk to your doctor please let them know that you were seen in the emergency department and have them acquire all of your records so that they can discuss the findings with you and formulate a treatment plan to fully care for your new and ongoing problems. ° °
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
## 2014-10-11 NOTE — ED Notes (Signed)
Pt. Ambulated the bathroom initially assisted by Tech but insisted to walk by himself ,  Able to go back to bed without difficulty.
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
## 2014-10-16 ENCOUNTER — Telehealth: Payer: Self-pay | Admitting: Pharmacist Clinician (PhC)/ Clinical Pharmacy Specialist

## 2014-10-16 NOTE — Telephone Encounter (Signed)
The pharmacy has been trying to contact him to pick up his harvoni for a while not. He has been impossible to get in touch with all of the communication has been through his sister. Got in touch with her today and told her if she sees him again to tell him that he needs to pick up his Harvoni.

## 2014-11-12 ENCOUNTER — Ambulatory Visit: Payer: Self-pay | Admitting: Infectious Disease

## 2015-01-20 ENCOUNTER — Encounter: Payer: Self-pay | Admitting: Infectious Disease

## 2015-01-20 ENCOUNTER — Ambulatory Visit (INDEPENDENT_AMBULATORY_CARE_PROVIDER_SITE_OTHER): Payer: Medicare Other | Admitting: Infectious Disease

## 2015-01-20 VITALS — BP 138/98 | HR 69 | Temp 98.1°F | Wt 214.0 lb

## 2015-01-20 DIAGNOSIS — K253 Acute gastric ulcer without hemorrhage or perforation: Secondary | ICD-10-CM

## 2015-01-20 DIAGNOSIS — F331 Major depressive disorder, recurrent, moderate: Secondary | ICD-10-CM

## 2015-01-20 DIAGNOSIS — M5431 Sciatica, right side: Secondary | ICD-10-CM

## 2015-01-20 DIAGNOSIS — B20 Human immunodeficiency virus [HIV] disease: Secondary | ICD-10-CM | POA: Diagnosis not present

## 2015-01-20 DIAGNOSIS — Z23 Encounter for immunization: Secondary | ICD-10-CM | POA: Diagnosis not present

## 2015-01-20 DIAGNOSIS — Z653 Problems related to other legal circumstances: Secondary | ICD-10-CM | POA: Insufficient documentation

## 2015-01-20 DIAGNOSIS — G5790 Unspecified mononeuropathy of unspecified lower limb: Secondary | ICD-10-CM

## 2015-01-20 DIAGNOSIS — F1010 Alcohol abuse, uncomplicated: Secondary | ICD-10-CM

## 2015-01-20 DIAGNOSIS — G609 Hereditary and idiopathic neuropathy, unspecified: Secondary | ICD-10-CM

## 2015-01-20 DIAGNOSIS — F1490 Cocaine use, unspecified, uncomplicated: Secondary | ICD-10-CM

## 2015-01-20 DIAGNOSIS — D696 Thrombocytopenia, unspecified: Secondary | ICD-10-CM | POA: Diagnosis not present

## 2015-01-20 DIAGNOSIS — F258 Other schizoaffective disorders: Secondary | ICD-10-CM

## 2015-01-20 DIAGNOSIS — K259 Gastric ulcer, unspecified as acute or chronic, without hemorrhage or perforation: Secondary | ICD-10-CM

## 2015-01-20 DIAGNOSIS — F1021 Alcohol dependence, in remission: Secondary | ICD-10-CM

## 2015-01-20 DIAGNOSIS — B182 Chronic viral hepatitis C: Secondary | ICD-10-CM

## 2015-01-20 HISTORY — DX: Human immunodeficiency virus (HIV) disease: B20

## 2015-01-20 HISTORY — DX: Problems related to other legal circumstances: Z65.3

## 2015-01-20 LAB — COMPLETE METABOLIC PANEL WITH GFR
ALT: 76 U/L — ABNORMAL HIGH (ref 9–46)
AST: 69 U/L — ABNORMAL HIGH (ref 10–35)
Albumin: 3.8 g/dL (ref 3.6–5.1)
Alkaline Phosphatase: 58 U/L (ref 40–115)
BUN: 18 mg/dL (ref 7–25)
CO2: 29 mmol/L (ref 20–31)
Calcium: 9.6 mg/dL (ref 8.6–10.3)
Chloride: 100 mmol/L (ref 98–110)
Creat: 0.88 mg/dL (ref 0.70–1.25)
GFR, Est African American: >89 mL/min (ref >=60)
GFR, Est Non African American: >89 mL/min (ref >=60)
Glucose, Bld: 83 mg/dL (ref 65–99)
Potassium: 4.3 mmol/L (ref 3.5–5.3)
Sodium: 136 mmol/L (ref 135–146)
Total Bilirubin: 0.6 mg/dL (ref 0.2–1.2)
Total Protein: 8.7 g/dL — ABNORMAL HIGH (ref 6.1–8.1)

## 2015-01-20 LAB — CBC WITH DIFFERENTIAL/PLATELET
Basophils Absolute: 0 10*3/uL (ref 0.0–0.1)
Basophils Relative: 0 % (ref 0–1)
Eosinophils Absolute: 0.1 10*3/uL (ref 0.0–0.7)
Eosinophils Relative: 2 % (ref 0–5)
HCT: 43.2 % (ref 39.0–52.0)
Hemoglobin: 14.3 g/dL (ref 13.0–17.0)
Lymphocytes Relative: 28 % (ref 12–46)
Lymphs Abs: 1.4 10*3/uL (ref 0.7–4.0)
MCH: 32.3 pg (ref 26.0–34.0)
MCHC: 33.1 g/dL (ref 30.0–36.0)
MCV: 97.5 fL (ref 78.0–100.0)
MPV: 9.8 fL (ref 8.6–12.4)
Monocytes Absolute: 0.7 10*3/uL (ref 0.1–1.0)
Monocytes Relative: 13 % — ABNORMAL HIGH (ref 3–12)
Neutro Abs: 2.9 10*3/uL (ref 1.7–7.7)
Neutrophils Relative %: 57 % (ref 43–77)
Platelets: 123 10*3/uL — ABNORMAL LOW (ref 150–400)
RBC: 4.43 MIL/uL (ref 4.22–5.81)
RDW: 13.4 % (ref 11.5–15.5)
WBC: 5 10*3/uL (ref 4.0–10.5)

## 2015-01-20 MED ORDER — CITALOPRAM HYDROBROMIDE 40 MG PO TABS: 40.0000 mg | ORAL_TABLET | Freq: Every day | ORAL | Status: DC

## 2015-01-20 MED ORDER — RISPERIDONE 2 MG PO TABS: 2.0000 mg | ORAL_TABLET | Freq: Every day | ORAL | Status: DC

## 2015-01-20 MED ORDER — ABACAVIR-DOLUTEGRAVIR-LAMIVUD 600-50-300 MG PO TABS: 1.0000 | ORAL_TABLET | Freq: Every day | ORAL | Status: DC

## 2015-01-20 MED ORDER — ATOVAQUONE 750 MG/5ML PO SUSP: 1500.0000 mg | Freq: Every day | ORAL | Status: DC

## 2015-01-20 MED ORDER — FAMOTIDINE 20 MG PO TABS: 20.0000 mg | ORAL_TABLET | Freq: Every day | ORAL | Status: DC

## 2015-01-20 MED ORDER — GABAPENTIN 300 MG PO CAPS: 300.0000 mg | ORAL_CAPSULE | ORAL | Status: DC
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
## 2015-01-21 LAB — RPR

## 2015-01-22 LAB — T-HELPER CELL (CD4) - (RCID CLINIC ONLY)
CD4 % Helper T Cell: 12 % — ABNORMAL LOW (ref 33–55)
CD4 T Cell Abs: 150 /uL — ABNORMAL LOW (ref 400–2700)

## 2015-01-22 LAB — HIV-1 RNA QUANT-NO REFLEX-BLD
HIV 1 RNA Quant: 68 copies/mL — ABNORMAL HIGH (ref <20)
HIV-1 RNA Quant, Log: 1.83 {Log} — ABNORMAL HIGH (ref <1.30)

## 2015-02-16 ENCOUNTER — Encounter (HOSPITAL_COMMUNITY): Payer: Self-pay | Admitting: *Deleted

## 2015-02-16 ENCOUNTER — Emergency Department (HOSPITAL_COMMUNITY)
Admission: EM | Admit: 2015-02-16 | Discharge: 2015-02-17 | Disposition: A | Discharge status: Home / Self Care | Payer: Medicaid Other | Attending: Emergency Medicine | Admitting: Emergency Medicine

## 2015-02-16 DIAGNOSIS — Z87448 Personal history of other diseases of urinary system: Secondary | ICD-10-CM | POA: Diagnosis not present

## 2015-02-16 DIAGNOSIS — Z8619 Personal history of other infectious and parasitic diseases: Secondary | ICD-10-CM | POA: Diagnosis not present

## 2015-02-16 DIAGNOSIS — Z79899 Other long term (current) drug therapy: Secondary | ICD-10-CM | POA: Insufficient documentation

## 2015-02-16 DIAGNOSIS — F10129 Alcohol abuse with intoxication, unspecified: Secondary | ICD-10-CM | POA: Diagnosis present

## 2015-02-16 DIAGNOSIS — F10920 Alcohol use, unspecified with intoxication, uncomplicated: Secondary | ICD-10-CM

## 2015-02-16 DIAGNOSIS — B20 Human immunodeficiency virus [HIV] disease: Secondary | ICD-10-CM | POA: Insufficient documentation

## 2015-02-16 DIAGNOSIS — F10120 Alcohol abuse with intoxication, uncomplicated: Secondary | ICD-10-CM | POA: Insufficient documentation

## 2015-02-16 DIAGNOSIS — Z720 Tobacco use: Secondary | ICD-10-CM | POA: Insufficient documentation

## 2015-02-16 DIAGNOSIS — J449 Chronic obstructive pulmonary disease, unspecified: Secondary | ICD-10-CM | POA: Insufficient documentation

## 2015-02-16 DIAGNOSIS — F319 Bipolar disorder, unspecified: Secondary | ICD-10-CM | POA: Insufficient documentation

## 2015-02-16 DIAGNOSIS — Z8739 Personal history of other diseases of the musculoskeletal system and connective tissue: Secondary | ICD-10-CM | POA: Insufficient documentation

## 2015-02-16 DIAGNOSIS — Z87828 Personal history of other (healed) physical injury and trauma: Secondary | ICD-10-CM | POA: Insufficient documentation

## 2015-02-16 NOTE — ED Notes (Signed)
Pt arrives to the ER via EMS requesting ETOH Detox; pt states that he drinks 5-6 40oz / day; pt reports that he drank "a bunch of 40oz today"; pt denies SI / HI
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
## 2015-02-16 NOTE — ED Notes (Signed)
Patient is alert, resting quietly with eyes closed. He will open his eyes when spoken too. Pt has a bag of potato chips in his hand that he was recently was eating. Pt's respirations are even, regular, and unlabored. Skin warm and dry.
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
## 2015-02-17 ENCOUNTER — Ambulatory Visit: Payer: Self-pay | Admitting: Infectious Disease

## 2015-02-17 NOTE — Discharge Instructions (Signed)
Alcohol Intoxication °Alcohol intoxication occurs when you drink enough alcohol that it affects your ability to function. It can be mild or very severe. Drinking a lot of alcohol in a short time is called binge drinking. This can be very harmful. Drinking alcohol can also be more dangerous if you are taking medicines or other drugs. Some of the effects caused by alcohol may include: °· Loss of coordination. °· Changes in mood and behavior. °· Unclear thinking. °· Trouble talking (slurred speech). °· Throwing up (vomiting). °· Confusion. °· Slowed breathing. °· Twitching and shaking (seizures). °· Loss of consciousness. °HOME CARE °· Do not drive after drinking alcohol. °· Drink enough water and fluids to keep your pee (urine) clear or pale yellow. Avoid caffeine. °· Only take medicine as told by your doctor. °GET HELP IF: °· You throw up (vomit) many times. °· You do not feel better after a few days. °· You frequently have alcohol intoxication. Your doctor can help decide if you should see a substance use treatment counselor. °GET HELP RIGHT AWAY IF: °· You become shaky when you stop drinking. °· You have twitching and shaking. °· You throw up blood. It may look bright red or like coffee grounds. °· You notice blood in your poop (bowel movements). °· You become lightheaded or pass out (faint). °MAKE SURE YOU:  °· Understand these instructions. °· Will watch your condition. °· Will get help right away if you are not doing well or get worse. °Document Released: 10/26/2007 Document Revised: 01/09/2013 Document Reviewed: 10/12/2012 °ExitCare® Patient Information ©2015 [employer]. This information is not intended to replace advice given to you by your health care provider. Make sure you discuss any questions you have with your health care provider. ° ° °Emergency Department Resource Guide °1) Find a Doctor and Pay Out of Pocket °Although you won't have to find out who is covered by your insurance plan, it is a good  idea to ask around and get recommendations. You will then need to call the office and see if the doctor you have chosen will accept you as a new patient and what types of options they offer for patients who are self-pay. Some doctors offer discounts or will set up payment plans for their patients who do not have insurance, but you will need to ask so you aren't surprised when you get to your appointment. ° °2) Contact Your Local Health Department °Not all health departments have doctors that can see patients for sick visits, but many do, so it is worth a call to see if yours does. If you don't know where your local health department is, you can check in your phone book. The CDC also has a tool to help you locate your state's health department, and many state websites also have listings of all of their local health departments. ° °3) Find a Walk-in Clinic °If your illness is not likely to be very severe or complicated, you may want to try a walk in clinic. These are popping up all over the country in pharmacies, drugstores, and shopping centers. They're usually staffed by nurse practitioners or physician assistants that have been trained to treat common illnesses and complaints. They're usually fairly quick and inexpensive. However, if you have serious medical issues or chronic medical problems, these are probably not your best option. ° °No Primary Care Doctor: °- Call [facility] at  832-8000 - they can help you locate a primary care doctor that  accepts your insurance, provides   certain services, etc. °- Physician Referral Service- 1-800-533-3463 ° °Chronic Pain Problems: °Organization         Address  Phone   Notes  °[facility]  (336) 297-2271 Patients need to be referred by their primary care doctor.  ° °Medication Assistance: °Organization         Address  Phone   Notes  °[place] [facility] [address] °[place], [place] [address] (336) 641-8030  --Must be a resident of [place] °-- Must have NO insurance coverage whatsoever (no Medicaid/ Medicare, etc.) °-- The pt. MUST have a primary care doctor that directs their care regularly and follows them in the community °  °[facility]  (866) 331-1348   °[employer]  (888) 892-1162   ° °Agencies that provide inexpensive medical care: °Organization         Address  Phone   Notes  °[facility]  (336) 832-8035   °[facility]    (336) 832-7272   °[facility] [address] °[place], [place] [address] (336) 832-4777   °[facility] [address], °[place] (336) 271-4999   °[facility]    (336) 373-0678   °[facility]    (336) 272-1050   °[facility] ° [address], [place] Phone:  (336) 832-4444, Fax:  (336) 832-4440 Hours of Operation:  9 am - 6 pm, M-F.  Also accepts Medicaid/Medicare and self-pay.  °[facility] ° [address], [place] Phone: (336) 832-3150, Fax: (336) 832-3151. Hours of Operation:  8:30 am - 5:30 pm, M-F.  Also accepts Medicaid and self-pay.  °[facility] [place] [address], [place] Phone: (336) 878-6027   °[facility] [address], [place], [place] (336)723-1848, Ext. 123 Mondays & Thursdays: 7-9 AM.  First 15 patients are seen on a first come, first serve basis. °  ° °Medicaid-accepting [place] Providers: ° °Organization         Address  Phone   Notes  °[facility] [address], [place] (336) 641-2100 Also accepts self-pay patients.  °[facility] [address], [place] ° (336) 856-9996   °[facility] [address], [place] (336) 288-8857   °[facility] [address] [place] [address], [place] (336) 299-7000   °Veita Bland [address], [place]  ° (336) 373-1557 Only  accepts [place] Access Medicaid patients after they have their name applied to their card.  ° °Self-Pay (no insurance) in [place]: ° °Organization         Address  Phone   Notes  °Sickle Cell Patients, [facility] [address], [place] (336) 832-1970   °[facility] [address], [place] (336) 832-4400   °[facility] ° [address], [place] (336) 992-4800   °[facility]/Dr. Osei-Bonsu ° [address] [place] [address], [place] or [address], [place] (336) 841-8500 Phone number for both [place] and [place] locations is the same.  °[facility] [address], [place] (336) 299-0000   °[facility]  [address] [place] [address],  or [address] (336) 852-7530 °(336) 878-2260   °[facility] [address], [place] (336) 350-1642, phone; (336) 294-5005, fax Sees patients 1st and 3rd Saturday of every month.  Must not qualify for public or private insurance (i.e. Medicaid, Medicare, [place] Health Choice, Veterans' Benefits) • Household income should be no more than 200% of the poverty level •The clinic cannot treat you if you are pregnant or think you are pregnant • Sexually transmitted diseases are not treated at the clinic.  ° ° °Dental Care: °Organization         Address  Phone  Notes  °[facility] [facility] [address], [place] (336) 641-6152 Accepts children up to age 21 who are enrolled in Medicaid or [place] Health Choice; pregnant women with a Medicaid card; and children who have applied for Medicaid or [place] Health Choice, but were declined, whose parents can pay a reduced fee at time of service.  °[facility] [place]  [address], [place] (336) 641-7733 Accepts children up to age 21 who are enrolled in Medicaid or [place] Health Choice; pregnant  women with a Medicaid card; and children who have applied for Medicaid or [place] Health Choice, but were declined, whose parents can pay a reduced fee at time of service.  °[facility] ° [address], [place] (336) 641-4533 Patients are seen by appointment only. Walk-ins are not accepted. [facility] will see patients 18 years of age and older. °Monday - Tuesday (8am-5pm) °Most Wednesdays (8:30-5pm) °$30 per visit, cash only  °[facility] ° [address], [place] (336) 641-4533 Patients are seen by appointment only. Walk-ins are not accepted. [facility] will see patients 18 years of age and older. °One Wednesday Evening (Monthly: Volunteer Based).  $30 per visit, cash only  °[facility]  (919) 537-3737 for adults; Children under age 4, call Graduate Pediatric Dentistry at (919) [phone]. Children aged 4-14, please call (919) [phone] to request a pediatric application. ° Dental services are provided in all areas of dental care including fillings, crowns and bridges, complete and partial dentures, implants, gum treatment, root canals, and extractions. Preventive care is also provided. Treatment is provided to both adults and children. °Patients are selected via a lottery and there is often a waiting list. °  °[facility] [address], ° ° (336) 763-8833 [email or web address] °  °[facility] [address], [place], [place] (336)723-1848, Ext. 123 Second and Fourth Thursday of each month, opens at 6:30 AM; Clinic ends at 9 AM.  Patients are seen on a first-come first-served basis, and a limited number are seen during each clinic.  ° °[facility] ° [address], [place], [place] (336) 723-7904   Eligibility Requirements °You must have lived in [place], or [place] counties for at least the last three months. °  You cannot be eligible for state or federal sponsored  healthcare insurance, including Veterans Administration, Medicaid, or Medicare. °  You generally cannot be eligible for healthcare insurance through your employer.  °  How to apply: °Eligibility screenings are held every Tuesday and Wednesday afternoon from 1:00 pm until 4:00 pm. You do not need an appointment for the interview!  °[facility] [address], [place], [place] 336-631-2330   °[facility]  336-342-8273   °[facility]  336-703-3100   °[facility]  336-570-6415   ° °  Behavioral Health Resources in the Community: °Intensive Outpatient Programs °Organization         Address  Phone  Notes  °[facility] [address], [place], [place] 336-878-6098   °[facility] [address], [place], [place] 336-832-9800   °[facility] [address], [place], [place] ° 336-882-2125   °[facility] [address],  °[place], [place] 1-800-853-5163 or 336-641-4981   °Substance Abuse Resources °Organization         Address  Phone  Notes  °[facility]  336-882-2125   °[facility]  336-784-9470   °[facility]  336-285-9073   °[facility]  336-845-3988   °Residential & Outpatient Substance Abuse Program  1-800-659-3381   °Psychological Services °Organization         Address  Phone  Notes  °[facility]  336- 832-9600   °[facility]  336- 378-7881   °[facility] [address], [place] 1-800-853-5163 or 336-641-4981   ° °Mobile Crisis Teams °Organization         Address  Phone  Notes  °[facility], Mobile Crisis Care Unit  1-877-626-1772   °Assertive °[facility] ° [address] Ali Mol[place], [place] 336-834-9664   °Sharon DeEsch [address] °[place] 336-554-5454   ° °Self-Help/Support Groups °Organization         Address  Phone              Notes  °[facility] - variety of support groups  336- 373-1402 Call for more information  °Narcotics Anonymous (NA), [facility] [address], °[place]  2 meetings at this location  ° °Residential Treatment Programs °Organization         Address  Phone  Notes  °[facility] [address],    °[place]  1-866-801-8205   °[facility] ° [address], [place], [place] 704-293-8524   °[facility] [address], [place] 336-845-3988 Admissions: 8am-3pm M-F  °[facility] [address]    °[place], [place] 336-841-1104   °[facility] [address], [place], [place] 336-379-7146   °[facility] [address]  °[place], [place] 336-285-9073   °[facility] Intensive Outpatient [address], [place], [place] 336-852-3033   °[facility] [address]  °[place], [place] 1-877-615-2722 or 336-784-9470   °[facility] [address] [place], [place] 336-227-7417 Accepts Medicaid  °[facility] [address]  °[place] 1-800-659-3381 Substance Abuse/Addiction Treatment  ° °[place] Behavioral Health Resources °Organization         Address  Phone  Notes  °[facility]  (888) 581-9988   °Julie Brannon, PhD [address] [place], [place]   (336) 349-5553 or (336) 951-0000   °[facility]   [address] °[place], [place] (336) 349-4454   °[facility] [address], [place], [place] (336) 342-8316 Insurance/Medicaid/sponsorship through [facility]  °[facility] [address]                                    [place],  (336) 342-8316 Therapy/tele-psych/case  °[facility] [address]  ° [place],   [place] (336) 349-2233    °Dr. Arfeen  (336) 349-4544   °[facility] [place]  [employer] [place] [facility] 1) [address], [place] °2) [address], [place] °3)  [address], [place] (336) 349-3220 °(336) 342-7768 ° °(336) 342-8140   °[place] [facility] (336) 342-1394 or (336) 342-3537 (After Hours)    ° ° ° °

## 2015-02-17 NOTE — ED Notes (Signed)
Provided patient a ham sandwich and drink.

## 2015-02-23 ENCOUNTER — Ambulatory Visit: Payer: Self-pay | Admitting: Infectious Disease

## 2015-03-02 ENCOUNTER — Other Ambulatory Visit: Payer: Self-pay | Admitting: Infectious Disease

## 2015-03-04 ENCOUNTER — Other Ambulatory Visit: Payer: Self-pay | Admitting: Infectious Disease

## 2015-03-04 NOTE — Telephone Encounter (Signed)
Pt requesting refill for generic Viagra.  This is not currently on the pt's medication profile. Pt's insurance is Medicaid primary.  This medication is not covered by Medicaid.  He will need to come to the office to complete PAP application if the medication if approved by Dr. Daiva EvesVan Dam.  MD please advise.

## 2015-03-06 ENCOUNTER — Other Ambulatory Visit: Payer: Self-pay | Admitting: Infectious Disease

## 2015-03-10 ENCOUNTER — Telehealth: Payer: Self-pay | Admitting: *Deleted

## 2015-03-10 ENCOUNTER — Other Ambulatory Visit: Payer: Self-pay | Admitting: Infectious Disease

## 2015-03-10 NOTE — Telephone Encounter (Signed)
Patient requesting refill of viagra - not an active medication on his list. Please advise if ok to fill and number of refills. Andree CossHowell, Michelle M, RN

## 2015-03-10 NOTE — Telephone Encounter (Signed)
I don't think we should be giving [name] Viagra given his risk for causing low blood pressures and given his chaotic lifestyle with frequent admissions for alcohol intoxication. He needs to get control of his alcoholism first

## 2015-03-10 NOTE — Telephone Encounter (Signed)
Do not feel this given his recent multiple hospitalizations for alcoholism

## 2015-03-11 ENCOUNTER — Ambulatory Visit (INDEPENDENT_AMBULATORY_CARE_PROVIDER_SITE_OTHER): Payer: Medicare Other | Admitting: Infectious Disease

## 2015-03-11 ENCOUNTER — Other Ambulatory Visit: Payer: Self-pay | Admitting: Infectious Disease

## 2015-03-11 ENCOUNTER — Encounter: Payer: Self-pay | Admitting: Infectious Disease

## 2015-03-11 ENCOUNTER — Telehealth: Payer: Self-pay | Admitting: Pharmacy Technician

## 2015-03-11 VITALS — BP 159/107 | HR 81 | Temp 98.2°F | Wt 217.0 lb

## 2015-03-11 DIAGNOSIS — F1010 Alcohol abuse, uncomplicated: Secondary | ICD-10-CM | POA: Diagnosis not present

## 2015-03-11 DIAGNOSIS — F251 Schizoaffective disorder, depressive type: Secondary | ICD-10-CM | POA: Diagnosis not present

## 2015-03-11 DIAGNOSIS — F331 Major depressive disorder, recurrent, moderate: Secondary | ICD-10-CM | POA: Diagnosis not present

## 2015-03-11 DIAGNOSIS — N529 Male erectile dysfunction, unspecified: Secondary | ICD-10-CM | POA: Diagnosis not present

## 2015-03-11 DIAGNOSIS — B20 Human immunodeficiency virus [HIV] disease: Secondary | ICD-10-CM | POA: Diagnosis not present

## 2015-03-11 DIAGNOSIS — B9562 Methicillin resistant Staphylococcus aureus infection as the cause of diseases classified elsewhere: Secondary | ICD-10-CM

## 2015-03-11 DIAGNOSIS — B182 Chronic viral hepatitis C: Secondary | ICD-10-CM | POA: Diagnosis present

## 2015-03-11 DIAGNOSIS — F17200 Nicotine dependence, unspecified, uncomplicated: Secondary | ICD-10-CM | POA: Diagnosis not present

## 2015-03-11 DIAGNOSIS — R7881 Bacteremia: Secondary | ICD-10-CM

## 2015-03-11 DIAGNOSIS — E785 Hyperlipidemia, unspecified: Secondary | ICD-10-CM | POA: Diagnosis not present

## 2015-03-11 DIAGNOSIS — F1420 Cocaine dependence, uncomplicated: Secondary | ICD-10-CM | POA: Diagnosis not present

## 2015-03-11 LAB — CBC WITH DIFFERENTIAL/PLATELET
Basophils Absolute: 0 10*3/uL (ref 0.0–0.1)
Basophils Relative: 0 % (ref 0–1)
Eosinophils Absolute: 0.1 10*3/uL (ref 0.0–0.7)
Eosinophils Relative: 3 % (ref 0–5)
HCT: 39.9 % (ref 39.0–52.0)
Hemoglobin: 12.7 g/dL — ABNORMAL LOW (ref 13.0–17.0)
Lymphocytes Relative: 26 % (ref 12–46)
Lymphs Abs: 0.9 10*3/uL (ref 0.7–4.0)
MCH: 32 pg (ref 26.0–34.0)
MCHC: 31.8 g/dL (ref 30.0–36.0)
MCV: 100.5 fL — ABNORMAL HIGH (ref 78.0–100.0)
MPV: 9.5 fL (ref 8.6–12.4)
Monocytes Absolute: 0.3 10*3/uL (ref 0.1–1.0)
Monocytes Relative: 9 % (ref 3–12)
Neutro Abs: 2.1 10*3/uL (ref 1.7–7.7)
Neutrophils Relative %: 62 % (ref 43–77)
Platelets: 106 10*3/uL — ABNORMAL LOW (ref 150–400)
RBC: 3.97 MIL/uL — ABNORMAL LOW (ref 4.22–5.81)
RDW: 13.1 % (ref 11.5–15.5)
WBC: 3.4 10*3/uL — ABNORMAL LOW (ref 4.0–10.5)

## 2015-03-11 LAB — COMPLETE METABOLIC PANEL WITH GFR
ALT: 46 U/L (ref 9–46)
AST: 76 U/L — ABNORMAL HIGH (ref 10–35)
Albumin: 3.3 g/dL — ABNORMAL LOW (ref 3.6–5.1)
Alkaline Phosphatase: 54 U/L (ref 40–115)
BUN: 10 mg/dL (ref 7–25)
CO2: 27 mmol/L (ref 20–31)
Calcium: 9 mg/dL (ref 8.6–10.3)
Chloride: 105 mmol/L (ref 98–110)
Creat: 0.85 mg/dL (ref 0.70–1.25)
GFR, Est African American: >89 mL/min (ref >=60)
GFR, Est Non African American: >89 mL/min (ref >=60)
Glucose, Bld: 83 mg/dL (ref 65–99)
Potassium: 4.2 mmol/L (ref 3.5–5.3)
Sodium: 139 mmol/L (ref 135–146)
Total Bilirubin: 0.7 mg/dL (ref 0.2–1.2)
Total Protein: 7.7 g/dL (ref 6.1–8.1)

## 2015-03-11 LAB — LIPID PANEL
Cholesterol: 101 mg/dL — ABNORMAL LOW (ref 125–200)
HDL: 37 mg/dL — ABNORMAL LOW (ref >=40)
LDL Cholesterol: 52 mg/dL (ref <130)
Total CHOL/HDL Ratio: 2.7 Ratio (ref <=5.0)
Triglycerides: 59 mg/dL (ref <150)
VLDL: 12 mg/dL (ref <30)
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
## 2015-03-11 NOTE — Patient Instructions (Addendum)
[name]    We are working on   COntinue your TRIUMEQ  STOP OMEPRAZOLE

## 2015-03-12 LAB — HIV-1 RNA QUANT-NO REFLEX-BLD
HIV 1 RNA Quant: 262 copies/mL — ABNORMAL HIGH (ref <20)
HIV-1 RNA Quant, Log: 2.42 Log copies/mL — ABNORMAL HIGH (ref <1.30)

## 2015-03-12 LAB — T-HELPER CELL (CD4) - (RCID CLINIC ONLY)
CD4 % Helper T Cell: 11 % — ABNORMAL LOW (ref 33–55)
CD4 T Cell Abs: 90 /uL — ABNORMAL LOW (ref 400–2700)

## 2015-03-12 LAB — RPR

## 2015-03-17 NOTE — Telephone Encounter (Signed)
Pt returned call.  He was advised to call his DSS Case Worker to resolve Medicaid/UHC issue.  He is very interested in starting Harvoni for his Hep C.  He repeated back that he would call his Case Worker.

## 2015-03-24 ENCOUNTER — Telehealth: Payer: Self-pay | Admitting: Pharmacy Technician

## 2015-03-26 ENCOUNTER — Ambulatory Visit (HOSPITAL_COMMUNITY): Admission: RE | Admit: 2015-03-26 | Payer: Medicaid Other | Source: Home / Self Care | Admitting: Psychiatry

## 2015-03-26 ENCOUNTER — Emergency Department (HOSPITAL_COMMUNITY)
Admission: EM | Admit: 2015-03-26 | Discharge: 2015-03-26 | Disposition: A | Discharge status: Home / Self Care | Payer: Medicare Other | Attending: Emergency Medicine | Admitting: Emergency Medicine

## 2015-03-26 DIAGNOSIS — Z008 Encounter for other general examination: Secondary | ICD-10-CM | POA: Diagnosis present

## 2015-03-26 DIAGNOSIS — Z8614 Personal history of Methicillin resistant Staphylococcus aureus infection: Secondary | ICD-10-CM | POA: Diagnosis not present

## 2015-03-26 DIAGNOSIS — F319 Bipolar disorder, unspecified: Secondary | ICD-10-CM | POA: Insufficient documentation

## 2015-03-26 DIAGNOSIS — Z720 Tobacco use: Secondary | ICD-10-CM | POA: Insufficient documentation

## 2015-03-26 DIAGNOSIS — Z8739 Personal history of other diseases of the musculoskeletal system and connective tissue: Secondary | ICD-10-CM | POA: Insufficient documentation

## 2015-03-26 DIAGNOSIS — Z87448 Personal history of other diseases of urinary system: Secondary | ICD-10-CM | POA: Diagnosis not present

## 2015-03-26 DIAGNOSIS — Z8619 Personal history of other infectious and parasitic diseases: Secondary | ICD-10-CM | POA: Diagnosis not present

## 2015-03-26 DIAGNOSIS — F1910 Other psychoactive substance abuse, uncomplicated: Secondary | ICD-10-CM

## 2015-03-26 DIAGNOSIS — J449 Chronic obstructive pulmonary disease, unspecified: Secondary | ICD-10-CM | POA: Insufficient documentation

## 2015-03-26 DIAGNOSIS — Z79899 Other long term (current) drug therapy: Secondary | ICD-10-CM | POA: Diagnosis not present

## 2015-03-26 DIAGNOSIS — Z21 Asymptomatic human immunodeficiency virus [HIV] infection status: Secondary | ICD-10-CM | POA: Insufficient documentation

## 2015-03-26 DIAGNOSIS — F1110 Opioid abuse, uncomplicated: Secondary | ICD-10-CM | POA: Diagnosis not present

## 2015-03-26 NOTE — Discharge Instructions (Signed)
°Emergency Department Resource Guide °1) Find a Doctor and Pay Out of Pocket °Although you won't have to find out who is covered by your insurance plan, it is a good idea to ask around and get recommendations. You will then need to call the office and see if the doctor you have chosen will accept you as a new patient and what types of options they offer for patients who are self-pay. Some doctors offer discounts or will set up payment plans for their patients who do not have insurance, but you will need to ask so you aren't surprised when you get to your appointment. ° °2) Contact Your Local Health Department °Not all health departments have doctors that can see patients for sick visits, but many do, so it is worth a call to see if yours does. If you don't know where your local health department is, you can check in your phone book. The CDC also has a tool to help you locate your state's health department, and many state websites also have listings of all of their local health departments. ° °3) Find a Walk-in Clinic °If your illness is not likely to be very severe or complicated, you may want to try a walk in clinic. These are popping up all over the country in pharmacies, drugstores, and shopping centers. They're usually staffed by nurse practitioners or physician assistants that have been trained to treat common illnesses and complaints. They're usually fairly quick and inexpensive. However, if you have serious medical issues or chronic medical problems, these are probably not your best option. ° °No Primary Care Doctor: °- Call [facility] at  832-8000 - they can help you locate a primary care doctor that  accepts your insurance, provides certain services, etc. °- Physician Referral Service- 1-800-533-3463 ° °Chronic Pain Problems: °Organization         Address  Phone   Notes  °[facility]  (336) 297-2271 Patients need to be referred by their primary care doctor.  ° °Medication  Assistance: °Organization         Address  Phone   Notes  °[place] [facility] [address] °[place], [place] [address] (336) 641-8030 --Must be a resident of [place] °-- Must have NO insurance coverage whatsoever (no Medicaid/ Medicare, etc.) °-- The pt. MUST have a primary care doctor that directs their care regularly and follows them in the community °  °[facility]  (866) 331-1348   °[employer]  (888) 892-1162   ° °Agencies that provide inexpensive medical care: °Organization         Address  Phone   Notes  °[facility]  (336) 832-8035   °[facility]    (336) 832-7272   °[facility] [address] °[place], [place] [address] (336) 832-4777   °[facility] [address], °[place] (336) 271-4999   °[facility]    (336) 373-0678   °[facility]    (336) 272-1050   °[facility] ° [address], [place] Phone:  (336) 832-4444, Fax:  (336) 832-4440 Hours of Operation:  9 am - 6 pm, M-F.  Also accepts Medicaid/Medicare and self-pay.  °[facility] ° [address], [place] Phone: (336) 832-3150, Fax: (336) 832-3151. Hours of Operation:  8:30 am - 5:30 pm, M-F.  Also accepts Medicaid and self-pay.  °[facility] [place] [address], [place] Phone: (336) 878-6027   °[facility] [address], [place], [place] (336)723-1848, Ext. 123 Mondays & Thursdays: 7-9 AM.  First 15 patients are seen on a first come, first serve basis. °  ° °Medicaid-accepting [place] Providers: ° °Organization         Address  Phone   Notes  °[facility] [address], [place] (336) 641-2100 Also accepts self-pay patients.  °[facility] [address], [place] ° (336) 856-9996   °[facility] [address], [place]  (336) 288-8857   °[facility] [address] [place] [address], [place] (336) 299-7000   °Veita Bland [address], [place]  ° (336) 373-1557 Only accepts [place] Access Medicaid patients after they have their name applied to their card.  ° °Self-Pay (no insurance) in [place]: ° °Organization         Address  Phone   Notes  °Sickle Cell Patients, [facility] [address], [place] (336) 832-1970   °[facility] [address], [place] (336) 832-4400   °[facility] ° [address], [place] (336) 992-4800   °[facility]/Dr. Osei-Bonsu ° [address] [place] [address], [place] or [address], [place] (336) 841-8500 Phone number for both [place] and [place] locations is the same.  °[facility] [address], [place] (336) 299-0000   °[facility] [address] [place] [address], [place] or [address] (336) 852-7530 °(336) 878-2260   °[facility] [address], [place] (336) 350-1642, phone; (336) 294-5005, fax Sees patients 1st and 3rd Saturday of every month.  Must not qualify for public or private insurance (i.e. Medicaid, Medicare, [place] Health Choice, Veterans' Benefits) • Household income should be no more than 200% of the poverty level •The clinic cannot treat you if you are pregnant or think you are pregnant • Sexually transmitted diseases are not treated at the clinic.  ° ° °Dental Care: °Organization         Address  Phone  Notes  °[place] [facility] [address], [place] (336) 641-6152 Accepts children up to age 21 who are enrolled in Medicaid or [place] Health Choice; pregnant women with a Medicaid card; and children who have applied for Medicaid or [place] Health Choice, but were declined, whose parents can pay a reduced fee at time of service.  °[place]  [facility] [place]  [address], [place] (336) 641-7733 Accepts children up to age 21 who are enrolled in Medicaid or [place] Health Choice; pregnant women with a Medicaid card; and children who have applied for Medicaid or  Health Choice, but were declined, whose parents can pay a reduced fee at time of service.  °[facility] ° [address], [place] (336) 641-4533 Patients are seen by appointment only. Walk-ins are not accepted. [facility] will see patients 18 years of age and older. °Monday - Tuesday (8am-5pm) °Most Wednesdays (8:30-5pm) °$30 per visit, cash only  °[facility] ° [address], [place] (336) 641-4533 Patients are seen by appointment only. Walk-ins are not accepted. [facility] will see patients 18 years of age and older. °One   Wednesday Evening (Monthly: Volunteer Based).  $30 per visit, cash only  °[facility]  (919) 537-3737 for adults; Children under age 4, call Graduate Pediatric Dentistry at (919) [phone]. Children aged 4-14, please call (919) [phone] to request a pediatric application. ° Dental services are provided in all areas of dental care including fillings, crowns and bridges, complete and partial dentures, implants, gum treatment, root canals, and extractions. Preventive care is also provided. Treatment is provided to both adults and children. °Patients are selected via a lottery and there is often a waiting list. °  °[facility] [address], °[place] ° (336) 763-8833 [email or web address] °  °[facility] [address], [place], [place] (336)723-1848, Ext. 123 Second and Fourth Thursday of each month, opens at 6:30 AM; Clinic ends at 9 AM.  Patients are seen on a first-come first-served basis, and a limited number are seen during each clinic.  ° °[facility] ° [address], [place], [place] (336) 723-7904    Eligibility Requirements °You must have lived in [place], [place], or [place] counties for at least the last three months. °  You cannot be eligible for state or federal sponsored healthcare insurance, including Veterans Administration, Medicaid, or Medicare. °  You generally cannot be eligible for healthcare insurance through your employer.  °  How to apply: °Eligibility screenings are held every Tuesday and Wednesday afternoon from 1:00 pm until 4:00 pm. You do not need an appointment for the interview!  °[facility] [address], [place], [place] 336-631-2330   °[facility]  336-342-8273   °[facility]  336-703-3100   °[facility]  336-570-6415   ° °Behavioral Health Resources in the Community: °Intensive Outpatient Programs °Organization         Address  Phone  Notes  °[facility] [address], [place], [place] 336-878-6098   °[facility] [address], [place], [place] 336-832-9800   °[facility] [address], [place], [place] ° 336-882-2125   °[facility] [address],  °[place], [place] 1-800-853-5163 or 336-641-4981   °Substance Abuse Resources °Organization         Address  Phone  Notes  °[facility]  336-882-2125   °[facility]  336-784-9470   °[facility]  336-285-9073   °[facility]  336-845-3988   °Residential & Outpatient Substance Abuse Program  1-800-659-3381   °Psychological Services °Organization         Address  Phone  Notes  °[facility]  336- 832-9600   °[facility]  336- 378-7881   °[facility] [address], [place] 1-800-853-5163 or 336-641-4981   ° °Mobile Crisis Teams °Organization         Address  Phone  Notes  °[facility], Mobile Crisis Care Unit  1-877-626-1772   °Assertive °[facility] ° [address]  [place], [place] 336-834-9664   °Sharon DeEsch [address] °[place] 336-554-5454   ° °Self-Help/Support Groups °Organization         Address  Phone             Notes  °[facility] - variety of support groups  336- 373-1402 Call for more information  °Narcotics Anonymous (NA), [facility] [address], °[place]  2 meetings at this location  ° °  Residential Treatment Programs °Organization         Address  Phone  Notes  °[facility] [address],    °[place]  1-866-801-8205   °[facility] ° [address], [place], [place] 704-293-8524   °[facility] [address], [place] 336-845-3988 Admissions: 8am-3pm M-F  °[facility] [address]    °[place], [place] 336-841-1104   °[facility] [address], [place], [place] 336-379-7146   °[facility] [address]  °[place], [place] 336-285-9073   °[facility] - Intensive Outpatient [address], [place], [place] 336-852-3033   °[facility] [address]  °[place], [place] 1-877-615-2722 or 336-784-9470   °[facility] [address] [place], [place] 336-227-7417 Accepts Medicaid  °[facility] [address]  °[place] 1-800-659-3381 Substance Abuse/Addiction Treatment  ° °[place] Behavioral Health Resources °Organization         Address  Phone  Notes  °[facility]  (888) 581-9988   °Julie Brannon, PhD [address] [place], [place]   (336) 349-5553 or (336) 951-0000   °[facility]   [address] °[place], [place] (336) 349-4454   °[facility] [address], [place], [place] (336) 342-8316 Insurance/Medicaid/sponsorship through [facility]  °[facility] [address]                                    [place], [place] (336) 342-8316 Therapy/tele-psych/case    °[facility] [address]  ° , [place] (336) 349-2233    °Dr. Arfeen  (336) 349-4544   °[facility] [place]  [employer] [place] [facility] 1) [address],  °2) [address], [place] °3)  [address], [place] (336) 349-3220 °(336) 342-7768 ° °(336) 342-8140   °[place] [facility] (336) 342-1394 or (336) 342-3537 (After Hours)    ° ° °
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
## 2015-04-09 ENCOUNTER — Encounter: Payer: Self-pay | Admitting: Pharmacy Technician

## 2015-04-25 ENCOUNTER — Emergency Department (HOSPITAL_COMMUNITY): Payer: Medicare Other

## 2015-04-25 ENCOUNTER — Emergency Department (HOSPITAL_COMMUNITY)
Admission: EM | Admit: 2015-04-25 | Discharge: 2015-04-25 | Disposition: A | Discharge status: Home / Self Care | Payer: Medicare Other | Attending: Emergency Medicine | Admitting: Emergency Medicine

## 2015-04-25 ENCOUNTER — Encounter (HOSPITAL_COMMUNITY): Payer: Self-pay

## 2015-04-25 DIAGNOSIS — Z8739 Personal history of other diseases of the musculoskeletal system and connective tissue: Secondary | ICD-10-CM | POA: Insufficient documentation

## 2015-04-25 DIAGNOSIS — Z87828 Personal history of other (healed) physical injury and trauma: Secondary | ICD-10-CM | POA: Diagnosis not present

## 2015-04-25 DIAGNOSIS — R200 Anesthesia of skin: Secondary | ICD-10-CM | POA: Insufficient documentation

## 2015-04-25 DIAGNOSIS — F1099 Alcohol use, unspecified with unspecified alcohol-induced disorder: Secondary | ICD-10-CM | POA: Diagnosis not present

## 2015-04-25 DIAGNOSIS — F1290 Cannabis use, unspecified, uncomplicated: Secondary | ICD-10-CM | POA: Insufficient documentation

## 2015-04-25 DIAGNOSIS — R079 Chest pain, unspecified: Secondary | ICD-10-CM | POA: Diagnosis not present

## 2015-04-25 DIAGNOSIS — F17210 Nicotine dependence, cigarettes, uncomplicated: Secondary | ICD-10-CM | POA: Diagnosis not present

## 2015-04-25 DIAGNOSIS — F1190 Opioid use, unspecified, uncomplicated: Secondary | ICD-10-CM | POA: Diagnosis not present

## 2015-04-25 DIAGNOSIS — Z8614 Personal history of Methicillin resistant Staphylococcus aureus infection: Secondary | ICD-10-CM | POA: Insufficient documentation

## 2015-04-25 DIAGNOSIS — R400 Somnolence: Secondary | ICD-10-CM | POA: Diagnosis not present

## 2015-04-25 DIAGNOSIS — F1490 Cocaine use, unspecified, uncomplicated: Secondary | ICD-10-CM | POA: Insufficient documentation

## 2015-04-25 DIAGNOSIS — Z87448 Personal history of other diseases of urinary system: Secondary | ICD-10-CM | POA: Diagnosis not present

## 2015-04-25 DIAGNOSIS — Z79899 Other long term (current) drug therapy: Secondary | ICD-10-CM | POA: Insufficient documentation

## 2015-04-25 DIAGNOSIS — F319 Bipolar disorder, unspecified: Secondary | ICD-10-CM | POA: Diagnosis not present

## 2015-04-25 DIAGNOSIS — M6281 Muscle weakness (generalized): Secondary | ICD-10-CM | POA: Diagnosis not present

## 2015-04-25 DIAGNOSIS — J449 Chronic obstructive pulmonary disease, unspecified: Secondary | ICD-10-CM | POA: Insufficient documentation

## 2015-04-25 DIAGNOSIS — B20 Human immunodeficiency virus [HIV] disease: Secondary | ICD-10-CM | POA: Insufficient documentation

## 2015-04-25 LAB — RAPID URINE DRUG SCREEN, HOSP PERFORMED
Amphetamines: NOT DETECTED
Barbiturates: NOT DETECTED
Benzodiazepines: NOT DETECTED
Cocaine: POSITIVE — AB
Opiates: NOT DETECTED
Tetrahydrocannabinol: POSITIVE — AB

## 2015-04-25 LAB — CBC
HCT: 41 % (ref 39.0–52.0)
Hemoglobin: 13.7 g/dL (ref 13.0–17.0)
MCH: 32.7 pg (ref 26.0–34.0)
MCHC: 33.4 g/dL (ref 30.0–36.0)
MCV: 97.9 fL (ref 78.0–100.0)
Platelets: 140 10*3/uL — ABNORMAL LOW (ref 150–400)
RBC: 4.19 MIL/uL — ABNORMAL LOW (ref 4.22–5.81)
RDW: 13 % (ref 11.5–15.5)
WBC: 5.3 10*3/uL (ref 4.0–10.5)

## 2015-04-25 LAB — URINE MICROSCOPIC-ADD ON
Bacteria, UA: NONE SEEN
WBC, UA: NONE SEEN WBC/hpf (ref 0–5)

## 2015-04-25 LAB — I-STAT VENOUS BLOOD GAS, ED
Acid-base deficit: 1 mmol/L (ref 0.0–2.0)
Bicarbonate: 25.4 mEq/L — ABNORMAL HIGH (ref 20.0–24.0)
O2 Saturation: 91 %
TCO2: 27 mmol/L (ref 0–100)
pCO2, Ven: 47.2 mmHg (ref 45.0–50.0)
pH, Ven: 7.339 — ABNORMAL HIGH (ref 7.250–7.300)
pO2, Ven: 65 mmHg — ABNORMAL HIGH (ref 30.0–45.0)

## 2015-04-25 LAB — URINALYSIS, ROUTINE W REFLEX MICROSCOPIC
Bilirubin Urine: NEGATIVE
Glucose, UA: NEGATIVE mg/dL
Ketones, ur: NEGATIVE mg/dL
Leukocytes, UA: NEGATIVE
Nitrite: NEGATIVE
Protein, ur: NEGATIVE mg/dL
Specific Gravity, Urine: 1.01 (ref 1.005–1.030)
pH: 5.5 (ref 5.0–8.0)

## 2015-04-25 LAB — BASIC METABOLIC PANEL
Anion gap: 9 (ref 5–15)
BUN: 9 mg/dL (ref 6–20)
CO2: 25 mmol/L (ref 22–32)
Calcium: 8.8 mg/dL — ABNORMAL LOW (ref 8.9–10.3)
Chloride: 105 mmol/L (ref 101–111)
Creatinine, Ser: 0.85 mg/dL (ref 0.61–1.24)
GFR calc Af Amer: >60 mL/min (ref >60)
GFR calc non Af Amer: >60 mL/min (ref >60)
Glucose, Bld: 86 mg/dL (ref 65–99)
Potassium: 3.5 mmol/L (ref 3.5–5.1)
Sodium: 139 mmol/L (ref 135–145)

## 2015-04-25 LAB — ETHANOL: Alcohol, Ethyl (B): 162 mg/dL — ABNORMAL HIGH

## 2015-04-25 LAB — I-STAT TROPONIN, ED
Troponin i, poc: 0.03 ng/mL (ref 0.00–0.08)
Troponin i, poc: 0.02 ng/mL (ref 0.00–0.08)

## 2015-04-25 MED ORDER — SODIUM CHLORIDE 0.9 % IV BOLUS (SEPSIS)
1000.0000 mL | Freq: Once | INTRAVENOUS | Status: AC
  Administered 2015-04-25: 1000 mL via INTRAVENOUS
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
## 2015-04-25 NOTE — ED Notes (Addendum)
Per EMS - pt c/o dull left-sided cp x 2 weeks, wraps around to back. Intermittent n/v x 2 weeks as well. CP worse w/ palpation, deep breathing, and movement Uncertain of cardiac hx. Admits to cocaine use last night, +ETOH last night and this morning. Pt has psych hx, HIV/AIDS and Hep C.   Given 324mg  aspirin.

## 2015-04-25 NOTE — Discharge Instructions (Signed)
Chest Pain Observation °It is often hard to give a specific diagnosis for the cause of chest pain. Among other possibilities your symptoms might be caused by inadequate oxygen delivery to your heart (angina). Angina that is not treated or evaluated can lead to a heart attack (myocardial infarction) or death. °Blood tests, electrocardiograms, and X-rays may have been done to help determine a possible cause of your chest pain. After evaluation and observation, your health care provider has determined that it is unlikely your pain was caused by an unstable condition that requires hospitalization. However, a full evaluation of your pain may need to be completed, with additional diagnostic testing as directed. It is very important to keep your follow-up appointments. Not keeping your follow-up appointments could result in permanent heart damage, disability, or death. If there is any problem keeping your follow-up appointments, you must call your health care provider. °HOME CARE INSTRUCTIONS  °Due to the slight chance that your pain could be angina, it is important to follow your health care provider's treatment plan and also maintain a healthy lifestyle: °· Maintain or work toward achieving a healthy weight. °· Stay physically active and exercise regularly. °· Decrease your salt intake. °· Eat a balanced, healthy diet. Talk to a dietitian to learn about heart-healthy foods. °· Increase your fiber intake by including whole grains, vegetables, fruits, and nuts in your diet. °· Avoid situations that cause stress, anger, or depression. °· Take medicines as advised by your health care provider. Report any side effects to your health care provider. Do not stop medicines or adjust the dosages on your own. °· Quit smoking. Do not use nicotine patches or gum until you check with your health care provider. °· Keep your blood pressure, blood sugar, and cholesterol levels within normal limits. °· Limit alcohol intake to no more than  1 drink per day for women who are not pregnant and 2 drinks per day for men. °· Do not abuse drugs. °SEEK IMMEDIATE MEDICAL CARE IF: °You have severe chest pain or pressure which may include symptoms such as: °· You feel pain or pressure in your arms, neck, jaw, or back. °· You have severe back or abdominal pain, feel sick to your stomach (nauseous), or throw up (vomit). °· You are sweating profusely. °· You are having a fast or irregular heartbeat. °· You feel short of breath while at rest. °· You notice increasing shortness of breath during rest, sleep, or with activity. °· You have chest pain that does not get better after rest or after taking your usual medicine. °· You wake from sleep with chest pain. °· You are unable to sleep because you cannot breathe. °· You develop a frequent cough or you are coughing up blood. °· You feel dizzy, faint, or experience extreme fatigue. °· You develop severe weakness, dizziness, fainting, or chills. °Any of these symptoms may represent a serious problem that is an emergency. Do not wait to see if the symptoms will go away. Call your local emergency services (911 in the [place] Do not drive yourself to the hospital. °MAKE SURE YOU: °· Understand these instructions. °· Will watch your condition. °· Will get help right away if you are not doing well or get worse. °  °This information is not intended to replace advice given to you by your health care provider. Make sure you discuss any questions you have with your health care provider. °  °Document Released: 06/11/2010 Document Revised: 05/14/2013 Document Reviewed: 11/08/2012 °Elsevier Interactive Patient   Education 2016 [employer]  Nonspecific Chest Pain It is often hard to find the cause of chest pain. There is always a chance that your pain could be related to something serious, such as a heart attack or a blood clot in your lungs. Chest pain can also be caused by conditions that are not life-threatening. If you have  chest pain, it is very important to follow up with your doctor.  HOME CARE  If you were prescribed an antibiotic medicine, finish it all even if you start to feel better.  Avoid any activities that cause chest pain.  Do not use any tobacco products, including cigarettes, chewing tobacco, or electronic cigarettes. If you need help quitting, ask your doctor.  Do not drink alcohol.  Take medicines only as told by your doctor.  Keep all follow-up visits as told by your doctor. This is important. This includes any further testing if your chest pain does not go away.  Your doctor may tell you to keep your head raised (elevated) while you sleep.  Make lifestyle changes as told by your doctor. These may include:  Getting regular exercise. Ask your doctor to suggest some activities that are safe for you.  Eating a heart-healthy diet. Your doctor or a diet specialist (dietitian) can help you to learn healthy eating options.  Maintaining a healthy weight.  Managing diabetes, if necessary.  Reducing stress. GET HELP IF:  Your chest pain does not go away, even after treatment.  You have a rash with blisters on your chest.  You have a fever. GET HELP RIGHT AWAY IF:  Your chest pain is worse.  You have an increasing cough, or you cough up blood.  You have severe belly (abdominal) pain.  You feel extremely weak.  You pass out (faint).  You have chills.  You have sudden, unexplained chest discomfort.  You have sudden, unexplained discomfort in your arms, back, neck, or jaw.  You have shortness of breath at any time.  You suddenly start to sweat, or your skin gets clammy.  You feel nauseous.  You vomit.  You suddenly feel light-headed or dizzy.  Your heart begins to beat quickly, or it feels like it is skipping beats. These symptoms may be an emergency. Do not wait to see if the symptoms will go away. Get medical help right away. Call your local emergency services (911  in the [place] Do not drive yourself to the hospital.   This information is not intended to replace advice given to you by your health care provider. Make sure you discuss any questions you have with your health care provider.   Follow up with your primary care provider if symptoms return. Avoid use of recreational drugs. Return to the emergency department if you experience worsening of your symptoms, difficulty breathing, loss of consciousness, fever, severe headache.

## 2015-04-25 NOTE — ED Notes (Signed)
Pt able to stand to use urinal to obtain urine specimen without difficulty

## 2015-05-07 ENCOUNTER — Ambulatory Visit: Payer: Self-pay | Admitting: Infectious Disease

## 2015-06-02 ENCOUNTER — Other Ambulatory Visit: Payer: Self-pay | Admitting: Pharmacist Clinician (PhC)/ Clinical Pharmacy Specialist

## 2015-06-02 MED ORDER — LEDIPASVIR-SOFOSBUVIR 90-400 MG PO TABS: 1.0000 | ORAL_TABLET | Freq: Every day | ORAL | Status: DC

## 2015-06-03 ENCOUNTER — Telehealth: Payer: Self-pay | Admitting: Pharmacy Technician

## 2015-06-22 ENCOUNTER — Ambulatory Visit: Payer: Self-pay | Admitting: Infectious Disease

## 2015-06-24 ENCOUNTER — Encounter (HOSPITAL_COMMUNITY): Payer: Self-pay

## 2015-06-24 ENCOUNTER — Emergency Department (HOSPITAL_COMMUNITY)
Admission: EM | Admit: 2015-06-24 | Discharge: 2015-06-24 | Disposition: A | Discharge status: Home / Self Care | Payer: Medicare Other | Attending: Emergency Medicine | Admitting: Emergency Medicine

## 2015-06-24 DIAGNOSIS — Z8739 Personal history of other diseases of the musculoskeletal system and connective tissue: Secondary | ICD-10-CM | POA: Insufficient documentation

## 2015-06-24 DIAGNOSIS — Z8614 Personal history of Methicillin resistant Staphylococcus aureus infection: Secondary | ICD-10-CM | POA: Diagnosis not present

## 2015-06-24 DIAGNOSIS — Z87448 Personal history of other diseases of urinary system: Secondary | ICD-10-CM | POA: Diagnosis not present

## 2015-06-24 DIAGNOSIS — Z87828 Personal history of other (healed) physical injury and trauma: Secondary | ICD-10-CM | POA: Insufficient documentation

## 2015-06-24 DIAGNOSIS — Z8619 Personal history of other infectious and parasitic diseases: Secondary | ICD-10-CM | POA: Insufficient documentation

## 2015-06-24 DIAGNOSIS — F10920 Alcohol use, unspecified with intoxication, uncomplicated: Secondary | ICD-10-CM

## 2015-06-24 DIAGNOSIS — F319 Bipolar disorder, unspecified: Secondary | ICD-10-CM | POA: Diagnosis not present

## 2015-06-24 DIAGNOSIS — F10129 Alcohol abuse with intoxication, unspecified: Secondary | ICD-10-CM | POA: Diagnosis present

## 2015-06-24 DIAGNOSIS — B20 Human immunodeficiency virus [HIV] disease: Secondary | ICD-10-CM | POA: Diagnosis not present

## 2015-06-24 DIAGNOSIS — F10120 Alcohol abuse with intoxication, uncomplicated: Secondary | ICD-10-CM | POA: Insufficient documentation

## 2015-06-24 DIAGNOSIS — J449 Chronic obstructive pulmonary disease, unspecified: Secondary | ICD-10-CM | POA: Diagnosis not present

## 2015-06-24 DIAGNOSIS — F17210 Nicotine dependence, cigarettes, uncomplicated: Secondary | ICD-10-CM | POA: Diagnosis not present

## 2015-06-24 DIAGNOSIS — Z653 Problems related to other legal circumstances: Secondary | ICD-10-CM | POA: Diagnosis not present

## 2015-06-24 DIAGNOSIS — Z79899 Other long term (current) drug therapy: Secondary | ICD-10-CM | POA: Diagnosis not present

## 2015-06-24 NOTE — Discharge Instructions (Signed)
Please drink only in moderation.  Please be sure to follow-up with your primary care team, or if you do not have a primary care physician, follow-up with [facility].

## 2015-06-24 NOTE — ED Notes (Signed)
Per EMS, Pt was found passed out in a field from ETOH intoxication.  EMS reports that this is the 2nd call regarding the Pt today.  Pt refusing transport until [employer] arrived and gave him the choice of jail or the hospital.  Pt was combative en route.

## 2015-06-24 NOTE — ED Notes (Addendum)
Patient screaming loudly in hallway, wanting to leave, obviously intoxicated. Notified MD

## 2015-06-24 NOTE — ED Notes (Signed)
Pt able to walk with steady gait at this time, is being discharged and escorted out by [facility] after making very obscene comments to all passing nursing staff.
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
## 2015-07-03 ENCOUNTER — Emergency Department (HOSPITAL_COMMUNITY)
Admission: EM | Admit: 2015-07-03 | Discharge: 2015-07-05 | Disposition: A | Discharge status: Home / Self Care | Payer: Medicare Other | Attending: Emergency Medicine | Admitting: Emergency Medicine

## 2015-07-03 ENCOUNTER — Encounter (HOSPITAL_COMMUNITY): Payer: Self-pay | Admitting: *Deleted

## 2015-07-03 DIAGNOSIS — Z79899 Other long term (current) drug therapy: Secondary | ICD-10-CM | POA: Insufficient documentation

## 2015-07-03 DIAGNOSIS — Z87828 Personal history of other (healed) physical injury and trauma: Secondary | ICD-10-CM | POA: Diagnosis not present

## 2015-07-03 DIAGNOSIS — Z8739 Personal history of other diseases of the musculoskeletal system and connective tissue: Secondary | ICD-10-CM | POA: Diagnosis not present

## 2015-07-03 DIAGNOSIS — Z8614 Personal history of Methicillin resistant Staphylococcus aureus infection: Secondary | ICD-10-CM | POA: Insufficient documentation

## 2015-07-03 DIAGNOSIS — F1094 Alcohol use, unspecified with alcohol-induced mood disorder: Secondary | ICD-10-CM

## 2015-07-03 DIAGNOSIS — B182 Chronic viral hepatitis C: Secondary | ICD-10-CM

## 2015-07-03 DIAGNOSIS — F1024 Alcohol dependence with alcohol-induced mood disorder: Secondary | ICD-10-CM | POA: Diagnosis not present

## 2015-07-03 DIAGNOSIS — F1014 Alcohol abuse with alcohol-induced mood disorder: Secondary | ICD-10-CM | POA: Diagnosis present

## 2015-07-03 DIAGNOSIS — J449 Chronic obstructive pulmonary disease, unspecified: Secondary | ICD-10-CM | POA: Insufficient documentation

## 2015-07-03 DIAGNOSIS — B20 Human immunodeficiency virus [HIV] disease: Secondary | ICD-10-CM

## 2015-07-03 DIAGNOSIS — F10230 Alcohol dependence with withdrawal, uncomplicated: Secondary | ICD-10-CM

## 2015-07-03 DIAGNOSIS — F1010 Alcohol abuse, uncomplicated: Secondary | ICD-10-CM

## 2015-07-03 DIAGNOSIS — K253 Acute gastric ulcer without hemorrhage or perforation: Secondary | ICD-10-CM

## 2015-07-03 DIAGNOSIS — F1910 Other psychoactive substance abuse, uncomplicated: Secondary | ICD-10-CM | POA: Diagnosis not present

## 2015-07-03 DIAGNOSIS — F17210 Nicotine dependence, cigarettes, uncomplicated: Secondary | ICD-10-CM | POA: Insufficient documentation

## 2015-07-03 DIAGNOSIS — K259 Gastric ulcer, unspecified as acute or chronic, without hemorrhage or perforation: Secondary | ICD-10-CM

## 2015-07-03 DIAGNOSIS — F250 Schizoaffective disorder, bipolar type: Secondary | ICD-10-CM | POA: Diagnosis not present

## 2015-07-03 DIAGNOSIS — F1490 Cocaine use, unspecified, uncomplicated: Secondary | ICD-10-CM

## 2015-07-03 DIAGNOSIS — Z008 Encounter for other general examination: Secondary | ICD-10-CM | POA: Diagnosis present

## 2015-07-03 DIAGNOSIS — R45851 Suicidal ideations: Secondary | ICD-10-CM | POA: Diagnosis not present

## 2015-07-03 DIAGNOSIS — D696 Thrombocytopenia, unspecified: Secondary | ICD-10-CM

## 2015-07-03 DIAGNOSIS — G609 Hereditary and idiopathic neuropathy, unspecified: Secondary | ICD-10-CM

## 2015-07-03 DIAGNOSIS — F259 Schizoaffective disorder, unspecified: Secondary | ICD-10-CM | POA: Diagnosis present

## 2015-07-03 LAB — URINALYSIS, ROUTINE W REFLEX MICROSCOPIC
Bilirubin Urine: NEGATIVE
Glucose, UA: NEGATIVE mg/dL
Hgb urine dipstick: NEGATIVE
Ketones, ur: NEGATIVE mg/dL
Leukocytes, UA: NEGATIVE
Nitrite: NEGATIVE
Protein, ur: NEGATIVE mg/dL
Specific Gravity, Urine: 1.007 (ref 1.005–1.030)
pH: 5 (ref 5.0–8.0)

## 2015-07-03 LAB — COMPREHENSIVE METABOLIC PANEL
ALT: 59 U/L (ref 17–63)
AST: 94 U/L — ABNORMAL HIGH (ref 15–41)
Albumin: 4.1 g/dL (ref 3.5–5.0)
Alkaline Phosphatase: 76 U/L (ref 38–126)
Anion gap: 11 (ref 5–15)
BUN: 12 mg/dL (ref 6–20)
CO2: 25 mmol/L (ref 22–32)
Calcium: 9.3 mg/dL (ref 8.9–10.3)
Chloride: 107 mmol/L (ref 101–111)
Creatinine, Ser: 0.86 mg/dL (ref 0.61–1.24)
GFR calc Af Amer: >60 mL/min (ref >60)
GFR calc non Af Amer: >60 mL/min (ref >60)
Glucose, Bld: 107 mg/dL — ABNORMAL HIGH (ref 65–99)
Potassium: 4.5 mmol/L (ref 3.5–5.1)
Sodium: 143 mmol/L (ref 135–145)
Total Bilirubin: 0.8 mg/dL (ref 0.3–1.2)
Total Protein: 8.8 g/dL — ABNORMAL HIGH (ref 6.5–8.1)

## 2015-07-03 LAB — CBC
HCT: 45.7 % (ref 39.0–52.0)
Hemoglobin: 14.6 g/dL (ref 13.0–17.0)
MCH: 31.7 pg (ref 26.0–34.0)
MCHC: 31.9 g/dL (ref 30.0–36.0)
MCV: 99.1 fL (ref 78.0–100.0)
Platelets: 139 10*3/uL — ABNORMAL LOW (ref 150–400)
RBC: 4.61 MIL/uL (ref 4.22–5.81)
RDW: 13.4 % (ref 11.5–15.5)
WBC: 5.5 10*3/uL (ref 4.0–10.5)

## 2015-07-03 LAB — RAPID URINE DRUG SCREEN, HOSP PERFORMED
Amphetamines: NOT DETECTED
Barbiturates: NOT DETECTED
Benzodiazepines: NOT DETECTED
Cocaine: NOT DETECTED
Opiates: NOT DETECTED
Tetrahydrocannabinol: NOT DETECTED

## 2015-07-03 LAB — ETHANOL: Alcohol, Ethyl (B): 119 mg/dL — ABNORMAL HIGH (ref <5)

## 2015-07-03 LAB — ACETAMINOPHEN LEVEL: Acetaminophen (Tylenol), Serum: <10 ug/mL — ABNORMAL LOW (ref 10–30)

## 2015-07-03 LAB — SALICYLATE LEVEL: Salicylate Lvl: <4 mg/dL (ref 2.8–30.0)

## 2015-07-03 MED ORDER — HYDROXYZINE HCL 25 MG PO TABS
25.0000 mg | ORAL_TABLET | Freq: Every evening | ORAL | Status: DC | PRN
Start: 2015-07-03 — End: 2015-07-05
  Administered 2015-07-03 – 2015-07-04 (×2): 25 mg via ORAL
  Filled 2015-07-03 (×2): qty 1

## 2015-07-03 MED ORDER — LORAZEPAM 0.5 MG PO TABS
0.5000 mg | ORAL_TABLET | Freq: Four times a day (QID) | ORAL | Status: DC | PRN
  Administered 2015-07-03: 0.5 mg via ORAL
  Filled 2015-07-03: qty 1

## 2015-07-03 MED ORDER — LORAZEPAM 1 MG PO TABS: 1.0000 mg | ORAL_TABLET | Freq: Four times a day (QID) | ORAL | Status: DC | PRN

## 2015-07-03 MED ORDER — ZIPRASIDONE MESYLATE 20 MG IM SOLR: 10.0000 mg | Freq: Four times a day (QID) | INTRAMUSCULAR | Status: DC | PRN
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
## 2015-07-03 NOTE — ED Notes (Signed)
Per EMS- [employer] arrived  For an unknown problem, unsure who called . EMS states they were told psych evaluation. Patient smells of ETOH, but denies drinking.

## 2015-07-03 NOTE — ED Notes (Signed)
Ambulated without difficulty to BR.  States "I'm sick in the head." PRN medications given for sleep/anxiety.  Fluids given.  NAD.

## 2015-07-03 NOTE — ED Notes (Signed)
Report to [facility] RN.  Transfer to SAPPU 39 in NAD.

## 2015-07-03 NOTE — ED Notes (Signed)
Report called to Fleet Contras, RN TCU

## 2015-07-03 NOTE — ED Notes (Signed)
TTS at bedside. Pt finishing lunch tray

## 2015-07-03 NOTE — ED Notes (Signed)
Sitter at bedside.

## 2015-07-03 NOTE — ED Notes (Signed)
Pt in by ems. Unknown who called. Pt repeatedly stating that "something is wrong with his mind." Sts he has been off Risperidal, Celexa, HIV meds, and some other things for 2 months. Unable to focus pt for full conversation. Sts he is hearing things that tell him to hurt himself. Did state he is NOT suicidal. Smells of ETOH, acting erractically, denies ETOH use.
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
## 2015-07-04 DIAGNOSIS — F1910 Other psychoactive substance abuse, uncomplicated: Secondary | ICD-10-CM | POA: Diagnosis not present

## 2015-07-04 DIAGNOSIS — F10230 Alcohol dependence with withdrawal, uncomplicated: Secondary | ICD-10-CM | POA: Diagnosis not present

## 2015-07-04 DIAGNOSIS — R45851 Suicidal ideations: Secondary | ICD-10-CM

## 2015-07-04 MED ORDER — RISPERIDONE 2 MG PO TABS
2.0000 mg | ORAL_TABLET | Freq: Every day | ORAL | Status: DC
  Administered 2015-07-04: 2 mg via ORAL
  Filled 2015-07-04: qty 1

## 2015-07-04 MED ORDER — CITALOPRAM HYDROBROMIDE 40 MG PO TABS
40.0000 mg | ORAL_TABLET | Freq: Every day | ORAL | Status: DC
  Administered 2015-07-04 – 2015-07-05 (×2): 40 mg via ORAL
  Filled 2015-07-04 (×2): qty 1

## 2015-07-04 NOTE — ED Notes (Signed)
Pt's affect is despairing, and his mood is depressed. He stays in his room with his head under the covers. He reports being homeless and stopped taking his HIV, and other, medications. He said that it was because he could not afford them, but they are actually free.

## 2015-07-04 NOTE — ED Notes (Signed)
Patient states he is here because "I have not had my medication."  Patient states he is hearing voices but unable to tell what they are saying. Patient wants to get back on his medicines. Q 15 minute checks in progress and maintained. Monitoring of patient continues.
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
## 2015-07-04 NOTE — Progress Notes (Signed)
D Pt. Denies SI and HI, he does admit to A and VH but cannot describe them.  No complaints of pain or discomfort noted at present time.  A Writer offered support and encouragement.  R Pt. Was given food and fluids, Pt. Remains safe on the unit.

## 2015-07-05 DIAGNOSIS — F1094 Alcohol use, unspecified with alcohol-induced mood disorder: Secondary | ICD-10-CM

## 2015-07-05 DIAGNOSIS — R45851 Suicidal ideations: Secondary | ICD-10-CM | POA: Diagnosis not present

## 2015-07-05 DIAGNOSIS — F10230 Alcohol dependence with withdrawal, uncomplicated: Secondary | ICD-10-CM | POA: Diagnosis not present

## 2015-07-05 DIAGNOSIS — F1014 Alcohol abuse with alcohol-induced mood disorder: Secondary | ICD-10-CM | POA: Diagnosis present

## 2015-07-05 DIAGNOSIS — F250 Schizoaffective disorder, bipolar type: Secondary | ICD-10-CM | POA: Diagnosis not present

## 2015-07-05 MED ORDER — RISPERIDONE 2 MG PO TABS: 2.0000 mg | ORAL_TABLET | Freq: Every day | ORAL | Status: DC

## 2015-07-05 MED ORDER — HYDROXYZINE HCL 25 MG PO TABS: 25.0000 mg | ORAL_TABLET | Freq: Four times a day (QID) | ORAL | Status: DC | PRN

## 2015-07-05 MED ORDER — CITALOPRAM HYDROBROMIDE 40 MG PO TABS: 40.0000 mg | ORAL_TABLET | Freq: Every day | ORAL | Status: DC

## 2015-07-05 NOTE — Clinical Social Work Note (Signed)
CSW met with pt to assist with discharge plans.  CSW prompted pt to discuss history and current needs.  CSW provided information and contact numbers regarding group homes in [employer]  CSW also provided pt with the number to the [facility] representative [phone]) who can help him find a group home.  Pt reported that he found a group home last week with the help of a friend who works at the group home and stated that he brought his clothes to the group home and they were supposed to wash them. Pt reported that he showed up on Thursday of last week and no one was at the group home so he could not be admitted.  Pt stated that he will follow up with this group home when he is discharged.  Pt also stated that he has been working on a "back up plan" where he can stay at a mens home in [place] for one year and only pay $200 a month.  Pt has been staying in the shed on his girlfriend's sister's property and stated that he can go back there today  if he cannot go to the new group home. He stated that his mother lives down the street from the group home and the church he likes is also close by so he is still hoping this is an option for him.  Pt discussed having court dates next week for open container charges and stated that he will be following up with these court dates next week.  Dede Query, LCSW [place] Worker - Weekend Coverage cell #: [phone]

## 2015-07-05 NOTE — Clinical Social Work Note (Signed)
CSW was provided with a phone number that was stated to be pt's group home.  CSW called the number [phone] and left a message on the voicemail to call back CSW regarding pt discharge.  No name was left on the voicemail to protect confidentiality.  Elray Buba, LCSW [facility] Clinical Social Worker - Weekend Coverage cell #: [phone]

## 2015-07-05 NOTE — Discharge Instructions (Signed)
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
## 2015-07-05 NOTE — [facility] Suicide Risk Assessment (Signed)
Suicide Risk Assessment  Discharge Assessment   [facility] Discharge Suicide Risk Assessment   Principal Problem: Schizoaffective disorder [facility]) Discharge Diagnoses:  Patient Active Problem List   Diagnosis Date Noted  . Alcohol dependence with uncomplicated withdrawal ([facility]) [F10.230] 07/05/2015    Priority: High  . Alcohol-induced mood disorder ([facility]CC) [F10.94] 07/05/2015    Priority: High  . Schizoaffective disorder ([facility]) [F25.9] 04/26/2013    Priority: High  . Alcohol abuse [F10.10] 05/30/2006    Priority: High  . 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 AIDS ([facility]) [B20] 01/20/2015  . Legal problem [Z65.3] 01/20/2015  . Chronic hepatitis C without hepatic coma ([facility]) [B18.2] 09/29/2014  . Sciatica [M54.30] 09/29/2014  . Major depression, recurrent ([facility]) [F33.9] 09/29/2014  . Erectile dysfunction [N52.9] 09/21/2011  . MRSA bacteremia [R78.81, B95.62] 09/21/2011  . 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 Human immunodeficiency virus (HIV) disease ([facility]CC) [B20] 05/30/2006  . Chronic hepatitis C virus infection ([facility]CC) [B18.2] 05/30/2006  . 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 ANEMIA-NOS [D64.9] 05/30/2006  . TOBACCO ABUSE [F17.200] 05/30/2006  . HEADACHE, TENSION [G44.209] 05/30/2006  . HYPERTENSION [I10] 05/30/2006  . EMPHYSEMA [J43.8] 05/30/2006  . ASTHMA [J45.909] 05/30/2006    Total Time spent with patient: 30 minutes  Musculoskeletal: Strength & Muscle Tone: within normal limits Gait & Station: normal Patient leans: N/A  Psychiatric Specialty Exam: Review of Systems  Constitutional: Negative.   HENT: Negative.   Eyes: Negative.   Respiratory: Negative.   Cardiovascular: Negative.   Gastrointestinal: Negative.   Genitourinary: Negative.   Musculoskeletal: Negative.   Skin: Negative.   Neurological: Negative.   Endo/Heme/Allergies: Negative.   Psychiatric/Behavioral: Positive for depression and  substance abuse.    Blood pressure 147/82, pulse 61, temperature 98.1 F (36.7 C), temperature source Oral, resp. rate 16, SpO2 97 %.There is no weight on file to calculate BMI.  General Appearance: Casual  Eye Contact::  Good  Speech:  Normal  Volume:  Normal  Mood:  Depressed, mild  Affect:  Blunt  Thought Process:  Coherent, logical  Orientation:  Full (Time, Place, and Person)  Thought Content:  WDL  Suicidal Thoughts:  No  Homicidal Thoughts:  No  Memory:  Immediate, good; recent, good; remote, good  Judgement:  Fair  Insight:  Fair  Psychomotor Activity:  Normal  Concentration:  Good  Recall:  Good  Fund of Knowledge: Good  Language: Good  Akathisia:  No  Handed:  Right  AIMS (if indicated):     Assets:  Communication Skills Desire for Improvement Resilience  ADL's:  Intact  Cognition: WNL  Sleep:      Mental Status Per Nursing Assessment::   On Admission:   Suicidal ideations, some hallucinations  Demographic Factors:  Male  Loss Factors: Legal issues  Historical Factors: NA  Risk Reduction Factors:   Living with another person, especially a relative and Positive social support  Continued Clinical Symptoms:  Depression, mild  Cognitive Features That Contribute To Risk:  None    Suicide Risk:  Minimal: No identifiable suicidal ideation.  Patients presenting with no risk factors but with morbid ruminations; may be classified as minimal risk based on the severity of the depressive symptoms    Plan Of Care/Follow-up recommendations:  Activity:  as tolerated Diet:  heart healthy diet  Ikechukwu Cerny, NP 07/05/2015, 11:56 AM

## 2015-07-07 NOTE — Telephone Encounter (Signed)
[name] with [facility] informed me that [name] just got out of jail and did not start the 2nd month of [place].

## 2015-07-09 NOTE — Telephone Encounter (Signed)
Sister will ask [name] to call me with new insurance info. To get approved for final month of [place]

## 2015-07-19 ENCOUNTER — Emergency Department (HOSPITAL_COMMUNITY): Admission: EM | Admit: 2015-07-19 | Discharge: 2015-07-19 | Payer: Medicare Other

## 2015-07-19 ENCOUNTER — Other Ambulatory Visit: Payer: Self-pay | Admitting: Infectious Disease

## 2015-07-21 ENCOUNTER — Other Ambulatory Visit: Payer: Self-pay | Admitting: Infectious Disease

## 2015-07-21 ENCOUNTER — Telehealth: Payer: Self-pay | Admitting: *Deleted

## 2015-07-21 ENCOUNTER — Emergency Department (HOSPITAL_COMMUNITY)
Admission: EM | Admit: 2015-07-21 | Discharge: 2015-07-21 | Discharge status: Against Medical Advice | Payer: Medicaid Other | Attending: Emergency Medicine | Admitting: Emergency Medicine

## 2015-07-21 ENCOUNTER — Encounter (HOSPITAL_COMMUNITY): Payer: Self-pay

## 2015-07-21 DIAGNOSIS — F10920 Alcohol use, unspecified with intoxication, uncomplicated: Secondary | ICD-10-CM

## 2015-07-21 DIAGNOSIS — Z79899 Other long term (current) drug therapy: Secondary | ICD-10-CM | POA: Insufficient documentation

## 2015-07-21 DIAGNOSIS — F17210 Nicotine dependence, cigarettes, uncomplicated: Secondary | ICD-10-CM | POA: Insufficient documentation

## 2015-07-21 DIAGNOSIS — F319 Bipolar disorder, unspecified: Secondary | ICD-10-CM | POA: Diagnosis not present

## 2015-07-21 DIAGNOSIS — Z8619 Personal history of other infectious and parasitic diseases: Secondary | ICD-10-CM | POA: Insufficient documentation

## 2015-07-21 DIAGNOSIS — J449 Chronic obstructive pulmonary disease, unspecified: Secondary | ICD-10-CM | POA: Insufficient documentation

## 2015-07-21 DIAGNOSIS — B20 Human immunodeficiency virus [HIV] disease: Secondary | ICD-10-CM | POA: Diagnosis not present

## 2015-07-21 DIAGNOSIS — Z87448 Personal history of other diseases of urinary system: Secondary | ICD-10-CM | POA: Insufficient documentation

## 2015-07-21 DIAGNOSIS — Z8739 Personal history of other diseases of the musculoskeletal system and connective tissue: Secondary | ICD-10-CM | POA: Insufficient documentation

## 2015-07-21 DIAGNOSIS — F10120 Alcohol abuse with intoxication, uncomplicated: Secondary | ICD-10-CM | POA: Diagnosis not present

## 2015-07-21 DIAGNOSIS — Z87828 Personal history of other (healed) physical injury and trauma: Secondary | ICD-10-CM | POA: Diagnosis not present

## 2015-07-21 DIAGNOSIS — Z8614 Personal history of Methicillin resistant Staphylococcus aureus infection: Secondary | ICD-10-CM | POA: Diagnosis not present

## 2015-07-21 LAB — COMPREHENSIVE METABOLIC PANEL
ALT: 62 U/L (ref 17–63)
AST: 90 U/L — ABNORMAL HIGH (ref 15–41)
Albumin: 3.5 g/dL (ref 3.5–5.0)
Alkaline Phosphatase: 63 U/L (ref 38–126)
Anion gap: 14 (ref 5–15)
BUN: 10 mg/dL (ref 6–20)
CO2: 23 mmol/L (ref 22–32)
Calcium: 9.5 mg/dL (ref 8.9–10.3)
Chloride: 105 mmol/L (ref 101–111)
Creatinine, Ser: 0.88 mg/dL (ref 0.61–1.24)
GFR calc Af Amer: >60 mL/min (ref >60)
GFR calc non Af Amer: >60 mL/min (ref >60)
Glucose, Bld: 92 mg/dL (ref 65–99)
Potassium: 3.4 mmol/L — ABNORMAL LOW (ref 3.5–5.1)
Sodium: 142 mmol/L (ref 135–145)
Total Bilirubin: 0.7 mg/dL (ref 0.3–1.2)
Total Protein: 8.4 g/dL — ABNORMAL HIGH (ref 6.5–8.1)

## 2015-07-21 LAB — CBC WITH DIFFERENTIAL/PLATELET
Basophils Absolute: 0.1 10*3/uL (ref 0.0–0.1)
Basophils Relative: 2 %
Eosinophils Absolute: 0.2 10*3/uL (ref 0.0–0.7)
Eosinophils Relative: 4 %
HCT: 43.3 % (ref 39.0–52.0)
Hemoglobin: 14.6 g/dL (ref 13.0–17.0)
Lymphocytes Relative: 32 %
Lymphs Abs: 1.7 10*3/uL (ref 0.7–4.0)
MCH: 32 pg (ref 26.0–34.0)
MCHC: 33.7 g/dL (ref 30.0–36.0)
MCV: 95 fL (ref 78.0–100.0)
Monocytes Absolute: 0.5 10*3/uL (ref 0.1–1.0)
Monocytes Relative: 9 %
Neutro Abs: 2.7 10*3/uL (ref 1.7–7.7)
Neutrophils Relative %: 53 %
Platelets: 116 10*3/uL — ABNORMAL LOW (ref 150–400)
RBC: 4.56 MIL/uL (ref 4.22–5.81)
RDW: 12.4 % (ref 11.5–15.5)
WBC: 5.2 10*3/uL (ref 4.0–10.5)

## 2015-07-21 LAB — ETHANOL: Alcohol, Ethyl (B): 257 mg/dL — ABNORMAL HIGH (ref <5)

## 2015-07-21 NOTE — Discharge Instructions (Signed)
Please see the attached list for follow-up for substance abuse problems, he needs to stop drinking alcohol, return to the ER if you have severe or worsening symptoms. Community Resource Guide Outpatient Counseling/Substance Abuse Adult The [employer] 211 is a great source of information about community services available.  Access by dialing 2-1-1 from anywhere in [place], or by website -  PooledIncome.pl.   Other Local Resources (Updated 05/2015)  Crisis Hotlines   Services     Area Served  [employer]  Crisis Hotline, available 24 hours a day, 7 days a week: 978-239-1321 [facility], [place]   [facility]  Crisis Hotline, available 24 hours a day, 7 days a week: [phone] [facility], [place]  [facility]  Suicide Prevention Hotline, available 24 hours a day, 7 days a week: (720)275-3554 [facility], [place]  [employer], available 24 hours a day, 7 days a week: [phone] [facility], [place]   [facility] Access to [employer], available 24 hours a day, 7 days a week: 848-491-1656 All   [facility]  Crisis Hotline, available 24 hours a day, 7 days a week: 774-698-6809 All   Other Local Resources (Updated 05/2015)  Outpatient Counseling/ Substance Abuse Programs  Services     Address and Phone Number  ADS ([facility]cohol [facility] Drug Services)   Options include Individual counseling, group counseling, intensive outpatient program (several hours a day, several days a week)  Offers depression assessments  Provides methadone maintenance program [phone] [address] [place], [place] [address]   [facility]   Offers partial hospitalization/day treatment and DUI/DWI programs  [employer], private insurance [phone] [address] [place], [place] [address]  [facility]    Services include intensive outpatient program (several hours a  day, several days a week), outpatient treatment, DUI/DWI services, family education  Also has some services specifically for [employer] transitional housing  [phone] [address] [place], [place] [address]     [facility]  [employer], private pay, and private insurance [phone] [address] [place], [place] [address]  [employer] [facility]  Services include individual counseling, substance abuse intensive outpatient program (several hours a day, several days a week), day treatment  Delene Loll, Medicaid, private insurance [phone] [address] [place], [place] [address]  [facility]   Offers substance abuse intensive outpatient program (several hours a day, several days a week), partial hospitalization program [phone] [address] [place], [place] [address]  [phone] [address] [place], [place] [address]  [phone] [address] [place], [place] [address]  [phone], [address] [place], [place] [address]  [facility]  Individual counseling only  Accepts private insurance only [phone] [address] [place], [place] [address]  [facility]  Methadone maintenance program [phone] [place], [place] [address]  [facility]  [facility]-In Clinic providing substance abuse and mental health counseling  Accepts Medicaid, Medicare, private insurance  Offers sliding scale for uninsured [phone] [address] [place], [place]   [facility] [place], [employer].  Offers individual counseling, and intensive in-home services [phone] [address] [place], [place] [address]  [facility] individual counseling, family counseling, group therapy, domestic violence counseling, Tour manager, Medicaid, private insurance  Offers sliding scale  for uninsured [phone] [address] [place], [place] [address]  [phone] [facility], [address] [place], [place] [address]  [facility]  Offers individual, family and group counseling  3 locations - [place], [place], and [place]  [phone]  [address] [place], [place] [address]  [address] [place], [place] [address]  [address] [place], [place] [address]  [facility]    Offers psychiatric assessment, 8-week Intensive Outpatient Program (several hours a day, several times a week, daytime or evenings), early recovery group, family Program, medication management  Private pay or private insurance only [phone], or  [phone] [address] [place], [place] [address]  [facility] [place] [employer] individual, couples and family counseling  Accepts Medicaid, private insurance, and sliding scale for uninsured [phone] [address] [place], [place] [address]  Len Blalock, MD  Individual counseling  Private insurance [phone] [address] [place], [place] [address]  [facility]   Offers assessment, substance abuse treatment, and behavioral health treatment [phone] [address] [place], [place] [address]  [facility]  Individual counseling  Accepts private insurance [phone] [address] [place], [place] [address]  [facility]  Individual counseling  Delene Loll, private insurance [phone] [address] [place], [place] [address]  [facility]    Offers intensive outpatient program (several hours a day, several times a week)  Private pay, private insurance [phone] [facility] [place], [place]  [facility]  Individual counseling  Medicare, private insurance [phone] [address] [place], [place] [address]  [facility]    Offers intensive outpatient program (several hours a day, several times a week) and partial hospitalization program [phone] [address] [place], [place] [address]  Emerson Monte, MD  Individual counseling [phone] [address] [place], [place] [address]  [facility]  Offers Christian counseling to individuals, couples, and families  Accepts Medicare and private insurance; offers sliding scale for uninsured [phone] [address] [place], [place] [address]  [facility]  [place] counseling [phone] [address] [place], [place] [address]  [facility] crisis counseling, individual counseling, group therapy, in-home therapy, domestic violence services, day treatment, DWI services, Administrator, arts (CST), Assertive Community Treatment Team (ACTT), substance abuse Intensive Outpatient Program (several hours a day, several times a week)  2 locations - [place] and [place] [phone] [address] [place], [place] [address]  [phone] [address] [place], [place] [address]  [facility]     Individual counseling and group therapy  Accepts private insurance, [place], [place] [phone] [address] [place], [place]  [facility]  Offers individual and family counseling  Offers LGBTQ services  Accepts private insurance and private pay [phone] [address] [place], [place] [address]  [facility]    Offers individual counseling, group therapy, and outpatient detox  Accepts private insurance [phone] [address] [place], [place]  [facility]  Individual counseling  Accepts Medicare, private insurance [phone] [address] [place], [place] [address]  [employer]  Individual counseling  Accepts Medicare, private insurance [phone] [address] [place], [place] [address]  [facility]   Offers substance abuse Intensive Outpatient Program (several hours a day, several times a week) [phone], or [phone] [place], [place]

## 2015-07-21 NOTE — Telephone Encounter (Signed)
Well   #1 his recent labs do not show VL <200 (done in October)  #2 his alcoholism makes me worry about rx viagra a drug that could drop his blood pressure and be dangerous when he is abusing alcohol  I think [name] needs to gain control over his drinking along with continued adherence to his HIV meds  We were actually trying to cure his Hep C but that effort failed due to his etohism  I completely trust him to take his HIV meds but that is not everything that he needs to get back on road to health  He should come in and meet with [name]
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
## 2015-07-21 NOTE — Telephone Encounter (Signed)
Patient called stating he forgot to discuss with Dr. Daiva Eves his refill on Viagra. He said he is suppressed and has had it before. Can not find it on current MD list. If ok, please add to meds and I will [facility] to [facility] [place]. Wendall Mola CMA

## 2015-07-21 NOTE — ED Notes (Signed)
Pt ambulatory w/ steady gait to lobby. Escorted by police. Pt previously urinated on floor and verbally abusive with staff.

## 2015-07-21 NOTE — ED Notes (Signed)
Patient continues to repeat "I'm not going to make it." Putting clothes on in room. Encouraged patient to get back on stretcher to rest, but patient refused.

## 2015-07-21 NOTE — Telephone Encounter (Signed)
[name], with [facility], will speak to [facility] and request that he call for an appt with [name] and that he is also due to see Dr. Daiva Eves as his last appt was 02/2015. Thank you

## 2015-07-21 NOTE — Telephone Encounter (Signed)
Perfect

## 2015-07-30 ENCOUNTER — Emergency Department (HOSPITAL_COMMUNITY)
Admission: EM | Admit: 2015-07-30 | Discharge: 2015-07-30 | Disposition: A | Discharge status: Home / Self Care | Payer: Medicare Other | Attending: Emergency Medicine | Admitting: Emergency Medicine

## 2015-07-30 ENCOUNTER — Emergency Department (HOSPITAL_COMMUNITY): Payer: Medicare Other

## 2015-07-30 ENCOUNTER — Encounter (HOSPITAL_COMMUNITY): Payer: Self-pay | Admitting: Emergency Medicine

## 2015-07-30 DIAGNOSIS — F10129 Alcohol abuse with intoxication, unspecified: Secondary | ICD-10-CM | POA: Diagnosis present

## 2015-07-30 DIAGNOSIS — Z87828 Personal history of other (healed) physical injury and trauma: Secondary | ICD-10-CM | POA: Diagnosis not present

## 2015-07-30 DIAGNOSIS — F17210 Nicotine dependence, cigarettes, uncomplicated: Secondary | ICD-10-CM | POA: Diagnosis not present

## 2015-07-30 DIAGNOSIS — F319 Bipolar disorder, unspecified: Secondary | ICD-10-CM | POA: Insufficient documentation

## 2015-07-30 DIAGNOSIS — R4781 Slurred speech: Secondary | ICD-10-CM | POA: Diagnosis not present

## 2015-07-30 DIAGNOSIS — Z8619 Personal history of other infectious and parasitic diseases: Secondary | ICD-10-CM | POA: Insufficient documentation

## 2015-07-30 DIAGNOSIS — Z79899 Other long term (current) drug therapy: Secondary | ICD-10-CM | POA: Diagnosis not present

## 2015-07-30 DIAGNOSIS — Z8614 Personal history of Methicillin resistant Staphylococcus aureus infection: Secondary | ICD-10-CM | POA: Insufficient documentation

## 2015-07-30 DIAGNOSIS — Z87448 Personal history of other diseases of urinary system: Secondary | ICD-10-CM | POA: Diagnosis not present

## 2015-07-30 DIAGNOSIS — B20 Human immunodeficiency virus [HIV] disease: Secondary | ICD-10-CM | POA: Insufficient documentation

## 2015-07-30 DIAGNOSIS — F10120 Alcohol abuse with intoxication, uncomplicated: Secondary | ICD-10-CM | POA: Diagnosis not present

## 2015-07-30 DIAGNOSIS — F10920 Alcohol use, unspecified with intoxication, uncomplicated: Secondary | ICD-10-CM

## 2015-07-30 DIAGNOSIS — J449 Chronic obstructive pulmonary disease, unspecified: Secondary | ICD-10-CM | POA: Diagnosis not present

## 2015-07-30 DIAGNOSIS — Z653 Problems related to other legal circumstances: Secondary | ICD-10-CM | POA: Diagnosis not present

## 2015-07-30 DIAGNOSIS — J069 Acute upper respiratory infection, unspecified: Secondary | ICD-10-CM

## 2015-07-30 LAB — CBC WITH DIFFERENTIAL/PLATELET
Basophils Absolute: 0 10*3/uL (ref 0.0–0.1)
Basophils Relative: 1 %
Eosinophils Absolute: 0.2 10*3/uL (ref 0.0–0.7)
Eosinophils Relative: 3 %
HCT: 42.4 % (ref 39.0–52.0)
Hemoglobin: 13.9 g/dL (ref 13.0–17.0)
Lymphocytes Relative: 35 %
Lymphs Abs: 2 10*3/uL (ref 0.7–4.0)
MCH: 32.3 pg (ref 26.0–34.0)
MCHC: 32.8 g/dL (ref 30.0–36.0)
MCV: 98.6 fL (ref 78.0–100.0)
Monocytes Absolute: 0.6 10*3/uL (ref 0.1–1.0)
Monocytes Relative: 10 %
Neutro Abs: 3 10*3/uL (ref 1.7–7.7)
Neutrophils Relative %: 51 %
Platelets: 131 10*3/uL — ABNORMAL LOW (ref 150–400)
RBC: 4.3 MIL/uL (ref 4.22–5.81)
RDW: 13.8 % (ref 11.5–15.5)
WBC: 5.7 10*3/uL (ref 4.0–10.5)

## 2015-07-30 LAB — COMPREHENSIVE METABOLIC PANEL
ALT: 56 U/L (ref 17–63)
AST: 85 U/L — ABNORMAL HIGH (ref 15–41)
Albumin: 2.8 g/dL — ABNORMAL LOW (ref 3.5–5.0)
Alkaline Phosphatase: 52 U/L (ref 38–126)
Anion gap: 11 (ref 5–15)
BUN: 12 mg/dL (ref 6–20)
CO2: 22 mmol/L (ref 22–32)
Calcium: 8.4 mg/dL — ABNORMAL LOW (ref 8.9–10.3)
Chloride: 107 mmol/L (ref 101–111)
Creatinine, Ser: 1.07 mg/dL (ref 0.61–1.24)
GFR calc Af Amer: >60 mL/min (ref >60)
GFR calc non Af Amer: >60 mL/min (ref >60)
Glucose, Bld: 95 mg/dL (ref 65–99)
Potassium: 3.6 mmol/L (ref 3.5–5.1)
Sodium: 140 mmol/L (ref 135–145)
Total Bilirubin: 0.5 mg/dL (ref 0.3–1.2)
Total Protein: 7.3 g/dL (ref 6.5–8.1)

## 2015-07-30 LAB — ETHANOL: Alcohol, Ethyl (B): 222 mg/dL — ABNORMAL HIGH (ref <5)

## 2015-07-30 MED ORDER — BENZONATATE 100 MG PO CAPS: 100.0000 mg | ORAL_CAPSULE | Freq: Three times a day (TID) | ORAL | Status: DC

## 2015-07-30 MED ORDER — DOXYCYCLINE HYCLATE 100 MG PO CAPS: 100.0000 mg | ORAL_CAPSULE | Freq: Two times a day (BID) | ORAL | Status: DC

## 2015-07-30 NOTE — Discharge Instructions (Signed)
Alcohol Intoxication Alcohol intoxication occurs when you drink enough alcohol that it affects your ability to function. It can be mild or very severe. Drinking a lot of alcohol in a short time is called binge drinking. This can be very harmful. Drinking alcohol can also be more dangerous if you are taking medicines or other drugs. Some of the effects caused by alcohol may include:  Loss of coordination.  Changes in mood and behavior.  Unclear thinking.  Trouble talking (slurred speech).  Throwing up (vomiting).  Confusion.  Slowed breathing.  Twitching and shaking (seizures).  Loss of consciousness. HOME CARE  Do not drive after drinking alcohol.  Drink enough water and fluids to keep your pee (urine) clear or pale yellow. Avoid caffeine.  Only take medicine as told by your doctor. GET HELP IF:  You throw up (vomit) many times.  You do not feel better after a few days.  You frequently have alcohol intoxication. Your doctor can help decide if you should see a substance use treatment counselor. GET HELP RIGHT AWAY IF:  You become shaky when you stop drinking.  You have twitching and shaking.  You throw up blood. It may look bright red or like coffee grounds.  You notice blood in your poop (bowel movements).  You become lightheaded or pass out (faint). MAKE SURE YOU:   Understand these instructions.  Will watch your condition.  Will get help right away if you are not doing well or get worse.   This information is not intended to replace advice given to you by your health care provider. Make sure you discuss any questions you have with your health care provider.   Document Released: 10/26/2007 Document Revised: 01/09/2013 Document Reviewed: 10/12/2012 Elsevier Interactive Patient Education 2016 Elsevier Inc.  Upper Respiratory Infection, Adult Most upper respiratory infections (URIs) are caused by a virus. A URI affects the nose, throat, and upper air  passages. The most common type of URI is often called "the common cold." HOME CARE   Take medicines only as told by your doctor.  Gargle warm saltwater or take cough drops to comfort your throat as told by your doctor.  Use a warm mist humidifier or inhale steam from a shower to increase air moisture. This may make it easier to breathe.  Drink enough fluid to keep your pee (urine) clear or pale yellow.  Eat soups and other clear broths.  Have a healthy diet.  Rest as needed.  Go back to work when your fever is gone or your doctor says it is okay.  You may need to stay home longer to avoid giving your URI to others.  You can also wear a face mask and wash your hands often to prevent spread of the virus.  Use your inhaler more if you have asthma.  Do not use any tobacco products, including cigarettes, chewing tobacco, or electronic cigarettes. If you need help quitting, ask your doctor. GET HELP IF:  You are getting worse, not better.  Your symptoms are not helped by medicine.  You have chills.  You are getting more short of breath.  You have brown or red mucus.  You have yellow or brown discharge from your nose.  You have pain in your face, especially when you bend forward.  You have a fever.  You have puffy (swollen) neck glands.  You have pain while swallowing.  You have white areas in the back of your throat. GET HELP RIGHT AWAY IF:  You have very bad or constant:  Headache.  Ear pain.  Pain in your forehead, behind your eyes, and over your cheekbones (sinus pain).  Chest pain.  You have long-lasting (chronic) lung disease and any of the following:  Wheezing.  Long-lasting cough.  Coughing up blood.  A change in your usual mucus.  You have a stiff neck.  You have changes in your:  Vision.  Hearing.  Thinking.  Mood. MAKE SURE YOU:   Understand these instructions.  Will watch your condition.  Will get help right away if you are  not doing well or get worse.   This information is not intended to replace advice given to you by your health care provider. Make sure you discuss any questions you have with your health care provider.   Document Released: 10/26/2007 Document Revised: 09/23/2014 Document Reviewed: 08/14/2013 Elsevier Interactive Patient Education 2016 [employer] Inc. Substance Abuse Treatment Programs  Intensive Outpatient Programs [facility]     [address]      [place], [place]                   [phone]       [facility] [address] [place], [place] [phone]  [facility]     (Inpatient and outpatient)     [address]           [phone]    [facility] [phone] (Suboxone and Methadone)  [address]      [place], [place] [address]      [phone]       [address] [place], [place] [phone]  [facility] (Outpatient/Inpatient, Chemical)    (insurance only) [phone]             [facility] (Groups & Residential) [place], [place] [phone]     [facility]     [address]     [place], [place]      [phone]       [facility] (for caregivers and family) [phone] [address] [place], [place] [phone]      Residential Treatment Programs [facility]      [address], [place], [place] [address]  [phone]       [facility] [address] [place], [place] [address] [phone]  [facility]        [phone]       [facility] [phone]  [facility] ([facility]             [address]                                         [place], [place]                                                [phone] or [phone]                               [facility] [place] [address] [place], [id number] [phone]  [facility]    [address]  [place], [place]     [phone]       [facility] [address] [place], [place] [phone]  [facility]   [address] [place], [place] [address]     [phone]      Admissions: 8am-3pm M-F  [facility] ([facility]S) [address] [place], [place] [phone]  [facility]: Residential Program [phone] Days)   [place], [place]      [phone] or [phone]     [facility]: [facility] [place], [place] (Walk in Hours over the weekend or by referral)  [facility] [address] [place], [place], [place] [address] [phone]  Crisis Mobile: [facility]:  [phone] (for crisis response 24 hours a day) [facility] Hotline:      [phone] Outpatient Psychiatry and Counseling  [facility]: Mobile Crisis Management 24 hours:  [phone]  [facility] of the [employer] sliding scale fee and walk in schedule: M-F 8am-12pm/1pm-3pm [address]  [place], [place] [address] [phone]  [facility] [address] [place], [place] [address] [phone]  [facility] (Formerly known as [facility] [employer])- new patient walk-in appointments available Monday - Friday 8am -3pm.          [address] [place], [place] [address] [phone] or crisis line- [phone]  [facility]/ Intensive Outpatient Therapy Program [address] [place], [place] [address] [phone]  [facility]                  Crisis Services      [phone] [address]     [place], [place] [address]                 [facility]   [facility] [phone]. [address] [place], [place] [address]   [facility] of Care          [address] [place]  [place], [place] [address]       [phone]  [facility] [address] [place], [place] [address] [phone]  [facility]    [address]    [place], [place] [address]     [phone]       Andee Poles, MD     [address]     [place] [address]     [phone]       [facility] [address] [place] [address]  [facility]     [address] [place], [place]      [phone]       [facility] Eulogio Ditch, MD [address] [place], [place] [address] [phone]  [facility]     [address]     [place], [place] [address]     [phone]       [facility] [address] [place], [place] [address] [phone] Resources for Temporary Residential Assistance/Crisis Centers  DAY CENT[facility]) M-F 8am-3pm   [address] [place], [place] [address]   [phone] Services include: laundry, barbering, support groups, case management, phone  & computer access, showers, AA/NA mtgs, mental health/substance abuse nurse, job skills class, disability information, VA assistance, spiritual classes, etc.   HOMELESS SHELTERS  [facility]     [facility]   [address] [place]     [phone]              [address] (women and children)       [address] [place], [place] [address] [phone] [email or web address] for application and process Application Required  [facility]   [address]    [place] [address]     [phone]                    [facility] [address] [place], [place] [address] [phone] application appt.) Application Required  [facility] (women only)    [address]     [place], [place] [address]     [phone]      Intake starts 6pm daily Need valid ID, SSC, & Police  report [employer] [address] [place], [place] [phone] Application Required  [facility] (men only)     [address]      [place], [place]     [phone]       [facility] (Pregnant women only) [address] [place], [place] [phone]  [facility]      [address]      [place], [place] [address]     [phone]             [facility] [address] [place], [place] [phone] 90 day commitment/SA/Application process  [facility](men only)     [address]     [place], [place]     [phone]       Check-in at [facility] [address] [place], [place] [address] [phone] Men/Women/Women and Children must be there by 7 pm  [facility] [place], [place] [phone]
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
## 2015-07-30 NOTE — ED Notes (Signed)
Patient called EMS for possible seizure.  Patient has large amount of ETOH on board at this time.  Patient is not incontinent of urine, was not postictal upon EMS arrival.

## 2015-07-30 NOTE — ED Notes (Signed)
Pt ambulated to the bathroom and around room without difficulty.

## 2015-07-30 NOTE — ED Notes (Signed)
Patient transported to X-ray 

## 2015-08-03 ENCOUNTER — Encounter (HOSPITAL_COMMUNITY): Payer: Self-pay | Admitting: Emergency Medicine

## 2015-08-03 ENCOUNTER — Emergency Department (HOSPITAL_COMMUNITY)
Admission: EM | Admit: 2015-08-03 | Discharge: 2015-08-03 | Disposition: A | Discharge status: Home / Self Care | Payer: Medicare Other | Attending: Emergency Medicine | Admitting: Emergency Medicine

## 2015-08-03 DIAGNOSIS — R4781 Slurred speech: Secondary | ICD-10-CM | POA: Diagnosis not present

## 2015-08-03 DIAGNOSIS — F1010 Alcohol abuse, uncomplicated: Secondary | ICD-10-CM | POA: Diagnosis not present

## 2015-08-03 DIAGNOSIS — R45851 Suicidal ideations: Secondary | ICD-10-CM | POA: Diagnosis present

## 2015-08-03 DIAGNOSIS — B20 Human immunodeficiency virus [HIV] disease: Secondary | ICD-10-CM | POA: Diagnosis not present

## 2015-08-03 DIAGNOSIS — F17210 Nicotine dependence, cigarettes, uncomplicated: Secondary | ICD-10-CM | POA: Insufficient documentation

## 2015-08-03 DIAGNOSIS — J449 Chronic obstructive pulmonary disease, unspecified: Secondary | ICD-10-CM | POA: Diagnosis not present

## 2015-08-03 LAB — COMPREHENSIVE METABOLIC PANEL
ALT: 57 U/L (ref 17–63)
AST: 93 U/L — ABNORMAL HIGH (ref 15–41)
Albumin: 3.4 g/dL — ABNORMAL LOW (ref 3.5–5.0)
Alkaline Phosphatase: 65 U/L (ref 38–126)
Anion gap: 12 (ref 5–15)
BUN: 8 mg/dL (ref 6–20)
CO2: 25 mmol/L (ref 22–32)
Calcium: 9.3 mg/dL (ref 8.9–10.3)
Chloride: 104 mmol/L (ref 101–111)
Creatinine, Ser: 0.89 mg/dL (ref 0.61–1.24)
GFR calc Af Amer: >60 mL/min (ref >60)
GFR calc non Af Amer: >60 mL/min (ref >60)
Glucose, Bld: 106 mg/dL — ABNORMAL HIGH (ref 65–99)
Potassium: 3.6 mmol/L (ref 3.5–5.1)
Sodium: 141 mmol/L (ref 135–145)
Total Bilirubin: 0.6 mg/dL (ref 0.3–1.2)
Total Protein: 8.8 g/dL — ABNORMAL HIGH (ref 6.5–8.1)

## 2015-08-03 LAB — CBC WITH DIFFERENTIAL/PLATELET
Basophils Absolute: 0 10*3/uL (ref 0.0–0.1)
Basophils Relative: 0 %
Eosinophils Absolute: 0.1 10*3/uL (ref 0.0–0.7)
Eosinophils Relative: 1 %
HCT: 44 % (ref 39.0–52.0)
Hemoglobin: 14.2 g/dL (ref 13.0–17.0)
Lymphocytes Relative: 25 %
Lymphs Abs: 1.2 10*3/uL (ref 0.7–4.0)
MCH: 31.5 pg (ref 26.0–34.0)
MCHC: 32.3 g/dL (ref 30.0–36.0)
MCV: 97.6 fL (ref 78.0–100.0)
Monocytes Absolute: 0.5 10*3/uL (ref 0.1–1.0)
Monocytes Relative: 10 %
Neutro Abs: 3.1 10*3/uL (ref 1.7–7.7)
Neutrophils Relative %: 64 %
Platelets: 120 10*3/uL — ABNORMAL LOW (ref 150–400)
RBC: 4.51 MIL/uL (ref 4.22–5.81)
RDW: 13.1 % (ref 11.5–15.5)
WBC: 4.9 10*3/uL (ref 4.0–10.5)

## 2015-08-03 LAB — ETHANOL: Alcohol, Ethyl (B): 239 mg/dL — ABNORMAL HIGH (ref <5)

## 2015-08-03 NOTE — ED Notes (Signed)
Pt given bus pass to get home.

## 2015-08-03 NOTE — ED Notes (Signed)
Inventorying pt's belongings: 11 cards, 1 red tobagan, 1 pair of black gloves, 3 red pens, 3 blue pen, 2 body lotion bottle, 1 bag of gillette razors, 1 jar of blue magic conditioner hair dress, 1 can of barbasol shaving car, 1 jar of vaseline, 2 packs of magnum trojan condoms(one of which was already opened), 1 dove deodorant, 1 anti-perspirant roll on spray, 1 burt classic cologne, 1 phone charger, 1 individual rough rider studded condom, 1 tube of poligrip denture adhesive, 2 bottle of shampoo and body wash, 1 small of mens degree deodorant, 5 individual razors, 1 large black comb, 5 crown condom, 1 passion fruit high performance lubricant, 2 brown paper bags folded and taped shut with 12 condoms in each, 1 red lighter, 1 black lighter, 1 magnum blood-flow sexual peak performance 16 pack of pills, 6 lifestyle condom, 3 liquid personal lubricant packet, 2 bubblegum blast high performance flavored lubricant tube, 1 bottle of elmers glue, 1 lemon sage body lotion, 4 polident smokers, 2 glide water lubricant, 1 red players card, 1 orange pen, 1 trusted lubricant condom, 1 "Celebration of Life" paper for [employer], 1 paper payment for [facility], 1 paper payment for [facility], 1 paper payment from [employer], 1 [employer] brochure w/ packet of terms, 1 yellow sticky note with number from [employer], 2 great stops business card, 1 paper towel w/ phone number of "Juicy", 7 pills bottles, 4 perscription packerts from [facility], 1 tax refund day brochure, 1 net spend credit card, 1 PL[employer] points card,4 [place] bus passes, 3 walmart gift cards, 1 business card for [facility], 1 [employer] gift card, several papers.

## 2015-08-03 NOTE — ED Notes (Signed)
Per patient, he states he is no longer suicidal. Pt is voluntary. Per ED MD pt is okay to leave ED since no IVC paperwork and patient has retracted statements. Pt left ED with steady gait.

## 2015-08-03 NOTE — ED Notes (Signed)
Called staffing for sitter, and security to wand pt.

## 2015-08-03 NOTE — ED Notes (Signed)
Pt arrives via EMS from home with suicidal ideations x1.5 weeks. Denies current plan. Pt with slurred speech. Pt known alcoholic. Last drink this AM. VSS. CBG WNL PTA.

## 2015-08-07 ENCOUNTER — Emergency Department (HOSPITAL_COMMUNITY)
Admission: EM | Admit: 2015-08-07 | Discharge: 2015-08-07 | Disposition: A | Discharge status: Home / Self Care | Payer: Medicare Other | Attending: Emergency Medicine | Admitting: Emergency Medicine

## 2015-08-07 ENCOUNTER — Encounter (HOSPITAL_COMMUNITY): Payer: Self-pay

## 2015-08-07 ENCOUNTER — Emergency Department (HOSPITAL_COMMUNITY): Payer: Medicare Other

## 2015-08-07 DIAGNOSIS — F17210 Nicotine dependence, cigarettes, uncomplicated: Secondary | ICD-10-CM | POA: Insufficient documentation

## 2015-08-07 DIAGNOSIS — Z8739 Personal history of other diseases of the musculoskeletal system and connective tissue: Secondary | ICD-10-CM | POA: Diagnosis not present

## 2015-08-07 DIAGNOSIS — R4182 Altered mental status, unspecified: Secondary | ICD-10-CM | POA: Insufficient documentation

## 2015-08-07 DIAGNOSIS — F10920 Alcohol use, unspecified with intoxication, uncomplicated: Secondary | ICD-10-CM

## 2015-08-07 DIAGNOSIS — F10120 Alcohol abuse with intoxication, uncomplicated: Secondary | ICD-10-CM | POA: Diagnosis not present

## 2015-08-07 DIAGNOSIS — Z87448 Personal history of other diseases of urinary system: Secondary | ICD-10-CM | POA: Insufficient documentation

## 2015-08-07 DIAGNOSIS — Z8614 Personal history of Methicillin resistant Staphylococcus aureus infection: Secondary | ICD-10-CM | POA: Insufficient documentation

## 2015-08-07 DIAGNOSIS — F10129 Alcohol abuse with intoxication, unspecified: Secondary | ICD-10-CM | POA: Diagnosis present

## 2015-08-07 DIAGNOSIS — B20 Human immunodeficiency virus [HIV] disease: Secondary | ICD-10-CM | POA: Diagnosis not present

## 2015-08-07 DIAGNOSIS — J449 Chronic obstructive pulmonary disease, unspecified: Secondary | ICD-10-CM | POA: Insufficient documentation

## 2015-08-07 DIAGNOSIS — Z79899 Other long term (current) drug therapy: Secondary | ICD-10-CM | POA: Diagnosis not present

## 2015-08-07 DIAGNOSIS — F1010 Alcohol abuse, uncomplicated: Secondary | ICD-10-CM

## 2015-08-07 DIAGNOSIS — B182 Chronic viral hepatitis C: Secondary | ICD-10-CM | POA: Diagnosis not present

## 2015-08-07 DIAGNOSIS — Z87828 Personal history of other (healed) physical injury and trauma: Secondary | ICD-10-CM | POA: Diagnosis not present

## 2015-08-07 LAB — CBC WITH DIFFERENTIAL/PLATELET
Basophils Absolute: 0 10*3/uL (ref 0.0–0.1)
Basophils Relative: 0 %
Eosinophils Absolute: 0.1 10*3/uL (ref 0.0–0.7)
Eosinophils Relative: 2 %
HCT: 43.2 % (ref 39.0–52.0)
Hemoglobin: 14 g/dL (ref 13.0–17.0)
Lymphocytes Relative: 38 %
Lymphs Abs: 2.3 10*3/uL (ref 0.7–4.0)
MCH: 32.3 pg (ref 26.0–34.0)
MCHC: 32.4 g/dL (ref 30.0–36.0)
MCV: 99.8 fL (ref 78.0–100.0)
Monocytes Absolute: 0.6 10*3/uL (ref 0.1–1.0)
Monocytes Relative: 10 %
Neutro Abs: 3 10*3/uL (ref 1.7–7.7)
Neutrophils Relative %: 50 %
Platelets: 138 10*3/uL — ABNORMAL LOW (ref 150–400)
RBC: 4.33 MIL/uL (ref 4.22–5.81)
RDW: 12.9 % (ref 11.5–15.5)
WBC: 6 10*3/uL (ref 4.0–10.5)

## 2015-08-07 LAB — COMPREHENSIVE METABOLIC PANEL
ALT: 65 U/L — ABNORMAL HIGH (ref 17–63)
AST: 102 U/L — ABNORMAL HIGH (ref 15–41)
Albumin: 3.4 g/dL — ABNORMAL LOW (ref 3.5–5.0)
Alkaline Phosphatase: 60 U/L (ref 38–126)
Anion gap: 9 (ref 5–15)
BUN: 13 mg/dL (ref 6–20)
CO2: 27 mmol/L (ref 22–32)
Calcium: 8.7 mg/dL — ABNORMAL LOW (ref 8.9–10.3)
Chloride: 103 mmol/L (ref 101–111)
Creatinine, Ser: 0.87 mg/dL (ref 0.61–1.24)
GFR calc Af Amer: >60 mL/min (ref >60)
GFR calc non Af Amer: >60 mL/min (ref >60)
Glucose, Bld: 81 mg/dL (ref 65–99)
Potassium: 3.8 mmol/L (ref 3.5–5.1)
Sodium: 139 mmol/L (ref 135–145)
Total Bilirubin: 0.8 mg/dL (ref 0.3–1.2)
Total Protein: 8.2 g/dL — ABNORMAL HIGH (ref 6.5–8.1)

## 2015-08-07 LAB — ETHANOL: Alcohol, Ethyl (B): 294 mg/dL — ABNORMAL HIGH (ref <5)
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
## 2015-08-07 NOTE — ED Notes (Signed)
Pt continually trying to climb out of the bed.  Security at bedside.

## 2015-08-07 NOTE — Discharge Instructions (Signed)
1. Medications: usual home medications °2. Treatment: rest, drink plenty of fluids,  °3. Follow Up: Please followup with your primary doctor in 1-2 days for discussion of your diagnoses and further evaluation after today's visit; if you do not have a primary care doctor use the resource guide provided to find one; Please return to the ER for worsening symptoms ° °

## 2015-08-07 NOTE — Progress Notes (Signed)
Pt noted upon arrival to ED to be mumbling and screaming that he did not care about going to "jail" INtoxicated   Entered in d/c instructions  Daiva EvesVan Dam, Lisette GrinderCornelius N Go on 09/08/2015 Pt is scheduled for follow up Tuesday 09/08/15 at 3:45 PM follow up visit [address] [place] [address] [phone] [facility] Schedule an appointment as soon as possible for a visit As needed This is listed as your medicaid Martiniquecarolina assigned doctor If you prefer another please contact DSS [phone] for assist with changing in DSS data base If you go to another doctor not assigned to you - you may be subject to being billed [address] [place] [id number] [phone]
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
## 2015-08-07 NOTE — ED Notes (Signed)
Continually sts "I'm not going to make."  When asked for clarification, Pt sts "I've got a lot of drugs in me."  Then, sts "nah, I just drank a lot of beer."

## 2015-08-07 NOTE — ED Notes (Addendum)
Pt noted to ambulate to Nurses' station and back to bed w/o difficulty.

## 2015-08-07 NOTE — Progress Notes (Signed)
Pt noted screaming out at nursing station requesting a sandwich while eating on a sandwich Pt being verbally aggressive to NT when informed at this time he could not have another sandwich "House Nigger" "I'm sick" Pt apologized when redirected and ask to go back to his stretcher

## 2015-08-07 NOTE — ED Notes (Signed)
Pt visualized ambulating with steady gait but oriented to self only; security at bedside to ensure pt safety.

## 2015-08-07 NOTE — ED Notes (Signed)
Pt given a 2nd Mal[place] sandwich.

## 2015-08-11 ENCOUNTER — Encounter (HOSPITAL_COMMUNITY): Payer: Self-pay | Admitting: Family Medicine

## 2015-08-11 ENCOUNTER — Emergency Department (HOSPITAL_COMMUNITY)
Admission: EM | Admit: 2015-08-11 | Discharge: 2015-08-12 | Disposition: A | Discharge status: Home / Self Care | Payer: Medicare Other | Attending: Emergency Medicine | Admitting: Emergency Medicine

## 2015-08-11 DIAGNOSIS — J449 Chronic obstructive pulmonary disease, unspecified: Secondary | ICD-10-CM | POA: Diagnosis not present

## 2015-08-11 DIAGNOSIS — F17210 Nicotine dependence, cigarettes, uncomplicated: Secondary | ICD-10-CM | POA: Insufficient documentation

## 2015-08-11 DIAGNOSIS — B20 Human immunodeficiency virus [HIV] disease: Secondary | ICD-10-CM | POA: Insufficient documentation

## 2015-08-11 DIAGNOSIS — F10129 Alcohol abuse with intoxication, unspecified: Secondary | ICD-10-CM | POA: Insufficient documentation

## 2015-08-11 NOTE — ED Notes (Signed)
Bed: [facility] Expected date:  Expected time:  Means of arrival:  Comments: EMS 

## 2015-08-11 NOTE — ED Notes (Signed)
[facility] is [facility]. Pt is stating "I am not going to make it". Pt is intoxicated and reports he drunk 2, 24 oz beers.

## 2015-08-12 DIAGNOSIS — F10129 Alcohol abuse with intoxication, unspecified: Secondary | ICD-10-CM | POA: Diagnosis not present

## 2015-08-12 LAB — RAPID URINE DRUG SCREEN, HOSP PERFORMED
Amphetamines: NOT DETECTED
Barbiturates: NOT DETECTED
Benzodiazepines: NOT DETECTED
Cocaine: NOT DETECTED
Opiates: NOT DETECTED
Tetrahydrocannabinol: NOT DETECTED

## 2015-08-12 NOTE — ED Notes (Signed)
Patient is cursing and yelling at staff, states he wants to leave, security called and patient escorted off property

## 2015-09-08 ENCOUNTER — Ambulatory Visit (INDEPENDENT_AMBULATORY_CARE_PROVIDER_SITE_OTHER): Payer: Medicare Other | Admitting: Infectious Disease

## 2015-09-08 ENCOUNTER — Encounter: Payer: Self-pay | Admitting: Infectious Disease

## 2015-09-08 VITALS — BP 132/86 | HR 127 | Temp 98.2°F | Wt 215.0 lb

## 2015-09-08 DIAGNOSIS — F1094 Alcohol use, unspecified with alcohol-induced mood disorder: Secondary | ICD-10-CM

## 2015-09-08 DIAGNOSIS — F1420 Cocaine dependence, uncomplicated: Secondary | ICD-10-CM

## 2015-09-08 DIAGNOSIS — Z653 Problems related to other legal circumstances: Secondary | ICD-10-CM

## 2015-09-08 DIAGNOSIS — B182 Chronic viral hepatitis C: Secondary | ICD-10-CM

## 2015-09-08 DIAGNOSIS — B20 Human immunodeficiency virus [HIV] disease: Secondary | ICD-10-CM

## 2015-09-08 DIAGNOSIS — F1010 Alcohol abuse, uncomplicated: Secondary | ICD-10-CM | POA: Diagnosis not present

## 2015-09-08 DIAGNOSIS — F250 Schizoaffective disorder, bipolar type: Secondary | ICD-10-CM

## 2015-09-08 DIAGNOSIS — I10 Essential (primary) hypertension: Secondary | ICD-10-CM
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
## 2015-09-09 ENCOUNTER — Telehealth: Payer: Self-pay | Admitting: *Deleted

## 2015-09-09 NOTE — Telephone Encounter (Signed)
Rehab Program asking about rx for hypertension?  Hypertension listed in 09/08/15 Office Note.  Rehab Program stated that he has had elevated B/P measurements at their facility.  Dr. Daiva EvesVan Dam was the pt to start a new medication?

## 2015-09-14 NOTE — Telephone Encounter (Signed)
I'm not especially worried about his hypertension his blood pressures not especially elevated at this point in time

## 2015-10-08 ENCOUNTER — Other Ambulatory Visit: Payer: Self-pay

## 2015-10-15 ENCOUNTER — Other Ambulatory Visit: Payer: Self-pay

## 2015-10-21 ENCOUNTER — Ambulatory Visit: Payer: Self-pay | Admitting: Infectious Disease

## 2015-11-21 ENCOUNTER — Emergency Department (HOSPITAL_COMMUNITY): Payer: Medicare Other

## 2015-11-21 ENCOUNTER — Emergency Department (HOSPITAL_COMMUNITY)
Admission: EM | Admit: 2015-11-21 | Discharge: 2015-11-21 | Disposition: A | Discharge status: Home / Self Care | Payer: Medicare Other | Attending: Emergency Medicine | Admitting: Emergency Medicine

## 2015-11-21 ENCOUNTER — Encounter (HOSPITAL_COMMUNITY): Payer: Self-pay | Admitting: Emergency Medicine

## 2015-11-21 DIAGNOSIS — G9389 Other specified disorders of brain: Secondary | ICD-10-CM | POA: Diagnosis not present

## 2015-11-21 DIAGNOSIS — F1910 Other psychoactive substance abuse, uncomplicated: Secondary | ICD-10-CM | POA: Insufficient documentation

## 2015-11-21 DIAGNOSIS — F319 Bipolar disorder, unspecified: Secondary | ICD-10-CM | POA: Insufficient documentation

## 2015-11-21 DIAGNOSIS — M86149 Other acute osteomyelitis, unspecified hand: Secondary | ICD-10-CM | POA: Diagnosis not present

## 2015-11-21 DIAGNOSIS — J189 Pneumonia, unspecified organism: Secondary | ICD-10-CM | POA: Diagnosis not present

## 2015-11-21 DIAGNOSIS — F1490 Cocaine use, unspecified, uncomplicated: Secondary | ICD-10-CM | POA: Insufficient documentation

## 2015-11-21 DIAGNOSIS — J449 Chronic obstructive pulmonary disease, unspecified: Secondary | ICD-10-CM | POA: Diagnosis not present

## 2015-11-21 DIAGNOSIS — Z79899 Other long term (current) drug therapy: Secondary | ICD-10-CM | POA: Insufficient documentation

## 2015-11-21 DIAGNOSIS — Z21 Asymptomatic human immunodeficiency virus [HIV] infection status: Secondary | ICD-10-CM | POA: Insufficient documentation

## 2015-11-21 DIAGNOSIS — F10120 Alcohol abuse with intoxication, uncomplicated: Secondary | ICD-10-CM | POA: Diagnosis present

## 2015-11-21 DIAGNOSIS — F1290 Cannabis use, unspecified, uncomplicated: Secondary | ICD-10-CM | POA: Diagnosis not present

## 2015-11-21 DIAGNOSIS — F17210 Nicotine dependence, cigarettes, uncomplicated: Secondary | ICD-10-CM | POA: Diagnosis not present

## 2015-11-21 DIAGNOSIS — F10920 Alcohol use, unspecified with intoxication, uncomplicated: Secondary | ICD-10-CM

## 2015-11-21 LAB — COMPREHENSIVE METABOLIC PANEL
ALT: 52 U/L (ref 17–63)
AST: 62 U/L — ABNORMAL HIGH (ref 15–41)
Albumin: 4 g/dL (ref 3.5–5.0)
Alkaline Phosphatase: 60 U/L (ref 38–126)
Anion gap: 6 (ref 5–15)
BUN: 16 mg/dL (ref 6–20)
CO2: 21 mmol/L — ABNORMAL LOW (ref 22–32)
Calcium: 9.2 mg/dL (ref 8.9–10.3)
Chloride: 115 mmol/L — ABNORMAL HIGH (ref 101–111)
Creatinine, Ser: 0.96 mg/dL (ref 0.61–1.24)
GFR calc Af Amer: >60 mL/min (ref >60)
GFR calc non Af Amer: >60 mL/min (ref >60)
Glucose, Bld: 101 mg/dL — ABNORMAL HIGH (ref 65–99)
Potassium: 4.2 mmol/L (ref 3.5–5.1)
Sodium: 142 mmol/L (ref 135–145)
Total Bilirubin: 0.9 mg/dL (ref 0.3–1.2)
Total Protein: 8.9 g/dL — ABNORMAL HIGH (ref 6.5–8.1)

## 2015-11-21 LAB — CBC WITH DIFFERENTIAL/PLATELET
Basophils Absolute: 0 10*3/uL (ref 0.0–0.1)
Basophils Relative: 0 %
Eosinophils Absolute: 0.1 10*3/uL (ref 0.0–0.7)
Eosinophils Relative: 2 %
HCT: 43.5 % (ref 39.0–52.0)
Hemoglobin: 15.3 g/dL (ref 13.0–17.0)
Lymphocytes Relative: 20 %
Lymphs Abs: 1.3 10*3/uL (ref 0.7–4.0)
MCH: 33 pg (ref 26.0–34.0)
MCHC: 35.2 g/dL (ref 30.0–36.0)
MCV: 94 fL (ref 78.0–100.0)
Monocytes Absolute: 0.4 10*3/uL (ref 0.1–1.0)
Monocytes Relative: 7 %
Neutro Abs: 4.4 10*3/uL (ref 1.7–7.7)
Neutrophils Relative %: 71 %
Platelets: 151 10*3/uL (ref 150–400)
RBC: 4.63 MIL/uL (ref 4.22–5.81)
RDW: 12.8 % (ref 11.5–15.5)
WBC: 6.2 10*3/uL (ref 4.0–10.5)

## 2015-11-21 LAB — LIPASE, BLOOD: Lipase: 63 U/L — ABNORMAL HIGH (ref 11–51)

## 2015-11-21 LAB — ETHANOL: Alcohol, Ethyl (B): 213 mg/dL — ABNORMAL HIGH (ref <5)

## 2015-11-21 MED ORDER — SULFAMETHOXAZOLE-TRIMETHOPRIM 800-160 MG PO TABS
1.0000 | ORAL_TABLET | Freq: Once | ORAL | Status: AC
  Administered 2015-11-21: 1 via ORAL
  Filled 2015-11-21: qty 1

## 2015-11-21 MED ORDER — LEVOFLOXACIN 500 MG PO TABS
500.0000 mg | ORAL_TABLET | Freq: Once | ORAL | Status: AC
  Administered 2015-11-21: 500 mg via ORAL
  Filled 2015-11-21: qty 1

## 2015-11-21 MED ORDER — PREDNISONE 10 MG (21) PO TBPK: 10.0000 mg | ORAL_TABLET | Freq: Every day | ORAL | Status: DC

## 2015-11-21 MED ORDER — LEVOFLOXACIN 500 MG PO TABS
500.0000 mg | ORAL_TABLET | Freq: Every day | ORAL | Status: DC
Start: 2015-11-21 — End: 2016-04-09

## 2015-11-21 MED ORDER — SULFAMETHOXAZOLE-TRIMETHOPRIM 800-160 MG PO TABS: 2.0000 | ORAL_TABLET | Freq: Four times a day (QID) | ORAL | Status: DC

## 2015-11-21 NOTE — ED Notes (Signed)
Pt sleeping. 

## 2015-11-21 NOTE — ED Notes (Signed)
Pt maintains O2 sats of 91% while ambulating.

## 2015-11-21 NOTE — ED Notes (Signed)
Pt brought in by EMS for alcohol intoxication. Pt is responsive and able to follow commands but uncooperative.

## 2015-11-21 NOTE — Discharge Instructions (Signed)
You will need to follow-up closely with your ID doctor. They will try to get in contact with you regarding follow-up. You may have a pneumonia and your ID doctor recommended treatment with antibiotics prescribed to you.  Return for worsening symptoms, including fever, difficulty breathing, or any other symptoms concerning to you.  Alcohol Abuse and Nutrition Alcohol abuse is any pattern of alcohol consumption that harms your health, relationships, or work. Alcohol abuse can affect how your body breaks down and absorbs nutrients from food by causing your liver to work abnormally. Additionally, many people who abuse alcohol do not eat enough carbohydrates, protein, fat, vitamins, and minerals. This can cause poor nutrition (malnutrition) and a lack of nutrients (nutrient deficiencies), which can lead to further complications. Nutrients that are commonly lacking (deficient) among people who abuse alcohol include:  Vitamins.  Vitamin A. This is stored in your liver. It is important for your vision, metabolism, and ability to fight off infections (immunity).  B vitamins. These include vitamins such as folate, thiamin, and niacin. These are important in new cell growth and maintenance.  Vitamin C. This plays an important role in iron absorption, wound healing, and immunity.  Vitamin D. This is produced by your liver, but you can also get vitamin D from food. Vitamin D is necessary for your body to absorb and use calcium.  Minerals.  Calcium. This is important for your bones and your heart and blood vessel (cardiovascular) function.  Iron. This is important for blood, muscle, and nervous system functioning.  Magnesium. This plays an important role in muscle and nerve function, and it helps to control blood sugar and blood pressure.  Zinc. This is important for the normal function of your nervous system and digestive system (gastrointestinal tract). Nutrition is an essential component of therapy  for alcohol abuse. Your health care provider or dietitian will work with you to design a plan that can help restore nutrients to your body and prevent potential complications. WHAT IS MY PLAN? Your dietitian may develop a specific diet plan that is based on your condition and any other complications you may have. A diet plan will commonly include:  A balanced diet.  Grains: 6-8 oz per day.  Vegetables: 2-3 cups per day.  Fruits: 1-2 cups per day.  Meat and other protein: 5-6 oz per day.  Dairy: 2-3 cups per day.  Vitamin and mineral supplements. WHAT DO I NEED TO KNOW ABOUT ALCOHOL AND NUTRITION?  Consume foods that are high in antioxidants, such as grapes, berries, nuts, green tea, and dark green and orange vegetables. This can help to counteract some of the stress that is placed on your liver by consuming alcohol.  Avoid food and drinks that are high in fat and sugar. Foods such as sugared soft drinks, salty snack foods, and candy contain empty calories. This means that they lack important nutrients such as protein, fiber, and vitamins.  Eat frequent meals and snacks. Try to eat 5-6 small meals each day.  Eat a variety of fresh fruits and vegetables each day. This will help you get plenty of water, fiber, and vitamins in your diet.  Drink plenty of water and other clear fluids. Try to drink at least 48-64 oz (1.5-2 L) of water per day.  If you are a vegetarian, eat a variety of protein-rich foods. Pair whole grains with plant-based proteins at meals and snacks to obtain the greatest nutrient benefit from your food. For example, eat rice with beans, put peanut  butter on whole-grain toast, or eat oatmeal with sunflower seeds.  Soak beans and whole grains overnight before cooking. This can help your body to absorb the nutrients more easily.  Include foods fortified with vitamins and minerals in your diet. Commonly fortified foods include milk, orange juice, cereal, and bread.  If  you are malnourished, your dietitian may recommend a high-protein, high-calorie diet. This may include:  2,000-3,000 calories (kilocalories) per day.  70-100 grams of protein per day.  Your health care provider may recommend a complete nutritional supplement beverage. This can help to restore calories, protein, and vitamins to your body. Depending on your condition, you may be advised to consume this instead of or in addition to meals.  Limit your intake of caffeine. Replace drinks like coffee and black tea with decaffeinated coffee and herbal tea.  Eat a variety of foods that are high in omega fatty acids. These include fish, nuts and seeds, and soybeans. These foods may help your liver to recover and may also stabilize your mood.  Certain medicines may cause changes in your appetite, taste, and weight. Work with your health care provider and dietitian to make any adjustments to your medicines and diet plan.  Include other healthy lifestyle choices in your daily routine.  Be physically active.  Get enough sleep.  Spend time doing activities that you enjoy.  If you are unable to take in enough food and calories by mouth, your health care provider may recommend a feeding tube. This is a tube that passes through your nose and throat, directly into your stomach. Nutritional supplement beverages can be given to you through the feeding tube to help you get the nutrients you need.  Take vitamin or mineral supplements as recommended by your health care provider. WHAT FOODS CAN I EAT? Grains Enriched pasta. Enriched rice. Fortified whole-grain bread. Fortified whole-grain cereal. Barley. Brown rice. Quinoa. Millet. Vegetables All fresh, frozen, and canned vegetables. Spinach. Kale. Artichoke. Carrots. Winter squash and pumpkin. Sweet potatoes. Broccoli. Cabbage. Cucumbers. Tomatoes. Sweet peppers. Green beans. Peas. Corn. Fruits All fresh and frozen fruits. Berries. Grapes. Mango. Papaya.  Guava. Cherries. Apples. Bananas. Peaches. Plums. Pineapple. Watermelon. Cantaloupe. Oranges. Avocado. Meats and Other Protein Sources Beef liver. Lean beef. Pork. Fresh and canned chicken. Fresh fish. Oysters. Sardines. Canned tuna. Shrimp. Eggs with yolks. Nuts and seeds. Peanut butter. Beans and lentils. Soybeans. Tofu. Dairy Whole, low-fat, and nonfat milk. Whole, low-fat, and nonfat yogurt. Cottage cheese. Sour cream. Hard and soft cheeses. Beverages Water. Herbal tea. Decaffeinated coffee. Decaffeinated green tea. 100% fruit juice. 100% vegetable juice. Instant breakfast shakes. Condiments Ketchup. Mayonnaise. Mustard. Salad dressing. Barbecue sauce. Sweets and Desserts Sugar-free ice cream. Sugar-free pudding. Sugar-free gelatin. Fats and Oils Butter. Vegetable oil, flaxseed oil, olive oil, and walnut oil. Other Complete nutrition shakes. Protein bars. Sugar-free gum. The items listed above may not be a complete list of recommended foods or beverages. Contact your dietitian for more options. WHAT FOODS ARE NOT RECOMMENDED? Grains Sugar-sweetened breakfast cereals. Flavored instant oatmeal. Fried breads. Vegetables Breaded or deep-fried vegetables. Fruits Dried fruit with added sugar. Candied fruit. Canned fruit in syrup. Meats and Other Protein Sources Breaded or deep-fried meats. Dairy Flavored milks. Fried cheese curds or fried cheese sticks. Beverages Alcohol. Sugar-sweetened soft drinks. Sugar-sweetened tea. Caffeinated coffee and tea. Condiments Sugar. Honey. Agave nectar. Molasses. Sweets and Desserts Chocolate. Cake. Cookies. Candy. Other Potato chips. Pretzels. Salted nuts. Candied nuts. The items listed above may not be a complete list of foods and  beverages to avoid. Contact your dietitian for more information.   This information is not intended to replace advice given to you by your health care provider. Make sure you discuss any questions you have with your  health care provider.   Document Released: 03/03/2005 Document Revised: 05/30/2014 Document Reviewed: 12/10/2013 Elsevier Interactive Patient Education 2016 [employer]  Alcohol Intoxication Alcohol intoxication occurs when you drink enough alcohol that it affects your ability to function. It can be mild or very severe. Drinking a lot of alcohol in a short time is called binge drinking. This can be very harmful. Drinking alcohol can also be more dangerous if you are taking medicines or other drugs. Some of the effects caused by alcohol may include:  Loss of coordination.  Changes in mood and behavior.  Unclear thinking.  Trouble talking (slurred speech).  Throwing up (vomiting).  Confusion.  Slowed breathing.  Twitching and shaking (seizures).  Loss of consciousness. HOME CARE  Do not drive after drinking alcohol.  Drink enough water and fluids to keep your pee (urine) clear or pale yellow. Avoid caffeine.  Only take medicine as told by your doctor. GET HELP IF:  You throw up (vomit) many times.  You do not feel better after a few days.  You frequently have alcohol intoxication. Your doctor can help decide if you should see a substance use treatment counselor. GET HELP RIGHT AWAY IF:  You become shaky when you stop drinking.  You have twitching and shaking.  You throw up blood. It may look bright red or like coffee grounds.  You notice blood in your poop (bowel movements).  You become lightheaded or pass out (faint). MAKE SURE YOU:   Understand these instructions.  Will watch your condition.  Will get help right away if you are not doing well or get worse.   This information is not intended to replace advice given to you by your health care provider. Make sure you discuss any questions you have with your health care provider.   Document Released: 10/26/2007 Document Revised: 01/09/2013 Document Reviewed: 10/12/2012 Elsevier Interactive Patient Education  [employer] Else[employer] Inc.

## 2015-11-21 NOTE — ED Notes (Signed)
Pt given sandwich and juice. Will attempt to ambulate patient when he appears more awake.
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
## 2015-11-21 NOTE — ED Notes (Signed)
[facility] Expected date:  Expected time:  Means of arrival:  Comments: ETOH-uncooperative

## 2015-11-23 ENCOUNTER — Telehealth: Payer: Self-pay | Admitting: *Deleted

## 2015-11-23 NOTE — Telephone Encounter (Signed)
-----   Message from Gardiner Barefootobert W Comer, MD sent at 11/22/2015 11:34 AM EDT ----- He was in the ED. Probable PCP pneumonia.  Needs to get back in to Dr. Daiva EvesVan Dam.  Maybe bridge, [facility] or [facility] if not already active ( probably is).  Needs Lennox LaityJodi, alcohol abuse.  thanks

## 2015-11-23 NOTE — Telephone Encounter (Signed)
Will send to Loni BeckwithJodi, [facility] via [facility].  Will print for [facility]. Thanks Andree CossHowell, Dezi Brauner M, RN

## 2015-12-01 ENCOUNTER — Telehealth: Payer: Self-pay | Admitting: *Deleted

## 2015-12-01 NOTE — Telephone Encounter (Signed)
[facility] requesting office notes, med list and immunization history. Patient was seen there once and has follow up appointment today. Record Release received. Records faxed to [phone]. Wendall MolaJacqueline Cockerham

## 2015-12-24 ENCOUNTER — Emergency Department (HOSPITAL_COMMUNITY)
Admission: EM | Admit: 2015-12-24 | Discharge: 2015-12-24 | Discharge status: Against Medical Advice | Payer: Medicare Other | Attending: Emergency Medicine | Admitting: Emergency Medicine

## 2015-12-24 ENCOUNTER — Encounter (HOSPITAL_COMMUNITY): Payer: Self-pay | Admitting: Emergency Medicine

## 2015-12-24 DIAGNOSIS — F10120 Alcohol abuse with intoxication, uncomplicated: Secondary | ICD-10-CM | POA: Diagnosis not present

## 2015-12-24 DIAGNOSIS — F1410 Cocaine abuse, uncomplicated: Secondary | ICD-10-CM | POA: Diagnosis not present

## 2015-12-24 DIAGNOSIS — Z79899 Other long term (current) drug therapy: Secondary | ICD-10-CM | POA: Insufficient documentation

## 2015-12-24 DIAGNOSIS — J449 Chronic obstructive pulmonary disease, unspecified: Secondary | ICD-10-CM | POA: Insufficient documentation

## 2015-12-24 DIAGNOSIS — F10920 Alcohol use, unspecified with intoxication, uncomplicated: Secondary | ICD-10-CM

## 2015-12-24 DIAGNOSIS — F17210 Nicotine dependence, cigarettes, uncomplicated: Secondary | ICD-10-CM | POA: Diagnosis not present

## 2015-12-24 DIAGNOSIS — I10 Essential (primary) hypertension: Secondary | ICD-10-CM | POA: Diagnosis not present

## 2015-12-24 DIAGNOSIS — J45909 Unspecified asthma, uncomplicated: Secondary | ICD-10-CM | POA: Diagnosis not present

## 2015-12-24 DIAGNOSIS — B20 Human immunodeficiency virus [HIV] disease: Secondary | ICD-10-CM | POA: Insufficient documentation

## 2015-12-24 DIAGNOSIS — F1290 Cannabis use, unspecified, uncomplicated: Secondary | ICD-10-CM | POA: Diagnosis not present

## 2015-12-24 DIAGNOSIS — R4182 Altered mental status, unspecified: Secondary | ICD-10-CM | POA: Diagnosis present

## 2015-12-24 LAB — CBC WITH DIFFERENTIAL/PLATELET
Basophils Absolute: 0 10*3/uL (ref 0.0–0.1)
Basophils Relative: 1 %
Eosinophils Absolute: 0.1 10*3/uL (ref 0.0–0.7)
Eosinophils Relative: 4 %
HCT: 36.4 % — ABNORMAL LOW (ref 39.0–52.0)
Hemoglobin: 12.1 g/dL — ABNORMAL LOW (ref 13.0–17.0)
Lymphocytes Relative: 29 %
Lymphs Abs: 1.1 10*3/uL (ref 0.7–4.0)
MCH: 32.4 pg (ref 26.0–34.0)
MCHC: 33.2 g/dL (ref 30.0–36.0)
MCV: 97.3 fL (ref 78.0–100.0)
Monocytes Absolute: 0.3 10*3/uL (ref 0.1–1.0)
Monocytes Relative: 9 %
Neutro Abs: 2.2 10*3/uL (ref 1.7–7.7)
Neutrophils Relative %: 57 %
Platelets: 112 10*3/uL — ABNORMAL LOW (ref 150–400)
RBC: 3.74 MIL/uL — ABNORMAL LOW (ref 4.22–5.81)
RDW: 13 % (ref 11.5–15.5)
WBC: 3.7 10*3/uL — ABNORMAL LOW (ref 4.0–10.5)

## 2015-12-24 LAB — COMPREHENSIVE METABOLIC PANEL
ALT: 53 U/L (ref 17–63)
AST: 75 U/L — ABNORMAL HIGH (ref 15–41)
Albumin: 3.5 g/dL (ref 3.5–5.0)
Alkaline Phosphatase: 52 U/L (ref 38–126)
Anion gap: 7 (ref 5–15)
BUN: 15 mg/dL (ref 6–20)
CO2: 24 mmol/L (ref 22–32)
Calcium: 9 mg/dL (ref 8.9–10.3)
Chloride: 111 mmol/L (ref 101–111)
Creatinine, Ser: 1.12 mg/dL (ref 0.61–1.24)
GFR calc Af Amer: >60 mL/min (ref >60)
GFR calc non Af Amer: >60 mL/min (ref >60)
Glucose, Bld: 104 mg/dL — ABNORMAL HIGH (ref 65–99)
Potassium: 3.9 mmol/L (ref 3.5–5.1)
Sodium: 142 mmol/L (ref 135–145)
Total Bilirubin: 0.7 mg/dL (ref 0.3–1.2)
Total Protein: 7.9 g/dL (ref 6.5–8.1)

## 2015-12-24 LAB — ETHANOL: Alcohol, Ethyl (B): 160 mg/dL — ABNORMAL HIGH (ref <5)

## 2015-12-24 LAB — POC OCCULT BLOOD, ED: Fecal Occult Bld: NEGATIVE

## 2015-12-24 MED ORDER — SODIUM CHLORIDE 0.9 % IV BOLUS (SEPSIS)
1000.0000 mL | Freq: Once | INTRAVENOUS | Status: AC
  Administered 2015-12-24: 1000 mL via INTRAVENOUS

## 2015-12-24 NOTE — ED Notes (Signed)
Patient remains asleep even after raising head of bed and offering food.  VSS.  Responds to voice, wakes up then falls asleep again.
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
## 2015-12-24 NOTE — ED Notes (Signed)
Patient walked out of ED.  Nurse tried to stop him but he continued walking.  Removed IV by himself.  MD notified.

## 2015-12-24 NOTE — ED Triage Notes (Signed)
Patient found on sidewalk by EMS and brought to hospital.  Pupils are pinpoint but patient able to verbalize that he had been drinking ETOH.  CBG-100.  Patient responds to voice, but is sleeping on and off.

## 2015-12-24 NOTE — ED Notes (Signed)
MD at bedside. 

## 2016-01-08 ENCOUNTER — Telehealth: Payer: Self-pay | Admitting: *Deleted

## 2016-01-08 NOTE — Telephone Encounter (Signed)
Got it! Appreciate this referral guys!!

## 2016-01-08 NOTE — Telephone Encounter (Signed)
Patient walked into clinic stating he was out of his Hiv meds and was told by [facility] to come here. [facility] needs [facility] and Denny Peonrin is not in on Friday. He will come back on Monday to apply for patient assistance. Wendall MolaJacqueline Cockerham

## 2016-01-12 ENCOUNTER — Telehealth: Payer: Self-pay | Admitting: *Deleted

## 2016-01-12 DIAGNOSIS — B20 Human immunodeficiency virus [HIV] disease: Secondary | ICD-10-CM

## 2016-01-12 MED ORDER — ABACAVIR-DOLUTEGRAVIR-LAMIVUD 600-50-300 MG PO TABS: 1.0000 | ORAL_TABLET | Freq: Every day | ORAL | 11 refills | Status: DC

## 2016-01-12 NOTE — Telephone Encounter (Signed)
Lost his bottle of Triumeq.  Sent a new prescription of his pharmacy.  It was time for a new prescription.

## 2016-01-17 NOTE — Telephone Encounter (Signed)
Thanks Angelique Blonderenise, I believe he is in substance abuse program possibly? I am willing to get him further refills but we need to know wha tis going on with him

## 2016-01-18 ENCOUNTER — Encounter (HOSPITAL_COMMUNITY): Payer: Self-pay | Admitting: Emergency Medicine

## 2016-01-18 ENCOUNTER — Emergency Department (HOSPITAL_COMMUNITY)
Admission: EM | Admit: 2016-01-18 | Discharge: 2016-01-18 | Disposition: A | Discharge status: Home / Self Care | Payer: Medicare Other | Attending: Emergency Medicine | Admitting: Emergency Medicine

## 2016-01-18 DIAGNOSIS — B20 Human immunodeficiency virus [HIV] disease: Secondary | ICD-10-CM | POA: Insufficient documentation

## 2016-01-18 DIAGNOSIS — F1290 Cannabis use, unspecified, uncomplicated: Secondary | ICD-10-CM | POA: Diagnosis not present

## 2016-01-18 DIAGNOSIS — J45909 Unspecified asthma, uncomplicated: Secondary | ICD-10-CM | POA: Insufficient documentation

## 2016-01-18 DIAGNOSIS — F1490 Cocaine use, unspecified, uncomplicated: Secondary | ICD-10-CM | POA: Insufficient documentation

## 2016-01-18 DIAGNOSIS — F10920 Alcohol use, unspecified with intoxication, uncomplicated: Secondary | ICD-10-CM

## 2016-01-18 DIAGNOSIS — F17210 Nicotine dependence, cigarettes, uncomplicated: Secondary | ICD-10-CM | POA: Diagnosis not present

## 2016-01-18 DIAGNOSIS — J449 Chronic obstructive pulmonary disease, unspecified: Secondary | ICD-10-CM | POA: Insufficient documentation

## 2016-01-18 DIAGNOSIS — Z7952 Long term (current) use of systemic steroids: Secondary | ICD-10-CM | POA: Insufficient documentation

## 2016-01-18 DIAGNOSIS — F10120 Alcohol abuse with intoxication, uncomplicated: Secondary | ICD-10-CM | POA: Diagnosis not present

## 2016-01-18 DIAGNOSIS — I10 Essential (primary) hypertension: Secondary | ICD-10-CM | POA: Diagnosis not present

## 2016-01-18 DIAGNOSIS — Z79899 Other long term (current) drug therapy: Secondary | ICD-10-CM | POA: Insufficient documentation

## 2016-01-18 DIAGNOSIS — Z792 Long term (current) use of antibiotics: Secondary | ICD-10-CM | POA: Insufficient documentation

## 2016-01-18 MED ORDER — CITALOPRAM HYDROBROMIDE 10 MG PO TABS
40.0000 mg | ORAL_TABLET | Freq: Every day | ORAL | Status: DC
  Administered 2016-01-18: 40 mg via ORAL
  Filled 2016-01-18: qty 4

## 2016-01-18 MED ORDER — VITAMIN B-1 100 MG PO TABS
100.0000 mg | ORAL_TABLET | Freq: Once | ORAL | Status: AC
  Administered 2016-01-18: 100 mg via ORAL
  Filled 2016-01-18: qty 1

## 2016-01-18 MED ORDER — RISPERIDONE 2 MG PO TABS
2.0000 mg | ORAL_TABLET | Freq: Every day | ORAL | Status: DC
  Administered 2016-01-18: 2 mg via ORAL
  Filled 2016-01-18: qty 1
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
## 2016-01-18 NOTE — ED Notes (Signed)
Bed: WTR5 Expected date:  Expected time:  Means of arrival:  Comments: 

## 2016-01-18 NOTE — ED Notes (Signed)
Pt presents with [employer], pt loud, unwilling to follow commands, will not stay in his room. Pt states he has not taken his meds in several weeks. Pt rambling stating he killed his wife, stating he is in the [place], stating he is a racist and doesn't like white or black people.  Pt will follow commands if sternly addressed.

## 2016-01-18 NOTE — ED Notes (Signed)
Pt provided bus pass, food and verbalized understanding of d/c instructions. Pt walked to parking lot by security.
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
## 2016-01-18 NOTE — ED Notes (Signed)
Bed: [facility] Expected date:  Expected time:  Means of arrival:  Comments: 

## 2016-01-21 ENCOUNTER — Other Ambulatory Visit: Payer: Self-pay

## 2016-01-26 ENCOUNTER — Other Ambulatory Visit: Payer: Self-pay | Admitting: Infectious Disease

## 2016-01-26 ENCOUNTER — Other Ambulatory Visit (HOSPITAL_COMMUNITY)
Admission: RE | Admit: 2016-01-26 | Discharge: 2016-01-26 | Disposition: A | Discharge status: Home / Self Care | Payer: Medicare Other | Source: Ambulatory Visit | Attending: Infectious Disease | Admitting: Infectious Disease

## 2016-01-26 ENCOUNTER — Other Ambulatory Visit: Payer: Medicare Other

## 2016-01-26 DIAGNOSIS — B182 Chronic viral hepatitis C: Secondary | ICD-10-CM

## 2016-01-26 DIAGNOSIS — Z113 Encounter for screening for infections with a predominantly sexual mode of transmission: Secondary | ICD-10-CM | POA: Diagnosis present

## 2016-01-26 DIAGNOSIS — B20 Human immunodeficiency virus [HIV] disease: Secondary | ICD-10-CM

## 2016-01-26 DIAGNOSIS — E785 Hyperlipidemia, unspecified: Secondary | ICD-10-CM

## 2016-01-26 LAB — CBC WITH DIFFERENTIAL/PLATELET
Basophils Absolute: 0 cells/uL (ref 0–200)
Basophils Relative: 0 %
Eosinophils Absolute: 96 cells/uL (ref 15–500)
Eosinophils Relative: 2 %
HCT: 45 % (ref 38.5–50.0)
Hemoglobin: 14.9 g/dL (ref 13.2–17.1)
Lymphocytes Relative: 18 %
Lymphs Abs: 864 cells/uL (ref 850–3900)
MCH: 32 pg (ref 27.0–33.0)
MCHC: 33.1 g/dL (ref 32.0–36.0)
MCV: 96.8 fL (ref 80.0–100.0)
MPV: 9.5 fL (ref 7.5–12.5)
Monocytes Absolute: 432 cells/uL (ref 200–950)
Monocytes Relative: 9 %
Neutro Abs: 3408 cells/uL (ref 1500–7800)
Neutrophils Relative %: 71 %
Platelets: 143 10*3/uL (ref 140–400)
RBC: 4.65 MIL/uL (ref 4.20–5.80)
RDW: 13.7 % (ref 11.0–15.0)
WBC: 4.8 10*3/uL (ref 3.8–10.8)

## 2016-01-26 LAB — COMPLETE METABOLIC PANEL WITH GFR
ALT: 47 U/L — ABNORMAL HIGH (ref 9–46)
AST: 84 U/L — ABNORMAL HIGH (ref 10–35)
Albumin: 3.7 g/dL (ref 3.6–5.1)
Alkaline Phosphatase: 69 U/L (ref 40–115)
BUN: 12 mg/dL (ref 7–25)
CO2: 22 mmol/L (ref 20–31)
Calcium: 9.3 mg/dL (ref 8.6–10.3)
Chloride: 101 mmol/L (ref 98–110)
Creat: 0.92 mg/dL (ref 0.70–1.25)
GFR, Est African American: >89 mL/min (ref >=60)
GFR, Est Non African American: 89 mL/min (ref >=60)
Glucose, Bld: 93 mg/dL (ref 65–99)
Potassium: 3.6 mmol/L (ref 3.5–5.3)
Sodium: 138 mmol/L (ref 135–146)
Total Bilirubin: 1 mg/dL (ref 0.2–1.2)
Total Protein: 9 g/dL — ABNORMAL HIGH (ref 6.1–8.1)

## 2016-01-27 ENCOUNTER — Other Ambulatory Visit: Payer: Self-pay

## 2016-01-27 LAB — URINE CYTOLOGY ANCILLARY ONLY
Chlamydia: NEGATIVE
Neisseria Gonorrhea: NEGATIVE

## 2016-01-27 LAB — T-HELPER CELL (CD4) - (RCID CLINIC ONLY)
CD4 % Helper T Cell: 12 % — ABNORMAL LOW (ref 33–55)
CD4 T Cell Abs: 120 /uL — ABNORMAL LOW (ref 400–2700)

## 2016-01-28 ENCOUNTER — Other Ambulatory Visit: Payer: Self-pay | Admitting: Infectious Disease

## 2016-01-28 DIAGNOSIS — B20 Human immunodeficiency virus [HIV] disease: Secondary | ICD-10-CM

## 2016-01-28 LAB — HEPATITIS C RNA QUANTITATIVE
HCV Quantitative Log: 6.33 {Log} — ABNORMAL HIGH (ref <1.18)
HCV Quantitative: 2158760 IU/mL — ABNORMAL HIGH (ref <15)

## 2016-01-28 LAB — HIV-1 RNA QUANT-NO REFLEX-BLD
HIV 1 RNA Quant: 1071 copies/mL — ABNORMAL HIGH (ref <20)
HIV-1 RNA Quant, Log: 3.03 Log copies/mL — ABNORMAL HIGH (ref <1.30)

## 2016-01-28 NOTE — Progress Notes (Signed)
Patient is failing again. We need to add a genotype (added to blood already drawn) and he needs close followup

## 2016-01-29 ENCOUNTER — Other Ambulatory Visit: Payer: Self-pay | Admitting: Infectious Disease

## 2016-01-29 NOTE — Progress Notes (Signed)
Orders relayed to Clydie BraunKaren in Lab.  Will 'cc Minh as well. Andree CossHowell, Michelle M, RN

## 2016-01-29 NOTE — Progress Notes (Signed)
Excellent

## 2016-02-02 ENCOUNTER — Other Ambulatory Visit: Payer: Self-pay | Admitting: Infectious Disease

## 2016-02-03 ENCOUNTER — Telehealth: Payer: Self-pay | Admitting: *Deleted

## 2016-02-03 ENCOUNTER — Ambulatory Visit (INDEPENDENT_AMBULATORY_CARE_PROVIDER_SITE_OTHER): Payer: Medicare Other | Admitting: Infectious Disease

## 2016-02-03 ENCOUNTER — Ambulatory Visit: Payer: Medicare Other | Admitting: *Deleted

## 2016-02-03 ENCOUNTER — Encounter: Payer: Self-pay | Admitting: Infectious Disease

## 2016-02-03 VITALS — BP 126/84 | HR 65 | Temp 97.6°F | Wt 218.0 lb

## 2016-02-03 DIAGNOSIS — F1094 Alcohol use, unspecified with alcohol-induced mood disorder: Secondary | ICD-10-CM | POA: Diagnosis not present

## 2016-02-03 DIAGNOSIS — F1010 Alcohol abuse, uncomplicated: Secondary | ICD-10-CM

## 2016-02-03 DIAGNOSIS — F1420 Cocaine dependence, uncomplicated: Secondary | ICD-10-CM

## 2016-02-03 DIAGNOSIS — B20 Human immunodeficiency virus [HIV] disease: Secondary | ICD-10-CM

## 2016-02-03 DIAGNOSIS — F250 Schizoaffective disorder, bipolar type: Secondary | ICD-10-CM | POA: Diagnosis not present

## 2016-02-03 DIAGNOSIS — N529 Male erectile dysfunction, unspecified: Secondary | ICD-10-CM

## 2016-02-03 DIAGNOSIS — B182 Chronic viral hepatitis C: Secondary | ICD-10-CM | POA: Diagnosis not present

## 2016-02-03 DIAGNOSIS — I10 Essential (primary) hypertension: Secondary | ICD-10-CM

## 2016-02-03 MED ORDER — SULFAMETHOXAZOLE-TRIMETHOPRIM 800-160 MG PO TABS: 2.0000 | ORAL_TABLET | Freq: Every day | ORAL | 11 refills | Status: DC

## 2016-02-03 NOTE — Patient Instructions (Signed)
We will check your labs today  Make appt with Pharmacy in next 2-3 weeks  Repeat blood work in one month  [facility] [place]

## 2016-02-03 NOTE — Telephone Encounter (Signed)
Not when he is dizzy and not taking his meds consistently and homeless

## 2016-02-03 NOTE — Telephone Encounter (Signed)
Patient requesting refill of viagra. This is no longer on his medication list. Please advise. [name], Johnathan Wiesman M, RN
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
## 2016-02-06 LAB — HIV RNA, RTPCR W/R GT (RTI, PI,INT)
HIV 1 RNA Quant: 367 copies/mL — ABNORMAL HIGH
HIV-1 RNA Quant, Log: 2.56 Log copies/mL — ABNORMAL HIGH

## 2016-02-08 ENCOUNTER — Other Ambulatory Visit: Payer: Self-pay | Admitting: Infectious Disease

## 2016-02-08 ENCOUNTER — Telehealth: Payer: Self-pay | Admitting: *Deleted

## 2016-02-08 NOTE — Telephone Encounter (Signed)
If [name] comes to his pharmacy visit and shows himself to be finally getting suppressed we can look at this I am not sure why they dcd it in house

## 2016-02-08 NOTE — Telephone Encounter (Signed)
Patient requests refill of gabapentin - this was discontinued during hospitalization. Please advise. Andree CossHowell, Basem Yannuzzi M, RN

## 2016-02-09 LAB — HIV-1 GENOTYPING (RTI,PI,IN INHBTR): HIV-1 Genotype: DETECTED — AB

## 2016-02-14 ENCOUNTER — Other Ambulatory Visit: Payer: Self-pay | Admitting: Infectious Disease

## 2016-02-16 ENCOUNTER — Other Ambulatory Visit: Payer: Self-pay | Admitting: Infectious Disease

## 2016-02-17 ENCOUNTER — Telehealth: Payer: Self-pay | Admitting: *Deleted

## 2016-02-17 NOTE — Telephone Encounter (Signed)
[facility].Prior to making a attempt to contact the patient I have discussed what may be the best options for [name] with Mitch([facility] Bridge Counselor) who is currently assisting the patient. RN contacted [name] today to offer my services and to remind him of his upcoming Pharmacy appt on the 4th of October.

## 2016-02-24 ENCOUNTER — Ambulatory Visit: Payer: Self-pay

## 2016-03-03 ENCOUNTER — Other Ambulatory Visit: Payer: Self-pay

## 2016-03-03 ENCOUNTER — Ambulatory Visit: Payer: Self-pay

## 2016-03-08 ENCOUNTER — Emergency Department (HOSPITAL_COMMUNITY)
Admission: EM | Admit: 2016-03-08 | Discharge: 2016-03-08 | Disposition: A | Discharge status: Home / Self Care | Payer: Medicare Other | Attending: Dermatology | Admitting: Dermatology

## 2016-03-08 ENCOUNTER — Encounter (HOSPITAL_COMMUNITY): Payer: Self-pay | Admitting: *Deleted

## 2016-03-08 DIAGNOSIS — F10120 Alcohol abuse with intoxication, uncomplicated: Secondary | ICD-10-CM | POA: Insufficient documentation

## 2016-03-08 DIAGNOSIS — Z5321 Procedure and treatment not carried out due to patient leaving prior to being seen by health care provider: Secondary | ICD-10-CM | POA: Insufficient documentation

## 2016-03-08 NOTE — ED Triage Notes (Signed)
Per EMS, they received a phone call stating the pt had fallen and hit head. When EMS arrived pt reported being sick and needing to see a doctor. Pt admitted to drinking 5 40oz Colt 45 beers.

## 2016-03-08 NOTE — ED Notes (Signed)
Bed: [facility] Expected date:  Expected time:  Means of arrival:  Comments: EMS/ETOH 

## 2016-03-08 NOTE — ED Notes (Signed)
Pt continually walking around nurses station, needing redirection and then getting back up, walking around, being loud. Says he has to leave. Ripped off gown, and put on his clothing. EDP [name](Yao) made aware pt was leaving.

## 2016-03-10 ENCOUNTER — Ambulatory Visit: Payer: Self-pay

## 2016-03-15 ENCOUNTER — Other Ambulatory Visit: Payer: Self-pay | Admitting: Infectious Disease

## 2016-03-26 ENCOUNTER — Emergency Department (HOSPITAL_COMMUNITY)
Admission: EM | Admit: 2016-03-26 | Discharge: 2016-03-26 | Disposition: A | Discharge status: Home / Self Care | Payer: Medicare Other | Attending: Emergency Medicine | Admitting: Emergency Medicine

## 2016-03-26 ENCOUNTER — Encounter (HOSPITAL_COMMUNITY): Payer: Self-pay

## 2016-03-26 DIAGNOSIS — J449 Chronic obstructive pulmonary disease, unspecified: Secondary | ICD-10-CM | POA: Insufficient documentation

## 2016-03-26 DIAGNOSIS — F17210 Nicotine dependence, cigarettes, uncomplicated: Secondary | ICD-10-CM | POA: Diagnosis not present

## 2016-03-26 DIAGNOSIS — F10120 Alcohol abuse with intoxication, uncomplicated: Secondary | ICD-10-CM | POA: Insufficient documentation

## 2016-03-26 DIAGNOSIS — I10 Essential (primary) hypertension: Secondary | ICD-10-CM | POA: Diagnosis not present

## 2016-03-26 DIAGNOSIS — F10920 Alcohol use, unspecified with intoxication, uncomplicated: Secondary | ICD-10-CM

## 2016-03-26 DIAGNOSIS — F1010 Alcohol abuse, uncomplicated: Secondary | ICD-10-CM | POA: Diagnosis present
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
## 2016-03-26 NOTE — ED Notes (Signed)
Patient states last use of EtOH was today.  He drinks vodka and beer (40 oz) daily.  Patient states he is uncertain where he lives.  He asked me to call several people to see if they would come get him, but nobody answered the phone at the numbers he provided.
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
## 2016-03-26 NOTE — ED Notes (Addendum)
Patient ate a breakfast tray and tried to call several more people.  He called one person and stated, "Do you have my money."  The person on the other end apparently hung up.  Uncertain as to whom he called.  Patient will be discharged with a bus pass.

## 2016-03-26 NOTE — ED Notes (Signed)
Patient ambulated from [facility] to front doors of ED without any difficulty.  Shown how to get to the bus.

## 2016-03-30 ENCOUNTER — Ambulatory Visit: Payer: Self-pay | Admitting: Infectious Disease

## 2016-03-30 ENCOUNTER — Other Ambulatory Visit: Payer: Medicare Other

## 2016-03-30 DIAGNOSIS — B20 Human immunodeficiency virus [HIV] disease: Secondary | ICD-10-CM

## 2016-03-30 DIAGNOSIS — Z79899 Other long term (current) drug therapy: Secondary | ICD-10-CM

## 2016-03-30 LAB — COMPLETE METABOLIC PANEL WITH GFR
ALT: 31 U/L (ref 9–46)
AST: 62 U/L — ABNORMAL HIGH (ref 10–35)
Albumin: 3.4 g/dL — ABNORMAL LOW (ref 3.6–5.1)
Alkaline Phosphatase: 66 U/L (ref 40–115)
BUN: 9 mg/dL (ref 7–25)
CO2: 25 mmol/L (ref 20–31)
Calcium: 8.7 mg/dL (ref 8.6–10.3)
Chloride: 103 mmol/L (ref 98–110)
Creat: 0.82 mg/dL (ref 0.70–1.25)
GFR, Est African American: >89 mL/min (ref >=60)
GFR, Est Non African American: >89 mL/min (ref >=60)
Glucose, Bld: 103 mg/dL — ABNORMAL HIGH (ref 65–99)
Potassium: 3.3 mmol/L — ABNORMAL LOW (ref 3.5–5.3)
Sodium: 139 mmol/L (ref 135–146)
Total Bilirubin: 0.8 mg/dL (ref 0.2–1.2)
Total Protein: 7.9 g/dL (ref 6.1–8.1)

## 2016-03-30 LAB — CBC WITH DIFFERENTIAL/PLATELET
Basophils Absolute: 0 cells/uL (ref 0–200)
Basophils Relative: 0 %
Eosinophils Absolute: 147 cells/uL (ref 15–500)
Eosinophils Relative: 3 %
HCT: 45 % (ref 38.5–50.0)
Hemoglobin: 14.8 g/dL (ref 13.2–17.1)
Lymphocytes Relative: 16 %
Lymphs Abs: 784 cells/uL — ABNORMAL LOW (ref 850–3900)
MCH: 32.2 pg (ref 27.0–33.0)
MCHC: 32.9 g/dL (ref 32.0–36.0)
MCV: 98 fL (ref 80.0–100.0)
MPV: 9.7 fL (ref 7.5–12.5)
Monocytes Absolute: 392 cells/uL (ref 200–950)
Monocytes Relative: 8 %
Neutro Abs: 3577 cells/uL (ref 1500–7800)
Neutrophils Relative %: 73 %
Platelets: 135 10*3/uL — ABNORMAL LOW (ref 140–400)
RBC: 4.59 MIL/uL (ref 4.20–5.80)
RDW: 14 % (ref 11.0–15.0)
WBC: 4.9 10*3/uL (ref 3.8–10.8)

## 2016-03-30 LAB — LIPID PANEL
Cholesterol: 109 mg/dL (ref <200)
HDL: 42 mg/dL (ref >40)
LDL Cholesterol: 52 mg/dL
Total CHOL/HDL Ratio: 2.6 Ratio (ref <5.0)
Triglycerides: 76 mg/dL (ref <150)
VLDL: 15 mg/dL (ref <30)

## 2016-03-31 LAB — T-HELPER CELL (CD4) - (RCID CLINIC ONLY)
CD4 % Helper T Cell: 10 % — ABNORMAL LOW (ref 33–55)
CD4 T Cell Abs: 90 /uL — ABNORMAL LOW (ref 400–2700)

## 2016-03-31 LAB — HIV-1 RNA ULTRAQUANT REFLEX TO GENTYP+
HIV 1 RNA Quant: <20 copies/mL (ref <20)
HIV-1 RNA Quant, Log: <1.3 Log copies/mL (ref <1.30)

## 2016-03-31 LAB — RPR

## 2016-04-05 ENCOUNTER — Other Ambulatory Visit: Payer: Self-pay | Admitting: Infectious Disease

## 2016-04-09 ENCOUNTER — Encounter (HOSPITAL_COMMUNITY): Payer: Self-pay | Admitting: Emergency Medicine

## 2016-04-09 ENCOUNTER — Emergency Department (HOSPITAL_COMMUNITY): Payer: Medicare Other

## 2016-04-09 ENCOUNTER — Observation Stay (HOSPITAL_COMMUNITY)
Admission: EM | Admit: 2016-04-09 | Discharge: 2016-04-12 | Disposition: A | Discharge status: Home / Self Care | Payer: Medicare Other | Attending: Internal Medicine | Admitting: Internal Medicine

## 2016-04-09 DIAGNOSIS — R072 Precordial pain: Secondary | ICD-10-CM | POA: Diagnosis not present

## 2016-04-09 DIAGNOSIS — B20 Human immunodeficiency virus [HIV] disease: Secondary | ICD-10-CM | POA: Diagnosis present

## 2016-04-09 DIAGNOSIS — F1910 Other psychoactive substance abuse, uncomplicated: Secondary | ICD-10-CM | POA: Diagnosis present

## 2016-04-09 DIAGNOSIS — J449 Chronic obstructive pulmonary disease, unspecified: Secondary | ICD-10-CM | POA: Insufficient documentation

## 2016-04-09 DIAGNOSIS — F17210 Nicotine dependence, cigarettes, uncomplicated: Secondary | ICD-10-CM | POA: Insufficient documentation

## 2016-04-09 DIAGNOSIS — Z79899 Other long term (current) drug therapy: Secondary | ICD-10-CM | POA: Diagnosis not present

## 2016-04-09 DIAGNOSIS — F1010 Alcohol abuse, uncomplicated: Secondary | ICD-10-CM | POA: Diagnosis present

## 2016-04-09 DIAGNOSIS — F10920 Alcohol use, unspecified with intoxication, uncomplicated: Secondary | ICD-10-CM | POA: Diagnosis not present

## 2016-04-09 DIAGNOSIS — R079 Chest pain, unspecified: Secondary | ICD-10-CM | POA: Diagnosis present

## 2016-04-09 DIAGNOSIS — R0789 Other chest pain: Principal | ICD-10-CM | POA: Insufficient documentation

## 2016-04-09 DIAGNOSIS — I10 Essential (primary) hypertension: Secondary | ICD-10-CM | POA: Diagnosis not present

## 2016-04-09 DIAGNOSIS — F10929 Alcohol use, unspecified with intoxication, unspecified: Secondary | ICD-10-CM | POA: Diagnosis present

## 2016-04-09 DIAGNOSIS — R778 Other specified abnormalities of plasma proteins: Secondary | ICD-10-CM

## 2016-04-09 DIAGNOSIS — R7989 Other specified abnormal findings of blood chemistry: Secondary | ICD-10-CM

## 2016-04-09 DIAGNOSIS — F339 Major depressive disorder, recurrent, unspecified: Secondary | ICD-10-CM | POA: Diagnosis present

## 2016-04-09 DIAGNOSIS — F259 Schizoaffective disorder, unspecified: Secondary | ICD-10-CM | POA: Diagnosis present

## 2016-04-09 HISTORY — DX: Cocaine use, unspecified, uncomplicated: F14.90

## 2016-04-09 HISTORY — DX: Other psychoactive substance abuse, uncomplicated: F19.10

## 2016-04-09 LAB — CBC WITH DIFFERENTIAL/PLATELET
Basophils Absolute: 0 10*3/uL (ref 0.0–0.1)
Basophils Relative: 0 %
Eosinophils Absolute: 0.2 10*3/uL (ref 0.0–0.7)
Eosinophils Relative: 2 %
HCT: 41.1 % (ref 39.0–52.0)
Hemoglobin: 14.1 g/dL (ref 13.0–17.0)
Lymphocytes Relative: 23 %
Lymphs Abs: 1.9 10*3/uL (ref 0.7–4.0)
MCH: 32.8 pg (ref 26.0–34.0)
MCHC: 34.3 g/dL (ref 30.0–36.0)
MCV: 95.6 fL (ref 78.0–100.0)
Monocytes Absolute: 0.8 10*3/uL (ref 0.1–1.0)
Monocytes Relative: 10 %
Neutro Abs: 5.5 10*3/uL (ref 1.7–7.7)
Neutrophils Relative %: 65 %
Platelets: 149 10*3/uL — ABNORMAL LOW (ref 150–400)
RBC: 4.3 MIL/uL (ref 4.22–5.81)
RDW: 13.2 % (ref 11.5–15.5)
WBC: 8.5 10*3/uL (ref 4.0–10.5)

## 2016-04-09 LAB — BASIC METABOLIC PANEL
Anion gap: 8 (ref 5–15)
BUN: 13 mg/dL (ref 6–20)
CO2: 23 mmol/L (ref 22–32)
Calcium: 9.3 mg/dL (ref 8.9–10.3)
Chloride: 103 mmol/L (ref 101–111)
Creatinine, Ser: 0.9 mg/dL (ref 0.61–1.24)
GFR calc Af Amer: >60 mL/min (ref >60)
GFR calc non Af Amer: >60 mL/min (ref >60)
Glucose, Bld: 102 mg/dL — ABNORMAL HIGH (ref 65–99)
Potassium: 4 mmol/L (ref 3.5–5.1)
Sodium: 134 mmol/L — ABNORMAL LOW (ref 135–145)

## 2016-04-09 LAB — TROPONIN I
Troponin I: 0.04 ng/mL (ref <0.03)
Troponin I: 0.06 ng/mL (ref <0.03)

## 2016-04-09 LAB — ETHANOL: Alcohol, Ethyl (B): 70 mg/dL — ABNORMAL HIGH (ref <5)

## 2016-04-09 MED ORDER — SULFAMETHOXAZOLE-TRIMETHOPRIM 800-160 MG PO TABS
2.0000 | ORAL_TABLET | Freq: Every day | ORAL | Status: DC
  Administered 2016-04-10: 2 via ORAL
  Filled 2016-04-09: qty 2

## 2016-04-09 MED ORDER — PNEUMOCOCCAL VAC POLYVALENT 25 MCG/0.5ML IJ INJ: 0.5000 mL | INJECTION | INTRAMUSCULAR | Status: DC | PRN

## 2016-04-09 MED ORDER — HEPARIN SODIUM (PORCINE) 5000 UNIT/ML IJ SOLN
5000.0000 [IU] | Freq: Three times a day (TID) | INTRAMUSCULAR | Status: DC
  Administered 2016-04-09 – 2016-04-12 (×9): 5000 [IU] via SUBCUTANEOUS
  Filled 2016-04-09 (×8): qty 1

## 2016-04-09 MED ORDER — KETOROLAC TROMETHAMINE 30 MG/ML IJ SOLN
30.0000 mg | Freq: Three times a day (TID) | INTRAMUSCULAR | Status: DC | PRN
  Administered 2016-04-09 – 2016-04-10 (×2): 30 mg via INTRAVENOUS
  Filled 2016-04-09 (×2): qty 1

## 2016-04-09 MED ORDER — PREDNISONE 20 MG PO TABS
60.0000 mg | ORAL_TABLET | Freq: Once | ORAL | Status: AC
  Administered 2016-04-09: 60 mg via ORAL
  Filled 2016-04-09: qty 3

## 2016-04-09 MED ORDER — CITALOPRAM HYDROBROMIDE 20 MG PO TABS
40.0000 mg | ORAL_TABLET | Freq: Every day | ORAL | Status: DC
  Administered 2016-04-10 – 2016-04-12 (×3): 40 mg via ORAL
  Filled 2016-04-09 (×3): qty 2

## 2016-04-09 MED ORDER — ABACAVIR-DOLUTEGRAVIR-LAMIVUD 600-50-300 MG PO TABS
1.0000 | ORAL_TABLET | Freq: Every day | ORAL | Status: DC
  Administered 2016-04-10 – 2016-04-12 (×3): 1 via ORAL
  Filled 2016-04-09 (×3): qty 1

## 2016-04-09 MED ORDER — ASPIRIN EC 325 MG PO TBEC
325.0000 mg | DELAYED_RELEASE_TABLET | Freq: Every day | ORAL | Status: DC
  Administered 2016-04-10 – 2016-04-12 (×3): 325 mg via ORAL
  Filled 2016-04-09 (×3): qty 1

## 2016-04-09 MED ORDER — ASPIRIN 81 MG PO CHEW
324.0000 mg | CHEWABLE_TABLET | Freq: Once | ORAL | Status: AC
  Administered 2016-04-09: 324 mg via ORAL
  Filled 2016-04-09: qty 4

## 2016-04-09 MED ORDER — RISPERIDONE 2 MG PO TABS
2.0000 mg | ORAL_TABLET | Freq: Every day | ORAL | Status: DC
  Administered 2016-04-09 – 2016-04-11 (×3): 2 mg via ORAL
  Filled 2016-04-09 (×4): qty 1

## 2016-04-09 MED ORDER — ACETAMINOPHEN 325 MG PO TABS: 650.0000 mg | ORAL_TABLET | ORAL | Status: DC | PRN

## 2016-04-09 MED ORDER — ONDANSETRON HCL 4 MG/2ML IJ SOLN: 4.0000 mg | Freq: Four times a day (QID) | INTRAMUSCULAR | Status: DC | PRN

## 2016-04-09 MED ORDER — FOLIC ACID 1 MG PO TABS
1.0000 mg | ORAL_TABLET | Freq: Every day | ORAL | Status: DC
  Administered 2016-04-11 – 2016-04-12 (×2): 1 mg via ORAL
  Filled 2016-04-09 (×2): qty 1

## 2016-04-09 MED ORDER — VITAMIN B-1 100 MG PO TABS
100.0000 mg | ORAL_TABLET | Freq: Every day | ORAL | Status: DC
  Administered 2016-04-11 – 2016-04-12 (×2): 100 mg via ORAL
  Filled 2016-04-09 (×3): qty 1

## 2016-04-09 MED ORDER — ALBUTEROL SULFATE HFA 108 (90 BASE) MCG/ACT IN AERS
2.0000 | INHALATION_SPRAY | Freq: Once | RESPIRATORY_TRACT | Status: AC
  Administered 2016-04-09: 2 via RESPIRATORY_TRACT
  Filled 2016-04-09: qty 6.7

## 2016-04-09 MED ORDER — SODIUM CHLORIDE 0.9 % IV SOLN
INTRAVENOUS | Status: DC
  Administered 2016-04-09 – 2016-04-10 (×2): via INTRAVENOUS

## 2016-04-09 MED ORDER — INFLUENZA VAC SPLIT QUAD 0.5 ML IM SUSY
0.5000 mL | PREFILLED_SYRINGE | INTRAMUSCULAR | Status: AC | PRN
  Administered 2016-04-12: 0.5 mL via INTRAMUSCULAR

## 2016-04-09 NOTE — ED Notes (Signed)
Labs drawn, pt to x-ray at this time

## 2016-04-09 NOTE — ED Notes (Signed)
Pt pulled off all EKG leads, blood pressure cuff and 02 sat,.  Pt up at bedside going through his belongs, eating vienna sausage.  Dr. Juleen ChinaKohut made aware.

## 2016-04-09 NOTE — ED Notes (Signed)
Troponin 0.04 reported to Yahoo! IncKim RN

## 2016-04-09 NOTE — ED Notes (Signed)
Troponin called from lab.  Dr. Juleen ChinaKohut made aware

## 2016-04-09 NOTE — ED Notes (Signed)
Attempted report 

## 2016-04-09 NOTE — ED Triage Notes (Addendum)
The patient has been having chest pain for two days, dirrhea, hot flashes, nausea, vomiting and blurred vision.  He is also compalining of headache.  He rates his pain 10/10.   EMS also said he drank two 40 ounces.
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
## 2016-04-09 NOTE — ED Notes (Signed)
Pt returned from xray
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
## 2016-04-10 ENCOUNTER — Encounter (HOSPITAL_COMMUNITY): Payer: Self-pay | Admitting: Physician Assistant

## 2016-04-10 DIAGNOSIS — B20 Human immunodeficiency virus [HIV] disease: Secondary | ICD-10-CM

## 2016-04-10 DIAGNOSIS — R748 Abnormal levels of other serum enzymes: Secondary | ICD-10-CM

## 2016-04-10 DIAGNOSIS — F250 Schizoaffective disorder, bipolar type: Secondary | ICD-10-CM

## 2016-04-10 DIAGNOSIS — R072 Precordial pain: Secondary | ICD-10-CM

## 2016-04-10 DIAGNOSIS — J449 Chronic obstructive pulmonary disease, unspecified: Secondary | ICD-10-CM | POA: Diagnosis not present

## 2016-04-10 DIAGNOSIS — F10920 Alcohol use, unspecified with intoxication, uncomplicated: Secondary | ICD-10-CM | POA: Diagnosis not present

## 2016-04-10 DIAGNOSIS — F1910 Other psychoactive substance abuse, uncomplicated: Secondary | ICD-10-CM

## 2016-04-10 DIAGNOSIS — R778 Other specified abnormalities of plasma proteins: Secondary | ICD-10-CM

## 2016-04-10 DIAGNOSIS — R0789 Other chest pain: Secondary | ICD-10-CM | POA: Diagnosis not present

## 2016-04-10 DIAGNOSIS — R7989 Other specified abnormal findings of blood chemistry: Secondary | ICD-10-CM

## 2016-04-10 DIAGNOSIS — F10921 Alcohol use, unspecified with intoxication delirium: Secondary | ICD-10-CM

## 2016-04-10 DIAGNOSIS — F331 Major depressive disorder, recurrent, moderate: Secondary | ICD-10-CM

## 2016-04-10 LAB — RAPID URINE DRUG SCREEN, HOSP PERFORMED
Amphetamines: NOT DETECTED
Barbiturates: NOT DETECTED
Benzodiazepines: NOT DETECTED
Cocaine: NOT DETECTED
Opiates: NOT DETECTED
Tetrahydrocannabinol: NOT DETECTED

## 2016-04-10 LAB — MRSA PCR SCREENING: MRSA by PCR: NEGATIVE

## 2016-04-10 LAB — TROPONIN I
Troponin I: 0.14 ng/mL (ref <0.03)
Troponin I: 0.05 ng/mL (ref <0.03)

## 2016-04-10 MED ORDER — LORAZEPAM 2 MG/ML IJ SOLN: 1.0000 mg | Freq: Four times a day (QID) | INTRAMUSCULAR | Status: DC | PRN

## 2016-04-10 MED ORDER — SULFAMETHOXAZOLE-TRIMETHOPRIM 800-160 MG PO TABS
1.0000 | ORAL_TABLET | Freq: Every day | ORAL | Status: DC
  Filled 2016-04-10 (×2): qty 1

## 2016-04-10 MED ORDER — LORAZEPAM 1 MG PO TABS: 1.0000 mg | ORAL_TABLET | Freq: Four times a day (QID) | ORAL | Status: DC | PRN

## 2016-04-10 MED ORDER — IPRATROPIUM-ALBUTEROL 0.5-2.5 (3) MG/3ML IN SOLN: 3.0000 mL | Freq: Four times a day (QID) | RESPIRATORY_TRACT | Status: DC | PRN

## 2016-04-10 NOTE — Progress Notes (Signed)
Text paged Troponin result since they jumped slightly from previous, VSS and no orders received at this time.
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
## 2016-04-10 NOTE — Progress Notes (Signed)
PROGRESS NOTE    [name]  ZDG:[id number] DOB: 05/16/1956 DOA: 04/09/2016 PCP: [facility]     Brief Narrative:  [name] is a 60 y.o. male with medical history significant of chronic hepatitis C, bipolar disorder, depression, AIDS, drug abuse, alcohol abuse, tobacco abuse who presents with chief complaint of chest pain. According to ED staff, on arrival patient complained of 10 out of 10 chest pain that had been going on for 3 weeks. On my exam however, he complained of 7 out of 10 sharp chest pain that is not reproducible and without radiation since day before yesterday. He states that has been progressively getting worse, which is why he sought care today. He admits to associated shortness of breath, nausea and vomiting. He was intoxicated on admission.   Assessment & Plan:   Principal Problem:   Chest pain Active Problems:   Alcohol abuse   Schizoaffective disorder (HCC)   Major depression, recurrent (HCC)   AIDS (HCC)   Alcohol intoxication (HCC)   Drug abuse   Elevated troponin   Atypical chest pain -Cardiology following  -Echo pending   Elevated troponin -Trended down, peak of 0.14  Alcohol intoxication -CIWA  -Thiamine, folate  Hx drug abuse, tobacco abuse  -UDS negative  AIDS -Continue Triumeq, bactrim   Depression/mood disorder -Continue celexa, risperdal    DVT prophylaxis: subq hep Code Status: full Family Communication: no family at bedside Disposition Plan: pending further work up, back home    Consultants:   Cardiology  Procedures:   None  Antimicrobials:   None    Subjective: States that he is still having chest pain 6 out of 10. No other complaints.  Objective: Vitals:   04/10/16 0051 04/10/16 0500 04/10/16 0552 04/10/16 1154  BP: 116/90 (!) 144/106 (!) 143/99 (!) 141/95  Pulse: 92 61 61 64  Resp: (!) 24 15  20   Temp: 97.7 F (36.5 C) 98.5 F (36.9 C)  97.8 F (36.6 C)  TempSrc:     Oral  SpO2: 92% 98%  98%  Weight:  95.3 kg (210 lb)    Height:        Intake/Output Summary (Last 24 hours) at 04/10/16 1215 Last data filed at 04/10/16 0944  Gross per 24 hour  Intake             1300 ml  Output              500 ml  Net              800 ml   Filed Weights   04/09/16 1456 04/09/16 2255 04/10/16 0500  Weight: 98.9 kg (218 lb) 95.2 kg (209 lb 14.4 oz) 95.3 kg (210 lb)    Examination:  General exam: Appears calm and comfortable  Respiratory system: Diminished breath sounds, no wheeze. Respiratory effort normal. Cardiovascular system: S1 & S2 heard, RRR. No JVD, murmurs, rubs, gallops or clicks. No pedal edema. Gastrointestinal system: Abdomen is nondistended, soft and nontender. No organomegaly or masses felt. Normal bowel sounds heard. Central nervous system: Alert and oriented. No focal neurological deficits. Extremities: Symmetric 5 x 5 power. Skin: No rashes, lesions or ulcers  Data Reviewed: I have personally reviewed following labs and imaging studies  CBC:  Recent Labs Lab 04/09/16 1543  WBC 8.5  NEUTROABS 5.5  HGB 14.1  HCT 41.1  MCV 95.6  PLT 149*   Basic Metabolic Panel:  Recent Labs Lab 04/09/16 1543  NA  134*  K 4.0  CL 103  CO2 23  GLUCOSE 102*  BUN 13  CREATININE 0.90  CALCIUM 9.3   GFR: Estimated Creatinine Clearance: 102 mL/min (by C-G formula based on SCr of 0.9 mg/dL). Liver Function Tests: No results for input(s): AST, ALT, ALKPHOS, BILITOT, PROT, ALBUMIN in the last 168 hours. No results for input(s): LIPASE, AMYLASE in the last 168 hours. No results for input(s): AMMONIA in the last 168 hours. Coagulation Profile: No results for input(s): INR, PROTIME in the last 168 hours. Cardiac Enzymes:  Recent Labs Lab 04/09/16 1543 04/09/16 2316 04/10/16 0055 04/10/16 0525  TROPONINI 0.04* 0.06* 0.14* 0.05*   BNP (last 3 results) No results for input(s): PROBNP in the last 8760 hours. HbA1C: No results for input(s):  HGBA1C in the last 72 hours. CBG: No results for input(s): GLUCAP in the last 168 hours. Lipid Profile: No results for input(s): CHOL, HDL, LDLCALC, TRIG, CHOLHDL, LDLDIRECT in the last 72 hours. Thyroid Function Tests: No results for input(s): TSH, T4TOTAL, FREET4, T3FREE, THYROIDAB in the last 72 hours. Anemia Panel: No results for input(s): VITAMINB12, FOLATE, FERRITIN, TIBC, IRON, RETICCTPCT in the last 72 hours. Sepsis Labs: No results for input(s): PROCALCITON, LATICACIDVEN in the last 168 hours.  Recent Results (from the past 240 hour(s))  MRSA PCR Screening     Status: None   Collection Time: 04/09/16 11:30 PM  Result Value Ref Range Status   MRSA by PCR NEGATIVE NEGATIVE Final    Comment:        The GeneXpert MRSA Assay (FDA approved for NASAL specimens only), is one component of a comprehensive MRSA colonization surveillance program. It is not intended to diagnose MRSA infection nor to guide or monitor treatment for MRSA infections.        Radiology Studies: Dg Chest 2 View  Result Date: 04/09/2016 CLINICAL DATA:  Chest pain with nausea and vomiting EXAM: CHEST  2 VIEW COMPARISON:  November 21, 2015 FINDINGS: There is no edema or consolidation. Heart is borderline enlarged with pulmonary vascularity within normal limits. No adenopathy. No bone lesions. No pneumothorax. IMPRESSION: Heart borderline enlarged.  No edema or consolidation. Electronically Signed   By: Bretta BangWilliam  Woodruff III M.D.   On: 04/09/2016 16:07      Scheduled Meds: . abacavir-dolutegravir-lamiVUDine  1 tablet Oral Daily  . aspirin EC  325 mg Oral Daily  . citalopram  40 mg Oral Daily  . folic acid  1 mg Oral Daily  . heparin  5,000 Units Subcutaneous Q8H  . risperiDONE  2 mg Oral QHS  . sulfamethoxazole-trimethoprim  2 tablet Oral Daily  . thiamine  100 mg Oral Daily   Continuous Infusions: . sodium chloride 100 mL/hr at 04/10/16 0943     LOS: 0 days    Time spent: 30 minutes    Noralee StainJennifer Arnola Crittendon, DO [facility] [email or web address] Password [email or web address] 04/10/2016, 12:15 PM

## 2016-04-10 NOTE — Plan of Care (Signed)
Problem: Safety: Goal: Ability to remain free from injury will improve Outcome: Progressing Instructed on how to use the call light and the rules on the unit, also instructed about the bed alarm and verbalized understanding. Patient was also shown how to call RN/NT using the white board and the call light.

## 2016-04-10 NOTE — Progress Notes (Signed)
Text paged newest Troponin level of 0.14 from 0.06, VS remain same with B/P slightly elevated, placed patient on 2l O2 via N/C for sats of 88% on RA when patient fell asleep. Patient has been resting quietly and comfortably since medicated with IV Toradol close to midnight, will cont. To monitor.

## 2016-04-11 ENCOUNTER — Observation Stay (HOSPITAL_BASED_OUTPATIENT_CLINIC_OR_DEPARTMENT_OTHER): Payer: Medicare Other

## 2016-04-11 ENCOUNTER — Ambulatory Visit: Payer: Self-pay | Admitting: Infectious Disease

## 2016-04-11 DIAGNOSIS — R079 Chest pain, unspecified: Secondary | ICD-10-CM | POA: Diagnosis not present

## 2016-04-11 DIAGNOSIS — R0789 Other chest pain: Secondary | ICD-10-CM | POA: Diagnosis not present

## 2016-04-11 DIAGNOSIS — R072 Precordial pain: Secondary | ICD-10-CM | POA: Diagnosis not present

## 2016-04-11 LAB — ECHOCARDIOGRAM COMPLETE
Height: 72 in
Weight: 3436.8 oz

## 2016-04-11 NOTE — Plan of Care (Signed)
Problem: Safety: Goal: Ability to remain free from injury will improve Outcome: Progressing Patient uses call light appropriately and doesn't get OOB without calling, will keep Bed Alarm on at night and continue to monitor.

## 2016-04-11 NOTE — Plan of Care (Signed)
Problem: Pain Managment: Goal: General experience of comfort will improve Outcome: Progressing Patient resting pretty comfortably tonight with no s/s of distress, B/P elevated and MD is aware, will continue to monitor.

## 2016-04-11 NOTE — Progress Notes (Signed)
Patient Name: [name] Date of Encounter: 04/11/2016  Primary Cardiologist:  Dr. Rennis GoldenHilty (new)  Hospital Problem List     Principal Problem:   Chest pain Active Problems:   Alcohol abuse   Schizoaffective disorder (HCC)   Major depression, recurrent (HCC)   AIDS (HCC)   Alcohol intoxication (HCC)   Drug abuse   Elevated troponin     Subjective   He is awake and alert and not currently complaining of chest pain.  No SOB.   Inpatient Medications    Scheduled Meds: . abacavir-dolutegravir-lamiVUDine  1 tablet Oral Daily  . aspirin EC  325 mg Oral Daily  . citalopram  40 mg Oral Daily  . folic acid  1 mg Oral Daily  . heparin  5,000 Units Subcutaneous Q8H  . risperiDONE  2 mg Oral QHS  . sulfamethoxazole-trimethoprim  1 tablet Oral Daily  . thiamine  100 mg Oral Daily   Continuous Infusions:  PRN Meds: acetaminophen, Influenza vac split quadrivalent PF, ipratropium-albuterol, ketorolac, LORazepam **OR** LORazepam, ondansetron (ZOFRAN) IV, pneumococcal 23 valent vaccine   Vital Signs    Vitals:   04/11/16 0250 04/11/16 0500 04/11/16 0800 04/11/16 0812  BP:  124/86 122/89   Pulse:  (!) 56    Resp:  15    Temp:  98.3 F (36.8 C)  98 F (36.7 C)  TempSrc:    Oral  SpO2:  98% 97% 96%  Weight: 214 lb 12.8 oz (97.4 kg)     Height:        Intake/Output Summary (Last 24 hours) at 04/11/16 1006 Last data filed at 04/11/16 0814  Gross per 24 hour  Intake              482 ml  Output             1350 ml  Net             -868 ml   Filed Weights   04/09/16 2255 04/10/16 0500 04/11/16 0250  Weight: 209 lb 14.4 oz (95.2 kg) 210 lb (95.3 kg) 214 lb 12.8 oz (97.4 kg)    Physical Exam    GEN: NAD.  Neck:  no JVD Cardiac: Regular Rate and Rhythm, no murmurs, rubs, or gallops.  noedema.  Radials/DP/PT 2+  and equal bilaterally.  Respiratory:  Respirations  regular and unlabored, clear to auscultation bilaterally. GI: Soft, nontender, nondistended, BS + x  4. Skin: warm and dry, no rash. Neuro:   Strength and sensation are intact. Psych:  AAOx3.  Normal affect.  Labs    CBC  Recent Labs  04/09/16 1543  WBC 8.5  NEUTROABS 5.5  HGB 14.1  HCT 41.1  MCV 95.6  PLT 149*   Basic Metabolic Panel  Recent Labs  04/09/16 1543  NA 134*  K 4.0  CL 103  CO2 23  GLUCOSE 102*  BUN 13  CREATININE 0.90  CALCIUM 9.3   Liver Function Tests No results for input(s): AST, ALT, ALKPHOS, BILITOT, PROT, ALBUMIN in the last 72 hours. No results for input(s): LIPASE, AMYLASE in the last 72 hours. Cardiac Enzymes  Recent Labs  04/09/16 2316 04/10/16 0055 04/10/16 0525  TROPONINI 0.06* 0.14* 0.05*   BNP Invalid input(s): POCBNP D-Dimer No results for input(s): DDIMER in the last 72 hours. Hemoglobin A1C No results for input(s): HGBA1C in the last 72 hours. Fasting Lipid Panel No results for input(s): CHOL, HDL, LDLCALC, TRIG, CHOLHDL, LDLDIRECT in the last 72 hours.  Thyroid Function Tests No results for input(s): TSH, T4TOTAL, T3FREE, THYROIDAB in the last 72 hours.  Invalid input(s): FREET3  Telemetry    NSR- Personally Reviewed  ECG    NA - Personally Reviewed  Radiology    Dg Chest 2 View  Result Date: 04/09/2016 CLINICAL DATA:  Chest pain with nausea and vomiting EXAM: CHEST  2 VIEW COMPARISON:  November 21, 2015 FINDINGS: There is no edema or consolidation. Heart is borderline enlarged with pulmonary vascularity within normal limits. No adenopathy. No bone lesions. No pneumothorax. IMPRESSION: Heart borderline enlarged.  No edema or consolidation. Electronically Signed   By: Bretta BangWilliam  Woodruff III M.D.   On: 04/09/2016 16:07    Cardiac Studies   ECHO PENDING  Patient Profile     [name] is a 60 y.o. male with history of HIV/AIDS, ?COPD, hepatitis C, bipolar disorder, depression, prior MRSA bacteremia, polysubstance abuse (alcohol, tobacco, prior cocaine, THC), abnormal [facility]FTs [facility]  intoxication and talked about chest pain.  Troponin has been borderline elevated and flat.  Non specific EKG.   Assessment & Plan    CHEST PAIN:  Echo pending.  I will plan a [employer] Myov[employer] tomorrow.     Signed, Rollene RotundaJames Khaleel Beckom, MD  04/11/2016, 10:06 AM

## 2016-04-11 NOTE — Progress Notes (Signed)
[facility] report [facility], reviewed new orders, POC and events of the day, assumed care of the patient.

## 2016-04-11 NOTE — Progress Notes (Signed)
2D echocardiogram has been performed. 

## 2016-04-11 NOTE — Clinical Social Work Note (Signed)
Clinical Social Work Assessment  Patient Details  Name: [name] MRN: [id number] Date of Birth: 04/13/1956  Date of referral:  04/11/16               Reason for consult:  Substance Use/ETOH Abuse, Transportation                Permission sought to share information with:    Permission granted to share information::  No  Name::        Agency::     Relationship::     Contact Information:     Housing/Transportation Living arrangements for the past 2 months:  Single Family Home Source of Information:  Patient Patient Interpreter Needed:  None Criminal Activity/Legal Involvement Pertinent to Current Situation/Hospitalization:  No - Comment as needed Significant Relationships:  Friend, Significant Other Lives with:  Friends Do you feel safe going back to the place where you live?  Yes Need for family participation in patient care:  No (Coment)  Care giving concerns:  The patient does not report any care giving concerns. He states that all of his needs are met, though he does want to get back into a group home.    Social Worker assessment / plan:  CSW met with patient at bedside to complete assessment. The patient states he has been living with a friend named "[name]" ([id number]) and his girlfriend [name]" ([phone]). The patient plans to return home at time of discharge. The patient has been in contact with a group home call [facility] ran by [name], but the patient has not committed to group home placement. CSW inquired about the patient's alcohol use. The patient states that he drinks approximately 3 40's per day when he is able to afford it. He drinks mostly at the beginning of the month because this is when he gets his SSI ($25) and SSDI ($650). The patient shares that he has been through treatment many times including stays at [facility], [facility], and the [place]. The patient is only interested in outpatient treatment options at this time. CSW provided the  patient with available options including an AA meeting schedule for [facility]. The has also requested transportation resources. Transportation resources were provided along with 4 bus passes to assist with getting to follow up appointments at time of discharge.   Employment status:  Disabled (Comment on whether or not currently receiving Disability) Insurance information:  Teacher, English as a foreign language, Medicaid In [place] PT Recommendations:  Not assessed at this time Information / Referral to community resources:  SBIRT, Residential Substance Abuse Treatment Options, Outpatient Substance Abuse Treatment Options, Other (Comment Required) (Transportation resources provided including 4 bus passes to assist patient with getting to f/u appointments.)  Patient/Family's Response to care:  The patient is pleasant and thanks CSW for assistance and concern. The patient appears happy with the care he has received.  Patient/Family's Understanding of and Emotional Response to Diagnosis, Current Treatment, and Prognosis:  The patient appears to have a fair understanding of the reason for his admission. He states he is happy his chest pain has lessened. He understands that he is scheduled for a stress test. The patient understands that his alcohol use is negatively impacting his health and seems motivated to make some changes.   Emotional Assessment Appearance:  Appears stated age Attitude/Demeanor/Rapport:  Other (Patient is appropriate and welcoming of CSW. Patient was engaged in assessment.) Affect (typically observed):  Accepting, Appropriate, Calm, Pleasant Orientation:  Oriented to Self, Oriented  to Place, Oriented to  Time, Oriented to Situation Alcohol / Substance use:  Alcohol Use (3 40oz per day. = 10 alcoholic beverages) Psych involvement (Current and /or in the community):  No (Comment)  Discharge Needs  Concerns to be addressed:  Substance Abuse Concerns Readmission within the last 30 days:    Current  discharge risk:  Substance Abuse Barriers to Discharge:  Continued Medical Work up   Johnathan Noel, LCSW 04/11/2016, 11:20 PM

## 2016-04-11 NOTE — Progress Notes (Signed)
PROGRESS NOTE    [name]  [id number] DOB: 07/13/1955 DOA: 04/09/2016 PCP: [facility]     Brief Narrative:  [name] is a 60 y.o. male with medical history significant of chronic hepatitis C, bipolar disorder, depression, AIDS, drug abuse, alcohol abuse, tobacco abuse who presents with chief complaint of chest pain. According to ED staff, on arrival patient complained of 10 out of 10 chest pain that had been going on for 3 weeks. On my exam however, he complained of 7 out of 10 sharp chest pain that is not reproducible and without radiation since day before yesterday. He states that has been progressively getting worse, which is why he sought care today. He admits to associated shortness of breath, nausea and vomiting. He was intoxicated on admission.   Assessment & Plan:   Principal Problem:   Chest pain Active Problems:   Alcohol abuse   Schizoaffective disorder (HCC)   Major depression, recurrent (HCC)   AIDS (HCC)   Alcohol intoxication (HCC)   Drug abuse   Elevated troponin   Atypical chest pain -Cardiology following  -Echo pending  -Planning for Lexiscan tomorrow   Elevated troponin -Trended down, peak of 0.14  Alcohol intoxication -CIWA  -Thiamine, folate  Hx drug abuse, tobacco abuse  -UDS negative  AIDS -Continue Triumeq, bactrim   Depression/mood disorder -Continue celexa, risperdal    DVT prophylaxis: subq hep Code Status: full Family Communication: no family at bedside Disposition Plan: pending further work up, back home    Consultants:   Cardiology  Procedures:   None  Antimicrobials:   None    Subjective: States that he is still having chest pain 4 out of 10 which is improved from yesterday, also having SOB. No other complaints.  Objective: Vitals:   04/11/16 0500 04/11/16 0800 04/11/16 0812 04/11/16 1100  BP: 124/86 122/89    Pulse: (!) 56     Resp: 15     Temp: 98.3 F (36.8 C)  98  F (36.7 C) 98.3 F (36.8 C)  TempSrc:   Oral Oral  SpO2: 98% 97% 96%   Weight:      Height:        Intake/Output Summary (Last 24 hours) at 04/11/16 1106 Last data filed at 04/11/16 0814  Gross per 24 hour  Intake              482 ml  Output             1350 ml  Net             -868 ml   Filed Weights   04/09/16 2255 04/10/16 0500 04/11/16 0250  Weight: 95.2 kg (209 lb 14.4 oz) 95.3 kg (210 lb) 97.4 kg (214 lb 12.8 oz)    Examination:  General exam: Appears calm and comfortable  Respiratory system: Diminished breath sounds, no wheeze. Respiratory effort normal. Cardiovascular system: S1 & S2 heard, RRR. No JVD, murmurs, rubs, gallops or clicks. No pedal edema. Gastrointestinal system: Abdomen is nondistended, soft and nontender. No organomegaly or masses felt. Normal bowel sounds heard. Central nervous system: Alert and oriented. No focal neurological deficits. Extremities: Symmetric 5 x 5 power. Skin: No rashes, lesions or ulcers  Data Reviewed: I have personally reviewed following labs and imaging studies  CBC:  Recent Labs Lab 04/09/16 1543  WBC 8.5  NEUTROABS 5.5  HGB 14.1  HCT 41.1  MCV 95.6  PLT 149*   Basic Metabolic Panel:  Recent Labs Lab 04/09/16 1543  NA 134*  K 4.0  CL 103  CO2 23  GLUCOSE 102*  BUN 13  CREATININE 0.90  CALCIUM 9.3   GFR: Estimated Creatinine Clearance: 102.9 mL/min (by C-G formula based on SCr of 0.9 mg/dL). Liver Function Tests: No results for input(s): AST, ALT, ALKPHOS, BILITOT, PROT, ALBUMIN in the last 168 hours. No results for input(s): LIPASE, AMYLASE in the last 168 hours. No results for input(s): AMMONIA in the last 168 hours. Coagulation Profile: No results for input(s): INR, PROTIME in the last 168 hours. Cardiac Enzymes:  Recent Labs Lab 04/09/16 1543 04/09/16 2316 04/10/16 0055 04/10/16 0525  TROPONINI 0.04* 0.06* 0.14* 0.05*   BNP (last 3 results) No results for input(s): PROBNP in the last 8760  hours. HbA1C: No results for input(s): HGBA1C in the last 72 hours. CBG: No results for input(s): GLUCAP in the last 168 hours. Lipid Profile: No results for input(s): CHOL, HDL, LDLCALC, TRIG, CHOLHDL, LDLDIRECT in the last 72 hours. Thyroid Function Tests: No results for input(s): TSH, T4TOTAL, FREET4, T3FREE, THYROIDAB in the last 72 hours. Anemia Panel: No results for input(s): VITAMINB12, FOLATE, FERRITIN, TIBC, IRON, RETICCTPCT in the last 72 hours. Sepsis Labs: No results for input(s): PROCALCITON, LATICACIDVEN in the last 168 hours.  Recent Results (from the past 240 hour(s))  MRSA PCR Screening     Status: None   Collection Time: 04/09/16 11:30 PM  Result Value Ref Range Status   MRSA by PCR NEGATIVE NEGATIVE Final    Comment:        The GeneXpert MRSA Assay (FDA approved for NASAL specimens only), is one component of a comprehensive MRSA colonization surveillance program. It is not intended to diagnose MRSA infection nor to guide or monitor treatment for MRSA infections.        Radiology Studies: Dg Chest 2 View  Result Date: 04/09/2016 CLINICAL DATA:  Chest pain with nausea and vomiting EXAM: CHEST  2 VIEW COMPARISON:  November 21, 2015 FINDINGS: There is no edema or consolidation. Heart is borderline enlarged with pulmonary vascularity within normal limits. No adenopathy. No bone lesions. No pneumothorax. IMPRESSION: Heart borderline enlarged.  No edema or consolidation. Electronically Signed   By: Bretta BangWilliam  Woodruff III M.D.   On: 04/09/2016 16:07      Scheduled Meds: . abacavir-dolutegravir-lamiVUDine  1 tablet Oral Daily  . aspirin EC  325 mg Oral Daily  . citalopram  40 mg Oral Daily  . folic acid  1 mg Oral Daily  . heparin  5,000 Units Subcutaneous Q8H  . risperiDONE  2 mg Oral QHS  . sulfamethoxazole-trimethoprim  1 tablet Oral Daily  . thiamine  100 mg Oral Daily   Continuous Infusions:    LOS: 0 days    Time spent: 30 minutes   Noralee StainJennifer  Chanson Teems, DO [facility] [email or web address] Password [email or web address] 04/11/2016, 11:06 AM

## 2016-04-11 NOTE — Care Management Obs Status (Signed)
MEDICARE OBSERVATION STATUS NOTIFICATION   Patient Details  Name: [name] MRN: [id number] Date of Birth: 05/13/1956   Medicare Observation Status Notification Given:  Yes    Gala LewandowskyGraves-Bigelow, Antavius Sperbeck Kaye, RN 04/11/2016, 3:46 PM

## 2016-04-12 ENCOUNTER — Observation Stay (HOSPITAL_BASED_OUTPATIENT_CLINIC_OR_DEPARTMENT_OTHER): Payer: Medicare Other

## 2016-04-12 DIAGNOSIS — R072 Precordial pain: Secondary | ICD-10-CM | POA: Diagnosis not present

## 2016-04-12 DIAGNOSIS — R079 Chest pain, unspecified: Secondary | ICD-10-CM | POA: Diagnosis not present

## 2016-04-12 DIAGNOSIS — I209 Angina pectoris, unspecified: Secondary | ICD-10-CM

## 2016-04-12 DIAGNOSIS — R0789 Other chest pain: Secondary | ICD-10-CM | POA: Diagnosis not present

## 2016-04-12 LAB — NM MYOCAR MULTI W/SPECT W/WALL MOTION / EF
Estimated workload: 1 METS
Exercise duration (min): 5 min
Exercise duration (sec): 17 s
Peak HR: 100 {beats}/min
Rest HR: 81 {beats}/min

## 2016-04-12 MED ORDER — TECHNETIUM TC 99M TETROFOSMIN IV KIT
30.0000 | PACK | Freq: Once | INTRAVENOUS | Status: AC | PRN
  Administered 2016-04-12: 30 via INTRAVENOUS

## 2016-04-12 MED ORDER — REGADENOSON 0.4 MG/5ML IV SOLN
INTRAVENOUS | Status: AC
  Filled 2016-04-12: qty 5

## 2016-04-12 MED ORDER — TECHNETIUM TC 99M TETROFOSMIN IV KIT
10.0000 | PACK | Freq: Once | INTRAVENOUS | Status: AC | PRN
  Administered 2016-04-12: 10 via INTRAVENOUS

## 2016-04-12 MED ORDER — AMINOPHYLLINE 25 MG/ML IV SOLN
INTRAVENOUS | Status: AC
  Filled 2016-04-12: qty 10

## 2016-04-12 MED ORDER — REGADENOSON 0.4 MG/5ML IV SOLN
0.4000 mg | Freq: Once | INTRAVENOUS | Status: AC
  Administered 2016-04-12: 0.4 mg via INTRAVENOUS
  Filled 2016-04-12: qty 5

## 2016-04-12 NOTE — Discharge Instructions (Signed)
Chest Wall Pain °Introduction °Chest wall pain is pain in or around the bones and muscles of your chest. Sometimes, an injury causes this pain. Sometimes, the cause may not be known. This pain may take several weeks or longer to get better. °Follow these instructions at home: °Pay attention to any changes in your symptoms. Take these actions to help with your pain: °· Rest as told by your doctor. °· Avoid activities that cause pain. Try not to use your chest, belly (abdominal), or side muscles to lift heavy things. °· If directed, apply ice to the painful area: °¨ Put ice in a plastic bag. °¨ Place a towel between your skin and the bag. °¨ Leave the ice on for 20 minutes, 2-3 times per day. °· Take over-the-counter and prescription medicines only as told by your doctor. °· Do not use tobacco products, including cigarettes, chewing tobacco, and e-cigarettes. If you need help quitting, ask your doctor. °· Keep all follow-up visits as told by your doctor. This is important. °Contact a doctor if: °· You have a fever. °· Your chest pain gets worse. °· You have new symptoms. °Get help right away if: °· You feel sick to your stomach (nauseous) or you throw up (vomit). °· You feel sweaty or light-headed. °· You have a cough with phlegm (sputum) or you cough up blood. °· You are short of breath. °This information is not intended to replace advice given to you by your health care provider. Make sure you discuss any questions you have with your health care provider. °Document Released: 10/26/2007 Document Revised: 10/15/2015 Document Reviewed: 08/04/2014 °© 2017 Elsevier ° °

## 2016-04-12 NOTE — Discharge Summary (Signed)
Physician Discharge Summary  [name] ZOX:[id number] DOB: July 26, 1955 DOA: 04/09/2016  PCP: [facility]  Admit date: 04/09/2016 Discharge date: 04/12/2016  Admitted From: Home Disposition:  Home  Recommendations for Outpatient Follow-up:  1. Follow up with PCP in 1-2 weeks  Home Health: No  Equipment/Devices: None   Discharge Condition: Stable CODE STATUS: Full  Diet recommendation: Heart healthy   Brief/Interim Summary: From H&P: [name] is a 60 y.o. male with medical history significant of chronic hepatitis C, bipolar disorder, depression, AIDS, drug abuse, alcohol abuse, tobacco abuse who presents with chief complaint of chest pain. According to ED staff, on arrival patient complained of 10 out of 10 chest pain that had been going on for 3 weeks. On my exam however, he complained of 7 out of 10 sharp chest pain that is not reproducible and without radiation since day before yesterday. He states that has been progressively getting worse, which is why he sought care today. He admits to associated shortness of breath, nausea and vomiting. He is currently intoxicated and unable to give further accurate history. States that he drank 2 x 40oz beer prior to coming to the ED and does so on a regular basis.   Interim: Patient was evaluated by cardiology for his chest pain. He underwent echocardiogram as well as stress test. These were unremarkable. His chest pain resolved.   Subjective on day of discharge: Feeling well. States that he is no longer having any chest pain or shortness of breath. No other complaints today. Tolerating diet and ambulating well.  Discharge Diagnoses:  Principal Problem:   Chest pain Active Problems:   Alcohol abuse   Schizoaffective disorder (HCC)   Major depression, recurrent (HCC)   AIDS (HCC)   Alcohol intoxication (HCC)   Drug abuse   Elevated troponin  Atypical chest pain -Cardiology following  -Echo and Stress test  results as below. No further cardiac work up.   Elevated troponin -Trended down, peak of 0.14  Alcohol intoxication -Thiamine, folate -Encouraged cessation   Hx drug abuse, tobacco abuse  -UDS negative  AIDS -Continue Triumeq, bactrim   Depression/mood disorder -Continue celexa, risperdal   Discharge Instructions  Discharge Instructions    Diet - low sodium heart healthy    Complete by:  As directed    Increase activity slowly    Complete by:  As directed        Medication List    TAKE these medications   abacavir-dolutegravir-lamiVUDine 600-50-300 MG tablet Commonly known as:  TRIUMEQ Take 1 tablet by mouth daily.   albuterol 108 (90 Base) MCG/ACT inhaler Commonly known as:  PROVENTIL HFA;VENTOLIN HFA Inhale 2 puffs into the lungs every 6 (six) hours as needed for wheezing or shortness of breath.   citalopram 40 MG tablet Commonly known as:  CELEXA Take 1 tablet (40 mg total) by mouth daily.   gabapentin 300 MG capsule Commonly known as:  NEURONTIN TAKE ONE CAPSULE BY MOUTH EVERY DAY IN THE MORNING AND 2 CAPSULES AT NIGHT   hydrOXYzine 50 MG tablet Commonly known as:  ATARAX/VISTARIL Take 50 mg by mouth 2 (two) times daily as needed for itching.   risperiDONE 2 MG tablet Commonly known as:  RISPERDAL Take 1 tablet (2 mg total) by mouth at bedtime. What changed:  Another medication with the same name was removed. Continue taking this medication, and follow the directions you see here.   sulfamethoxazole-trimethoprim 800-160 MG tablet Commonly known as:  BACTRIM  DS Take 2 tablets by mouth daily.       Allergies  Allergen Reactions  . Vancomycin Other (See Comments)    Pt had renal failure while on vancomycin for MRSA bacteremia, [facility]  . Didanosine Other (See Comments)    REACTION: Unknown reaction  . Haloperidol Lactate Other (See Comments)    REACTION: Unknown reaction  . Tenofovir Disoproxil Palpitations    Pt was  admitted with renal failure and TNF stopped but not clearly proven to have been culprit  . Zidovudine Other (See Comments)    REACTION: Unknown reaction    Consultations:  Cardiology   Procedures/Studies: Dg Chest 2 View  Result Date: 04/09/2016 CLINICAL DATA:  Chest pain with nausea and vomiting EXAM: CHEST  2 VIEW COMPARISON:  November 21, 2015 FINDINGS: There is no edema or consolidation. Heart is borderline enlarged with pulmonary vascularity within normal limits. No adenopathy. No bone lesions. No pneumothorax. IMPRESSION: Heart borderline enlarged.  No edema or consolidation. Electronically Signed   By: Bretta BangWilliam  Woodruff III M.D.   On: 04/09/2016 16:07   Nm Myocar Multi W/spect W/wall Motion / Ef  Result Date: 04/12/2016 CLINICAL DATA:  Chest pain EXAM: MYOCARDIAL IMAGING WITH SPECT (REST AND PHARMACOLOGIC-STRESS) GATED LEFT VENTRICULAR WALL MOTION STUDY LEFT VENTRICULAR EJECTION FRACTION TECHNIQUE: Standard myocardial SPECT imaging was performed after resting intravenous injection of 10 mCi Tc-6435m tetrofosmin. Subsequently, intravenous infusion of Lexiscan was performed under the supervision of the Cardiology staff. At peak effect of the drug, 30 mCi Tc-6335m tetrofosmin was injected intravenously and standard myocardial SPECT imaging was performed. Quantitative gated imaging was also performed to evaluate left ventricular wall motion, and estimate left ventricular ejection fraction. COMPARISON:  None. FINDINGS: Perfusion: No decreased activity in the left ventricle on stress imaging to suggest reversible ischemia or infarction. Wall Motion: Normal left ventricular wall motion. No left ventricular dilation. Left Ventricular Ejection Fraction: 60 % End diastolic volume 116 ml End systolic volume 46 ml IMPRESSION: 1. No reversible ischemia or infarction. 2. Normal left ventricular wall motion. 3. Left ventricular ejection fraction 60% 4. Non invasive risk stratification*: Low risk. *2012 Appropriate  Use Criteria for Coronary Revascularization Focused Update: J Am Coll Cardiol. 2012;59(9):857-881. http://content.dementiazones.comonlinejacc.org/article.aspx?articleid=1201161 Electronically Signed   By: Jolaine ClickArthur  Hoss M.D.   On: 04/12/2016 13:43    Echo Left ventricle: The cavity size was normal. Wall thickness was increased in a pattern of mild LVH. Systolic function was normal. The estimated ejection fraction was in the range of 55% to 60%. Wall motion was normal; there were no regional wall motion abnormalities. Left ventricular diastolic function parameters  were normal.   Discharge Exam: Vitals:   04/12/16 1124 04/12/16 1125  BP: 137/76 119/71  Pulse:    Resp:    Temp:     Vitals:   04/12/16 1120 04/12/16 1122 04/12/16 1124 04/12/16 1125  BP: 126/77 (!) 133/94 137/76 119/71  Pulse:      Resp:      Temp:      TempSrc:      SpO2:      Weight:      Height:        General: Pt is alert, awake, not in acute distress Cardiovascular: RRR, S1/S2 +, no rubs, no gallops Respiratory: CTA bilaterally, no wheezing, no rhonchi Abdominal: Soft, NT, ND, bowel sounds + Extremities: no edema, no cyanosis    The results of significant diagnostics from this hospitalization (including imaging, microbiology, ancillary and laboratory) are listed below  for reference.     Microbiology: Recent Results (from the past 240 hour(s))  MRSA PCR Screening     Status: None   Collection Time: 04/09/16 11:30 PM  Result Value Ref Range Status   MRSA by PCR NEGATIVE NEGATIVE Final    Comment:        The GeneXpert MRSA Assay (FDA approved for NASAL specimens only), is one component of a comprehensive MRSA colonization surveillance program. It is not intended to diagnose MRSA infection nor to guide or monitor treatment for MRSA infections.      Labs: BNP (last 3 results) No results for input(s): BNP in the last 8760 hours. Basic Metabolic Panel:  Recent Labs Lab 04/09/16 1543  NA 134*  K 4.0  CL 103   CO2 23  GLUCOSE 102*  BUN 13  CREATININE 0.90  CALCIUM 9.3   Liver Function Tests: No results for input(s): AST, ALT, ALKPHOS, BILITOT, PROT, ALBUMIN in the last 168 hours. No results for input(s): LIPASE, AMYLASE in the last 168 hours. No results for input(s): AMMONIA in the last 168 hours. CBC:  Recent Labs Lab 04/09/16 1543  WBC 8.5  NEUTROABS 5.5  HGB 14.1  HCT 41.1  MCV 95.6  PLT 149*   Cardiac Enzymes:  Recent Labs Lab 04/09/16 1543 04/09/16 2316 04/10/16 0055 04/10/16 0525  TROPONINI 0.04* 0.06* 0.14* 0.05*   BNP: Invalid input(s): POCBNP CBG: No results for input(s): GLUCAP in the last 168 hours. D-Dimer No results for input(s): DDIMER in the last 72 hours. Hgb A1c No results for input(s): HGBA1C in the last 72 hours. Lipid Profile No results for input(s): CHOL, HDL, LDLCALC, TRIG, CHOLHDL, LDLDIRECT in the last 72 hours. Thyroid function studies No results for input(s): TSH, T4TOTAL, T3FREE, THYROIDAB in the last 72 hours.  Invalid input(s): FREET3 Anemia work up No results for input(s): VITAMINB12, FOLATE, FERRITIN, TIBC, IRON, RETICCTPCT in the last 72 hours. Urinalysis    Component Value Date/Time   COLORURINE YELLOW 07/03/2015 1242   APPEARANCEUR CLEAR 07/03/2015 1242   APPEARANCEUR Clear 02/28/2014 1255   LABSPEC 1.007 07/03/2015 1242   LABSPEC 1.011 02/28/2014 1255   PHURINE 5.0 07/03/2015 1242   GLUCOSEU NEGATIVE 07/03/2015 1242   GLUCOSEU Negative 02/28/2014 1255   GLUCOSEU NEG mg/dL 16/10/960401/22/2008 [id number]   HGBUR NEGATIVE 07/03/2015 1242   BILIRUBINUR NEGATIVE 07/03/2015 1242   BILIRUBINUR Negative 02/28/2014 1255   KETONESUR NEGATIVE 07/03/2015 1242   PROTEINUR NEGATIVE 07/03/2015 1242   UROBILINOGEN 1 09/06/2011 1509   NITRITE NEGATIVE 07/03/2015 1242   LEUKOCYTESUR NEGATIVE 07/03/2015 1242   LEUKOCYTESUR Negative 02/28/2014 1255   Sepsis Labs Invalid input(s): PROCALCITONIN,  WBC,  LACTICIDVEN Microbiology Recent Results (from  the past 240 hour(s))  MRSA PCR Screening     Status: None   Collection Time: 04/09/16 11:30 PM  Result Value Ref Range Status   MRSA by PCR NEGATIVE NEGATIVE Final    Comment:        The GeneXpert MRSA Assay (FDA approved for NASAL specimens only), is one component of a comprehensive MRSA colonization surveillance program. It is not intended to diagnose MRSA infection nor to guide or monitor treatment for MRSA infections.      Time coordinating discharge: Over 30 minutes  SIGNED:  Noralee StainJennifer Solomiya Pascale, DO [facility] Pager [phone]  If 7PM-7AM, please contact night-coverage [email or web address] Password [email or web address] 04/12/2016, 3:16 PM

## 2016-04-12 NOTE — Progress Notes (Signed)
Myoview showed:  1. No reversible ischemia or infarction.  2. Normal left ventricular wall motion.  3. Left ventricular ejection fraction 60%  4. Non invasive risk stratification*: Low risk.  Echo reassuring. No further cardiac work up. Will sign off. F/u with PCP. Cardiology follow up PRN.

## 2016-04-12 NOTE — Progress Notes (Signed)
Patient presented for Lexiscan. Tolerated procedure well. Pending final stress imaging result.  

## 2016-04-12 NOTE — Progress Notes (Signed)
Patient Name: Johnathan LeysRupert D Furber Date of Encounter: 04/12/2016  Primary Cardiologist: Dr. Rennis GoldenHilty [facility]  Hospital Problem List     Principal Problem:   Chest pain Active Problems:   Alcohol abuse   Schizoaffective disorder (HCC)   Major depression, recurrent (HCC)   AIDS (HCC)   Alcohol intoxication (HCC)   Drug abuse   Elevated troponin     Subjective   No chest pain. Mild shortness of breath.   Inpatient Medications    Scheduled Meds: . abacavir-dolutegravir-lamiVUDine  1 tablet Oral Daily  . aspirin EC  325 mg Oral Daily  . citalopram  40 mg Oral Daily  . folic acid  1 mg Oral Daily  . heparin  5,000 Units Subcutaneous Q8H  . risperiDONE  2 mg Oral QHS  . sulfamethoxazole-trimethoprim  1 tablet Oral Daily  . thiamine  100 mg Oral Daily   Continuous Infusions:  PRN Meds: acetaminophen, Influenza vac split quadrivalent PF, ipratropium-albuterol, ketorolac, LORazepam **OR** LORazepam, ondansetron (ZOFRAN) IV, pneumococcal 23 valent vaccine   Vital Signs    Vitals:   04/11/16 1635 04/11/16 2043 04/12/16 0019 04/12/16 0500  BP: (!) 160/105 (!) 140/100 (!) 138/97 (!) 137/103  Pulse:  67 77 (!) 58  Resp:  19 20 20   Temp: 98.7 F (37.1 C) 98.3 F (36.8 C) 97.6 F (36.4 C) 98.3 F (36.8 C)  TempSrc: Oral Oral Oral Oral  SpO2: 100% 100% 99% 100%  Weight:    215 lb (97.5 kg)  Height:        Intake/Output Summary (Last 24 hours) at 04/12/16 1112 Last data filed at 04/12/16 0907  Gross per 24 hour  Intake              666 ml  Output             2525 ml  Net            -1859 ml   Filed Weights   04/10/16 0500 04/11/16 0250 04/12/16 0500  Weight: 210 lb (95.3 kg) 214 lb 12.8 oz (97.4 kg) 215 lb (97.5 kg)    Physical Exam   GEN: Well nourished, well developed, in no acute distress.  HEENT: Grossly normal.  Neck: Supple, no JVD, carotid bruits, or masses. Cardiac: RRR, no murmurs, rubs, or gallops. No clubbing, cyanosis, edema.  Radials/DP/PT 2+ and equal  bilaterally.  Respiratory:  Respirations regular and unlabored, clear to auscultation bilaterally. GI: Soft, nontender, nondistended, BS + x 4. MS: no deformity or atrophy. Skin: warm and dry, no rash. Neuro:  Strength and sensation are intact. Psych: AAOx3.  Normal affect.  Labs    CBC  Recent Labs  04/09/16 1543  WBC 8.5  NEUTROABS 5.5  HGB 14.1  HCT 41.1  MCV 95.6  PLT 149*   Basic Metabolic Panel  Recent Labs  04/09/16 1543  NA 134*  K 4.0  CL 103  CO2 23  GLUCOSE 102*  BUN 13  CREATININE 0.90  CALCIUM 9.3   Liver Function Tests No results for input(s): AST, ALT, ALKPHOS, BILITOT, PROT, ALBUMIN in the last 72 hours. No results for input(s): LIPASE, AMYLASE in the last 72 hours. Cardiac Enzymes  Recent Labs  04/09/16 2316 04/10/16 0055 04/10/16 0525  TROPONINI 0.06* 0.14* 0.05*   BNP Invalid input(s): POCBNP D-Dimer No results for input(s): DDIMER in the last 72 hours. Hemoglobin A1C No results for input(s): HGBA1C in the last 72 hours. Fasting Lipid Panel No results for input(s): CHOL,  HDL, LDLCALC, TRIG, CHOLHDL, LDLDIRECT in the last 72 hours. Thyroid Function Tests No results for input(s): TSH, T4TOTAL, T3FREE, THYROIDAB in the last 72 hours.  Invalid input(s): FREET3  Telemetry    Unable to review personally as patient seen in nuc med  ECG    N/A   Radiology    No results found.  Cardiac Studies   Echo 04/12/16 LV EF: 55% -   60%  ------------------------------------------------------------------- History:   PMH:   Chest pain.  Chronic obstructive pulmonary disease.  ------------------------------------------------------------------- Study Conclusions  - Left ventricle: The cavity size was normal. Wall thickness was   increased in a pattern of mild LVH. Systolic function was normal.   The estimated ejection fraction was in the range of 55% to 60%.   Wall motion was normal; there were no regional wall motion    abnormalities. Left ventricular diastolic function parameters   were normal. - Atrial septum: No defect or patent foramen ovale was identified.  Pending Myoview today  Patient Profile     Johnathan BoastRupert D [name] a 60 y.o.malewith history of HIV/AIDS, ?COPD, hepatitis C, bipolar disorder, depression, prior MRSA bacteremia, polysubstance abuse (alcohol, tobacco, prior cocaine, THC), abnormal LFTs [facility] with acute intoxication and talked about chest pain.  Troponin has been borderline elevated and flat.  Non specific EKG.   Assessment & Plan    Chest pain: Now resolved. Echo showed LV EF of 55-60%, mild LVH. Troponin trend 0.04-->0.06-->0.14-->0.05. Pending Nuc result today.   Alcohol intoxication: Per primary  Signed, Takeela Peil, PA  04/12/2016, 11:12 AM

## 2016-04-20 ENCOUNTER — Encounter (HOSPITAL_COMMUNITY): Payer: Self-pay | Admitting: Emergency Medicine

## 2016-04-20 ENCOUNTER — Emergency Department (HOSPITAL_COMMUNITY)
Admission: EM | Admit: 2016-04-20 | Discharge: 2016-04-20 | Disposition: A | Discharge status: Home / Self Care | Payer: Medicare Other | Attending: Emergency Medicine | Admitting: Emergency Medicine

## 2016-04-20 DIAGNOSIS — Z5321 Procedure and treatment not carried out due to patient leaving prior to being seen by health care provider: Secondary | ICD-10-CM | POA: Diagnosis not present

## 2016-04-20 DIAGNOSIS — F17210 Nicotine dependence, cigarettes, uncomplicated: Secondary | ICD-10-CM | POA: Diagnosis not present

## 2016-04-20 DIAGNOSIS — F10929 Alcohol use, unspecified with intoxication, unspecified: Secondary | ICD-10-CM

## 2016-04-20 DIAGNOSIS — J449 Chronic obstructive pulmonary disease, unspecified: Secondary | ICD-10-CM | POA: Insufficient documentation

## 2016-04-20 DIAGNOSIS — F10120 Alcohol abuse with intoxication, uncomplicated: Secondary | ICD-10-CM | POA: Diagnosis not present

## 2016-04-20 NOTE — ED Notes (Signed)
Pt agitated, walking back and forth in front of bed, taking things out of pockets and repeatedly approaching nurse's station.  Pt calls out to passerby and demands food.  Pt becomes angry and curses at staff.  Pt has to be redirected to bed repeatedly.  Pt is uncooperative with RN and tech.

## 2016-04-20 NOTE — ED Notes (Signed)
[facility] Expected date:  Expected time:  Means of arrival:  Comments: ETOH from outside

## 2016-04-20 NOTE — ED Triage Notes (Signed)
Pt brought to ED via EMS.  Pt found on sidewalk intoxicated.  Denies any pain or physical complaints.  EMS unable to get vitals.  Pt A&O and ambulatory.  Pt reports hx psych issues.  Pt aggressive with staff and cursing.

## 2016-04-20 NOTE — ED Notes (Signed)
Pt ambulated unassisted and steady.  Pt says that he wants to leave repeatedly.  Pt walked out by security.  MD aware.

## 2016-04-20 NOTE — ED Notes (Addendum)
Pt. Keeps getting out of bed, pt. Made aware that he may fall if he gets out of bed. RN,Liza made aware.

## 2016-04-23 NOTE — ED Provider Notes (Signed)
I did not personally evaluate this patient.  He left the emergency department after triage prior to my evaluation   Johnathan BilisKevin Khye Hochstetler, MD 04/23/16 [phone]

## 2016-04-26 ENCOUNTER — Emergency Department (HOSPITAL_COMMUNITY)
Admission: EM | Admit: 2016-04-26 | Discharge: 2016-04-26 | Disposition: A | Discharge status: Home / Self Care | Payer: Medicare Other | Attending: Emergency Medicine | Admitting: Emergency Medicine

## 2016-04-26 ENCOUNTER — Encounter (HOSPITAL_COMMUNITY): Payer: Self-pay

## 2016-04-26 DIAGNOSIS — G43909 Migraine, unspecified, not intractable, without status migrainosus: Secondary | ICD-10-CM | POA: Diagnosis not present

## 2016-04-26 DIAGNOSIS — Z5321 Procedure and treatment not carried out due to patient leaving prior to being seen by health care provider: Secondary | ICD-10-CM | POA: Insufficient documentation

## 2016-04-26 LAB — CBC
HCT: 45.8 % (ref 39.0–52.0)
Hemoglobin: 15.6 g/dL (ref 13.0–17.0)
MCH: 32.2 pg (ref 26.0–34.0)
MCHC: 34.1 g/dL (ref 30.0–36.0)
MCV: 94.6 fL (ref 78.0–100.0)
Platelets: 176 10*3/uL (ref 150–400)
RBC: 4.84 MIL/uL (ref 4.22–5.81)
RDW: 12.6 % (ref 11.5–15.5)
WBC: 5.6 10*3/uL (ref 4.0–10.5)

## 2016-04-26 LAB — COMPREHENSIVE METABOLIC PANEL
ALT: 56 U/L (ref 17–63)
AST: 87 U/L — ABNORMAL HIGH (ref 15–41)
Albumin: 3.5 g/dL (ref 3.5–5.0)
Alkaline Phosphatase: 71 U/L (ref 38–126)
Anion gap: 8 (ref 5–15)
BUN: 10 mg/dL (ref 6–20)
CO2: 26 mmol/L (ref 22–32)
Calcium: 9.1 mg/dL (ref 8.9–10.3)
Chloride: 105 mmol/L (ref 101–111)
Creatinine, Ser: 0.94 mg/dL (ref 0.61–1.24)
GFR calc Af Amer: >60 mL/min (ref >60)
GFR calc non Af Amer: >60 mL/min (ref >60)
Glucose, Bld: 96 mg/dL (ref 65–99)
Potassium: 3.9 mmol/L (ref 3.5–5.1)
Sodium: 139 mmol/L (ref 135–145)
Total Bilirubin: 0.9 mg/dL (ref 0.3–1.2)
Total Protein: 9.2 g/dL — ABNORMAL HIGH (ref 6.5–8.1)

## 2016-04-26 LAB — ETHANOL: Alcohol, Ethyl (B): 237 mg/dL — ABNORMAL HIGH (ref <5)
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
## 2016-04-26 NOTE — ED Notes (Signed)
Police and security escorted pt off property.

## 2016-04-26 NOTE — ED Triage Notes (Signed)
Per EMS call was made for a man down/ unresponsive; upon arrival pt was up walking around and talking; ETOH on board; pt c/o of HA for several days; pt arrival talking loud and slightly aggressive speech toward staff; Security called and present at triage; Pt does not appear in any distress;
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
## 2016-05-09 ENCOUNTER — Emergency Department (HOSPITAL_COMMUNITY)
Admission: EM | Admit: 2016-05-09 | Discharge: 2016-05-09 | Disposition: A | Discharge status: Home / Self Care | Payer: Medicare Other | Attending: Emergency Medicine | Admitting: Emergency Medicine

## 2016-05-09 ENCOUNTER — Emergency Department (HOSPITAL_COMMUNITY): Payer: Medicare Other

## 2016-05-09 ENCOUNTER — Encounter (HOSPITAL_COMMUNITY): Payer: Self-pay | Admitting: Emergency Medicine

## 2016-05-09 DIAGNOSIS — F17210 Nicotine dependence, cigarettes, uncomplicated: Secondary | ICD-10-CM | POA: Diagnosis not present

## 2016-05-09 DIAGNOSIS — J449 Chronic obstructive pulmonary disease, unspecified: Secondary | ICD-10-CM | POA: Diagnosis not present

## 2016-05-09 DIAGNOSIS — S00512A Abrasion of oral cavity, initial encounter: Secondary | ICD-10-CM | POA: Insufficient documentation

## 2016-05-09 DIAGNOSIS — Y999 Unspecified external cause status: Secondary | ICD-10-CM | POA: Diagnosis not present

## 2016-05-09 DIAGNOSIS — S0081XA Abrasion of other part of head, initial encounter: Secondary | ICD-10-CM | POA: Insufficient documentation

## 2016-05-09 DIAGNOSIS — I10 Essential (primary) hypertension: Secondary | ICD-10-CM | POA: Insufficient documentation

## 2016-05-09 DIAGNOSIS — Z23 Encounter for immunization: Secondary | ICD-10-CM | POA: Diagnosis not present

## 2016-05-09 DIAGNOSIS — S00212A Abrasion of left eyelid and periocular area, initial encounter: Secondary | ICD-10-CM | POA: Diagnosis not present

## 2016-05-09 DIAGNOSIS — Z79899 Other long term (current) drug therapy: Secondary | ICD-10-CM | POA: Diagnosis not present

## 2016-05-09 DIAGNOSIS — S0990XA Unspecified injury of head, initial encounter: Secondary | ICD-10-CM | POA: Diagnosis present

## 2016-05-09 DIAGNOSIS — F10120 Alcohol abuse with intoxication, uncomplicated: Secondary | ICD-10-CM | POA: Diagnosis not present

## 2016-05-09 DIAGNOSIS — Y939 Activity, unspecified: Secondary | ICD-10-CM | POA: Diagnosis not present

## 2016-05-09 DIAGNOSIS — Y929 Unspecified place or not applicable: Secondary | ICD-10-CM | POA: Insufficient documentation

## 2016-05-09 DIAGNOSIS — T148XXA Other injury of unspecified body region, initial encounter: Secondary | ICD-10-CM

## 2016-05-09 DIAGNOSIS — F10929 Alcohol use, unspecified with intoxication, unspecified: Secondary | ICD-10-CM

## 2016-05-09 LAB — RAPID URINE DRUG SCREEN, HOSP PERFORMED
Amphetamines: NOT DETECTED
Barbiturates: NOT DETECTED
Benzodiazepines: NOT DETECTED
Cocaine: NOT DETECTED
Opiates: NOT DETECTED
Tetrahydrocannabinol: NOT DETECTED

## 2016-05-09 LAB — ETHANOL: Alcohol, Ethyl (B): 109 mg/dL — ABNORMAL HIGH (ref <5)

## 2016-05-09 MED ORDER — TETANUS-DIPHTH-ACELL PERTUSSIS 5-2.5-18.5 LF-MCG/0.5 IM SUSP
0.5000 mL | Freq: Once | INTRAMUSCULAR | Status: AC
  Administered 2016-05-09: 0.5 mL via INTRAMUSCULAR
  Filled 2016-05-09: qty 0.5

## 2016-05-09 NOTE — ED Notes (Signed)
Unable to get labs, pt is refusing to sit down or be still to be stuck
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
## 2016-05-09 NOTE — ED Notes (Signed)
Bed: [facility] Expected date:  Expected time:  Means of arrival:  Comments: 60 yo M  Fall, ETOH
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
## 2016-05-09 NOTE — ED Triage Notes (Signed)
158/90, hr78, rr16, cbg 133, so2 98, 12 lead sinus rhythm

## 2016-05-15 ENCOUNTER — Emergency Department (HOSPITAL_COMMUNITY)
Admission: EM | Admit: 2016-05-15 | Discharge: 2016-05-15 | Disposition: A | Discharge status: Home / Self Care | Payer: Medicare Other | Attending: Emergency Medicine | Admitting: Emergency Medicine

## 2016-05-15 ENCOUNTER — Emergency Department (HOSPITAL_COMMUNITY): Payer: Medicare Other

## 2016-05-15 ENCOUNTER — Encounter (HOSPITAL_COMMUNITY): Payer: Self-pay | Admitting: Emergency Medicine

## 2016-05-15 DIAGNOSIS — I10 Essential (primary) hypertension: Secondary | ICD-10-CM | POA: Insufficient documentation

## 2016-05-15 DIAGNOSIS — F17210 Nicotine dependence, cigarettes, uncomplicated: Secondary | ICD-10-CM | POA: Diagnosis not present

## 2016-05-15 DIAGNOSIS — J449 Chronic obstructive pulmonary disease, unspecified: Secondary | ICD-10-CM | POA: Diagnosis not present

## 2016-05-15 DIAGNOSIS — Y999 Unspecified external cause status: Secondary | ICD-10-CM | POA: Insufficient documentation

## 2016-05-15 DIAGNOSIS — Z79899 Other long term (current) drug therapy: Secondary | ICD-10-CM | POA: Insufficient documentation

## 2016-05-15 DIAGNOSIS — W1830XA Fall on same level, unspecified, initial encounter: Secondary | ICD-10-CM | POA: Insufficient documentation

## 2016-05-15 DIAGNOSIS — S80211A Abrasion, right knee, initial encounter: Secondary | ICD-10-CM | POA: Diagnosis not present

## 2016-05-15 DIAGNOSIS — Y92524 Gas station as the place of occurrence of the external cause: Secondary | ICD-10-CM | POA: Insufficient documentation

## 2016-05-15 DIAGNOSIS — R0789 Other chest pain: Secondary | ICD-10-CM | POA: Diagnosis not present

## 2016-05-15 DIAGNOSIS — F10229 Alcohol dependence with intoxication, unspecified: Secondary | ICD-10-CM | POA: Diagnosis not present

## 2016-05-15 DIAGNOSIS — Y92009 Unspecified place in unspecified non-institutional (private) residence as the place of occurrence of the external cause: Secondary | ICD-10-CM

## 2016-05-15 DIAGNOSIS — Y9339 Activity, other involving climbing, rappelling and jumping off: Secondary | ICD-10-CM | POA: Diagnosis not present

## 2016-05-15 DIAGNOSIS — R51 Headache: Secondary | ICD-10-CM | POA: Insufficient documentation

## 2016-05-15 DIAGNOSIS — S8991XA Unspecified injury of right lower leg, initial encounter: Secondary | ICD-10-CM | POA: Diagnosis present

## 2016-05-15 DIAGNOSIS — W19XXXA Unspecified fall, initial encounter: Secondary | ICD-10-CM

## 2016-05-15 DIAGNOSIS — F10920 Alcohol use, unspecified with intoxication, uncomplicated: Secondary | ICD-10-CM

## 2016-05-15 LAB — CBC WITH DIFFERENTIAL/PLATELET
Basophils Absolute: 0 10*3/uL (ref 0.0–0.1)
Basophils Relative: 0 %
Eosinophils Absolute: 0.2 10*3/uL (ref 0.0–0.7)
Eosinophils Relative: 3 %
HCT: 43.2 % (ref 39.0–52.0)
Hemoglobin: 14.7 g/dL (ref 13.0–17.0)
Lymphocytes Relative: 26 %
Lymphs Abs: 1.7 10*3/uL (ref 0.7–4.0)
MCH: 32.2 pg (ref 26.0–34.0)
MCHC: 34 g/dL (ref 30.0–36.0)
MCV: 94.5 fL (ref 78.0–100.0)
Monocytes Absolute: 0.5 10*3/uL (ref 0.1–1.0)
Monocytes Relative: 8 %
Neutro Abs: 4.1 10*3/uL (ref 1.7–7.7)
Neutrophils Relative %: 63 %
Platelets: 150 10*3/uL (ref 150–400)
RBC: 4.57 MIL/uL (ref 4.22–5.81)
RDW: 12.6 % (ref 11.5–15.5)
WBC: 6.5 10*3/uL (ref 4.0–10.5)

## 2016-05-15 LAB — COMPREHENSIVE METABOLIC PANEL
ALT: 55 U/L (ref 17–63)
AST: 93 U/L — ABNORMAL HIGH (ref 15–41)
Albumin: 3.4 g/dL — ABNORMAL LOW (ref 3.5–5.0)
Alkaline Phosphatase: 71 U/L (ref 38–126)
Anion gap: 10 (ref 5–15)
BUN: 11 mg/dL (ref 6–20)
CO2: 24 mmol/L (ref 22–32)
Calcium: 9.1 mg/dL (ref 8.9–10.3)
Chloride: 98 mmol/L — ABNORMAL LOW (ref 101–111)
Creatinine, Ser: 0.92 mg/dL (ref 0.61–1.24)
GFR calc Af Amer: >60 mL/min (ref >60)
GFR calc non Af Amer: >60 mL/min (ref >60)
Glucose, Bld: 113 mg/dL — ABNORMAL HIGH (ref 65–99)
Potassium: 3.9 mmol/L (ref 3.5–5.1)
Sodium: 132 mmol/L — ABNORMAL LOW (ref 135–145)
Total Bilirubin: 0.8 mg/dL (ref 0.3–1.2)
Total Protein: 8.6 g/dL — ABNORMAL HIGH (ref 6.5–8.1)

## 2016-05-15 LAB — I-STAT TROPONIN, ED: Troponin i, poc: 0.01 ng/mL (ref 0.00–0.08)

## 2016-05-15 LAB — ETHANOL: Alcohol, Ethyl (B): 253 mg/dL — ABNORMAL HIGH (ref <5)

## 2016-05-15 LAB — TROPONIN I: Troponin I: <0.03 ng/mL (ref <0.03)
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
## 2016-05-15 NOTE — ED Notes (Signed)
Pt refusing to keep monitoring equipment on or stay on ED stretcher. Pt made aware of reason monitoring equipment is needed and hooked back up at this time.

## 2016-05-15 NOTE — Discharge Instructions (Signed)
Please follow up with family doctor. Stop drinking alcohol.

## 2016-05-15 NOTE — ED Notes (Signed)
Pt continues to remove pulse ox and cardiac monitor after explaining need for monitoring. Pt also coming to nurses station and calling nurses vulgar names. Asked to return to room. Security escorting pt.
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
## 2016-05-15 NOTE — ED Triage Notes (Addendum)
Per EMS: pt c/o CP and was seen here this am for same; pt sts mid sternal CP x 3 days worse with movement; pt was sleeping on park bench

## 2016-05-15 NOTE — ED Notes (Signed)
ED Provider at bedside. 

## 2016-05-24 ENCOUNTER — Ambulatory Visit: Payer: Self-pay | Admitting: Infectious Disease

## 2016-06-06 ENCOUNTER — Ambulatory Visit: Payer: Self-pay | Admitting: Infectious Disease

## 2016-06-09 ENCOUNTER — Ambulatory Visit: Payer: Self-pay | Admitting: Infectious Disease

## 2016-06-23 ENCOUNTER — Telehealth: Payer: Self-pay | Admitting: *Deleted

## 2016-06-23 NOTE — Telephone Encounter (Signed)
[name], case manager called stating [facility] stating he had just run out of his meds and needed refills. Last labs in November, patient was undetectable. He has had several no shows with Dr. Daiva EvesVan Dam. Informed [name] that patient does have refills at [facility] and made an appt with Dr. Clinton GallantVan Dam's first available for 07/21/16. [name] will attempt to bring patient to this appointment.

## 2016-06-23 NOTE — Telephone Encounter (Signed)
[name] called back and pharmacy told him the Triumeq had been discontinued. [name], pharmacist agreed to see patient to find out if he is currently on his meds. Johnathan MolaJacqueline Nimco Bivens

## 2016-06-24 ENCOUNTER — Emergency Department (HOSPITAL_COMMUNITY)
Admission: EM | Admit: 2016-06-24 | Discharge: 2016-06-24 | Disposition: A | Discharge status: Home / Self Care | Payer: Medicare Other | Attending: Emergency Medicine | Admitting: Emergency Medicine

## 2016-06-24 ENCOUNTER — Emergency Department (HOSPITAL_COMMUNITY): Payer: Medicare Other

## 2016-06-24 ENCOUNTER — Encounter (HOSPITAL_COMMUNITY): Payer: Self-pay | Admitting: Emergency Medicine

## 2016-06-24 DIAGNOSIS — F17210 Nicotine dependence, cigarettes, uncomplicated: Secondary | ICD-10-CM | POA: Diagnosis not present

## 2016-06-24 DIAGNOSIS — F10120 Alcohol abuse with intoxication, uncomplicated: Secondary | ICD-10-CM | POA: Insufficient documentation

## 2016-06-24 DIAGNOSIS — R930 Abnormal findings on diagnostic imaging of skull and head, not elsewhere classified: Secondary | ICD-10-CM | POA: Diagnosis not present

## 2016-06-24 DIAGNOSIS — I10 Essential (primary) hypertension: Secondary | ICD-10-CM | POA: Insufficient documentation

## 2016-06-24 DIAGNOSIS — F10920 Alcohol use, unspecified with intoxication, uncomplicated: Secondary | ICD-10-CM

## 2016-06-24 DIAGNOSIS — J449 Chronic obstructive pulmonary disease, unspecified: Secondary | ICD-10-CM | POA: Insufficient documentation

## 2016-06-24 DIAGNOSIS — Z79899 Other long term (current) drug therapy: Secondary | ICD-10-CM | POA: Diagnosis not present

## 2016-06-24 LAB — CBC WITH DIFFERENTIAL/PLATELET
Basophils Absolute: 0 10*3/uL (ref 0.0–0.1)
Basophils Relative: 0 %
Eosinophils Absolute: 0.2 10*3/uL (ref 0.0–0.7)
Eosinophils Relative: 3 %
HCT: 39.4 % (ref 39.0–52.0)
Hemoglobin: 12.6 g/dL — ABNORMAL LOW (ref 13.0–17.0)
Lymphocytes Relative: 27 %
Lymphs Abs: 1.4 10*3/uL (ref 0.7–4.0)
MCH: 30.9 pg (ref 26.0–34.0)
MCHC: 32 g/dL (ref 30.0–36.0)
MCV: 96.6 fL (ref 78.0–100.0)
Monocytes Absolute: 0.4 10*3/uL (ref 0.1–1.0)
Monocytes Relative: 8 %
Neutro Abs: 3.2 10*3/uL (ref 1.7–7.7)
Neutrophils Relative %: 62 %
Platelets: 122 10*3/uL — ABNORMAL LOW (ref 150–400)
RBC: 4.08 MIL/uL — ABNORMAL LOW (ref 4.22–5.81)
RDW: 13.9 % (ref 11.5–15.5)
WBC: 5.2 10*3/uL (ref 4.0–10.5)

## 2016-06-24 LAB — COMPREHENSIVE METABOLIC PANEL
ALT: 67 U/L — ABNORMAL HIGH (ref 17–63)
AST: 102 U/L — ABNORMAL HIGH (ref 15–41)
Albumin: 3.7 g/dL (ref 3.5–5.0)
Alkaline Phosphatase: 61 U/L (ref 38–126)
Anion gap: 6 (ref 5–15)
BUN: 13 mg/dL (ref 6–20)
CO2: 30 mmol/L (ref 22–32)
Calcium: 9.4 mg/dL (ref 8.9–10.3)
Chloride: 106 mmol/L (ref 101–111)
Creatinine, Ser: 1.1 mg/dL (ref 0.61–1.24)
GFR calc Af Amer: >60 mL/min (ref >60)
GFR calc non Af Amer: >60 mL/min (ref >60)
Glucose, Bld: 88 mg/dL (ref 65–99)
Potassium: 3.6 mmol/L (ref 3.5–5.1)
Sodium: 142 mmol/L (ref 135–145)
Total Bilirubin: 0.6 mg/dL (ref 0.3–1.2)
Total Protein: 9 g/dL — ABNORMAL HIGH (ref 6.5–8.1)

## 2016-06-24 LAB — ETHANOL: Alcohol, Ethyl (B): 233 mg/dL — ABNORMAL HIGH (ref <5)

## 2016-06-24 MED ORDER — CHLORDIAZEPOXIDE HCL 25 MG PO CAPS: ORAL_CAPSULE | ORAL | 0 refills | Status: DC
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
## 2016-06-24 NOTE — ED Notes (Signed)
Bed: [id number] Expected date:  Expected time:  Means of arrival:  Comments: EMS-ETOH
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
## 2016-06-24 NOTE — ED Notes (Signed)
Pt resting at this time.

## 2016-06-24 NOTE — ED Notes (Signed)
PT awake and alert x 4 . Pt was given 2 sandwiches and has been ambulating in room and hall.

## 2016-06-24 NOTE — ED Notes (Signed)
ED Provider at bedside. 
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
## 2016-06-24 NOTE — Discharge Instructions (Signed)
You are given some resources for detox

## 2016-06-25 ENCOUNTER — Encounter (HOSPITAL_COMMUNITY): Payer: Self-pay | Admitting: Emergency Medicine

## 2016-06-25 ENCOUNTER — Emergency Department (HOSPITAL_COMMUNITY)
Admission: EM | Admit: 2016-06-25 | Discharge: 2016-06-25 | Disposition: A | Discharge status: Home / Self Care | Payer: Medicare Other | Attending: Emergency Medicine | Admitting: Emergency Medicine

## 2016-06-25 DIAGNOSIS — F10929 Alcohol use, unspecified with intoxication, unspecified: Secondary | ICD-10-CM

## 2016-06-25 DIAGNOSIS — Z79899 Other long term (current) drug therapy: Secondary | ICD-10-CM | POA: Diagnosis not present

## 2016-06-25 DIAGNOSIS — Z5181 Encounter for therapeutic drug level monitoring: Secondary | ICD-10-CM | POA: Insufficient documentation

## 2016-06-25 DIAGNOSIS — F17210 Nicotine dependence, cigarettes, uncomplicated: Secondary | ICD-10-CM | POA: Diagnosis not present

## 2016-06-25 DIAGNOSIS — F10129 Alcohol abuse with intoxication, unspecified: Secondary | ICD-10-CM | POA: Insufficient documentation

## 2016-06-25 DIAGNOSIS — I10 Essential (primary) hypertension: Secondary | ICD-10-CM | POA: Insufficient documentation

## 2016-06-25 DIAGNOSIS — J449 Chronic obstructive pulmonary disease, unspecified: Secondary | ICD-10-CM | POA: Diagnosis not present

## 2016-06-25 DIAGNOSIS — R05 Cough: Secondary | ICD-10-CM | POA: Diagnosis present

## 2016-06-25 LAB — RAPID URINE DRUG SCREEN, HOSP PERFORMED
Amphetamines: NOT DETECTED
Barbiturates: NOT DETECTED
Benzodiazepines: POSITIVE — AB
Cocaine: NOT DETECTED
Opiates: NOT DETECTED
Tetrahydrocannabinol: NOT DETECTED

## 2016-06-25 LAB — COMPREHENSIVE METABOLIC PANEL
ALT: 70 U/L — ABNORMAL HIGH (ref 17–63)
AST: 106 U/L — ABNORMAL HIGH (ref 15–41)
Albumin: 3.6 g/dL (ref 3.5–5.0)
Alkaline Phosphatase: 61 U/L (ref 38–126)
Anion gap: 7 (ref 5–15)
BUN: 12 mg/dL (ref 6–20)
CO2: 32 mmol/L (ref 22–32)
Calcium: 9.4 mg/dL (ref 8.9–10.3)
Chloride: 103 mmol/L (ref 101–111)
Creatinine, Ser: 0.86 mg/dL (ref 0.61–1.24)
GFR calc Af Amer: >60 mL/min (ref >60)
GFR calc non Af Amer: >60 mL/min (ref >60)
Glucose, Bld: 92 mg/dL (ref 65–99)
Potassium: 3.6 mmol/L (ref 3.5–5.1)
Sodium: 142 mmol/L (ref 135–145)
Total Bilirubin: 0.4 mg/dL (ref 0.3–1.2)
Total Protein: 8.2 g/dL — ABNORMAL HIGH (ref 6.5–8.1)

## 2016-06-25 LAB — CBC
HCT: 39.6 % (ref 39.0–52.0)
Hemoglobin: 13.2 g/dL (ref 13.0–17.0)
MCH: 31.6 pg (ref 26.0–34.0)
MCHC: 33.3 g/dL (ref 30.0–36.0)
MCV: 94.7 fL (ref 78.0–100.0)
Platelets: 140 10*3/uL — ABNORMAL LOW (ref 150–400)
RBC: 4.18 MIL/uL — ABNORMAL LOW (ref 4.22–5.81)
RDW: 13.9 % (ref 11.5–15.5)
WBC: 5.5 10*3/uL (ref 4.0–10.5)

## 2016-06-25 LAB — ETHANOL: Alcohol, Ethyl (B): 210 mg/dL — ABNORMAL HIGH (ref <5)
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
## 2016-06-25 NOTE — ED Notes (Signed)
ED Provider at bedside for re-evaluation 

## 2016-06-25 NOTE — ED Notes (Signed)
MD at bedside.  Patient asleep.

## 2016-06-25 NOTE — ED Notes (Signed)
Patient sitting on side of bed eating a meal.  No c/o pain.

## 2016-06-25 NOTE — ED Notes (Signed)
Bed: [id number] Expected date: 06/25/16 Expected time: 2:13 PM Means of arrival: Ambulance Comments: ETOH

## 2016-06-25 NOTE — Discharge Instructions (Signed)
RESOURCE GUIDE ° °Chronic Pain Problems: °Contact [facility]  [phone] °Patients need to be referred by their primary care doctor. ° °Insufficient Money for Medicine: °Contact [employer]:  call [phone] ° °No Primary Care Doctor: °Call [facility]  [phone] - can help you locate a primary care doctor that  accepts your insurance, provides certain services, etc. °Physician Referral Service- [phone] ° °Agencies that provide inexpensive medical care: °[facility]  [phone] °[facility]  [phone] °[facility]  [phone] °[facility]  [phone] °[facility]anned Parenthood  [phone] °[facility]  [phone] ° °Medicaid-accepting [place] Providers: °[facility]- [address] ° [phone], Mon-Fri 9am-7pm, Sat 9am-1pm °[facility]- [address], [address] ° [phone] °[facility]- [address], [address] ° [phone] °[facility]- [address] [place] [address] ° [phone] °[facility]- [address], [address], [phone] ° Only accepts [place] Access Medicaid patients after they have their name  applied to their card ° °Self Pay (no insurance) in [place]: °Sickle Cell Patients - [facility] ° [address], [phone] °[facility]- [address] ° [phone] °      -      [facility]- [address] °      -     [facility]- see information above (Speak to Pam H if you do not have insurance) °      -  [facility] [place]- [address],  [phone] °      -  [facility]- [address] [place] [address], [phone] °      -  Dr Osei-Bonsu-  [address], [address], [place], [phone] °      -  [facility] - [address], [phone] °      -  [facility]- [address] [place] [address], [phone], also [address]  [address],  [phone] °      -     [facility]- [address], [phone], 1st & 3rd Saturday °        every month, 10am-1pm ° -     [facility] °  [address], [place]. °  Phone:  [phone], Fax:  [phone]. Hours of Operation:  9 am - 6 pm, M-F. ° -     [facility] °  [address], [address], [place] °  Phone: [phone], Fax: [phone]. Hours of Operation:  8:30 am - 5:30 pm, M-F. ° ° ° °Dental Assistance °If unable to pay or uninsured, contact:  [place] [facility] to become qualified for the adult dental clinic. ° °Patients with Medicaid: [facility] °[address], [phone] °[address], [phone] ° °If unable to pay, or uninsured, contact [place] [facility] ([phone] in [place], [phone] in [place]) to become qualified for the adult dental clinic ° °[facility] °[address] °[place], [place] [address] °[phone] °[email or web address] ° °Other Low-Cost Community Dental Services: °[facility]- [address], [place], [place], [address], [phone], Ext. [phone], 2nd and 4th Thursday of the month at 6:30am.  10 clients each day by appointment, can sometimes see walk-in patients if someone does not show for an appointment. °  [facility]- [address], [place], [place], [address], [phone] °[facility]- [address], [place], [place], [address], [phone] °[facility]- [phone] °[facility]- [phone] °[facility]- [phone]  °

## 2016-06-29 ENCOUNTER — Telehealth: Payer: Self-pay | Admitting: *Deleted

## 2016-06-29 NOTE — Telephone Encounter (Signed)
On 02/17/16 RN received a referral for [facility].Prior to making a attempt to contact the patient I have discussed what may be the best options for Mr. Cyndie MullWeekes with Mitch([facility] Bridge Counselor) who is currently assisting the patient. RN contacted [name] today to offer my services and to remind him of his upcoming Pharmacy appt on the 4th of October. At this time 3 attempts were made within the 5 day time frame  Rn followed up on the referral again today but was unable to get answer and the automated message stated the memory is full and would not accept a message.  RN noted that [name] is connected with [facility] so I will try and meet him there. I also noted that he has a upcoming appt on march 1st that I will try and meet him then if I cannot reach him prior too

## 2016-07-02 ENCOUNTER — Emergency Department (HOSPITAL_COMMUNITY)
Admission: EM | Admit: 2016-07-02 | Discharge: 2016-07-04 | Disposition: A | Discharge status: Home / Self Care | Payer: Medicare Other | Attending: Emergency Medicine | Admitting: Emergency Medicine

## 2016-07-02 ENCOUNTER — Encounter (HOSPITAL_COMMUNITY): Payer: Self-pay | Admitting: Emergency Medicine

## 2016-07-02 DIAGNOSIS — F1290 Cannabis use, unspecified, uncomplicated: Secondary | ICD-10-CM | POA: Diagnosis not present

## 2016-07-02 DIAGNOSIS — F1010 Alcohol abuse, uncomplicated: Secondary | ICD-10-CM

## 2016-07-02 DIAGNOSIS — I10 Essential (primary) hypertension: Secondary | ICD-10-CM | POA: Diagnosis not present

## 2016-07-02 DIAGNOSIS — F10929 Alcohol use, unspecified with intoxication, unspecified: Secondary | ICD-10-CM | POA: Diagnosis present

## 2016-07-02 DIAGNOSIS — F10129 Alcohol abuse with intoxication, unspecified: Secondary | ICD-10-CM | POA: Diagnosis not present

## 2016-07-02 DIAGNOSIS — F17210 Nicotine dependence, cigarettes, uncomplicated: Secondary | ICD-10-CM | POA: Insufficient documentation

## 2016-07-02 DIAGNOSIS — J449 Chronic obstructive pulmonary disease, unspecified: Secondary | ICD-10-CM | POA: Insufficient documentation

## 2016-07-02 DIAGNOSIS — R45851 Suicidal ideations: Secondary | ICD-10-CM

## 2016-07-02 DIAGNOSIS — F10920 Alcohol use, unspecified with intoxication, uncomplicated: Secondary | ICD-10-CM

## 2016-07-02 DIAGNOSIS — Z79899 Other long term (current) drug therapy: Secondary | ICD-10-CM | POA: Diagnosis not present

## 2016-07-02 DIAGNOSIS — F1490 Cocaine use, unspecified, uncomplicated: Secondary | ICD-10-CM | POA: Diagnosis not present

## 2016-07-02 LAB — COMPREHENSIVE METABOLIC PANEL
ALT: 74 U/L — ABNORMAL HIGH (ref 17–63)
AST: 100 U/L — ABNORMAL HIGH (ref 15–41)
Albumin: 3.4 g/dL — ABNORMAL LOW (ref 3.5–5.0)
Alkaline Phosphatase: 61 U/L (ref 38–126)
Anion gap: 9 (ref 5–15)
BUN: 8 mg/dL (ref 6–20)
CO2: 24 mmol/L (ref 22–32)
Calcium: 9 mg/dL (ref 8.9–10.3)
Chloride: 103 mmol/L (ref 101–111)
Creatinine, Ser: 0.79 mg/dL (ref 0.61–1.24)
GFR calc Af Amer: >60 mL/min (ref >60)
GFR calc non Af Amer: >60 mL/min (ref >60)
Glucose, Bld: 93 mg/dL (ref 65–99)
Potassium: 3.5 mmol/L (ref 3.5–5.1)
Sodium: 136 mmol/L (ref 135–145)
Total Bilirubin: 0.8 mg/dL (ref 0.3–1.2)
Total Protein: 8.6 g/dL — ABNORMAL HIGH (ref 6.5–8.1)

## 2016-07-02 LAB — ACETAMINOPHEN LEVEL: Acetaminophen (Tylenol), Serum: <10 ug/mL — ABNORMAL LOW (ref 10–30)

## 2016-07-02 LAB — CBC WITH DIFFERENTIAL/PLATELET
Basophils Absolute: 0 10*3/uL (ref 0.0–0.1)
Basophils Relative: 0 %
Eosinophils Absolute: 0.2 10*3/uL (ref 0.0–0.7)
Eosinophils Relative: 3 %
HCT: 43.3 % (ref 39.0–52.0)
Hemoglobin: 14.5 g/dL (ref 13.0–17.0)
Lymphocytes Relative: 32 %
Lymphs Abs: 2 10*3/uL (ref 0.7–4.0)
MCH: 31.9 pg (ref 26.0–34.0)
MCHC: 33.5 g/dL (ref 30.0–36.0)
MCV: 95.4 fL (ref 78.0–100.0)
Monocytes Absolute: 0.6 10*3/uL (ref 0.1–1.0)
Monocytes Relative: 9 %
Neutro Abs: 3.4 10*3/uL (ref 1.7–7.7)
Neutrophils Relative %: 55 %
Platelets: 108 10*3/uL — ABNORMAL LOW (ref 150–400)
RBC: 4.54 MIL/uL (ref 4.22–5.81)
RDW: 13.9 % (ref 11.5–15.5)
WBC: 6.1 10*3/uL (ref 4.0–10.5)

## 2016-07-02 LAB — URINALYSIS, ROUTINE W REFLEX MICROSCOPIC
Bilirubin Urine: NEGATIVE
Glucose, UA: NEGATIVE mg/dL
Hgb urine dipstick: NEGATIVE
Ketones, ur: NEGATIVE mg/dL
Leukocytes, UA: NEGATIVE
Nitrite: NEGATIVE
Protein, ur: NEGATIVE mg/dL
Specific Gravity, Urine: 1.002 — ABNORMAL LOW (ref 1.005–1.030)
pH: 6 (ref 5.0–8.0)

## 2016-07-02 LAB — RAPID URINE DRUG SCREEN, HOSP PERFORMED
Amphetamines: NOT DETECTED
Barbiturates: NOT DETECTED
Benzodiazepines: POSITIVE — AB
Cocaine: NOT DETECTED
Opiates: NOT DETECTED
Tetrahydrocannabinol: NOT DETECTED

## 2016-07-02 LAB — ETHANOL: Alcohol, Ethyl (B): 315 mg/dL (ref <5)

## 2016-07-02 LAB — SALICYLATE LEVEL: Salicylate Lvl: <7 mg/dL (ref 2.8–30.0)

## 2016-07-02 MED ORDER — ZIPRASIDONE MESYLATE 20 MG IM SOLR
10.0000 mg | Freq: Once | INTRAMUSCULAR | Status: AC
  Administered 2016-07-02: 10 mg via INTRAMUSCULAR
  Filled 2016-07-02: qty 20

## 2016-07-02 MED ORDER — LORAZEPAM 1 MG PO TABS
1.0000 mg | ORAL_TABLET | Freq: Once | ORAL | Status: AC
  Administered 2016-07-02: 1 mg via ORAL
  Filled 2016-07-02: qty 1
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
## 2016-07-02 NOTE — ED Notes (Signed)
Medicated and placed on stretcher.  While in another room staff at desk heard a noise and witnessed patient slide from stretcher to the floor.  No injury noted.  Safety Zone completed.  Physician and charge nurse notified.  Safety sitter placed.

## 2016-07-02 NOTE — ED Triage Notes (Signed)
Ems called by bystanders due to patient being on sidewalk drunk.  Reports having pain in right kidney.  Combative at triage.  Asking for food.

## 2016-07-02 NOTE — ED Notes (Signed)
Resting soundly at this time.  Physician made aware of low blood pressure.  All other vital wnl.

## 2016-07-02 NOTE — ED Notes (Signed)
Dr. Madilyn Hookees made aware of critical ethanol level of 315.

## 2016-07-03 DIAGNOSIS — F10129 Alcohol abuse with intoxication, unspecified: Secondary | ICD-10-CM | POA: Diagnosis not present

## 2016-07-03 MED ORDER — LORAZEPAM 1 MG PO TABS
0.0000 mg | ORAL_TABLET | Freq: Four times a day (QID) | ORAL | Status: DC
  Administered 2016-07-03: 1 mg via ORAL
  Administered 2016-07-04: 2 mg via ORAL
  Filled 2016-07-03: qty 2
  Filled 2016-07-03: qty 1

## 2016-07-03 MED ORDER — GABAPENTIN 100 MG PO CAPS
100.0000 mg | ORAL_CAPSULE | Freq: Three times a day (TID) | ORAL | Status: DC
  Administered 2016-07-03 – 2016-07-04 (×4): 100 mg via ORAL
  Filled 2016-07-03 (×4): qty 1

## 2016-07-03 MED ORDER — ACETAMINOPHEN 325 MG PO TABS
650.0000 mg | ORAL_TABLET | ORAL | Status: DC | PRN
  Administered 2016-07-03 – 2016-07-04 (×2): 650 mg via ORAL
  Filled 2016-07-03 (×2): qty 2

## 2016-07-03 MED ORDER — VITAMIN B-1 100 MG PO TABS
100.0000 mg | ORAL_TABLET | Freq: Every day | ORAL | Status: DC
  Administered 2016-07-03 – 2016-07-04 (×2): 100 mg via ORAL
  Filled 2016-07-03 (×2): qty 1

## 2016-07-03 MED ORDER — SERTRALINE HCL 50 MG PO TABS
100.0000 mg | ORAL_TABLET | Freq: Every day | ORAL | Status: DC
  Administered 2016-07-03 – 2016-07-04 (×2): 100 mg via ORAL
  Filled 2016-07-03 (×2): qty 1
  Filled 2016-07-03: qty 2

## 2016-07-03 MED ORDER — ABACAVIR-DOLUTEGRAVIR-LAMIVUD 600-50-300 MG PO TABS
1.0000 | ORAL_TABLET | Freq: Every day | ORAL | Status: DC
  Administered 2016-07-03 – 2016-07-04 (×2): 1 via ORAL
  Filled 2016-07-03 (×4): qty 1

## 2016-07-03 MED ORDER — CITALOPRAM HYDROBROMIDE 10 MG PO TABS
40.0000 mg | ORAL_TABLET | Freq: Every day | ORAL | Status: DC
  Administered 2016-07-03: 40 mg via ORAL
  Filled 2016-07-03: qty 4

## 2016-07-03 MED ORDER — THIAMINE HCL 100 MG/ML IJ SOLN
100.0000 mg | Freq: Every day | INTRAMUSCULAR | Status: DC
  Filled 2016-07-03: qty 2

## 2016-07-03 MED ORDER — IBUPROFEN 400 MG PO TABS
600.0000 mg | ORAL_TABLET | Freq: Three times a day (TID) | ORAL | Status: DC | PRN
  Administered 2016-07-04: 600 mg via ORAL
  Filled 2016-07-03: qty 1

## 2016-07-03 MED ORDER — LORAZEPAM 1 MG PO TABS: 0.0000 mg | ORAL_TABLET | Freq: Two times a day (BID) | ORAL | Status: DC

## 2016-07-03 MED ORDER — RISPERIDONE 1 MG PO TABS
2.0000 mg | ORAL_TABLET | Freq: Every day | ORAL | Status: DC
  Administered 2016-07-03: 2 mg via ORAL
  Filled 2016-07-03: qty 2

## 2016-07-03 MED ORDER — ALBUTEROL SULFATE HFA 108 (90 BASE) MCG/ACT IN AERS: 2.0000 | INHALATION_SPRAY | Freq: Four times a day (QID) | RESPIRATORY_TRACT | Status: DC | PRN

## 2016-07-03 MED ORDER — HYDROXYZINE HCL 50 MG PO TABS
50.0000 mg | ORAL_TABLET | Freq: Two times a day (BID) | ORAL | Status: DC | PRN
  Administered 2016-07-04 (×2): 50 mg via ORAL
  Filled 2016-07-03 (×2): qty 1
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
## 2016-07-03 NOTE — ED Notes (Signed)
Talking w/sitter. nad.

## 2016-07-03 NOTE — ED Notes (Signed)
Ordered breakfast tray  

## 2016-07-03 NOTE — ED Notes (Signed)
Pt sleeping. 

## 2016-07-03 NOTE — ED Notes (Signed)
Pt talking to sitter. nad.

## 2016-07-03 NOTE — ED Notes (Signed)
Placed Pt in Paper Scrubs

## 2016-07-03 NOTE — ED Notes (Signed)
Pt arrived to F7 via w/c. Pt's belongings x 2 bags placed at nurses' desk for inventory. Pt alert, oriented, calm, cooperative. Pt voiced understanding and signed Medical Clearance Pt Policy form - copy given to pt - original placed on clipboard. Pt given Ginger Ale as requested.

## 2016-07-03 NOTE — ED Notes (Signed)
Pt awake in bed eating breakfast tray

## 2016-07-03 NOTE — ED Notes (Signed)
More awake at this time asking for a sandwich.  Noted to be more coherent than earlier in the shift.

## 2016-07-03 NOTE — ED Notes (Signed)
Pt awake and eating lunch.

## 2016-07-03 NOTE — ED Notes (Signed)
Pt awake and eating second breakfast

## 2016-07-03 NOTE — ED Notes (Signed)
O2 sat noted to be 88-89% while sleeping soundly.  Placed on O2 at 2L.  Sat now at 95%.  Continues to sleep soundly.

## 2016-07-03 NOTE — ED Notes (Signed)
[name], [employer], aware [facility] moved to [facility] [employer].

## 2016-07-03 NOTE — ED Provider Notes (Addendum)
12:30 AM  Assumed care from Dr. Madilyn Hookees.  Patient is a 61 year old male who is well known to our emergency department with history of alcohol abuse. Patient agitated and required sedation here. Will need to be reevaluated when clinically sober. Alcohol level was 315 at 8:55 PM.  4:50 AM  Pt now more awake and asking for something to eat. He still has slurred speech. He denies any pain. Reports he is still having SI and HI. States "I want to kill all the mother fuckers". Unable to contract for safety. We will consult TTS for disposition.   Layla MawKristen N Latoria Dry, DO 07/03/16 0455   5:30 AM  D/w [place] with behavioral health. They recommend psychiatric reevaluation later today.   Layla MawKristen N Trelon Plush, DO 07/03/16 [phone]

## 2016-07-04 DIAGNOSIS — Z79899 Other long term (current) drug therapy: Secondary | ICD-10-CM

## 2016-07-04 DIAGNOSIS — Z888 Allergy status to other drugs, medicaments and biological substances status: Secondary | ICD-10-CM | POA: Diagnosis not present

## 2016-07-04 DIAGNOSIS — F10129 Alcohol abuse with intoxication, unspecified: Secondary | ICD-10-CM | POA: Diagnosis not present

## 2016-07-04 DIAGNOSIS — F1290 Cannabis use, unspecified, uncomplicated: Secondary | ICD-10-CM | POA: Diagnosis not present

## 2016-07-04 DIAGNOSIS — F1490 Cocaine use, unspecified, uncomplicated: Secondary | ICD-10-CM

## 2016-07-04 DIAGNOSIS — F17210 Nicotine dependence, cigarettes, uncomplicated: Secondary | ICD-10-CM | POA: Diagnosis not present

## 2016-07-04 DIAGNOSIS — F10920 Alcohol use, unspecified with intoxication, uncomplicated: Secondary | ICD-10-CM

## 2016-07-04 MED ORDER — LORAZEPAM 1 MG PO TABS: 2.0000 mg | ORAL_TABLET | Freq: Once | ORAL | Status: DC

## 2016-07-04 NOTE — ED Provider Notes (Signed)
Pt feels better No signs of withdrawal Will f/u with outpatient resources   Lorre NickAnthony Sola Margolis, MD 07/04/16 2025

## 2016-07-04 NOTE — ED Notes (Signed)
Patient was given a snack and Drink, A regular diet ordered for Lunch.

## 2016-07-04 NOTE — ED Notes (Addendum)
Spoke w/ BH and they stated they recommend pt be d/c.

## 2016-07-04 NOTE — ED Triage Notes (Signed)
TTS done 

## 2016-07-04 NOTE — Progress Notes (Signed)
CSW received call from Dr. Freida BusmanAllen, who is [facility].  Dr. Freida BusmanAllen asks that NP who is recommending d/c contact him.. CSW will contact NP via email to ask her to contact Dr. Freida BusmanAllen.  Carney Bern.Patryce Depriest T. Kaylyn LimSutter, MSW, LCSWA Clinical Social Work Disposition [phone]
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
## 2016-07-06 ENCOUNTER — Emergency Department (HOSPITAL_COMMUNITY)
Admission: EM | Admit: 2016-07-06 | Discharge: 2016-07-06 | Disposition: A | Discharge status: Home / Self Care | Payer: Medicare Other | Attending: Dermatology | Admitting: Dermatology

## 2016-07-06 ENCOUNTER — Encounter (HOSPITAL_COMMUNITY): Payer: Self-pay | Admitting: Emergency Medicine

## 2016-07-06 DIAGNOSIS — M549 Dorsalgia, unspecified: Secondary | ICD-10-CM | POA: Insufficient documentation

## 2016-07-06 DIAGNOSIS — F17210 Nicotine dependence, cigarettes, uncomplicated: Secondary | ICD-10-CM | POA: Insufficient documentation

## 2016-07-06 DIAGNOSIS — J449 Chronic obstructive pulmonary disease, unspecified: Secondary | ICD-10-CM | POA: Insufficient documentation

## 2016-07-06 DIAGNOSIS — Z5321 Procedure and treatment not carried out due to patient leaving prior to being seen by health care provider: Secondary | ICD-10-CM | POA: Diagnosis not present

## 2016-07-06 NOTE — ED Notes (Signed)
Pt called for room.  This RN made aware pt was escorted off of the premises.

## 2016-07-06 NOTE — ED Triage Notes (Signed)
Per EMS-was found on the ground [place] from a [place]-has been drinking-complaining of back pain-using profanity and making racial statements-uncooperative with triage process

## 2016-07-21 ENCOUNTER — Ambulatory Visit: Payer: Medicare Other | Admitting: Infectious Disease

## 2016-07-22 ENCOUNTER — Encounter (HOSPITAL_COMMUNITY): Payer: Self-pay | Admitting: Emergency Medicine

## 2016-07-22 ENCOUNTER — Emergency Department (HOSPITAL_COMMUNITY): Payer: Medicare Other

## 2016-07-22 ENCOUNTER — Emergency Department (HOSPITAL_COMMUNITY)
Admission: EM | Admit: 2016-07-22 | Discharge: 2016-07-22 | Discharge status: Against Medical Advice | Payer: Medicare Other | Attending: Emergency Medicine | Admitting: Emergency Medicine

## 2016-07-22 DIAGNOSIS — J45909 Unspecified asthma, uncomplicated: Secondary | ICD-10-CM | POA: Insufficient documentation

## 2016-07-22 DIAGNOSIS — F17210 Nicotine dependence, cigarettes, uncomplicated: Secondary | ICD-10-CM | POA: Insufficient documentation

## 2016-07-22 DIAGNOSIS — R1012 Left upper quadrant pain: Secondary | ICD-10-CM | POA: Insufficient documentation

## 2016-07-22 DIAGNOSIS — J449 Chronic obstructive pulmonary disease, unspecified: Secondary | ICD-10-CM | POA: Insufficient documentation

## 2016-07-22 DIAGNOSIS — W19XXXA Unspecified fall, initial encounter: Secondary | ICD-10-CM

## 2016-07-22 DIAGNOSIS — Y999 Unspecified external cause status: Secondary | ICD-10-CM | POA: Insufficient documentation

## 2016-07-22 DIAGNOSIS — I10 Essential (primary) hypertension: Secondary | ICD-10-CM | POA: Diagnosis not present

## 2016-07-22 DIAGNOSIS — Y9289 Other specified places as the place of occurrence of the external cause: Secondary | ICD-10-CM | POA: Diagnosis not present

## 2016-07-22 DIAGNOSIS — Y939 Activity, unspecified: Secondary | ICD-10-CM | POA: Insufficient documentation

## 2016-07-22 DIAGNOSIS — W06XXXA Fall from bed, initial encounter: Secondary | ICD-10-CM | POA: Insufficient documentation

## 2016-07-22 LAB — CBC WITH DIFFERENTIAL/PLATELET
Basophils Absolute: 0 10*3/uL (ref 0.0–0.1)
Basophils Relative: 0 %
Eosinophils Absolute: 0.2 10*3/uL (ref 0.0–0.7)
Eosinophils Relative: 3 %
HCT: 43.5 % (ref 39.0–52.0)
Hemoglobin: 14.1 g/dL (ref 13.0–17.0)
Lymphocytes Relative: 20 %
Lymphs Abs: 1 10*3/uL (ref 0.7–4.0)
MCH: 31.2 pg (ref 26.0–34.0)
MCHC: 32.4 g/dL (ref 30.0–36.0)
MCV: 96.2 fL (ref 78.0–100.0)
Monocytes Absolute: 0.6 10*3/uL (ref 0.1–1.0)
Monocytes Relative: 12 %
Neutro Abs: 3.3 10*3/uL (ref 1.7–7.7)
Neutrophils Relative %: 65 %
Platelets: 121 10*3/uL — ABNORMAL LOW (ref 150–400)
RBC: 4.52 MIL/uL (ref 4.22–5.81)
RDW: 13.1 % (ref 11.5–15.5)
WBC: 5.1 10*3/uL (ref 4.0–10.5)

## 2016-07-22 LAB — COMPREHENSIVE METABOLIC PANEL
ALT: 33 U/L (ref 17–63)
AST: 52 U/L — ABNORMAL HIGH (ref 15–41)
Albumin: 3.2 g/dL — ABNORMAL LOW (ref 3.5–5.0)
Alkaline Phosphatase: 53 U/L (ref 38–126)
Anion gap: 8 (ref 5–15)
BUN: 5 mg/dL — ABNORMAL LOW (ref 6–20)
CO2: 27 mmol/L (ref 22–32)
Calcium: 9.3 mg/dL (ref 8.9–10.3)
Chloride: 106 mmol/L (ref 101–111)
Creatinine, Ser: 0.92 mg/dL (ref 0.61–1.24)
GFR calc Af Amer: >60 mL/min (ref >60)
GFR calc non Af Amer: >60 mL/min (ref >60)
Glucose, Bld: 113 mg/dL — ABNORMAL HIGH (ref 65–99)
Potassium: 3.8 mmol/L (ref 3.5–5.1)
Sodium: 141 mmol/L (ref 135–145)
Total Bilirubin: 0.8 mg/dL (ref 0.3–1.2)
Total Protein: 8 g/dL (ref 6.5–8.1)

## 2016-07-22 LAB — I-STAT TROPONIN, ED: Troponin i, poc: 0.04 ng/mL (ref 0.00–0.08)

## 2016-07-22 LAB — URINALYSIS, ROUTINE W REFLEX MICROSCOPIC
Bilirubin Urine: NEGATIVE
Glucose, UA: NEGATIVE mg/dL
Hgb urine dipstick: NEGATIVE
Ketones, ur: NEGATIVE mg/dL
Leukocytes, UA: NEGATIVE
Nitrite: NEGATIVE
Protein, ur: NEGATIVE mg/dL
Specific Gravity, Urine: 1.012 (ref 1.005–1.030)
pH: 8 (ref 5.0–8.0)

## 2016-07-22 LAB — ETHANOL: Alcohol, Ethyl (B): <5 mg/dL (ref <5)

## 2016-07-22 LAB — LIPASE, BLOOD: Lipase: 23 U/L (ref 11–51)

## 2016-07-22 NOTE — ED Notes (Signed)
Pt ambulated on his room with slow steady gait.

## 2016-07-22 NOTE — ED Triage Notes (Signed)
Pt BIB EMS from transitional housing. [facility]. Awoke about 2am in dark room, had mechanical fall. No complaints at this time. EMS called by facility. Pt at baseline for his normal presentation. Hx of HIV, HepC, Substance abuse, depression. BP 145/88, P84, CBG151.

## 2016-07-22 NOTE — ED Notes (Signed)
I found the room empty with gown  bp cuff and  bp cuff lying on the bed no one saw pt leave

## 2016-07-22 NOTE — Discharge Instructions (Signed)
Please follow with your primary care doctor in the next 2 days for a check-up. They must obtain records for further management.  ° °Do not hesitate to return to the Emergency Department for any new, worsening or concerning symptoms.  ° °
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
## 2016-08-02 ENCOUNTER — Emergency Department (HOSPITAL_COMMUNITY)
Admission: EM | Admit: 2016-08-02 | Discharge: 2016-08-02 | Disposition: A | Discharge status: Home / Self Care | Payer: Medicare Other | Attending: Dermatology | Admitting: Dermatology

## 2016-08-02 ENCOUNTER — Encounter (HOSPITAL_COMMUNITY): Payer: Self-pay | Admitting: Emergency Medicine

## 2016-08-02 DIAGNOSIS — Z5321 Procedure and treatment not carried out due to patient leaving prior to being seen by health care provider: Secondary | ICD-10-CM | POA: Insufficient documentation

## 2016-08-02 DIAGNOSIS — F10120 Alcohol abuse with intoxication, uncomplicated: Secondary | ICD-10-CM | POA: Diagnosis not present

## 2016-08-02 NOTE — ED Triage Notes (Signed)
Per [facility] patient was laying on side of street and was picked up for intoxication.

## 2016-09-07 ENCOUNTER — Other Ambulatory Visit: Payer: Self-pay | Admitting: *Deleted

## 2016-09-07 DIAGNOSIS — B20 Human immunodeficiency virus [HIV] disease: Secondary | ICD-10-CM

## 2016-09-07 MED ORDER — ABACAVIR-DOLUTEGRAVIR-LAMIVUD 600-50-300 MG PO TABS: 1.0000 | ORAL_TABLET | Freq: Every day | ORAL | 5 refills | Status: DC

## 2016-09-12 ENCOUNTER — Emergency Department (HOSPITAL_COMMUNITY)
Admission: EM | Admit: 2016-09-12 | Discharge: 2016-09-13 | Disposition: A | Discharge status: Home / Self Care | Payer: Medicare Other | Attending: Emergency Medicine | Admitting: Emergency Medicine

## 2016-09-12 ENCOUNTER — Emergency Department (HOSPITAL_COMMUNITY): Payer: Medicare Other

## 2016-09-12 ENCOUNTER — Encounter (HOSPITAL_COMMUNITY): Payer: Self-pay | Admitting: Family Medicine

## 2016-09-12 DIAGNOSIS — Y939 Activity, unspecified: Secondary | ICD-10-CM | POA: Diagnosis not present

## 2016-09-12 DIAGNOSIS — I10 Essential (primary) hypertension: Secondary | ICD-10-CM | POA: Diagnosis not present

## 2016-09-12 DIAGNOSIS — J449 Chronic obstructive pulmonary disease, unspecified: Secondary | ICD-10-CM | POA: Diagnosis not present

## 2016-09-12 DIAGNOSIS — Y92480 Sidewalk as the place of occurrence of the external cause: Secondary | ICD-10-CM | POA: Insufficient documentation

## 2016-09-12 DIAGNOSIS — F17210 Nicotine dependence, cigarettes, uncomplicated: Secondary | ICD-10-CM | POA: Insufficient documentation

## 2016-09-12 DIAGNOSIS — F10120 Alcohol abuse with intoxication, uncomplicated: Secondary | ICD-10-CM | POA: Diagnosis not present

## 2016-09-12 DIAGNOSIS — Y999 Unspecified external cause status: Secondary | ICD-10-CM | POA: Insufficient documentation

## 2016-09-12 DIAGNOSIS — W19XXXA Unspecified fall, initial encounter: Secondary | ICD-10-CM | POA: Diagnosis not present

## 2016-09-12 DIAGNOSIS — Z79899 Other long term (current) drug therapy: Secondary | ICD-10-CM | POA: Diagnosis not present

## 2016-09-12 DIAGNOSIS — R4182 Altered mental status, unspecified: Secondary | ICD-10-CM | POA: Diagnosis not present

## 2016-09-12 DIAGNOSIS — F10920 Alcohol use, unspecified with intoxication, uncomplicated: Secondary | ICD-10-CM

## 2016-09-12 LAB — COMPREHENSIVE METABOLIC PANEL
ALT: 42 U/L (ref 17–63)
AST: 69 U/L — ABNORMAL HIGH (ref 15–41)
Albumin: 3.6 g/dL (ref 3.5–5.0)
Alkaline Phosphatase: 55 U/L (ref 38–126)
Anion gap: 8 (ref 5–15)
BUN: 10 mg/dL (ref 6–20)
CO2: 22 mmol/L (ref 22–32)
Calcium: 9.1 mg/dL (ref 8.9–10.3)
Chloride: 112 mmol/L — ABNORMAL HIGH (ref 101–111)
Creatinine, Ser: 0.99 mg/dL (ref 0.61–1.24)
GFR calc Af Amer: >60 mL/min (ref >60)
GFR calc non Af Amer: >60 mL/min (ref >60)
Glucose, Bld: 90 mg/dL (ref 65–99)
Potassium: 4.3 mmol/L (ref 3.5–5.1)
Sodium: 142 mmol/L (ref 135–145)
Total Bilirubin: 0.6 mg/dL (ref 0.3–1.2)
Total Protein: 8.8 g/dL — ABNORMAL HIGH (ref 6.5–8.1)

## 2016-09-12 LAB — CBC WITH DIFFERENTIAL/PLATELET
Basophils Absolute: 0 10*3/uL (ref 0.0–0.1)
Basophils Relative: 0 %
Eosinophils Absolute: 0.1 10*3/uL (ref 0.0–0.7)
Eosinophils Relative: 2 %
HCT: 41.8 % (ref 39.0–52.0)
Hemoglobin: 14.1 g/dL (ref 13.0–17.0)
Lymphocytes Relative: 31 %
Lymphs Abs: 1.5 10*3/uL (ref 0.7–4.0)
MCH: 32.4 pg (ref 26.0–34.0)
MCHC: 33.7 g/dL (ref 30.0–36.0)
MCV: 96.1 fL (ref 78.0–100.0)
Monocytes Absolute: 0.4 10*3/uL (ref 0.1–1.0)
Monocytes Relative: 8 %
Neutro Abs: 2.9 10*3/uL (ref 1.7–7.7)
Neutrophils Relative %: 59 %
Platelets: 121 10*3/uL — ABNORMAL LOW (ref 150–400)
RBC: 4.35 MIL/uL (ref 4.22–5.81)
RDW: 14.9 % (ref 11.5–15.5)
WBC: 5 10*3/uL (ref 4.0–10.5)

## 2016-09-12 LAB — ETHANOL: Alcohol, Ethyl (B): 262 mg/dL — ABNORMAL HIGH (ref <5)

## 2016-09-12 LAB — RAPID URINE DRUG SCREEN, HOSP PERFORMED
Amphetamines: NOT DETECTED
Barbiturates: NOT DETECTED
Benzodiazepines: POSITIVE — AB
Cocaine: NOT DETECTED
Opiates: NOT DETECTED
Tetrahydrocannabinol: NOT DETECTED

## 2016-09-12 MED ORDER — ZIPRASIDONE MESYLATE 20 MG IM SOLR
10.0000 mg | Freq: Once | INTRAMUSCULAR | Status: AC
  Administered 2016-09-12: 10 mg via INTRAMUSCULAR
  Filled 2016-09-12: qty 20

## 2016-09-12 MED ORDER — STERILE WATER FOR INJECTION IJ SOLN
INTRAMUSCULAR | Status: AC
  Administered 2016-09-12: 1.2 mL
  Filled 2016-09-12: qty 10

## 2016-09-12 NOTE — ED Notes (Signed)
Bed: ZO10 Expected date:  Expected time:  Means of arrival:  Comments: [facility] EMS

## 2016-09-12 NOTE — ED Notes (Signed)
Bed alarm has been placed underneath patient and he has been readjusted on stretcher.

## 2016-09-12 NOTE — ED Notes (Signed)
Bed: WHALB Expected date:  Expected time:  Means of arrival:  Comments: 

## 2016-09-12 NOTE — ED Notes (Signed)
Patient has been moved to room 10, placed on a cardiac monitor, continuous pulse ox.

## 2016-09-12 NOTE — ED Notes (Signed)
Pt continues to get out of bed.  Pushing against staff to leave department.  Very unsteady on his feet and confused.  Pt redirected multiple times to stay in bed.  Not following directions which is causing high risk for injury.  Notified MD.  Medication order given.

## 2016-09-12 NOTE — ED Notes (Signed)
EMS had left the room, at nurses station giving report. Writer and two [facility] personal  heard a large boom. When entering the room, pt was found on the floor on his back with a urinal in his hand, urine all around his upper body. Pt is alert, oriented, but intoxicated. No signs of injury noted and he has no complaints of pain. Pt was assisted upright and on to stretcher.
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
## 2016-09-13 NOTE — ED Notes (Signed)
Pt got up to to use the urinal and requested some Ativan. Informed patient he would be discharged once he could ambulate out of the facility with no assistance and he was not getting additional medication or food. Pt reported he wanted to leave, then he attempted to lay back on stretcher. [facility] security was at bedside during the whole incident and called off duty Technical sales engineer for assistance. Pt was provided another pair of scrubs.

## 2016-10-05 ENCOUNTER — Other Ambulatory Visit: Payer: Self-pay

## 2016-10-10 ENCOUNTER — Other Ambulatory Visit: Payer: Self-pay

## 2016-10-11 ENCOUNTER — Telehealth: Payer: Self-pay | Admitting: *Deleted

## 2016-10-11 NOTE — Telephone Encounter (Signed)
Patient called stating he can not refill his medication. Advised he was overdue for an office visit and offered him an appt to come in and see pharmacy. He stated "yeah, I've been busy". He is going to the [facility] today for an appt. Advised him to keep his upcoming appt with Dr. Daiva EvesVan Dam on 10/19/16. He said, "ok I will be there". Multiple cancellations and patient has not been seen in this clinic in 2018. Wendall MolaJacqueline Cockerham

## 2016-10-19 ENCOUNTER — Ambulatory Visit: Payer: Self-pay | Admitting: Infectious Disease

## 2016-10-25 ENCOUNTER — Encounter (HOSPITAL_COMMUNITY): Payer: Self-pay | Admitting: Emergency Medicine

## 2016-10-25 ENCOUNTER — Emergency Department (HOSPITAL_COMMUNITY)
Admission: EM | Admit: 2016-10-25 | Discharge: 2016-10-27 | Disposition: A | Discharge status: Home / Self Care | Payer: Medicare Other | Attending: Emergency Medicine | Admitting: Emergency Medicine

## 2016-10-25 DIAGNOSIS — Z79899 Other long term (current) drug therapy: Secondary | ICD-10-CM | POA: Diagnosis not present

## 2016-10-25 DIAGNOSIS — F17210 Nicotine dependence, cigarettes, uncomplicated: Secondary | ICD-10-CM | POA: Diagnosis not present

## 2016-10-25 DIAGNOSIS — J449 Chronic obstructive pulmonary disease, unspecified: Secondary | ICD-10-CM | POA: Insufficient documentation

## 2016-10-25 DIAGNOSIS — F1920 Other psychoactive substance dependence, uncomplicated: Secondary | ICD-10-CM

## 2016-10-25 DIAGNOSIS — F259 Schizoaffective disorder, unspecified: Secondary | ICD-10-CM | POA: Diagnosis present

## 2016-10-25 DIAGNOSIS — J45909 Unspecified asthma, uncomplicated: Secondary | ICD-10-CM | POA: Insufficient documentation

## 2016-10-25 DIAGNOSIS — F250 Schizoaffective disorder, bipolar type: Secondary | ICD-10-CM | POA: Insufficient documentation

## 2016-10-25 DIAGNOSIS — F1410 Cocaine abuse, uncomplicated: Secondary | ICD-10-CM | POA: Diagnosis not present

## 2016-10-25 DIAGNOSIS — F1290 Cannabis use, unspecified, uncomplicated: Secondary | ICD-10-CM | POA: Diagnosis not present

## 2016-10-25 DIAGNOSIS — F1014 Alcohol abuse with alcohol-induced mood disorder: Secondary | ICD-10-CM | POA: Diagnosis not present

## 2016-10-25 DIAGNOSIS — I10 Essential (primary) hypertension: Secondary | ICD-10-CM | POA: Insufficient documentation

## 2016-10-25 DIAGNOSIS — Z21 Asymptomatic human immunodeficiency virus [HIV] infection status: Secondary | ICD-10-CM | POA: Diagnosis not present

## 2016-10-25 DIAGNOSIS — R45851 Suicidal ideations: Secondary | ICD-10-CM | POA: Diagnosis present

## 2016-10-25 LAB — RAPID URINE DRUG SCREEN, HOSP PERFORMED
Amphetamines: NOT DETECTED
Barbiturates: NOT DETECTED
Benzodiazepines: POSITIVE — AB
Cocaine: POSITIVE — AB
Opiates: NOT DETECTED
Tetrahydrocannabinol: POSITIVE — AB

## 2016-10-25 LAB — COMPREHENSIVE METABOLIC PANEL
ALT: 58 U/L (ref 17–63)
AST: 101 U/L — ABNORMAL HIGH (ref 15–41)
Albumin: 3.5 g/dL (ref 3.5–5.0)
Alkaline Phosphatase: 67 U/L (ref 38–126)
Anion gap: 8 (ref 5–15)
BUN: 17 mg/dL (ref 6–20)
CO2: 25 mmol/L (ref 22–32)
Calcium: 8.7 mg/dL — ABNORMAL LOW (ref 8.9–10.3)
Chloride: 99 mmol/L — ABNORMAL LOW (ref 101–111)
Creatinine, Ser: 1.13 mg/dL (ref 0.61–1.24)
GFR calc Af Amer: >60 mL/min (ref >60)
GFR calc non Af Amer: >60 mL/min (ref >60)
Glucose, Bld: 97 mg/dL (ref 65–99)
Potassium: 3.4 mmol/L — ABNORMAL LOW (ref 3.5–5.1)
Sodium: 132 mmol/L — ABNORMAL LOW (ref 135–145)
Total Bilirubin: 0.8 mg/dL (ref 0.3–1.2)
Total Protein: 8.5 g/dL — ABNORMAL HIGH (ref 6.5–8.1)

## 2016-10-25 LAB — CBC
HCT: 41.8 % (ref 39.0–52.0)
Hemoglobin: 14.5 g/dL (ref 13.0–17.0)
MCH: 33 pg (ref 26.0–34.0)
MCHC: 34.7 g/dL (ref 30.0–36.0)
MCV: 95 fL (ref 78.0–100.0)
Platelets: 117 10*3/uL — ABNORMAL LOW (ref 150–400)
RBC: 4.4 MIL/uL (ref 4.22–5.81)
RDW: 12.8 % (ref 11.5–15.5)
WBC: 7.6 10*3/uL (ref 4.0–10.5)

## 2016-10-25 LAB — AMMONIA: Ammonia: 28 umol/L (ref 9–35)

## 2016-10-25 LAB — ETHANOL: Alcohol, Ethyl (B): 300 mg/dL — ABNORMAL HIGH (ref <5)

## 2016-10-25 MED ORDER — VITAMIN B-1 100 MG PO TABS
100.0000 mg | ORAL_TABLET | Freq: Every day | ORAL | Status: DC
  Administered 2016-10-26 – 2016-10-27 (×3): 100 mg via ORAL
  Filled 2016-10-25 (×3): qty 1

## 2016-10-25 MED ORDER — LORAZEPAM 1 MG PO TABS
0.0000 mg | ORAL_TABLET | Freq: Two times a day (BID) | ORAL | Status: DC
Start: 2016-10-28 — End: 2016-10-27

## 2016-10-25 MED ORDER — DIPHENHYDRAMINE HCL 50 MG/ML IJ SOLN: 50.0000 mg | Freq: Once | INTRAMUSCULAR | Status: DC

## 2016-10-25 MED ORDER — SERTRALINE HCL 50 MG PO TABS
100.0000 mg | ORAL_TABLET | Freq: Every day | ORAL | Status: DC
  Administered 2016-10-26 – 2016-10-27 (×3): 100 mg via ORAL
  Filled 2016-10-25 (×3): qty 2

## 2016-10-25 MED ORDER — LORAZEPAM 2 MG/ML IJ SOLN: 1.0000 mg | Freq: Once | INTRAMUSCULAR | Status: DC

## 2016-10-25 MED ORDER — ABACAVIR-DOLUTEGRAVIR-LAMIVUD 600-50-300 MG PO TABS
1.0000 | ORAL_TABLET | Freq: Every day | ORAL | Status: DC
  Administered 2016-10-26 – 2016-10-27 (×2): 1 via ORAL
  Filled 2016-10-25 (×2): qty 1

## 2016-10-25 MED ORDER — LORAZEPAM 2 MG/ML IJ SOLN
0.0000 mg | Freq: Four times a day (QID) | INTRAMUSCULAR | Status: DC
Start: 2016-10-25 — End: 2016-10-27

## 2016-10-25 MED ORDER — RISPERIDONE 2 MG PO TABS
2.0000 mg | ORAL_TABLET | Freq: Every day | ORAL | Status: DC
  Administered 2016-10-26 (×2): 2 mg via ORAL
  Filled 2016-10-25 (×2): qty 1

## 2016-10-25 MED ORDER — LORAZEPAM 2 MG/ML IJ SOLN
2.0000 mg | Freq: Once | INTRAMUSCULAR | Status: AC
  Administered 2016-10-25: 2 mg via INTRAMUSCULAR
  Filled 2016-10-25: qty 1

## 2016-10-25 MED ORDER — LORAZEPAM 2 MG/ML IJ SOLN: 0.0000 mg | Freq: Two times a day (BID) | INTRAMUSCULAR | Status: DC

## 2016-10-25 MED ORDER — GABAPENTIN 300 MG PO CAPS
900.0000 mg | ORAL_CAPSULE | Freq: Every day | ORAL | Status: DC
  Administered 2016-10-26 (×2): 900 mg via ORAL
  Filled 2016-10-25 (×2): qty 3

## 2016-10-25 MED ORDER — THIAMINE HCL 100 MG/ML IJ SOLN: 100.0000 mg | Freq: Every day | INTRAMUSCULAR | Status: DC

## 2016-10-25 MED ORDER — ZIPRASIDONE MESYLATE 20 MG IM SOLR: 10.0000 mg | Freq: Once | INTRAMUSCULAR | Status: DC

## 2016-10-25 MED ORDER — LORAZEPAM 1 MG PO TABS
0.0000 mg | ORAL_TABLET | Freq: Four times a day (QID) | ORAL | Status: DC
  Administered 2016-10-26 (×3): 1 mg via ORAL
  Filled 2016-10-25: qty 2
  Filled 2016-10-25 (×3): qty 1

## 2016-10-25 NOTE — ED Triage Notes (Signed)
Patient found supine on his girlfriend's front porch.  EMS reports patient smells of ETOH and is well known to the ED.  Patient reported he wanted to go to the hospital and gave numerous complaints to EMS, but finally settled on suicidal ideation.  No plan verbalized.  No homicidal ideation or AVH.

## 2016-10-25 NOTE — ED Notes (Signed)
Belongings placed in locker 31. Johnathan JarvisLocker will not lock.
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
## 2016-10-26 DIAGNOSIS — F1920 Other psychoactive substance dependence, uncomplicated: Secondary | ICD-10-CM

## 2016-10-26 DIAGNOSIS — F1410 Cocaine abuse, uncomplicated: Secondary | ICD-10-CM | POA: Diagnosis present

## 2016-10-26 MED ORDER — GABAPENTIN 300 MG PO CAPS
300.0000 mg | ORAL_CAPSULE | Freq: Three times a day (TID) | ORAL | Status: DC
  Administered 2016-10-26 – 2016-10-27 (×4): 300 mg via ORAL
  Filled 2016-10-26 (×4): qty 1

## 2016-10-26 MED ORDER — IBUPROFEN 200 MG PO TABS
600.0000 mg | ORAL_TABLET | Freq: Four times a day (QID) | ORAL | Status: DC | PRN
  Administered 2016-10-26: 600 mg via ORAL
  Filled 2016-10-26: qty 3
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
## 2016-10-26 NOTE — Progress Notes (Signed)
  D: Pt has been lying in bed for most of writers shift. Pt respirations even and unlabored, main complaint is R sided abdominal pain of unknown origin. Pt asks for snacks, adheres to medication regimen.   A: Pt provided with medications per providers orders. Pt's labs and vitals were monitored throughout the night. Pt given a 1:1 about emotional and mental status. Pt supported and encouraged to express concerns and questions. Environmental safety assessed, no threats noted.   R: Pt remains safe with hourly rounding. Pt currently denies SI/HI and A/V hallucinations. Pt verbally agrees to seek staff if SI/HI or A/VH occurs and to consult with staff before acting on any harmful thoughts. Will continue POC.

## 2016-10-26 NOTE — ED Notes (Signed)
Pt oriented to room and unit.  Patient is pleasant and cooperative.  He is complaining of pain in his right rib area.  He doesn't appear to be in any distress due to withdrawals.  Patient contracts for safety.  15 minute checks and video monitoring in place.

## 2016-10-27 ENCOUNTER — Emergency Department (HOSPITAL_COMMUNITY)
Admission: EM | Admit: 2016-10-27 | Discharge: 2016-10-27 | Disposition: A | Discharge status: Home / Self Care | Payer: Medicare Other | Source: Home / Self Care

## 2016-10-27 ENCOUNTER — Encounter (HOSPITAL_COMMUNITY): Payer: Self-pay | Admitting: Emergency Medicine

## 2016-10-27 DIAGNOSIS — F1290 Cannabis use, unspecified, uncomplicated: Secondary | ICD-10-CM

## 2016-10-27 DIAGNOSIS — F1014 Alcohol abuse with alcohol-induced mood disorder: Secondary | ICD-10-CM | POA: Diagnosis not present

## 2016-10-27 DIAGNOSIS — F250 Schizoaffective disorder, bipolar type: Secondary | ICD-10-CM | POA: Diagnosis not present

## 2016-10-27 DIAGNOSIS — F17210 Nicotine dependence, cigarettes, uncomplicated: Secondary | ICD-10-CM | POA: Diagnosis not present

## 2016-10-27 DIAGNOSIS — F1410 Cocaine abuse, uncomplicated: Secondary | ICD-10-CM | POA: Diagnosis not present

## 2016-10-27 DIAGNOSIS — Z5321 Procedure and treatment not carried out due to patient leaving prior to being seen by health care provider: Secondary | ICD-10-CM | POA: Insufficient documentation

## 2016-10-27 DIAGNOSIS — R0781 Pleurodynia: Secondary | ICD-10-CM

## 2016-10-27 NOTE — ED Notes (Signed)
Called patient in to be bedded and no answer.
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
## 2016-10-27 NOTE — ED Notes (Signed)
Pt discharged ambulatory with resources and discharge instructions.  Bus pass was given and all belongings were returned. 

## 2016-10-27 NOTE — Discharge Instructions (Signed)
For your ongoing behavioral health needs, you are advised to continue treatment with there [facility]:       [facility]      [address]      [place], [place] [address]      [phone]  To help you maintain a sober lifestyle, a substance abuse treatment program may be beneficial to you.  Contact one of these local providers at your earliest opportunity to ask about enrolling in their program:       [facility]      [address]      [place], [place] [address]      [phone]      New patients are seen at the walk-in clinic every Tuesday from 9:00 am - 12:00 pm.       [facility]      [address]      [place], [place] [address]      [phone]

## 2016-10-27 NOTE — ED Notes (Signed)
Called patient to be bedded and no answer. 

## 2016-10-27 NOTE — ED Notes (Signed)
Called patient to be bedded no answer.

## 2016-10-27 NOTE — ED Triage Notes (Signed)
Patient fell and is complaining of right rib pain. Patient has no deformities and is tender to touch. Patient has ETOH on board.

## 2016-10-27 NOTE — [facility] Suicide Risk Assessment (Signed)
Suicide Risk Assessment  Discharge Assessment   [facility]   Principal Problem: Alcohol abuse with alcohol-induced mood disorder [facility](HCC) Discharge Diagnoses:  Patient Active Problem List   Diagnosis Date Noted  . Cocaine abuse [F14.10] 10/26/2016    Priority: High  . Alcohol abuse with alcohol-induced mood disorder (HCC) [F10.14] 07/05/2015    Priority: High  . Schizoaffective disorder (HCC) [F25.9] 04/26/2013    Priority: High  . 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 Polysubstance (excluding opioids) dependence, daily use (HCC) [F19.20] 10/26/2016  . Elevated troponin [R74.8] 04/10/2016  . Chest pain [R07.9] 04/09/2016  . Alcohol intoxication (HCC) [F10.929] 04/09/2016  . Drug abuse [F19.10] 04/09/2016  . AIDS (HCC) [B20] 01/20/2015  . Chronic hepatitis C without hepatic coma (HCC) [B18.2] 09/29/2014  . Sciatica [M54.30] 09/29/2014  . Major depression, recurrent (HCC) [F33.9] 09/29/2014  . Erectile dysfunction [N52.9] 09/21/2011  . MRSA bacteremia [R78.81] 09/21/2011  . 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 Human immunodeficiency virus (HIV) disease (HCC) [B20] 05/30/2006  . Chronic hepatitis C virus infection (HCC) [B18.2] 05/30/2006  . 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 TOBACCO ABUSE [F17.200] 05/30/2006  . HEADACHE, TENSION [G44.209] 05/30/2006  . Essential hypertension [I10] 05/30/2006  . EMPHYSEMA [J43.8] 05/30/2006  . Asthma [J45.909] 05/30/2006    Total Time spent with patient: 45 minutes  Musculoskeletal: Strength & Muscle Tone: within normal limits Gait & Station: normal Patient leans: N/A  Psychiatric Specialty Exam: Physical Exam  Constitutional: He is oriented to person, place, and time. He appears well-developed and well-nourished.  HENT:  Head: Normocephalic.  Neck: Normal range of motion.  Respiratory: Effort normal.  Musculoskeletal: Normal range  of motion.  Neurological: He is alert and oriented to person, place, and time.  Psychiatric: He has a normal mood and affect. His speech is normal and behavior is normal. Judgment and thought content normal. Cognition and memory are normal.    Review of Systems  Psychiatric/Behavioral: Positive for substance abuse.  All other systems reviewed and are negative.   Blood pressure 121/87, pulse 70, temperature 99.1 F (37.3 C), temperature source Oral, resp. rate 16, SpO2 99 %.There is no height or weight on file to calculate BMI.  General Appearance: Casual  Eye Contact:  Good  Speech:  Normal Rate  Volume:  Normal  Mood:  Euthymic  Affect:  Congruent  Thought Process:  Coherent and Descriptions of Associations: Intact  Orientation:  Full (Time, Place, and Person)  Thought Content:  WDL and Logical  Suicidal Thoughts:  No  Homicidal Thoughts:  No  Memory:  Immediate;   Good Recent;   Fair Remote;   Good  Judgement:  Fair  Insight:  Good  Psychomotor Activity:  Normal  Concentration:  Concentration: Good and Attention Span: Good  Recall:  [employer]  Fund of Knowledge:  Fair  Language:  Good  Akathisia:  No  Handed:  Right  AIMS (if indicated):     Assets:  Intimacy Leisure Time Physical Health Resilience Social Support  ADL's:  Intact  Cognition:  WNL  Sleep:      Mental Status Per Nursing Assessment::   On Admission:   alcohol intoxication and substance abuse  Demographic Factors:  Male  Loss Factors: NA  Historical Factors: NA  Risk Reduction Factors:   Sense of responsibility to family, Living with another person, especially a relative, Positive social support and Positive therapeutic relationship  Continued Clinical Symptoms:  None  Cognitive Features That Contribute To Risk:  None    Suicide Risk:  Minimal: No identifiable suicidal ideation.  Patients presenting with no risk factors but with morbid ruminations; may be classified as minimal risk based on  the severity of the depressive symptoms    Plan Of Care/Follow-up recommendations:  Activity:  as tolerated Diet:  heart healthy diet  LORD, JAMISON, NP 10/27/2016, 9:24 AM

## 2016-10-27 NOTE — BH Assessment (Signed)
[facility] Assessment Progress Note  Per Thedore MinsMojeed Akintayo, MD, this pt does not require psychiatric hospitalization at this time.  [facility].  He is also to be provided with substance abuse treatment resources.  These have been included in pt's discharge instructions.  Pt's nurse, Kendal Hymendie, has been notified.  Doylene Canninghomas Katanya Schlie, MA Triage Specialist [phone]

## 2016-10-28 ENCOUNTER — Other Ambulatory Visit: Payer: Medicare Other

## 2016-10-28 ENCOUNTER — Other Ambulatory Visit: Payer: Self-pay | Admitting: Infectious Disease

## 2016-10-28 DIAGNOSIS — B20 Human immunodeficiency virus [HIV] disease: Secondary | ICD-10-CM

## 2016-10-31 ENCOUNTER — Other Ambulatory Visit: Payer: Medicare Other

## 2016-11-03 ENCOUNTER — Emergency Department (HOSPITAL_COMMUNITY): Payer: Medicare Other

## 2016-11-03 ENCOUNTER — Emergency Department (HOSPITAL_COMMUNITY)
Admission: EM | Admit: 2016-11-03 | Discharge: 2016-11-03 | Disposition: A | Discharge status: Home / Self Care | Payer: Medicare Other | Attending: Emergency Medicine | Admitting: Emergency Medicine

## 2016-11-03 ENCOUNTER — Encounter (HOSPITAL_COMMUNITY): Payer: Self-pay | Admitting: Emergency Medicine

## 2016-11-03 DIAGNOSIS — W19XXXA Unspecified fall, initial encounter: Secondary | ICD-10-CM | POA: Insufficient documentation

## 2016-11-03 DIAGNOSIS — F10120 Alcohol abuse with intoxication, uncomplicated: Secondary | ICD-10-CM | POA: Insufficient documentation

## 2016-11-03 DIAGNOSIS — I129 Hypertensive chronic kidney disease with stage 1 through stage 4 chronic kidney disease, or unspecified chronic kidney disease: Secondary | ICD-10-CM | POA: Diagnosis not present

## 2016-11-03 DIAGNOSIS — N189 Chronic kidney disease, unspecified: Secondary | ICD-10-CM | POA: Insufficient documentation

## 2016-11-03 DIAGNOSIS — Y999 Unspecified external cause status: Secondary | ICD-10-CM | POA: Insufficient documentation

## 2016-11-03 DIAGNOSIS — J45909 Unspecified asthma, uncomplicated: Secondary | ICD-10-CM | POA: Insufficient documentation

## 2016-11-03 DIAGNOSIS — F10929 Alcohol use, unspecified with intoxication, unspecified: Secondary | ICD-10-CM | POA: Diagnosis present

## 2016-11-03 DIAGNOSIS — S2231XA Fracture of one rib, right side, initial encounter for closed fracture: Secondary | ICD-10-CM | POA: Diagnosis not present

## 2016-11-03 DIAGNOSIS — M7918 Myalgia, other site: Secondary | ICD-10-CM

## 2016-11-03 DIAGNOSIS — M791 Myalgia: Secondary | ICD-10-CM | POA: Insufficient documentation

## 2016-11-03 DIAGNOSIS — B20 Human immunodeficiency virus [HIV] disease: Secondary | ICD-10-CM | POA: Diagnosis not present

## 2016-11-03 DIAGNOSIS — Y939 Activity, unspecified: Secondary | ICD-10-CM | POA: Diagnosis not present

## 2016-11-03 DIAGNOSIS — Y929 Unspecified place or not applicable: Secondary | ICD-10-CM | POA: Insufficient documentation

## 2016-11-03 DIAGNOSIS — Z79899 Other long term (current) drug therapy: Secondary | ICD-10-CM | POA: Insufficient documentation

## 2016-11-03 DIAGNOSIS — F10920 Alcohol use, unspecified with intoxication, uncomplicated: Secondary | ICD-10-CM

## 2016-11-03 DIAGNOSIS — F17210 Nicotine dependence, cigarettes, uncomplicated: Secondary | ICD-10-CM | POA: Diagnosis not present

## 2016-11-03 NOTE — Discharge Instructions (Addendum)
Please read and follow all provided instructions.  Your diagnoses today include:  1. Alcoholic intoxication without complication (HCC)   2. Musculoskeletal pain     Tests performed today include: Vital signs. See below for your results today.   Medications prescribed:  Take as prescribed   Home care instructions:  Follow any educational materials contained in this packet.  Follow-up instructions: Please follow-up with your primary care provider for further evaluation of symptoms and treatment   Return instructions:  Please return to the Emergency Department if you do not get better, if you get worse, or new symptoms OR  - Fever (temperature greater than 101.82F)  - Bleeding that does not stop with holding pressure to the area    -Severe pain (please note that you may be more sore the day after your accident)  - Chest Pain  - Difficulty breathing  - Severe nausea or vomiting  - Inability to tolerate food and liquids  - Passing out  - Skin becoming red around your wounds  - Change in mental status (confusion or lethargy)  - New numbness or weakness    Please return if you have any other emergent concerns.  Additional Information:  Your vital signs today were: BP 128/64 (BP Location: Left Arm)    Pulse 94    Temp 98.5 F (36.9 C) (Oral)    Resp 18    SpO2 98%  If your blood pressure (BP) was elevated above 135/85 this visit, please have this repeated by your doctor within one month. ---------------

## 2016-11-03 NOTE — ED Triage Notes (Signed)
Per EMS, pt was found by neighbors, appearing intoxicated. Pt states he smoked marijuana this morning and states he had 8 40oz beers over the past 12 hours.   BP 128/64 HR 94 CBG 88

## 2016-11-03 NOTE — ED Notes (Signed)
Patient unable to ambulate at this time, will discharge when ambulatory.

## 2016-11-03 NOTE — ED Notes (Signed)
CBG 128 
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
## 2016-11-03 NOTE — ED Notes (Addendum)
Pt still unable to ambulate with steady gait. Will discharge when ambulatory.

## 2016-11-10 ENCOUNTER — Emergency Department (HOSPITAL_COMMUNITY)
Admission: EM | Admit: 2016-11-10 | Discharge: 2016-11-10 | Disposition: A | Discharge status: Home / Self Care | Payer: Medicare Other | Attending: Emergency Medicine | Admitting: Emergency Medicine

## 2016-11-10 DIAGNOSIS — F10920 Alcohol use, unspecified with intoxication, uncomplicated: Secondary | ICD-10-CM | POA: Diagnosis not present

## 2016-11-10 DIAGNOSIS — F918 Other conduct disorders: Secondary | ICD-10-CM | POA: Insufficient documentation

## 2016-11-10 NOTE — ED Notes (Addendum)
Patient has been to triage x 2 and keeps walking out. Patient is loud and disruptive in lobby. [employer] in the lobby.

## 2016-11-10 NOTE — ED Notes (Signed)
Pt was walked to room for triage and pt refused to stop and continued walking into lobby.  Refused triage at this time.

## 2016-11-10 NOTE — ED Triage Notes (Signed)
Pt brought in by EMS for alcohol intoxication. Pt states he has bronchitis and would like to be evaluated.

## 2016-11-10 NOTE — ED Triage Notes (Signed)
Pt was loud and cursing in the waiting room while trying to triage patient. Security notified to escort pt off premises. Pt left on his own before security arrived.

## 2016-11-15 ENCOUNTER — Encounter (HOSPITAL_COMMUNITY): Payer: Self-pay | Admitting: *Deleted

## 2016-11-15 ENCOUNTER — Emergency Department (HOSPITAL_COMMUNITY): Payer: Medicare Other

## 2016-11-15 ENCOUNTER — Observation Stay (HOSPITAL_COMMUNITY)
Admission: EM | Admit: 2016-11-15 | Discharge: 2016-11-17 | Disposition: A | Discharge status: Home / Self Care | Payer: Medicare Other | Attending: Internal Medicine | Admitting: Internal Medicine

## 2016-11-15 DIAGNOSIS — F319 Bipolar disorder, unspecified: Secondary | ICD-10-CM | POA: Insufficient documentation

## 2016-11-15 DIAGNOSIS — F10920 Alcohol use, unspecified with intoxication, uncomplicated: Secondary | ICD-10-CM

## 2016-11-15 DIAGNOSIS — T5191XA Toxic effect of unspecified alcohol, accidental (unintentional), initial encounter: Secondary | ICD-10-CM | POA: Diagnosis not present

## 2016-11-15 DIAGNOSIS — G44209 Tension-type headache, unspecified, not intractable: Secondary | ICD-10-CM | POA: Diagnosis not present

## 2016-11-15 DIAGNOSIS — Z8249 Family history of ischemic heart disease and other diseases of the circulatory system: Secondary | ICD-10-CM | POA: Insufficient documentation

## 2016-11-15 DIAGNOSIS — J189 Pneumonia, unspecified organism: Secondary | ICD-10-CM | POA: Diagnosis present

## 2016-11-15 DIAGNOSIS — G5790 Unspecified mononeuropathy of unspecified lower limb: Secondary | ICD-10-CM | POA: Insufficient documentation

## 2016-11-15 DIAGNOSIS — F250 Schizoaffective disorder, bipolar type: Secondary | ICD-10-CM

## 2016-11-15 DIAGNOSIS — J440 Chronic obstructive pulmonary disease with acute lower respiratory infection: Secondary | ICD-10-CM | POA: Diagnosis not present

## 2016-11-15 DIAGNOSIS — Y929 Unspecified place or not applicable: Secondary | ICD-10-CM | POA: Insufficient documentation

## 2016-11-15 DIAGNOSIS — Z590 Homelessness: Secondary | ICD-10-CM | POA: Diagnosis not present

## 2016-11-15 DIAGNOSIS — I10 Essential (primary) hypertension: Secondary | ICD-10-CM | POA: Diagnosis not present

## 2016-11-15 DIAGNOSIS — F1410 Cocaine abuse, uncomplicated: Secondary | ICD-10-CM | POA: Insufficient documentation

## 2016-11-15 DIAGNOSIS — J9601 Acute respiratory failure with hypoxia: Principal | ICD-10-CM

## 2016-11-15 DIAGNOSIS — D75A Glucose-6-phosphate dehydrogenase (G6PD) deficiency without anemia: Secondary | ICD-10-CM | POA: Diagnosis present

## 2016-11-15 DIAGNOSIS — B182 Chronic viral hepatitis C: Secondary | ICD-10-CM

## 2016-11-15 DIAGNOSIS — Z888 Allergy status to other drugs, medicaments and biological substances status: Secondary | ICD-10-CM | POA: Diagnosis not present

## 2016-11-15 DIAGNOSIS — B20 Human immunodeficiency virus [HIV] disease: Secondary | ICD-10-CM

## 2016-11-15 DIAGNOSIS — R4182 Altered mental status, unspecified: Secondary | ICD-10-CM | POA: Diagnosis present

## 2016-11-15 DIAGNOSIS — F17210 Nicotine dependence, cigarettes, uncomplicated: Secondary | ICD-10-CM | POA: Insufficient documentation

## 2016-11-15 DIAGNOSIS — G92 Toxic encephalopathy: Secondary | ICD-10-CM | POA: Insufficient documentation

## 2016-11-15 DIAGNOSIS — Z881 Allergy status to other antibiotic agents status: Secondary | ICD-10-CM | POA: Diagnosis not present

## 2016-11-15 DIAGNOSIS — Z8614 Personal history of Methicillin resistant Staphylococcus aureus infection: Secondary | ICD-10-CM | POA: Insufficient documentation

## 2016-11-15 DIAGNOSIS — F10929 Alcohol use, unspecified with intoxication, unspecified: Secondary | ICD-10-CM | POA: Diagnosis present

## 2016-11-15 DIAGNOSIS — M5430 Sciatica, unspecified side: Secondary | ICD-10-CM | POA: Diagnosis not present

## 2016-11-15 DIAGNOSIS — S2231XA Fracture of one rib, right side, initial encounter for closed fracture: Secondary | ICD-10-CM | POA: Diagnosis not present

## 2016-11-15 DIAGNOSIS — G9389 Other specified disorders of brain: Secondary | ICD-10-CM | POA: Diagnosis not present

## 2016-11-15 DIAGNOSIS — F259 Schizoaffective disorder, unspecified: Secondary | ICD-10-CM | POA: Diagnosis not present

## 2016-11-15 DIAGNOSIS — D550 Anemia due to glucose-6-phosphate dehydrogenase [G6PD] deficiency: Secondary | ICD-10-CM | POA: Diagnosis not present

## 2016-11-15 DIAGNOSIS — F1910 Other psychoactive substance abuse, uncomplicated: Secondary | ICD-10-CM | POA: Diagnosis present

## 2016-11-15 DIAGNOSIS — Z9119 Patient's noncompliance with other medical treatment and regimen: Secondary | ICD-10-CM | POA: Diagnosis not present

## 2016-11-15 LAB — COMPREHENSIVE METABOLIC PANEL
ALT: 41 U/L (ref 17–63)
AST: 73 U/L — ABNORMAL HIGH (ref 15–41)
Albumin: 3.3 g/dL — ABNORMAL LOW (ref 3.5–5.0)
Alkaline Phosphatase: 61 U/L (ref 38–126)
Anion gap: 7 (ref 5–15)
BUN: 14 mg/dL (ref 6–20)
CO2: 21 mmol/L — ABNORMAL LOW (ref 22–32)
Calcium: 8.3 mg/dL — ABNORMAL LOW (ref 8.9–10.3)
Chloride: 112 mmol/L — ABNORMAL HIGH (ref 101–111)
Creatinine, Ser: 0.97 mg/dL (ref 0.61–1.24)
GFR calc Af Amer: >60 mL/min (ref >60)
GFR calc non Af Amer: >60 mL/min (ref >60)
Glucose, Bld: 112 mg/dL — ABNORMAL HIGH (ref 65–99)
Potassium: 3.4 mmol/L — ABNORMAL LOW (ref 3.5–5.1)
Sodium: 140 mmol/L (ref 135–145)
Total Bilirubin: 0.5 mg/dL (ref 0.3–1.2)
Total Protein: 7.8 g/dL (ref 6.5–8.1)

## 2016-11-15 LAB — RAPID URINE DRUG SCREEN, HOSP PERFORMED
Amphetamines: NOT DETECTED
Barbiturates: NOT DETECTED
Benzodiazepines: NOT DETECTED
Cocaine: NOT DETECTED
Opiates: NOT DETECTED
Tetrahydrocannabinol: NOT DETECTED

## 2016-11-15 LAB — URINALYSIS, ROUTINE W REFLEX MICROSCOPIC
Bilirubin Urine: NEGATIVE
Glucose, UA: NEGATIVE mg/dL
Hgb urine dipstick: NEGATIVE
Ketones, ur: NEGATIVE mg/dL
Leukocytes, UA: NEGATIVE
Nitrite: NEGATIVE
Protein, ur: NEGATIVE mg/dL
Specific Gravity, Urine: 1.006 (ref 1.005–1.030)
pH: 5 (ref 5.0–8.0)

## 2016-11-15 LAB — AMMONIA: Ammonia: 42 umol/L — ABNORMAL HIGH (ref 9–35)

## 2016-11-15 LAB — CBC WITH DIFFERENTIAL/PLATELET
Basophils Absolute: 0 10*3/uL (ref 0.0–0.1)
Basophils Relative: 0 %
Eosinophils Absolute: 0.1 10*3/uL (ref 0.0–0.7)
Eosinophils Relative: 2 %
HCT: 36 % — ABNORMAL LOW (ref 39.0–52.0)
Hemoglobin: 12 g/dL — ABNORMAL LOW (ref 13.0–17.0)
Lymphocytes Relative: 25 %
Lymphs Abs: 1 10*3/uL (ref 0.7–4.0)
MCH: 32 pg (ref 26.0–34.0)
MCHC: 33.3 g/dL (ref 30.0–36.0)
MCV: 96 fL (ref 78.0–100.0)
Monocytes Absolute: 0.3 10*3/uL (ref 0.1–1.0)
Monocytes Relative: 9 %
Neutro Abs: 2.5 10*3/uL (ref 1.7–7.7)
Neutrophils Relative %: 64 %
Platelets: 107 10*3/uL — ABNORMAL LOW (ref 150–400)
RBC: 3.75 MIL/uL — ABNORMAL LOW (ref 4.22–5.81)
RDW: 13.3 % (ref 11.5–15.5)
WBC: 4 10*3/uL (ref 4.0–10.5)

## 2016-11-15 LAB — VITAMIN B12: Vitamin B-12: 393 pg/mL (ref 180–914)

## 2016-11-15 LAB — ACETAMINOPHEN LEVEL: Acetaminophen (Tylenol), Serum: <10 ug/mL — ABNORMAL LOW (ref 10–30)

## 2016-11-15 LAB — ETHANOL: Alcohol, Ethyl (B): 144 mg/dL — ABNORMAL HIGH (ref <5)

## 2016-11-15 LAB — SALICYLATE LEVEL: Salicylate Lvl: <7 mg/dL (ref 2.8–30.0)

## 2016-11-15 LAB — TSH: TSH: 0.398 u[IU]/mL (ref 0.350–4.500)

## 2016-11-15 MED ORDER — ADULT MULTIVITAMIN W/MINERALS CH
1.0000 | ORAL_TABLET | Freq: Every day | ORAL | Status: DC
  Administered 2016-11-16 – 2016-11-17 (×2): 1 via ORAL
  Filled 2016-11-15 (×2): qty 1

## 2016-11-15 MED ORDER — DEXTROSE 5 % IV SOLN
1.0000 g | Freq: Once | INTRAVENOUS | Status: AC
  Administered 2016-11-15: 1 g via INTRAVENOUS
  Filled 2016-11-15: qty 10

## 2016-11-15 MED ORDER — HEPARIN SODIUM (PORCINE) 5000 UNIT/ML IJ SOLN
5000.0000 [IU] | Freq: Three times a day (TID) | INTRAMUSCULAR | Status: DC
  Administered 2016-11-15 – 2016-11-17 (×5): 5000 [IU] via SUBCUTANEOUS
  Filled 2016-11-15 (×5): qty 1

## 2016-11-15 MED ORDER — SODIUM CHLORIDE 0.9 % IV SOLN
3.0000 g | Freq: Four times a day (QID) | INTRAVENOUS | Status: DC
  Administered 2016-11-15 – 2016-11-16 (×3): 3 g via INTRAVENOUS
  Filled 2016-11-15 (×4): qty 3

## 2016-11-15 MED ORDER — THIAMINE HCL 100 MG/ML IJ SOLN
Freq: Once | INTRAVENOUS | Status: AC
  Administered 2016-11-15: 19:00:00 via INTRAVENOUS
  Filled 2016-11-15: qty 1000

## 2016-11-15 MED ORDER — FOLIC ACID 1 MG PO TABS
1.0000 mg | ORAL_TABLET | Freq: Every day | ORAL | Status: DC
  Administered 2016-11-16 – 2016-11-17 (×2): 1 mg via ORAL
  Filled 2016-11-15 (×2): qty 1

## 2016-11-15 MED ORDER — SODIUM CHLORIDE 0.9 % IV BOLUS (SEPSIS)
1000.0000 mL | Freq: Once | INTRAVENOUS | Status: AC
  Administered 2016-11-15: 1000 mL via INTRAVENOUS

## 2016-11-15 MED ORDER — DEXTROSE 5 % IV SOLN
500.0000 mg | Freq: Once | INTRAVENOUS | Status: AC
  Administered 2016-11-15: 500 mg via INTRAVENOUS
  Filled 2016-11-15: qty 500

## 2016-11-15 MED ORDER — NALOXONE HCL 0.4 MG/ML IJ SOLN
0.4000 mg | Freq: Once | INTRAMUSCULAR | Status: AC
  Administered 2016-11-15: 0.4 mg via INTRAVENOUS
  Filled 2016-11-15: qty 1

## 2016-11-15 MED ORDER — VITAMIN B-1 100 MG PO TABS
100.0000 mg | ORAL_TABLET | Freq: Every day | ORAL | Status: DC
  Administered 2016-11-16 – 2016-11-17 (×2): 100 mg via ORAL
  Filled 2016-11-15 (×2): qty 1

## 2016-11-15 MED ORDER — SODIUM CHLORIDE 0.9% FLUSH
3.0000 mL | Freq: Two times a day (BID) | INTRAVENOUS | Status: DC
  Administered 2016-11-16 – 2016-11-17 (×3): 3 mL via INTRAVENOUS

## 2016-11-15 NOTE — ED Triage Notes (Signed)
Per EMS, pt was found unconscious and unable to arouse by a bystander. Pt had pinpoint pupils and O2 sats in lower 80s upon EMS arrival. Pt was given 1mg  narcan IV by EMS, which improved respirations. Pt also smelled of ETOH.   CBG 161 BP 121/84 SpO2 93% HR 90 NSR RR 8/shallow before narcan, 16 after narcan.  EtCO2 30

## 2016-11-15 NOTE — ED Notes (Signed)
Gave report to Selena BattenKim, RN for assigned from 1514.
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
## 2016-11-15 NOTE — Progress Notes (Signed)
Pharmacy Antibiotic Note  [name] is a 61 y.o. male admitted on 11/15/2016 with acute encephalopathy and aspiration pneumonia. Pharmacy has been consulted for Unasyn dosing.  Plan: Unasyn 3g IV q6h. Monitor renal function, cultures, clinical course.   Height: 6' (182.9 cm) Weight: 236 lb 12.4 oz (107.4 kg) IBW/kg (Calculated) : 77.6  Temp (24hrs), Avg:98.5 F (36.9 C), Min:98.5 F (36.9 C), Max:98.5 F (36.9 C)   Recent Labs Lab 11/15/16 1311  WBC 4.0  CREATININE 0.97    Estimated Creatinine Clearance: 98.7 mL/min (by C-G formula based on SCr of 0.97 mg/dL).    Allergies  Allergen Reactions  . Vancomycin Other (See Comments)    Pt had renal failure while on vancomycin for MRSA bacteremia[facility]  . Didanosine Other (See Comments)    Possibly itching  . Haloperidol Lactate Swelling    Tongue swelling  . Tenofovir Disoproxil Palpitations    Pt was admitted with renal failure and TNF stopped but not clearly proven to have been culprit  . Zidovudine Other (See Comments)    Possibly itching    Antimicrobials this admission: 6/26 Ceftriaxone x 1 6/26 Azithromycin x 1 6/26 Unasyn >>  Dose adjustments this admission: --  Microbiology results: 6/26 BCx: sent   Thank you for allowing pharmacy to be a part of this patient's care.   Greer PickerelJigna Nobie Alleyne, PharmD, BCPS Pager: [phone] 11/15/2016 5:17 PM

## 2016-11-15 NOTE — ED Notes (Signed)
Bed: WA07 Expected date:  Expected time:  Means of arrival:  Comments: EMS-heroin OD
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
## 2016-11-16 DIAGNOSIS — J9601 Acute respiratory failure with hypoxia: Secondary | ICD-10-CM | POA: Diagnosis not present

## 2016-11-16 LAB — BASIC METABOLIC PANEL
Anion gap: 5 (ref 5–15)
BUN: 11 mg/dL (ref 6–20)
CO2: 25 mmol/L (ref 22–32)
Calcium: 8.3 mg/dL — ABNORMAL LOW (ref 8.9–10.3)
Chloride: 112 mmol/L — ABNORMAL HIGH (ref 101–111)
Creatinine, Ser: 0.76 mg/dL (ref 0.61–1.24)
GFR calc Af Amer: >60 mL/min (ref >60)
GFR calc non Af Amer: >60 mL/min (ref >60)
Glucose, Bld: 107 mg/dL — ABNORMAL HIGH (ref 65–99)
Potassium: 3.8 mmol/L (ref 3.5–5.1)
Sodium: 142 mmol/L (ref 135–145)

## 2016-11-16 LAB — CBC
HCT: 37.2 % — ABNORMAL LOW (ref 39.0–52.0)
Hemoglobin: 12.4 g/dL — ABNORMAL LOW (ref 13.0–17.0)
MCH: 32.2 pg (ref 26.0–34.0)
MCHC: 33.3 g/dL (ref 30.0–36.0)
MCV: 96.6 fL (ref 78.0–100.0)
Platelets: 91 10*3/uL — ABNORMAL LOW (ref 150–400)
RBC: 3.85 MIL/uL — ABNORMAL LOW (ref 4.22–5.81)
RDW: 13.2 % (ref 11.5–15.5)
WBC: 4.9 10*3/uL (ref 4.0–10.5)

## 2016-11-16 LAB — RPR: RPR Ser Ql: NONREACTIVE

## 2016-11-16 LAB — T-HELPER CELLS (CD4) COUNT (NOT AT ARMC)
CD4 % Helper T Cell: 13 % — ABNORMAL LOW (ref 33–55)
CD4 T Cell Abs: 150 /uL — ABNORMAL LOW (ref 400–2700)

## 2016-11-16 MED ORDER — PANTOPRAZOLE SODIUM 40 MG PO TBEC
40.0000 mg | DELAYED_RELEASE_TABLET | Freq: Every day | ORAL | Status: DC
Start: 2016-11-16 — End: 2016-11-17
  Administered 2016-11-16 – 2016-11-17 (×2): 40 mg via ORAL
  Filled 2016-11-16 (×2): qty 1

## 2016-11-16 MED ORDER — GABAPENTIN 300 MG PO CAPS
300.0000 mg | ORAL_CAPSULE | Freq: Every day | ORAL | Status: DC
  Administered 2016-11-16 – 2016-11-17 (×2): 300 mg via ORAL
  Filled 2016-11-16 (×2): qty 1

## 2016-11-16 MED ORDER — PREMIER PROTEIN SHAKE
11.0000 [oz_av] | Freq: Two times a day (BID) | ORAL | Status: DC
  Administered 2016-11-16 – 2016-11-17 (×3): 11 [oz_av] via ORAL
  Filled 2016-11-16 (×4): qty 325.31

## 2016-11-16 MED ORDER — AMOXICILLIN-POT CLAVULANATE 875-125 MG PO TABS
1.0000 | ORAL_TABLET | Freq: Two times a day (BID) | ORAL | Status: DC
  Administered 2016-11-16 – 2016-11-17 (×3): 1 via ORAL
  Filled 2016-11-16 (×3): qty 1

## 2016-11-16 MED ORDER — ALBUTEROL SULFATE (2.5 MG/3ML) 0.083% IN NEBU
3.0000 mL | INHALATION_SOLUTION | Freq: Two times a day (BID) | RESPIRATORY_TRACT | Status: DC
  Administered 2016-11-16 – 2016-11-17 (×2): 3 mL via RESPIRATORY_TRACT
  Filled 2016-11-16 (×2): qty 3

## 2016-11-16 NOTE — Progress Notes (Signed)
@IPLOG @        PROGRESS NOTE                                                                                                                                                                                                             Patient Demographics:    [name], is a 61 y.o. male, DOB - [birth year], ZOX:[id number]  Admit date - 11/15/2016   Admitting Physician Tyrone Nine, MD  Outpatient Primary MD for the patient is [facility]  LOS - 0  Chief Complaint  Patient presents with  . Drug Overdose       Brief Narrative   [name] is a 61 y.o. male with a history of AIDS, substance abuse, bipolar disorder, and chronic hepatitis C who was found down by a bystander 6/26 and brought in by EMS. Further ER workup suggested alcohol intoxication with negative head CT.   Subjective:    [name] Alert today has, No headache, No chest pain, No abdominal pain - No Nausea, No new weakness tingling or numbness, No Cough - SOB.     Assessment  & Plan :     1. Alcohol abuse with toxic encephalopathy. Patient says he binge drank before he passed out, he drinks about 1-2 times a week, currently homeless, head CT unremarkable, currently symptom free, no focal deficits, we will have him see by PT, case management and social work, advance activity, if stable discharge in the morning. Counseled to quit binge drinking alcohol. No signs of DTs.  2. History of HIV, AIDS and hep C. Severely noncompliant,  he follows with Dr. Algis Liming, we'll have him follow with ID postdischarge. He is currently not taking his HIV medications and Bactrim. No signs of active infection. CD4 count historically has been below 100, current CD4 count is pending. Counseled on compliance.  3. She is affective/bipolar disorder/homelessness. Home medications started, social work case management requested to assist.  4. History of G6PD deficiency. Monitor. Avoid potentially toxic medications.   Diet :  Diet regular Room service appropriate? Yes; Fluid consistency: Thin    Family Communication  : None  Code Status :  Full  Disposition Plan  :  TBD  Consults  :  S work and CM  Procedures  :  CT head - Non Acute  DVT Prophylaxis  :   Heparin    Lab Results  Component Value Date   PLT 91 (L) 11/16/2016    Inpatient Medications  Scheduled Meds: . amoxicillin-clavulanate  1 tablet Oral Q12H  . folic acid  1 mg Oral Daily  . heparin  5,000 Units Subcutaneous Q8H  . multivitamin with minerals  1 tablet Oral Daily  . protein supplement shake  11 oz Oral BID BM  . sodium chloride flush  3 mL Intravenous Q12H  . thiamine  100 mg Oral Daily   Continuous Infusions: PRN Meds:.  Antibiotics  :    Anti-infectives    Start     Dose/Rate Route Frequency Ordered Stop   11/16/16 1000  amoxicillin-clavulanate (AUGMENTIN) 875-125 MG per tablet 1 tablet     1 tablet Oral Every 12 hours 11/16/16 0810     11/15/16 1800  Ampicillin-Sulbactam (UNASYN) 3 g in sodium chloride 0.9 % 100 mL IVPB  Status:  Discontinued     3 g 200 mL/hr over 30 Minutes Intravenous Every 6 hours 11/15/16 1713 11/16/16 0810   11/15/16 1545  cefTRIAXone (ROCEPHIN) 1 g in dextrose 5 % 50 mL IVPB     1 g 100 mL/hr over 30 Minutes Intravenous  Once 11/15/16 1544 11/15/16 1644   11/15/16 1545  azithromycin (ZITHROMAX) 500 mg in dextrose 5 % 250 mL IVPB     500 mg 250 mL/hr over 60 Minutes Intravenous  Once 11/15/16 1544 11/15/16 1739         Objective:   Vitals:   11/15/16 1616 11/15/16 1700 11/15/16 2125 11/16/16 0423  BP: (!) 144/93 (!) 173/89 (!) 145/94 (!) 137/92  Pulse: 65 71 70 63  Resp: 15 20 18 20   Temp:  97.9 F (36.6 C) 98.4 F (36.9 C) 98.1 F (36.7 C)  TempSrc:  Oral Oral Oral  SpO2: 95% 100% 98% 99%  Weight:  107.4 kg (236 lb 12.4 oz)    Height:  6' (1.829 m)      Wt Readings from Last 3 Encounters:  11/15/16 107.4 kg (236 lb 12.4 oz)  10/27/16 97.1 kg (214 lb)  07/22/16 97.1 kg (214  lb)     Intake/Output Summary (Last 24 hours) at 11/16/16 1217 Last data filed at 11/16/16 0840  Gross per 24 hour  Intake              740 ml  Output             2385 ml  Net            -1645 ml     Physical Exam  Awake Alert, Oriented X 3, No new F.N deficits, Normal affect .AT,PERRAL Supple Neck,No JVD, No cervical lymphadenopathy appriciated.  Symmetrical Chest wall movement, Good air movement bilaterally, CTAB RRR,No Gallops,Rubs or new Murmurs, No Parasternal Heave +ve B.Sounds, Abd Soft, No tenderness, No organomegaly appriciated, No rebound - guarding or rigidity. No Cyanosis, Clubbing or edema, No new Rash or bruise       Data Review:    CBC  Recent Labs Lab 11/15/16 1311 11/16/16 0525  WBC 4.0 4.9  HGB 12.0* 12.4*  HCT 36.0* 37.2*  PLT 107* 91*  MCV 96.0 96.6  MCH 32.0 32.2  MCHC 33.3 33.3  RDW 13.3 13.2  LYMPHSABS 1.0  --   MONOABS 0.3  --   EOSABS 0.1  --   BASOSABS 0.0  --     Chemistries   Recent Labs Lab 11/15/16 1311 11/16/16 0525  NA 140 142  K 3.4* 3.8  CL 112* 112*  CO2 21* 25  GLUCOSE 112* 107*  BUN 14 11  CREATININE 0.97 0.76  CALCIUM 8.3* 8.3*  AST 73*  --   ALT 41  --   ALKPHOS 61  --   BILITOT 0.5  --    ------------------------------------------------------------------------------------------------------------------ No results for input(s): CHOL, HDL, LDLCALC, TRIG, CHOLHDL, LDLDIRECT in the last 72 hours.  No results found for: HGBA1C ------------------------------------------------------------------------------------------------------------------  Recent Labs  11/15/16 1600  TSH 0.398   ------------------------------------------------------------------------------------------------------------------  Recent Labs  11/15/16 1600  VITAMINB12 393    Coagulation profile No results for input(s): INR, PROTIME in the last 168 hours.  No results for input(s): DDIMER in the last 72 hours.  Cardiac Enzymes No  results for input(s): CKMB, TROPONINI, MYOGLOBIN in the last 168 hours.  Invalid input(s): CK ------------------------------------------------------------------------------------------------------------------ No results found for: BNP  Micro Results No results found for this or any previous visit (from the past 240 hour(s)).  Radiology Reports Dg Ribs Unilateral W/chest Right  Result Date: 11/03/2016 CLINICAL DATA:  Fall 2 days ago with right anterolateral chest wall pain. EXAM: RIGHT RIBS AND CHEST - 3+ VIEW COMPARISON:  Chest x-ray 07/22/2016 FINDINGS: Low volume film without focal airspace consolidation or pulmonary edema. No evidence for pleural effusion. No pneumothorax. Cardiopericardial silhouette is at upper limits of normal for size. Oblique views of the right ribs show a nondisplaced acute fracture of the right seventh rib. IMPRESSION: Acute nondisplaced right seventh rib fracture. Electronically Signed   By: Kennith CenterEric  Mansell M.D.   On: 11/03/2016 17:39   Ct Head Wo Contrast  Result Date: 11/15/2016 CLINICAL DATA:  61 year old male with altered mental status and unresponsive. EXAM: CT HEAD WITHOUT CONTRAST TECHNIQUE: Contiguous axial images were obtained from the base of the skull through the vertex without intravenous contrast. COMPARISON:  09/12/2016 and prior CTs FINDINGS: Brain: No evidence of acute infarction, hemorrhage, hydrocephalus, extra-axial collection or mass lesion/mass effect. Atrophy, chronic small-vessel white matter ischemic changes and bifrontal encephalomalacia again noted. Vascular: No hyperdense vessel or unexpected calcification. Skull: Normal. Negative for fracture or focal lesion. Sinuses/Orbits: No acute finding. Other: None. IMPRESSION: No evidence of acute intracranial abnormality. Atrophy, chronic small-vessel white matter ischemic changes and bifrontal encephalomalacia. Electronically Signed   By: Harmon PierJeffrey  Hu M.D.   On: 11/15/2016 14:44   Dg Chest Portable 1  View  Result Date: 11/15/2016 CLINICAL DATA:  Patient was found unconscious with pinpoint pupils and hypoxia history of COPD, HIV, renal failure, alcohol on board. EXAM: PORTABLE CHEST 1 VIEW COMPARISON:  Chest x-ray of November 03, 2016 FINDINGS: The lungs are mildly hypoinflated. There is are patchy densities at both lung bases. The heart and pulmonary vascularity are normal. The mediastinum is normal in width. The bony thorax exhibits no acute abnormality. IMPRESSION: Bibasilar atelectasis or pneumonia. Followup PA and lateral chest X-ray is recommended in 3-4 weeks following trial of antibiotic therapy to ensure resolution and exclude underlying malignancy. Electronically Signed   By: David  SwazilandJordan M.D.   On: 11/15/2016 13:43    Time Spent in minutes  30   Susa RaringPrashant Arthi Mcdonald M.D on 11/16/2016 at 12:17 PM  Between 7am to 7pm - Pager - [phone] ( page via [email or web address], text pages only, please mention full 10 digit call back number). After 7pm go to [email or web address] - password [email or web address]

## 2016-11-16 NOTE — Progress Notes (Signed)
Initial Nutrition Assessment  DOCUMENTATION CODES:   Obesity unspecified  INTERVENTION:   Premier Protein BID, each supplement provides 160kcal and 30g protein.   MVI  NUTRITION DIAGNOSIS:   Increased nutrient needs related to catabolic illness (COPD, AIDS, Hep C) as evidenced by increased estimated needs from protein.  GOAL:   Patient will meet greater than or equal to 90% of their needs  MONITOR:   PO intake, Supplement acceptance, Labs, Weight trends  REASON FOR ASSESSMENT:   Malnutrition Screening Tool    ASSESSMENT:   61 y.o. male with a history of AIDS, substance abuse, bipolar disorder, and chronic hepatitis C who was found down by a bystander 6/26 and brought in by EMS and admitted on 11/15/2016 with acute encephalopathy and aspiration pneumonia.   Pt with good appetite and eating 100% of meals. Per chart, pt with weight gain. RD will order supplements to help pt meet estimated needs.   Medications reviewed and include: augmentin, folic acid, heparin, MVI, thiamine  Labs reviewed: Cl 112(H), Ca 8.3(L)  Nutrition-Focused physical exam completed. Findings are no fat depletion, no muscle depletion, and no edema.   Diet Order:  Diet regular Room service appropriate? Yes; Fluid consistency: Thin  Skin:  Reviewed, no issues  Last BM:  none since admit   Height:   Ht Readings from Last 1 Encounters:  11/15/16 6' (1.829 m)    Weight:   Wt Readings from Last 1 Encounters:  11/15/16 236 lb 12.4 oz (107.4 kg)    Ideal Body Weight:  80.9 kg  BMI:  Body mass index is 32.11 kg/m.  Estimated Nutritional Needs:   Kcal:  2400-2700kcal/day   Protein:  >129g/day   Fluid:  >2.4L/day   EDUCATION NEEDS:   No education needs identified at this time  Betsey Holidayasey Kristan Votta MS, RD, LDN Pager #[phone]- [phone] After Hours Pager: [phone]

## 2016-11-16 NOTE — Evaluation (Signed)
Physical Therapy Evaluation Patient Details Name: [name] MRN: [id number] DOB: [birth year] Today's Date: 11/16/2016   History of Present Illness  [name] is a 61 y.o. male with a history of AIDS, substance abuse, bipolar disorder, and chronic hepatitis C who was found down by a bystander 6/26 and brought in by EMS.   Clinical Impression  Patient evaluated by Physical Therapy with no further acute PT needs identified. All education has been completed and the patient has no further questions.  See below for any follow-up Physical Therapy or equipment needs. PT is signing off. Thank you for this referral.     Follow Up Recommendations No PT follow up    Equipment Recommendations  None recommended by PT    Recommendations for Other Services       Precautions / Restrictions Precautions Precautions: Fall      Mobility  Bed Mobility Overal bed mobility: Independent Bed Mobility: Supine to Sit              Transfers Overall transfer level: Needs assistance   Transfers: Sit to/from Stand Sit to Stand: Supervision         General transfer comment: for safety  Ambulation/Gait Ambulation/Gait assistance: Supervision Ambulation Distance (Feet): 240 Feet Assistive device: None Gait Pattern/deviations: WFL(Within Functional Limits)     General Gait Details: pt reported mild dizziness initially which subsided, no LOB  Stairs            Wheelchair Mobility    Modified Rankin (Stroke Patients Only)       Balance Overall balance assessment: Needs assistance;History of Falls                           High level balance activites: Direction changes;Backward walking;Turns;Head turns High Level Balance Comments: pt reports his last fall was d/t drinking; no LOB with above             Pertinent Vitals/Pain Pain Assessment: 0-10 Pain Score: 2  Pain Location: back Pain Descriptors / Indicators: Sore Pain Intervention(s): Limited  activity within patient's tolerance;Monitored during session    Home Living Family/patient expects to be discharged to:: Other (Comment) (wants to go to rehab to stop drinking)                 Additional Comments: pt reports he stays with differnt people/women    Prior Function Level of Independence: Independent               Hand Dominance        Extremity/Trunk Assessment   Upper Extremity Assessment Upper Extremity Assessment: Overall WFL for tasks assessed    Lower Extremity Assessment Lower Extremity Assessment: Overall WFL for tasks assessed       Communication   Communication: No difficulties  Cognition Arousal/Alertness: Awake/alert Behavior During Therapy: WFL for tasks assessed/performed Overall Cognitive Status: Within Functional Limits for tasks assessed                                        General Comments      Exercises     Assessment/Plan    PT Assessment Patient needs continued PT services  PT Problem List         PT Treatment Interventions      PT Goals (Current goals can be found in the Care Plan section)  Acute Rehab PT Goals Patient Stated Goal: to go to rehab to stop drinking PT Goal Formulation: All assessment and education complete, DC therapy    Frequency     Barriers to discharge        Co-evaluation               AM-PAC PT "6 Clicks" Daily Activity  Outcome Measure Difficulty turning over in bed (including adjusting bedclothes, sheets and blankets)?: None Difficulty moving from lying on back to sitting on the side of the bed? : None Difficulty sitting down on and standing up from a chair with arms (e.g., wheelchair, bedside commode, etc,.)?: None Help needed moving to and from a bed to chair (including a wheelchair)?: None Help needed walking in hospital room?: None Help needed climbing 3-5 steps with a railing? : A Little 6 Click Score: 23    End of Session Equipment Utilized During  Treatment: Gait belt Activity Tolerance: Patient tolerated treatment well Patient left: with call bell/phone within reach;in chair Nurse Communication: Mobility status PT Visit Diagnosis: Difficulty in walking, not elsewhere classified (R26.2)    Time: [id number] PT Time Calculation (min) (ACUTE ONLY): 11 min   Charges:   PT Evaluation $PT Eval Low Complexity: 1 Procedure     PT G Codes:   PT G-Codes **NOT FOR INPATIENT CLASS** Functional Assessment Tool Used: AM-PAC 6 Clicks Basic Mobility;Clinical judgement Functional Limitation: Mobility: Walking and moving around Mobility: Walking and Moving Around Current Status (Q0347(G8978): At least 1 percent but less than 20 percent impaired, limited or restricted Mobility: Walking and Moving Around Goal Status [phone](G8979): At least 1 percent but less than 20 percent impaired, limited or restricted Mobility: Walking and Moving Around Discharge Status [phone](G8980): At least 1 percent but less than 20 percent impaired, limited or restricted    Drucilla Chaletara Lanier Millon, PT Pager: [phone] 11/16/2016   [facility] HospitalWILLIAMS,Rether Rison 11/16/2016, 2:51 PM

## 2016-11-16 NOTE — Clinical Social Work Note (Signed)
Clinical Social Work Assessment  Patient Details  Name: [name] MRN: [id number] Date of Birth: 12/27/1955  Date of referral:  11/16/16               Reason for consult:  Housing Concerns/Homelessness, Substance Use/ETOH Abuse                Permission sought to share information with:  Family Supports Permission granted to share information::     Name::     Biggers::     Relationship::  Sister   Contact Information:  [phone]  Housing/Transportation Living arrangements for the past 2 months:  Homeless Shelter, No permanent address Source of Information:  Patient Patient Interpreter Needed:  None Criminal Activity/Legal Involvement Pertinent to Current Situation/Hospitalization:  No - Comment as needed Significant Relationships:  Warehouse manager, Friend Lives with:  Friends Do you feel safe going back to the place where you live?  Yes Need for family participation in patient care:  Yes (Comment)  Care giving concerns: Patient was found unresponsive by bystander and brought in by EMS. Patient has history of AIDS, substance abuse, bipolar disorders and chronic hepatitis C. Patient has been admitted emergency department eleven times in the past six months. Patient reports, "I have a problem with drinking, it is a disease, I am not going to lie and say I can stop." Patient reports he binge drinks and "cannot remember anything."  Facilities manager / plan: LCSWA met with patient at bedside, patient alert and oriented x4 and agreeable to talk. Patient report, "I do not remember anything that happen yesterday." Patient reports he had several 40 ounce drinks yesterday and woke up in the hospital. Patient reports he has been working with Bobbye Riggs Director at [facility] for placement. He reports, the facility is a group home for homeless veterans with mental illness and substance use issues. He reports he completed application and recently went to [facility]  hospital  to apply for benefits to pay for his placement. Patient gave CSW permission to contact him Patient reports he receives a SSI/SSDI check and uses the money to buy alcohol. In the past patient reports several residential treatment programs including [facility], [facility],  "a facility in [place] for 8 months before signing  myself out." He is not interested in completing residentional at this time  Patient reports he continues to go Alcohol Anonymous groups in the community, [employer]  where patient can also have lunch. Patient reports, he goes to the [employer] during the day for services . Patient requested bus pass to transport back to his friends home at discharge.  Plan: Patient will continue going to [facility] groups.   Employment status:  Disabled  Nurse, adult PT Recommendations:  Not assessed at this time Information / Referral to community resources:  Outpatient Substance Abuse Treatment Options, Shelter, Support Groups  Patient/Family's Response to care: No family at bedside, patient pleasant and thankful for csw services.   Patient/Family's Understanding of and Emotional Response to Diagnosis, Current Treatment, and Prognosis:  " I am just happy someone found me." Patient understands his drinking is affecting his health. Patient is not ready to commit to longterm residential treatment facility Patient says he like going to outpatient [facility] groups.   Emotional Assessment Appearance:  Developmentally appropriate Attitude/Demeanor/Rapport:    Affect (typically observed):  Accepting, Calm Orientation:  Oriented to Self, Oriented to Place, Oriented to  Time, Oriented to Situation Alcohol /  Substance use:  Not Applicable Psych involvement (Current and /or in the community):  No (Comment)  Discharge Needs  Concerns to be addressed:  Substance Abuse Concerns, Mental Health Concerns, Medication Concerns, Homelessness: Patient reports he  use to go to [facility] for medication management but stopped going about six months ago, pt. Is not sure who is prescribing his medications, he "picks them up at pharmacy on [address]."    Readmission within the last 30 days:  Yes Current discharge risk:  Substance Abuse, Homeless Barriers to Discharge:  Continued Medical Work up   [employer], LCSW 11/16/2016, 12:26 PM

## 2016-11-17 DIAGNOSIS — J9601 Acute respiratory failure with hypoxia: Secondary | ICD-10-CM | POA: Diagnosis not present

## 2016-11-17 MED ORDER — CARVEDILOL 3.125 MG PO TABS: 3.1250 mg | ORAL_TABLET | Freq: Two times a day (BID) | ORAL | 0 refills | Status: DC

## 2016-11-17 MED ORDER — AZITHROMYCIN 500 MG PO TABS: 500.0000 mg | ORAL_TABLET | Freq: Every day | ORAL | 0 refills | Status: DC

## 2016-11-17 MED ORDER — CARVEDILOL 3.125 MG PO TABS
3.1250 mg | ORAL_TABLET | Freq: Two times a day (BID) | ORAL | Status: DC
  Administered 2016-11-17: 3.125 mg via ORAL
  Filled 2016-11-17: qty 1

## 2016-11-17 MED ORDER — SULFAMETHOXAZOLE-TRIMETHOPRIM 800-160 MG PO TABS: 1.0000 | ORAL_TABLET | Freq: Two times a day (BID) | ORAL | 0 refills | Status: DC

## 2016-11-17 MED ORDER — AMLODIPINE BESYLATE 10 MG PO TABS
10.0000 mg | ORAL_TABLET | Freq: Every day | ORAL | Status: DC
  Administered 2016-11-17: 10 mg via ORAL
  Filled 2016-11-17: qty 1

## 2016-11-17 MED ORDER — AMLODIPINE BESYLATE 10 MG PO TABS: 10.0000 mg | ORAL_TABLET | Freq: Every day | ORAL | 0 refills | Status: DC

## 2016-11-17 NOTE — Discharge Summary (Addendum)
[name]:[id number] DOB: June 18, 1955 DOA: 11/15/2016  PCP: [facility]  Admit date: 11/15/2016  Discharge date: 11/17/2016  Admitted From: Home   Disposition:  Safe disposition per social work   Recommendations for Outpatient Follow-up:   Follow up with PCP in 1-2 weeks  PCP Please obtain BMP/CBC, 2 view CXR in 1week,  (see Discharge instructions)   PCP Please follow up on the following pending results: Monitor compliance with HIV medications, monitor blood pressure, CD4 count, viral load, needs close ID follow-up   Home Health: None  Equipment/Devices: None Consultations: None Discharge Condition: Fair CODE STATUS: Full   Diet Recommendation:  Heart Healthy    Chief Complaint  Patient presents with  . Drug Overdose     Brief history of present illness from the day of admission and additional interim summary    [name] a 61 y.o.malewith a history of AIDS, substance abuse, bipolar disorder, and chronic hepatitis C who was found down by a bystander 6/26 and brought in by EMS. Further ER workup suggested alcohol intoxication with negative head CT.                                                                 Hospital Course    1. Alcohol abuse with toxic encephalopathy. Patient says he binge drank before he passed out, he drinks about 1-2 times a week, currently homeless, head CT unremarkable, currently symptom free, no focal deficits, we will have him see by PT, case management and social work, advance activity, Remains stable will be discharged, disposition per social work. Counseled to quit binge drinking alcohol. No signs of DTs.  2. History of HIV, AIDS and hep C. Severely noncompliant,  he follows with Dr. Algis Liming, we'll have him follow with ID postdischarge.  He is currently not taking his HIV medications and Bactrim. No signs of active infection. CD4 count historically has been below 100, current CD4 count was 150. Counseled on compliance.  3.  history of affective/bipolar disorder/homelessness. Home medications started, social work case management requested to assist. Home medications continued.  4. History of G6PD deficiency. Monitor. Avoid potentially toxic medications. He has been prescribed and has taken Bactrim in the past per patient and per chart review.  6. ? infiltrates on admission. Clinical suspicion for pneumonia low. Place on short course of oral antibiotics upon discharge.   Discharge diagnosis     Principal Problem:   Acute hypoxemic respiratory failure (HCC) Active Problems:   Chronic hepatitis C virus infection (HCC)   G6PD deficiency (HCC)   Schizoaffective disorder (HCC)   AIDS (HCC)   Alcohol intoxication (HCC)   Polysubstance abuse    Discharge instructions    Discharge Instructions    Diet - low sodium heart healthy  Complete by:  As directed    Discharge instructions    Complete by:  As directed    Follow with Primary MD [facility] in 7 days   Get CBC, CMP, 2 view Chest X ray checked  by Primary MD or SNF MD in 5-7 days ( we routinely change or add medications that can affect your baseline labs and fluid status, therefore we recommend that you get the mentioned basic workup next visit with your PCP, your PCP may decide not to get them or add new tests based on their clinical decision)  Activity: As tolerated with Full fall precautions use walker/cane & assistance as needed  Disposition Home   Diet:  Heart Healthy   For Heart failure patients - Check your Weight same time everyday, if you gain over 2 pounds, or you develop in leg swelling, experience more shortness of breath or chest pain, call your Primary MD immediately. Follow Cardiac Low Salt Diet and 1.5 lit/day fluid  restriction.  On your next visit with your primary care physician please Get Medicines reviewed and adjusted.  Please request your Prim.MD to go over all Hospital Tests and Procedure/Radiological results at the follow up, please get all Hospital records sent to your Prim MD by signing hospital release before you go home.  If you experience worsening of your admission symptoms, develop shortness of breath, life threatening emergency, suicidal or homicidal thoughts you must seek medical attention immediately by calling 911 or calling your MD immediately  if symptoms less severe.  You Must read complete instructions/literature along with all the possible adverse reactions/side effects for all the Medicines you take and that have been prescribed to you. Take any new Medicines after you have completely understood and accpet all the possible adverse reactions/side effects.   Do not drive, operate heavy machinery, perform activities at heights, swimming or participation in water activities or provide baby sitting services if your were admitted for syncope or siezures until you have seen by Primary MD or a Neurologist and advised to do so again.  Do not drive when taking Pain medications.    Do not take more than prescribed Pain, Sleep and Anxiety Medications  Special Instructions: If you have smoked or chewed Tobacco  in the last 2 yrs please stop smoking, stop any regular Alcohol  and or any Recreational drug use.  Wear Seat belts while driving.   Please note  You were cared for by a hospitalist during your hospital stay. If you have any questions about your discharge medications or the care you received while you were in the hospital after you are discharged, you can call the unit and asked to speak with the hospitalist on call if the hospitalist that took care of you is not available. Once you are discharged, your primary care physician will handle any further medical issues. Please note that NO  REFILLS for any discharge medications will be authorized once you are discharged, as it is imperative that you return to your primary care physician (or establish a relationship with a primary care physician if you do not have one) for your aftercare needs so that they can reassess your need for medications and monitor your lab values.   Increase activity slowly    Complete by:  As directed       Discharge Medications   Allergies as of 11/17/2016      Reactions   Vancomycin Other (See Comments)   Pt had renal failure  while on vancomycin for MRSA bacteremia, records from [facility] pending   Didanosine Other (See Comments)   Possibly itching   Haloperidol Lactate Swelling   Tongue swelling   Tenofovir Disoproxil Palpitations   Pt was admitted with renal failure and TNF stopped but not clearly proven to have been culprit   Zidovudine Other (See Comments)   Possibly itching      Medication List    TAKE these medications   albuterol 108 (90 Base) MCG/ACT inhaler Commonly known as:  PROVENTIL HFA;VENTOLIN HFA Inhale 2 puffs into the lungs 2 (two) times daily.   amLODipine 10 MG tablet Commonly known as:  NORVASC Take 1 tablet (10 mg total) by mouth daily.   azithromycin 500 MG tablet Commonly known as:  ZITHROMAX Take 1 tablet (500 mg total) by mouth daily.   carvedilol 3.125 MG tablet Commonly known as:  COREG Take 1 tablet (3.125 mg total) by mouth 2 (two) times daily with a meal.   citalopram 40 MG tablet Commonly known as:  CELEXA Take 1 tablet (40 mg total) by mouth daily.   gabapentin 100 MG capsule Commonly known as:  NEURONTIN Take 300 mg by mouth daily.   omeprazole 20 MG capsule Commonly known as:  PRILOSEC Take 20 mg by mouth daily.   risperiDONE 2 MG tablet Commonly known as:  RISPERDAL Take 2 mg by mouth at bedtime.   sertraline 100 MG tablet Commonly known as:  ZOLOFT Take 100 mg by mouth daily.   sulfamethoxazole-trimethoprim 800-160 MG tablet Commonly  known as:  BACTRIM DS,SEPTRA DS Take 1 tablet by mouth 2 (two) times daily.   TRIUMEQ 600-50-300 MG tablet Generic drug:  abacavir-dolutegravir-lamiVUDine TAKE 1 TABLET BY MOUTH DAILY.       Follow-up Information    [facility]. Schedule an appointment as soon as possible for a visit in 2 day(s).   Contact information: [address] [place] [address] [phone]        Daiva Eves, Lisette Grinder, MD. Schedule an appointment as soon as possible for a visit in 2 day(s).   Specialty:  Infectious Diseases Contact information: [address] [place] [address] [phone]           Major procedures and Radiology Reports - PLEASE review detailed and final reports thoroughly  -         Dg Ribs Unilateral W/chest Right  Result Date: 11/03/2016 CLINICAL DATA:  Fall 2 days ago with right anterolateral chest wall pain. EXAM: RIGHT RIBS AND CHEST - 3+ VIEW COMPARISON:  Chest x-ray 07/22/2016 FINDINGS: Low volume film without focal airspace consolidation or pulmonary edema. No evidence for pleural effusion. No pneumothorax. Cardiopericardial silhouette is at upper limits of normal for size. Oblique views of the right ribs show a nondisplaced acute fracture of the right seventh rib. IMPRESSION: Acute nondisplaced right seventh rib fracture. Electronically Signed   By: Kennith Center M.D.   On: 11/03/2016 17:39   Ct Head Wo Contrast  Result Date: 11/15/2016 CLINICAL DATA:  61 year old male with altered mental status and unresponsive. EXAM: CT HEAD WITHOUT CONTRAST TECHNIQUE: Contiguous axial images were obtained from the base of the skull through the vertex without intravenous contrast. COMPARISON:  09/12/2016 and prior CTs FINDINGS: Brain: No evidence of acute infarction, hemorrhage, hydrocephalus, extra-axial collection or mass lesion/mass effect. Atrophy, chronic small-vessel white matter ischemic changes and bifrontal encephalomalacia again noted.  Vascular: No hyperdense vessel or unexpected calcification. Skull: Normal. Negative for fracture  or focal lesion. Sinuses/Orbits: No acute finding. Other: None. IMPRESSION: No evidence of acute intracranial abnormality. Atrophy, chronic small-vessel white matter ischemic changes and bifrontal encephalomalacia. Electronically Signed   By: Harmon Pier M.D.   On: 11/15/2016 14:44   Dg Chest Portable 1 View  Result Date: 11/15/2016 CLINICAL DATA:  Patient was found unconscious with pinpoint pupils and hypoxia history of COPD, HIV, renal failure, alcohol on board. EXAM: PORTABLE CHEST 1 VIEW COMPARISON:  Chest x-ray of November 03, 2016 FINDINGS: The lungs are mildly hypoinflated. There is are patchy densities at both lung bases. The heart and pulmonary vascularity are normal. The mediastinum is normal in width. The bony thorax exhibits no acute abnormality. IMPRESSION: Bibasilar atelectasis or pneumonia. Followup PA and lateral chest X-ray is recommended in 3-4 weeks following trial of antibiotic therapy to ensure resolution and exclude underlying malignancy. Electronically Signed   By: David  [place] M.D.   On: 11/15/2016 13:43    Micro Results     Recent Results (from the past 240 hour(s))  Blood culture (routine x 2)     Status: None (Preliminary result)   Collection Time: 11/15/16  3:57 PM  Result Value Ref Range Status   Specimen Description LEFT ANTECUBITAL  Final   Special Requests   Final    BOTTLES DRAWN AEROBIC AND ANAEROBIC Blood Culture adequate volume   Culture   Final    NO GROWTH < 24 HOURS Performed at [facility], [address], [place], [place] [address]    Report Status PENDING  Incomplete  Blood culture (routine x 2)     Status: None (Preliminary result)   Collection Time: 11/15/16  4:04 PM  Result Value Ref Range Status   Specimen Description BLOOD RIGHT HAND  Final   Special Requests   Final    BOTTLES DRAWN AEROBIC AND ANAEROBIC Blood Culture adequate volume    Culture   Final    NO GROWTH < 24 HOURS Performed at [facility], [address], [place], [place] [address]    Report Status PENDING  Incomplete    Today   Subjective    Beacher Every today has no headache,no chest abdominal pain,no new weakness tingling or numbness, feels much better wants to go home today.     Objective   Blood pressure (!) 171/111, pulse 65, temperature 97.6 F (36.4 C), temperature source Oral, resp. rate 18, height 6' (1.829 m), weight 107.4 kg (236 lb 12.4 oz), SpO2 99 %.   Intake/Output Summary (Last 24 hours) at 11/17/16 0934 Last data filed at 11/17/16 0506  Gross per 24 hour  Intake              240 ml  Output              850 ml  Net             -610 ml    Exam Awake Alert, Oriented x 3, No new F.N deficits, Normal affect [place].AT,PERRAL Supple Neck,No JVD, No cervical lymphadenopathy appriciated.  Symmetrical Chest wall movement, Good air movement bilaterally, CTAB RRR,No Gallops,Rubs or new Murmurs, No Parasternal Heave +ve B.Sounds, Abd Soft, Non tender, No organomegaly appriciated, No rebound -guarding or rigidity. No Cyanosis, Clubbing or edema, No new Rash or bruise   Data Review   CBC w Diff: Lab Results  Component Value Date   WBC 4.9 11/16/2016   HGB 12.4 (L) 11/16/2016   HGB 10.1 (L) 02/28/2014  HCT 37.2 (L) 11/16/2016   HCT 31.7 (L) 02/28/2014   PLT 91 (L) 11/16/2016   PLT 219 02/28/2014   LYMPHOPCT 25 11/15/2016   MONOPCT 9 11/15/2016   EOSPCT 2 11/15/2016   BASOPCT 0 11/15/2016    CMP: Lab Results  Component Value Date   NA 142 11/16/2016   NA 137 02/28/2014   K 3.8 11/16/2016   K 4.2 02/28/2014   CL 112 (H) 11/16/2016   CL 103 02/28/2014   CO2 25 11/16/2016   CO2 27 02/28/2014   BUN 11 11/16/2016   BUN 18 02/28/2014   CREATININE 0.76 11/16/2016   CREATININE 0.82 03/30/2016   PROT 7.8 11/15/2016   PROT 8.3 (H) 02/28/2014   ALBUMIN 3.3 (L) 11/15/2016   ALBUMIN 2.9 (L) 02/28/2014   BILITOT 0.5  11/15/2016   BILITOT 1.6 (H) 02/28/2014   BILITOT 0.7 08/08/2013   ALKPHOS 61 11/15/2016   ALKPHOS 54 02/28/2014   AST 73 (H) 11/15/2016   AST 97 (H) 02/28/2014   ALT 41 11/15/2016   ALT 80 (H) 02/28/2014   ALT 71 (H) 08/08/2013  .   Total Time in preparing paper work, data evaluation and todays exam - 35 minutes  Susa Raring M.D on 11/17/2016 at 9:34 AM  [facility]   Office  [phone]

## 2016-11-17 NOTE — Progress Notes (Signed)
Went over d/c instructions with patient.  He verbalized understanding.  Left hospital with AVS, hard scipts, and bus pass.

## 2016-11-17 NOTE — Discharge Instructions (Signed)
Follow with Primary MD Medicine, [facility] in 7 days   Get CBC, CMP, 2 view Chest X ray checked  by Primary MD or SNF MD in 5-7 days ( we routinely change or add medications that can affect your baseline labs and fluid status, therefore we recommend that you get the mentioned basic workup next visit with your PCP, your PCP may decide not to get them or add new tests based on their clinical decision)  Activity: As tolerated with Full fall precautions use walker/cane & assistance as needed  Disposition Home   Diet:  Heart Healthy   For Heart failure patients - Check your Weight same time everyday, if you gain over 2 pounds, or you develop in leg swelling, experience more shortness of breath or chest pain, call your Primary MD immediately. Follow Cardiac Low Salt Diet and 1.5 lit/day fluid restriction.  On your next visit with your primary care physician please Get Medicines reviewed and adjusted.  Please request your Prim.MD to go over all Hospital Tests and Procedure/Radiological results at the follow up, please get all Hospital records sent to your Prim MD by signing hospital release before you go home.  If you experience worsening of your admission symptoms, develop shortness of breath, life threatening emergency, suicidal or homicidal thoughts you must seek medical attention immediately by calling 911 or calling your MD immediately  if symptoms less severe.  You Must read complete instructions/literature along with all the possible adverse reactions/side effects for all the Medicines you take and that have been prescribed to you. Take any new Medicines after you have completely understood and accpet all the possible adverse reactions/side effects.   Do not drive, operate heavy machinery, perform activities at heights, swimming or participation in water activities or provide baby sitting services if your were admitted for syncope or siezures until you have seen by Primary MD or a  Neurologist and advised to do so again.  Do not drive when taking Pain medications.    Do not take more than prescribed Pain, Sleep and Anxiety Medications  Special Instructions: If you have smoked or chewed Tobacco  in the last 2 yrs please stop smoking, stop any regular Alcohol  and or any Recreational drug use.  Wear Seat belts while driving.   Please note  You were cared for by a hospitalist during your hospital stay. If you have any questions about your discharge medications or the care you received while you were in the hospital after you are discharged, you can call the unit and asked to speak with the hospitalist on call if the hospitalist that took care of you is not available. Once you are discharged, your primary care physician will handle any further medical issues. Please note that NO REFILLS for any discharge medications will be authorized once you are discharged, as it is imperative that you return to your primary care physician (or establish a relationship with a primary care physician if you do not have one) for your aftercare needs so that they can reassess your need for medications and monitor your lab values.

## 2016-11-20 LAB — CULTURE, BLOOD (ROUTINE X 2)
Culture: NO GROWTH
Culture: NO GROWTH
Special Requests: ADEQUATE
Special Requests: ADEQUATE

## 2016-11-27 ENCOUNTER — Encounter (HOSPITAL_COMMUNITY): Payer: Self-pay | Admitting: Emergency Medicine

## 2016-11-27 ENCOUNTER — Emergency Department (HOSPITAL_COMMUNITY): Payer: Medicare Other

## 2016-11-27 ENCOUNTER — Emergency Department (HOSPITAL_COMMUNITY)
Admission: EM | Admit: 2016-11-27 | Discharge: 2016-11-27 | Disposition: A | Discharge status: Home / Self Care | Payer: Medicare Other | Attending: Emergency Medicine | Admitting: Emergency Medicine

## 2016-11-27 DIAGNOSIS — B20 Human immunodeficiency virus [HIV] disease: Secondary | ICD-10-CM | POA: Diagnosis not present

## 2016-11-27 DIAGNOSIS — F1010 Alcohol abuse, uncomplicated: Secondary | ICD-10-CM

## 2016-11-27 DIAGNOSIS — J45909 Unspecified asthma, uncomplicated: Secondary | ICD-10-CM | POA: Diagnosis not present

## 2016-11-27 DIAGNOSIS — J449 Chronic obstructive pulmonary disease, unspecified: Secondary | ICD-10-CM | POA: Diagnosis not present

## 2016-11-27 DIAGNOSIS — F17210 Nicotine dependence, cigarettes, uncomplicated: Secondary | ICD-10-CM | POA: Diagnosis not present

## 2016-11-27 DIAGNOSIS — R002 Palpitations: Secondary | ICD-10-CM | POA: Diagnosis present

## 2016-11-27 DIAGNOSIS — F1410 Cocaine abuse, uncomplicated: Secondary | ICD-10-CM | POA: Diagnosis not present

## 2016-11-27 DIAGNOSIS — Z79899 Other long term (current) drug therapy: Secondary | ICD-10-CM | POA: Diagnosis not present

## 2016-11-27 DIAGNOSIS — F329 Major depressive disorder, single episode, unspecified: Secondary | ICD-10-CM | POA: Insufficient documentation

## 2016-11-27 DIAGNOSIS — Z7982 Long term (current) use of aspirin: Secondary | ICD-10-CM | POA: Diagnosis not present

## 2016-11-27 LAB — COMPREHENSIVE METABOLIC PANEL
ALT: 65 U/L — ABNORMAL HIGH (ref 17–63)
AST: 133 U/L — ABNORMAL HIGH (ref 15–41)
Albumin: 3.5 g/dL (ref 3.5–5.0)
Alkaline Phosphatase: 68 U/L (ref 38–126)
Anion gap: 7 (ref 5–15)
BUN: 13 mg/dL (ref 6–20)
CO2: 20 mmol/L — ABNORMAL LOW (ref 22–32)
Calcium: 8.8 mg/dL — ABNORMAL LOW (ref 8.9–10.3)
Chloride: 108 mmol/L (ref 101–111)
Creatinine, Ser: 0.91 mg/dL (ref 0.61–1.24)
GFR calc Af Amer: >60 mL/min (ref >60)
GFR calc non Af Amer: >60 mL/min (ref >60)
Glucose, Bld: 94 mg/dL (ref 65–99)
Potassium: 4.1 mmol/L (ref 3.5–5.1)
Sodium: 135 mmol/L (ref 135–145)
Total Bilirubin: 0.7 mg/dL (ref 0.3–1.2)
Total Protein: 8.4 g/dL — ABNORMAL HIGH (ref 6.5–8.1)

## 2016-11-27 LAB — CBC
HCT: 40 % (ref 39.0–52.0)
Hemoglobin: 13.4 g/dL (ref 13.0–17.0)
MCH: 32.4 pg (ref 26.0–34.0)
MCHC: 33.5 g/dL (ref 30.0–36.0)
MCV: 96.6 fL (ref 78.0–100.0)
Platelets: 127 10*3/uL — ABNORMAL LOW (ref 150–400)
RBC: 4.14 MIL/uL — ABNORMAL LOW (ref 4.22–5.81)
RDW: 12.9 % (ref 11.5–15.5)
WBC: 7.1 10*3/uL (ref 4.0–10.5)

## 2016-11-27 LAB — I-STAT TROPONIN, ED: Troponin i, poc: 0.01 ng/mL (ref 0.00–0.08)

## 2016-11-27 NOTE — ED Notes (Signed)
Pt refused to sign for paperwork.  

## 2016-11-27 NOTE — ED Notes (Signed)
Pt states he is having palpitations and is also drunk. Pt states this has been going on for 2 days now. Pt is also requesting an immediate [place] sandwich and ginger ale however he fell asleep shortly after making that request.
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
## 2016-11-27 NOTE — ED Triage Notes (Addendum)
Pt states he is having palpitations and is also drunk. Pt states this has been going on for 2 days now. Pt is also requesting an immediate turkey sandwich and ginger ale however he fell asleep shortly after making that request.  

## 2016-12-04 ENCOUNTER — Emergency Department (HOSPITAL_COMMUNITY)
Admission: EM | Admit: 2016-12-04 | Discharge: 2016-12-04 | Discharge status: Against Medical Advice | Payer: Medicare Other | Attending: Emergency Medicine | Admitting: Emergency Medicine

## 2016-12-04 ENCOUNTER — Encounter (HOSPITAL_COMMUNITY): Payer: Self-pay | Admitting: Emergency Medicine

## 2016-12-04 DIAGNOSIS — F22 Delusional disorders: Secondary | ICD-10-CM | POA: Diagnosis not present

## 2016-12-04 DIAGNOSIS — Z79899 Other long term (current) drug therapy: Secondary | ICD-10-CM | POA: Insufficient documentation

## 2016-12-04 DIAGNOSIS — F17210 Nicotine dependence, cigarettes, uncomplicated: Secondary | ICD-10-CM | POA: Insufficient documentation

## 2016-12-04 DIAGNOSIS — J45909 Unspecified asthma, uncomplicated: Secondary | ICD-10-CM | POA: Insufficient documentation

## 2016-12-04 DIAGNOSIS — J449 Chronic obstructive pulmonary disease, unspecified: Secondary | ICD-10-CM | POA: Insufficient documentation

## 2016-12-04 DIAGNOSIS — R55 Syncope and collapse: Secondary | ICD-10-CM | POA: Diagnosis present

## 2016-12-04 LAB — CBC WITH DIFFERENTIAL/PLATELET
Basophils Absolute: 0 10*3/uL (ref 0.0–0.1)
Basophils Relative: 0 %
Eosinophils Absolute: 0.2 10*3/uL (ref 0.0–0.7)
Eosinophils Relative: 3 %
HCT: 41.5 % (ref 39.0–52.0)
Hemoglobin: 14 g/dL (ref 13.0–17.0)
Lymphocytes Relative: 37 %
Lymphs Abs: 2.9 10*3/uL (ref 0.7–4.0)
MCH: 32.4 pg (ref 26.0–34.0)
MCHC: 33.7 g/dL (ref 30.0–36.0)
MCV: 96.1 fL (ref 78.0–100.0)
Monocytes Absolute: 0.7 10*3/uL (ref 0.1–1.0)
Monocytes Relative: 9 %
Neutro Abs: 4.1 10*3/uL (ref 1.7–7.7)
Neutrophils Relative %: 51 %
Platelets: 130 10*3/uL — ABNORMAL LOW (ref 150–400)
RBC: 4.32 MIL/uL (ref 4.22–5.81)
RDW: 12.5 % (ref 11.5–15.5)
WBC: 7.9 10*3/uL (ref 4.0–10.5)

## 2016-12-04 LAB — BASIC METABOLIC PANEL
Anion gap: 11 (ref 5–15)
BUN: 15 mg/dL (ref 6–20)
CO2: 18 mmol/L — ABNORMAL LOW (ref 22–32)
Calcium: 9.1 mg/dL (ref 8.9–10.3)
Chloride: 103 mmol/L (ref 101–111)
Creatinine, Ser: 1.44 mg/dL — ABNORMAL HIGH (ref 0.61–1.24)
GFR calc Af Amer: 58 mL/min — ABNORMAL LOW (ref >60)
GFR calc non Af Amer: 50 mL/min — ABNORMAL LOW (ref >60)
Glucose, Bld: 88 mg/dL (ref 65–99)
Potassium: 3.5 mmol/L (ref 3.5–5.1)
Sodium: 132 mmol/L — ABNORMAL LOW (ref 135–145)

## 2016-12-04 LAB — ETHANOL: Alcohol, Ethyl (B): 258 mg/dL — ABNORMAL HIGH (ref <5)
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
## 2016-12-04 NOTE — ED Notes (Signed)
Patient sitting on end of bed. Security in room. Loud. Obnoxious. Cursing.
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
## 2016-12-04 NOTE — ED Notes (Signed)
Pt leaving. EDP is okay with same.

## 2016-12-05 ENCOUNTER — Encounter (HOSPITAL_COMMUNITY): Payer: Self-pay | Admitting: Emergency Medicine

## 2016-12-05 ENCOUNTER — Emergency Department (HOSPITAL_COMMUNITY)
Admission: EM | Admit: 2016-12-05 | Discharge: 2016-12-05 | Disposition: A | Discharge status: Home / Self Care | Payer: Medicare Other | Attending: Emergency Medicine | Admitting: Emergency Medicine

## 2016-12-05 DIAGNOSIS — F319 Bipolar disorder, unspecified: Secondary | ICD-10-CM | POA: Insufficient documentation

## 2016-12-05 DIAGNOSIS — Z590 Homelessness: Secondary | ICD-10-CM | POA: Diagnosis not present

## 2016-12-05 DIAGNOSIS — Z79899 Other long term (current) drug therapy: Secondary | ICD-10-CM | POA: Insufficient documentation

## 2016-12-05 DIAGNOSIS — J449 Chronic obstructive pulmonary disease, unspecified: Secondary | ICD-10-CM | POA: Insufficient documentation

## 2016-12-05 DIAGNOSIS — J45909 Unspecified asthma, uncomplicated: Secondary | ICD-10-CM | POA: Diagnosis not present

## 2016-12-05 DIAGNOSIS — F259 Schizoaffective disorder, unspecified: Secondary | ICD-10-CM | POA: Insufficient documentation

## 2016-12-05 DIAGNOSIS — F17210 Nicotine dependence, cigarettes, uncomplicated: Secondary | ICD-10-CM | POA: Insufficient documentation

## 2016-12-05 DIAGNOSIS — B20 Human immunodeficiency virus [HIV] disease: Secondary | ICD-10-CM | POA: Insufficient documentation

## 2016-12-05 DIAGNOSIS — F1410 Cocaine abuse, uncomplicated: Secondary | ICD-10-CM | POA: Insufficient documentation

## 2016-12-05 DIAGNOSIS — F10120 Alcohol abuse with intoxication, uncomplicated: Secondary | ICD-10-CM | POA: Insufficient documentation

## 2016-12-05 DIAGNOSIS — B182 Chronic viral hepatitis C: Secondary | ICD-10-CM | POA: Insufficient documentation

## 2016-12-05 DIAGNOSIS — I10 Essential (primary) hypertension: Secondary | ICD-10-CM | POA: Diagnosis not present

## 2016-12-05 DIAGNOSIS — F10920 Alcohol use, unspecified with intoxication, uncomplicated: Secondary | ICD-10-CM

## 2016-12-05 NOTE — ED Notes (Signed)
Pt did not sign e-signature, verbalized understanding of DC instructions
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
## 2016-12-05 NOTE — ED Triage Notes (Signed)
Per [facility]: Pt found laying down in [employer] [place] lot, pt clearly intoxicated, using profane language and making inappropriate remarks toward staff, primarily male staff. Pt requesting [place] sandwich and ginger ale. He denies pain. NAD noted.

## 2016-12-05 NOTE — ED Notes (Signed)
Pt stood up to urinate and peed on floor and into trashcan. Sheets were wet as well so linen was changed. Pt escorted back to bed.

## 2016-12-13 ENCOUNTER — Encounter (HOSPITAL_COMMUNITY): Payer: Self-pay | Admitting: Emergency Medicine

## 2016-12-13 ENCOUNTER — Other Ambulatory Visit: Payer: Self-pay

## 2016-12-13 ENCOUNTER — Emergency Department (HOSPITAL_COMMUNITY)
Admission: EM | Admit: 2016-12-13 | Discharge: 2016-12-13 | Discharge status: Against Medical Advice | Payer: Medicare Other | Source: Home / Self Care

## 2016-12-13 ENCOUNTER — Emergency Department (HOSPITAL_COMMUNITY)
Admission: EM | Admit: 2016-12-13 | Discharge: 2016-12-13 | Payer: Medicare Other | Attending: Emergency Medicine | Admitting: Emergency Medicine

## 2016-12-13 DIAGNOSIS — F17210 Nicotine dependence, cigarettes, uncomplicated: Secondary | ICD-10-CM

## 2016-12-13 DIAGNOSIS — B20 Human immunodeficiency virus [HIV] disease: Secondary | ICD-10-CM | POA: Insufficient documentation

## 2016-12-13 DIAGNOSIS — F10929 Alcohol use, unspecified with intoxication, unspecified: Secondary | ICD-10-CM | POA: Diagnosis not present

## 2016-12-13 DIAGNOSIS — J449 Chronic obstructive pulmonary disease, unspecified: Secondary | ICD-10-CM

## 2016-12-13 DIAGNOSIS — Z79899 Other long term (current) drug therapy: Secondary | ICD-10-CM

## 2016-12-13 DIAGNOSIS — F10920 Alcohol use, unspecified with intoxication, uncomplicated: Secondary | ICD-10-CM | POA: Insufficient documentation

## 2016-12-13 DIAGNOSIS — F1910 Other psychoactive substance abuse, uncomplicated: Secondary | ICD-10-CM | POA: Insufficient documentation

## 2016-12-13 DIAGNOSIS — Z8614 Personal history of Methicillin resistant Staphylococcus aureus infection: Secondary | ICD-10-CM

## 2016-12-13 DIAGNOSIS — J45909 Unspecified asthma, uncomplicated: Secondary | ICD-10-CM | POA: Diagnosis not present

## 2016-12-13 DIAGNOSIS — R002 Palpitations: Secondary | ICD-10-CM | POA: Insufficient documentation

## 2016-12-13 DIAGNOSIS — B182 Chronic viral hepatitis C: Secondary | ICD-10-CM

## 2016-12-13 DIAGNOSIS — I10 Essential (primary) hypertension: Secondary | ICD-10-CM | POA: Diagnosis not present

## 2016-12-13 NOTE — ED Notes (Signed)
Pt was combative and disruptive in the lobby. Pt was escorted off the property by security. Pt ambulatory at time without difficulty

## 2016-12-13 NOTE — ED Triage Notes (Signed)
Per EMS bystander called related to pt outside intoxication laying down. Pt unsteady gait, slurring words, and yelling with triage.

## 2016-12-13 NOTE — ED Notes (Signed)
Per Sebastian AcheMatt Quick, charge RN place pt in lobby. Matt Quick assessed pt ambulatory status and verbalizes pt able to take few steps without falling. Pt placed in wheelchair and into lobby.
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
## 2016-12-13 NOTE — ED Notes (Signed)
ED Provider at bedside. 

## 2016-12-13 NOTE — ED Triage Notes (Signed)
Per EMS bystander called EMS related to patient yelling outside and being intoxicated.  Pt yelling at anyone he comes into contact with.  Refusing vital signs. Pt peed in the floor.  Pt refusing to be cooperative.

## 2016-12-13 NOTE — ED Notes (Signed)
Pt ambulatory and independent at discharge.  Pt verbalized understanding of discharge instructions.  

## 2016-12-28 ENCOUNTER — Emergency Department (HOSPITAL_COMMUNITY): Payer: Medicare Other

## 2016-12-28 ENCOUNTER — Encounter (HOSPITAL_COMMUNITY): Payer: Self-pay | Admitting: Emergency Medicine

## 2016-12-28 ENCOUNTER — Emergency Department (HOSPITAL_COMMUNITY)
Admission: EM | Admit: 2016-12-28 | Discharge: 2016-12-29 | Disposition: A | Discharge status: Home / Self Care | Payer: Medicare Other | Attending: Emergency Medicine | Admitting: Emergency Medicine

## 2016-12-28 DIAGNOSIS — R4182 Altered mental status, unspecified: Secondary | ICD-10-CM | POA: Diagnosis not present

## 2016-12-28 DIAGNOSIS — Y939 Activity, unspecified: Secondary | ICD-10-CM | POA: Diagnosis not present

## 2016-12-28 DIAGNOSIS — Y999 Unspecified external cause status: Secondary | ICD-10-CM | POA: Insufficient documentation

## 2016-12-28 DIAGNOSIS — F10920 Alcohol use, unspecified with intoxication, uncomplicated: Secondary | ICD-10-CM | POA: Diagnosis not present

## 2016-12-28 DIAGNOSIS — Y907 Blood alcohol level of 200-239 mg/100 ml: Secondary | ICD-10-CM | POA: Insufficient documentation

## 2016-12-28 DIAGNOSIS — Y929 Unspecified place or not applicable: Secondary | ICD-10-CM | POA: Insufficient documentation

## 2016-12-28 DIAGNOSIS — W1830XA Fall on same level, unspecified, initial encounter: Secondary | ICD-10-CM | POA: Insufficient documentation

## 2016-12-28 DIAGNOSIS — S0031XA Abrasion of nose, initial encounter: Secondary | ICD-10-CM | POA: Insufficient documentation

## 2016-12-28 LAB — COMPREHENSIVE METABOLIC PANEL
ALT: 50 U/L (ref 17–63)
AST: 69 U/L — ABNORMAL HIGH (ref 15–41)
Albumin: 3.6 g/dL (ref 3.5–5.0)
Alkaline Phosphatase: 71 U/L (ref 38–126)
Anion gap: 8 (ref 5–15)
BUN: 10 mg/dL (ref 6–20)
CO2: 25 mmol/L (ref 22–32)
Calcium: 9.3 mg/dL (ref 8.9–10.3)
Chloride: 107 mmol/L (ref 101–111)
Creatinine, Ser: 1.04 mg/dL (ref 0.61–1.24)
GFR calc Af Amer: >60 mL/min (ref >60)
GFR calc non Af Amer: >60 mL/min (ref >60)
Glucose, Bld: 119 mg/dL — ABNORMAL HIGH (ref 65–99)
Potassium: 3.8 mmol/L (ref 3.5–5.1)
Sodium: 140 mmol/L (ref 135–145)
Total Bilirubin: 0.8 mg/dL (ref 0.3–1.2)
Total Protein: 8.9 g/dL — ABNORMAL HIGH (ref 6.5–8.1)

## 2016-12-28 LAB — CBC
HCT: 42.5 % (ref 39.0–52.0)
Hemoglobin: 14.9 g/dL (ref 13.0–17.0)
MCH: 33 pg (ref 26.0–34.0)
MCHC: 35.1 g/dL (ref 30.0–36.0)
MCV: 94.2 fL (ref 78.0–100.0)
Platelets: UNDETERMINED 10*3/uL (ref 150–400)
RBC: 4.51 MIL/uL (ref 4.22–5.81)
RDW: 12.9 % (ref 11.5–15.5)
WBC: 6.3 10*3/uL (ref 4.0–10.5)

## 2016-12-28 LAB — ETHANOL: Alcohol, Ethyl (B): 209 mg/dL — ABNORMAL HIGH (ref <5)

## 2016-12-28 MED ORDER — SODIUM CHLORIDE 0.9 % IV BOLUS (SEPSIS)
1000.0000 mL | Freq: Once | INTRAVENOUS | Status: AC
  Administered 2016-12-28: 1000 mL via INTRAVENOUS

## 2016-12-28 NOTE — ED Notes (Signed)
Pt walked up to nurses' station asking for a [place] sandwich and ginger ale.  Pt's speech is slurred and gait remains unsteady.  Pt helped back into bed.  Once in bed, Pt reported he needed to urinate.  Given a urinal and NT provided privacy.  Sandwich and ginger ale at bedside.  Pt fell back to sleep while attempting to use urinal.

## 2016-12-28 NOTE — ED Notes (Signed)
Patient sleeping in wheelchair out in lobby.

## 2016-12-28 NOTE — ED Notes (Signed)
Pt walked to the bathroom.  Still very unsteady

## 2016-12-28 NOTE — ED Triage Notes (Signed)
Patient brought in with [facility] for fall due to intoxication.  Patient aggressive and trying to hit EMS staff as well as ED staff when putting patient in wheelchair and placing in lobby.

## 2016-12-28 NOTE — ED Triage Notes (Signed)
Per EMS. Pt has been drinking etoh. Went to homeless shelter and became combative. EMS called because pt had a fall. Pt has abrasions to nose. Was placed in C-collar by EMS. Pt combative with EMS.
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
## 2016-12-29 ENCOUNTER — Emergency Department (HOSPITAL_COMMUNITY)
Admission: EM | Admit: 2016-12-29 | Discharge: 2016-12-29 | Disposition: A | Discharge status: Home / Self Care | Payer: Medicare Other | Source: Home / Self Care | Attending: Emergency Medicine | Admitting: Emergency Medicine

## 2016-12-29 ENCOUNTER — Encounter (HOSPITAL_COMMUNITY): Payer: Self-pay | Admitting: Emergency Medicine

## 2016-12-29 DIAGNOSIS — B182 Chronic viral hepatitis C: Secondary | ICD-10-CM

## 2016-12-29 DIAGNOSIS — I10 Essential (primary) hypertension: Secondary | ICD-10-CM

## 2016-12-29 DIAGNOSIS — F10129 Alcohol abuse with intoxication, unspecified: Secondary | ICD-10-CM

## 2016-12-29 DIAGNOSIS — B20 Human immunodeficiency virus [HIV] disease: Secondary | ICD-10-CM

## 2016-12-29 DIAGNOSIS — F10920 Alcohol use, unspecified with intoxication, uncomplicated: Secondary | ICD-10-CM

## 2016-12-29 DIAGNOSIS — J449 Chronic obstructive pulmonary disease, unspecified: Secondary | ICD-10-CM

## 2016-12-29 DIAGNOSIS — Z79899 Other long term (current) drug therapy: Secondary | ICD-10-CM | POA: Insufficient documentation

## 2016-12-29 DIAGNOSIS — F17210 Nicotine dependence, cigarettes, uncomplicated: Secondary | ICD-10-CM

## 2016-12-29 LAB — CBC WITH DIFFERENTIAL/PLATELET
Basophils Absolute: 0 10*3/uL (ref 0.0–0.1)
Basophils Relative: 0 %
Eosinophils Absolute: 0.2 10*3/uL (ref 0.0–0.7)
Eosinophils Relative: 3 %
HCT: 39 % (ref 39.0–52.0)
Hemoglobin: 13.2 g/dL (ref 13.0–17.0)
Lymphocytes Relative: 34 %
Lymphs Abs: 2.6 10*3/uL (ref 0.7–4.0)
MCH: 32.3 pg (ref 26.0–34.0)
MCHC: 33.8 g/dL (ref 30.0–36.0)
MCV: 95.4 fL (ref 78.0–100.0)
Monocytes Absolute: 0.4 10*3/uL (ref 0.1–1.0)
Monocytes Relative: 5 %
Neutro Abs: 4.3 10*3/uL (ref 1.7–7.7)
Neutrophils Relative %: 58 %
Platelets: UNDETERMINED 10*3/uL (ref 150–400)
RBC: 4.09 MIL/uL — ABNORMAL LOW (ref 4.22–5.81)
RDW: 12.9 % (ref 11.5–15.5)
WBC: 7.5 10*3/uL (ref 4.0–10.5)

## 2016-12-29 LAB — COMPREHENSIVE METABOLIC PANEL
ALT: 49 U/L (ref 17–63)
AST: 67 U/L — ABNORMAL HIGH (ref 15–41)
Albumin: 3.4 g/dL — ABNORMAL LOW (ref 3.5–5.0)
Alkaline Phosphatase: 66 U/L (ref 38–126)
Anion gap: 6 (ref 5–15)
BUN: 9 mg/dL (ref 6–20)
CO2: 26 mmol/L (ref 22–32)
Calcium: 8.8 mg/dL — ABNORMAL LOW (ref 8.9–10.3)
Chloride: 111 mmol/L (ref 101–111)
Creatinine, Ser: 1 mg/dL (ref 0.61–1.24)
GFR calc Af Amer: >60 mL/min (ref >60)
GFR calc non Af Amer: >60 mL/min (ref >60)
Glucose, Bld: 94 mg/dL (ref 65–99)
Potassium: 3.6 mmol/L (ref 3.5–5.1)
Sodium: 143 mmol/L (ref 135–145)
Total Bilirubin: 0.6 mg/dL (ref 0.3–1.2)
Total Protein: 8.2 g/dL — ABNORMAL HIGH (ref 6.5–8.1)

## 2016-12-29 LAB — ETHANOL: Alcohol, Ethyl (B): 265 mg/dL — ABNORMAL HIGH (ref <5)

## 2016-12-29 NOTE — ED Triage Notes (Signed)
Per EMS bystander called EMS because pt wanted to go to hospital; denies complaint with EMS arrival; pt ETOH on board.
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
## 2016-12-29 NOTE — Discharge Instructions (Signed)
You were seen here today for alcohol intoxication. Please follow-up with her primary care provider this week to discuss programs stop using alcohol. If you develop worsening or new concerning symptoms you can return to the emergency department for re-evaluation.

## 2016-12-30 ENCOUNTER — Encounter (HOSPITAL_COMMUNITY): Payer: Self-pay | Admitting: Nurse Practitioner

## 2016-12-30 ENCOUNTER — Emergency Department (HOSPITAL_COMMUNITY)
Admission: EM | Admit: 2016-12-30 | Discharge: 2016-12-31 | Disposition: A | Discharge status: Home / Self Care | Payer: Medicare Other | Attending: Emergency Medicine | Admitting: Emergency Medicine

## 2016-12-30 DIAGNOSIS — F10920 Alcohol use, unspecified with intoxication, uncomplicated: Secondary | ICD-10-CM | POA: Diagnosis present

## 2016-12-30 DIAGNOSIS — J449 Chronic obstructive pulmonary disease, unspecified: Secondary | ICD-10-CM | POA: Insufficient documentation

## 2016-12-30 DIAGNOSIS — F17210 Nicotine dependence, cigarettes, uncomplicated: Secondary | ICD-10-CM | POA: Insufficient documentation

## 2016-12-30 DIAGNOSIS — Z79899 Other long term (current) drug therapy: Secondary | ICD-10-CM | POA: Insufficient documentation

## 2016-12-30 DIAGNOSIS — J45909 Unspecified asthma, uncomplicated: Secondary | ICD-10-CM | POA: Insufficient documentation

## 2016-12-30 DIAGNOSIS — I10 Essential (primary) hypertension: Secondary | ICD-10-CM | POA: Insufficient documentation

## 2016-12-30 NOTE — ED Notes (Signed)
Bed: WTR9 Expected date:  Expected time:  Means of arrival:  Comments: 

## 2016-12-30 NOTE — ED Triage Notes (Signed)
Pt has been presented by [facility] for alcohol intoxication. Pt has no complaints, requesting a sandwich.
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
## 2016-12-31 NOTE — ED Notes (Signed)
PT DISCHARGED. INSTRUCTIONS GIVEN. AAOX4. PT IN NO APPARENT DISTRESS OR PAIN. THE OPPORTUNITY TO ASK QUESTIONS WAS PROVIDED. 

## 2017-01-18 ENCOUNTER — Other Ambulatory Visit: Payer: Self-pay

## 2017-01-18 ENCOUNTER — Other Ambulatory Visit: Payer: Self-pay | Admitting: *Deleted

## 2017-01-18 DIAGNOSIS — B20 Human immunodeficiency virus [HIV] disease: Secondary | ICD-10-CM

## 2017-01-20 ENCOUNTER — Encounter (HOSPITAL_COMMUNITY): Payer: Self-pay

## 2017-01-20 ENCOUNTER — Emergency Department (HOSPITAL_COMMUNITY)
Admission: EM | Admit: 2017-01-20 | Discharge: 2017-01-20 | Disposition: A | Discharge status: Home / Self Care | Payer: Medicare Other | Attending: Emergency Medicine | Admitting: Emergency Medicine

## 2017-01-20 DIAGNOSIS — Z79899 Other long term (current) drug therapy: Secondary | ICD-10-CM | POA: Diagnosis not present

## 2017-01-20 DIAGNOSIS — F10920 Alcohol use, unspecified with intoxication, uncomplicated: Secondary | ICD-10-CM | POA: Insufficient documentation

## 2017-01-20 DIAGNOSIS — J449 Chronic obstructive pulmonary disease, unspecified: Secondary | ICD-10-CM | POA: Diagnosis not present

## 2017-01-20 DIAGNOSIS — I491 Atrial premature depolarization: Secondary | ICD-10-CM | POA: Diagnosis not present

## 2017-01-20 DIAGNOSIS — J45909 Unspecified asthma, uncomplicated: Secondary | ICD-10-CM | POA: Diagnosis not present

## 2017-01-20 DIAGNOSIS — F17210 Nicotine dependence, cigarettes, uncomplicated: Secondary | ICD-10-CM | POA: Diagnosis not present

## 2017-01-20 NOTE — ED Triage Notes (Signed)
Pt arrived via EMS. Pt waved down [employer] from the street [facility] No [facility] or falls are noted.   BP 142/68  HR 84

## 2017-01-20 NOTE — ED Notes (Signed)
Pt states he hates white people and is cursing. Security called, unable to obtain v/s

## 2017-01-20 NOTE — ED Notes (Signed)
Pt ambulated from registration desk to restroom and back
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
## 2017-01-23 ENCOUNTER — Emergency Department (HOSPITAL_COMMUNITY)
Admission: EM | Admit: 2017-01-23 | Discharge: 2017-01-23 | Disposition: A | Discharge status: Home / Self Care | Payer: Medicare Other | Attending: Emergency Medicine | Admitting: Emergency Medicine

## 2017-01-23 ENCOUNTER — Encounter (HOSPITAL_COMMUNITY): Payer: Self-pay

## 2017-01-23 DIAGNOSIS — Z79899 Other long term (current) drug therapy: Secondary | ICD-10-CM | POA: Insufficient documentation

## 2017-01-23 DIAGNOSIS — F17210 Nicotine dependence, cigarettes, uncomplicated: Secondary | ICD-10-CM | POA: Insufficient documentation

## 2017-01-23 DIAGNOSIS — J45909 Unspecified asthma, uncomplicated: Secondary | ICD-10-CM | POA: Insufficient documentation

## 2017-01-23 DIAGNOSIS — J449 Chronic obstructive pulmonary disease, unspecified: Secondary | ICD-10-CM | POA: Insufficient documentation

## 2017-01-23 DIAGNOSIS — F10920 Alcohol use, unspecified with intoxication, uncomplicated: Secondary | ICD-10-CM | POA: Insufficient documentation

## 2017-01-23 DIAGNOSIS — I10 Essential (primary) hypertension: Secondary | ICD-10-CM | POA: Diagnosis not present

## 2017-01-23 NOTE — ED Notes (Signed)
Pt presented by EMS and [employer] for alcohol intoxication. Pt voices no other complaints at this time.

## 2017-01-23 NOTE — ED Notes (Signed)
Bed: [facility]17 Expected date:  Expected time:  Means of arrival:  Comments: EMS 61 yo male from a gas station-right lower abdominal pain x 8 days-intoxicated and using cocaine-in custody due to aggression
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
## 2017-01-23 NOTE — ED Notes (Signed)
Patient currently sleeping and in no distress with [employer] officer at bedside
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
## 2017-01-23 NOTE — ED Notes (Signed)
Patient ambulatory to bathroom-aggressive with staff-patient wants to leave

## 2017-01-23 NOTE — ED Notes (Signed)
Patient left with [employer] ambulatory in no distress after eating a sandwich and drinking a soda

## 2017-02-01 ENCOUNTER — Ambulatory Visit: Payer: Self-pay | Admitting: Infectious Disease

## 2017-02-16 ENCOUNTER — Other Ambulatory Visit: Payer: Self-pay | Admitting: Infectious Disease

## 2017-03-15 ENCOUNTER — Other Ambulatory Visit: Payer: Medicare Other

## 2017-03-15 DIAGNOSIS — Z113 Encounter for screening for infections with a predominantly sexual mode of transmission: Secondary | ICD-10-CM

## 2017-03-15 DIAGNOSIS — B20 Human immunodeficiency virus [HIV] disease: Secondary | ICD-10-CM

## 2017-03-15 DIAGNOSIS — Z79899 Other long term (current) drug therapy: Secondary | ICD-10-CM

## 2017-03-15 LAB — COMPLETE METABOLIC PANEL WITH GFR
AG Ratio: 0.7 (calc) — ABNORMAL LOW (ref 1.0–2.5)
ALT: 33 U/L (ref 9–46)
AST: 46 U/L — ABNORMAL HIGH (ref 10–35)
Albumin: 3.4 g/dL — ABNORMAL LOW (ref 3.6–5.1)
Alkaline phosphatase (APISO): 63 U/L (ref 40–115)
BUN: 9 mg/dL (ref 7–25)
CO2: 25 mmol/L (ref 20–32)
Calcium: 9 mg/dL (ref 8.6–10.3)
Chloride: 105 mmol/L (ref 98–110)
Creat: 0.96 mg/dL (ref 0.70–1.25)
GFR, Est African American: 97 mL/min/{1.73_m2} (ref >=60)
GFR, Est Non African American: 84 mL/min/{1.73_m2} (ref >=60)
Globulin: 4.8 g/dL (calc) — ABNORMAL HIGH (ref 1.9–3.7)
Glucose, Bld: 152 mg/dL — ABNORMAL HIGH (ref 65–99)
Potassium: 3.8 mmol/L (ref 3.5–5.3)
Sodium: 137 mmol/L (ref 135–146)
Total Bilirubin: 0.6 mg/dL (ref 0.2–1.2)
Total Protein: 8.2 g/dL — ABNORMAL HIGH (ref 6.1–8.1)

## 2017-03-15 LAB — CBC WITH DIFFERENTIAL/PLATELET
Basophils Absolute: 21 cells/uL (ref 0–200)
Basophils Relative: 0.5 %
Eosinophils Absolute: 180 cells/uL (ref 15–500)
Eosinophils Relative: 4.4 %
HCT: 41.8 % (ref 38.5–50.0)
Hemoglobin: 13.8 g/dL (ref 13.2–17.1)
Lymphs Abs: 1074 cells/uL (ref 850–3900)
MCH: 31.6 pg (ref 27.0–33.0)
MCHC: 33 g/dL (ref 32.0–36.0)
MCV: 95.7 fL (ref 80.0–100.0)
Monocytes Relative: 10.8 %
Neutro Abs: 2382 cells/uL (ref 1500–7800)
Neutrophils Relative %: 58.1 %
RBC: 4.37 10*6/uL (ref 4.20–5.80)
RDW: 11.3 % (ref 11.0–15.0)
Total Lymphocyte: 26.2 %
WBC mixed population: 443 cells/uL (ref 200–950)
WBC: 4.1 10*3/uL (ref 3.8–10.8)

## 2017-03-16 LAB — T-HELPER CELL (CD4) - (RCID CLINIC ONLY)
CD4 % Helper T Cell: 11 % — ABNORMAL LOW (ref 33–55)
CD4 T Cell Abs: 140 /uL — ABNORMAL LOW (ref 400–2700)

## 2017-03-16 LAB — LIPID PANEL
Cholesterol: 125 mg/dL (ref <200)
HDL: 39 mg/dL — ABNORMAL LOW (ref >40)
LDL Cholesterol (Calc): 67 mg/dL (calc)
Non-HDL Cholesterol (Calc): 86 mg/dL (calc) (ref <130)
Total CHOL/HDL Ratio: 3.2 (calc) (ref <5.0)
Triglycerides: 100 mg/dL (ref <150)

## 2017-03-16 LAB — RPR: RPR Ser Ql: NONREACTIVE

## 2017-03-17 LAB — HIV-1 RNA QUANT-NO REFLEX-BLD
HIV 1 RNA Quant: 72 copies/mL — ABNORMAL HIGH
HIV-1 RNA Quant, Log: 1.86 Log copies/mL — ABNORMAL HIGH

## 2017-03-23 ENCOUNTER — Encounter (HOSPITAL_COMMUNITY): Payer: Self-pay | Admitting: Nurse Practitioner

## 2017-03-23 ENCOUNTER — Emergency Department (HOSPITAL_COMMUNITY)
Admission: EM | Admit: 2017-03-23 | Discharge: 2017-03-23 | Disposition: A | Discharge status: Home / Self Care | Payer: Medicare Other | Attending: Emergency Medicine | Admitting: Emergency Medicine

## 2017-03-23 DIAGNOSIS — I10 Essential (primary) hypertension: Secondary | ICD-10-CM | POA: Insufficient documentation

## 2017-03-23 DIAGNOSIS — Z79899 Other long term (current) drug therapy: Secondary | ICD-10-CM | POA: Diagnosis not present

## 2017-03-23 DIAGNOSIS — J449 Chronic obstructive pulmonary disease, unspecified: Secondary | ICD-10-CM | POA: Diagnosis not present

## 2017-03-23 DIAGNOSIS — F10920 Alcohol use, unspecified with intoxication, uncomplicated: Secondary | ICD-10-CM | POA: Insufficient documentation

## 2017-03-23 DIAGNOSIS — F17210 Nicotine dependence, cigarettes, uncomplicated: Secondary | ICD-10-CM | POA: Diagnosis not present

## 2017-03-23 DIAGNOSIS — Z21 Asymptomatic human immunodeficiency virus [HIV] infection status: Secondary | ICD-10-CM | POA: Insufficient documentation

## 2017-03-23 DIAGNOSIS — R4182 Altered mental status, unspecified: Secondary | ICD-10-CM | POA: Diagnosis present

## 2017-03-23 NOTE — ED Triage Notes (Signed)
Patient was sleeping on the sidewalk and bystander called EMS. Patient wanted to come to the ER to get help.

## 2017-03-23 NOTE — ED Notes (Signed)
Bed: [facility] Expected date:  Expected time:  Means of arrival:  Comments: 

## 2017-03-23 NOTE — Discharge Instructions (Signed)
Contact a health care provider if: °You are not able to take your medicines as told. °Your symptoms get worse. °You return to drinking alcohol (relapse) and your symptoms get worse. °
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
## 2017-03-27 ENCOUNTER — Encounter (HOSPITAL_COMMUNITY): Payer: Self-pay | Admitting: Emergency Medicine

## 2017-03-27 DIAGNOSIS — F17210 Nicotine dependence, cigarettes, uncomplicated: Secondary | ICD-10-CM | POA: Insufficient documentation

## 2017-03-27 DIAGNOSIS — Z79899 Other long term (current) drug therapy: Secondary | ICD-10-CM | POA: Diagnosis not present

## 2017-03-27 DIAGNOSIS — F1010 Alcohol abuse, uncomplicated: Secondary | ICD-10-CM | POA: Diagnosis not present

## 2017-03-27 DIAGNOSIS — B20 Human immunodeficiency virus [HIV] disease: Secondary | ICD-10-CM | POA: Diagnosis not present

## 2017-03-27 DIAGNOSIS — J449 Chronic obstructive pulmonary disease, unspecified: Secondary | ICD-10-CM | POA: Diagnosis not present

## 2017-03-27 NOTE — ED Triage Notes (Signed)
Patient reports that he stays at a group home and was told that he needed to go to detox for ETOH. patient reports that he drank from Thursday to yesterday and didn't drink any ETOH today due to "my money didn't come in or I would have".  Drinks several cans beer daily.

## 2017-03-28 ENCOUNTER — Emergency Department (HOSPITAL_COMMUNITY)
Admission: EM | Admit: 2017-03-28 | Discharge: 2017-03-28 | Disposition: A | Discharge status: Home / Self Care | Payer: Medicare Other | Attending: Emergency Medicine | Admitting: Emergency Medicine

## 2017-03-28 DIAGNOSIS — F1010 Alcohol abuse, uncomplicated: Secondary | ICD-10-CM

## 2017-03-28 NOTE — Discharge Instructions (Signed)
Return here as needed.  Follow-up with the resources provided for detox.
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
## 2017-03-28 NOTE — ED Notes (Signed)
Bed: [facility] Expected date:  Expected time:  Means of arrival:  Comments: 

## 2017-03-28 NOTE — ED Notes (Signed)
Pt eating breakfast tray. Will discharge patient after he is done with food.

## 2017-04-05 ENCOUNTER — Ambulatory Visit: Payer: Self-pay | Admitting: Infectious Disease

## 2017-04-25 ENCOUNTER — Ambulatory Visit: Payer: Self-pay | Admitting: Infectious Disease

## 2017-04-27 ENCOUNTER — Encounter (HOSPITAL_COMMUNITY): Payer: Self-pay | Admitting: Emergency Medicine

## 2017-04-27 ENCOUNTER — Emergency Department (HOSPITAL_COMMUNITY)
Admission: EM | Admit: 2017-04-27 | Discharge: 2017-04-28 | Disposition: A | Discharge status: Home / Self Care | Payer: Medicare Other | Source: Home / Self Care | Attending: Emergency Medicine | Admitting: Emergency Medicine

## 2017-04-27 DIAGNOSIS — Z888 Allergy status to other drugs, medicaments and biological substances status: Secondary | ICD-10-CM | POA: Diagnosis not present

## 2017-04-27 DIAGNOSIS — J45909 Unspecified asthma, uncomplicated: Secondary | ICD-10-CM

## 2017-04-27 DIAGNOSIS — B20 Human immunodeficiency virus [HIV] disease: Secondary | ICD-10-CM

## 2017-04-27 DIAGNOSIS — Z79899 Other long term (current) drug therapy: Secondary | ICD-10-CM

## 2017-04-27 DIAGNOSIS — J449 Chronic obstructive pulmonary disease, unspecified: Secondary | ICD-10-CM | POA: Insufficient documentation

## 2017-04-27 DIAGNOSIS — F332 Major depressive disorder, recurrent severe without psychotic features: Secondary | ICD-10-CM | POA: Diagnosis not present

## 2017-04-27 DIAGNOSIS — F10120 Alcohol abuse with intoxication, uncomplicated: Secondary | ICD-10-CM

## 2017-04-27 DIAGNOSIS — F10920 Alcohol use, unspecified with intoxication, uncomplicated: Secondary | ICD-10-CM

## 2017-04-27 DIAGNOSIS — F17210 Nicotine dependence, cigarettes, uncomplicated: Secondary | ICD-10-CM

## 2017-04-27 DIAGNOSIS — Z8619 Personal history of other infectious and parasitic diseases: Secondary | ICD-10-CM | POA: Diagnosis not present

## 2017-04-27 DIAGNOSIS — I10 Essential (primary) hypertension: Secondary | ICD-10-CM | POA: Insufficient documentation

## 2017-04-27 DIAGNOSIS — Z21 Asymptomatic human immunodeficiency virus [HIV] infection status: Secondary | ICD-10-CM | POA: Diagnosis not present

## 2017-04-27 DIAGNOSIS — Z881 Allergy status to other antibiotic agents status: Secondary | ICD-10-CM | POA: Diagnosis not present

## 2017-04-27 DIAGNOSIS — Z8249 Family history of ischemic heart disease and other diseases of the circulatory system: Secondary | ICD-10-CM | POA: Diagnosis not present

## 2017-04-27 LAB — COMPREHENSIVE METABOLIC PANEL
ALT: 40 U/L (ref 17–63)
AST: 63 U/L — ABNORMAL HIGH (ref 15–41)
Albumin: 3.6 g/dL (ref 3.5–5.0)
Alkaline Phosphatase: 68 U/L (ref 38–126)
Anion gap: 7 (ref 5–15)
BUN: 14 mg/dL (ref 6–20)
CO2: 28 mmol/L (ref 22–32)
Calcium: 8.9 mg/dL (ref 8.9–10.3)
Chloride: 106 mmol/L (ref 101–111)
Creatinine, Ser: 0.89 mg/dL (ref 0.61–1.24)
GFR calc Af Amer: >60 mL/min (ref >60)
GFR calc non Af Amer: >60 mL/min (ref >60)
Glucose, Bld: 96 mg/dL (ref 65–99)
Potassium: 3.8 mmol/L (ref 3.5–5.1)
Sodium: 141 mmol/L (ref 135–145)
Total Bilirubin: 0.8 mg/dL (ref 0.3–1.2)
Total Protein: 8.8 g/dL — ABNORMAL HIGH (ref 6.5–8.1)

## 2017-04-27 LAB — CBC WITH DIFFERENTIAL/PLATELET
Basophils Absolute: 0 10*3/uL (ref 0.0–0.1)
Basophils Relative: 0 %
Eosinophils Absolute: 0.2 10*3/uL (ref 0.0–0.7)
Eosinophils Relative: 3 %
HCT: 41.7 % (ref 39.0–52.0)
Hemoglobin: 14 g/dL (ref 13.0–17.0)
Lymphocytes Relative: 28 %
Lymphs Abs: 2.1 10*3/uL (ref 0.7–4.0)
MCH: 31.7 pg (ref 26.0–34.0)
MCHC: 33.6 g/dL (ref 30.0–36.0)
MCV: 94.6 fL (ref 78.0–100.0)
Monocytes Absolute: 0.4 10*3/uL (ref 0.1–1.0)
Monocytes Relative: 6 %
Neutro Abs: 4.8 10*3/uL (ref 1.7–7.7)
Neutrophils Relative %: 63 %
Platelets: 135 10*3/uL — ABNORMAL LOW (ref 150–400)
RBC: 4.41 MIL/uL (ref 4.22–5.81)
RDW: 13.5 % (ref 11.5–15.5)
WBC: 7.5 10*3/uL (ref 4.0–10.5)

## 2017-04-27 LAB — SALICYLATE LEVEL: Salicylate Lvl: <7 mg/dL (ref 2.8–30.0)

## 2017-04-27 LAB — ETHANOL: Alcohol, Ethyl (B): 300 mg/dL — ABNORMAL HIGH (ref <10)

## 2017-04-27 LAB — ACETAMINOPHEN LEVEL: Acetaminophen (Tylenol), Serum: <10 ug/mL — ABNORMAL LOW (ref 10–30)

## 2017-04-27 LAB — RAPID URINE DRUG SCREEN, HOSP PERFORMED
Amphetamines: NOT DETECTED
Barbiturates: NOT DETECTED
Benzodiazepines: NOT DETECTED
Cocaine: NOT DETECTED
Opiates: NOT DETECTED
Tetrahydrocannabinol: NOT DETECTED

## 2017-04-27 NOTE — ED Notes (Signed)
Pt was able to be changed out of his wet clothes. He was able to stand. He was able to follow commands. He was CAOx4 as far as could be witnessed, pt is very uncooperative. He had movement of all extremities. Grip strength was equal. Pupils are round, reactive. He stated that he had alcohol tonight but nothing else.
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
## 2017-04-27 NOTE — ED Notes (Signed)
Pt has urinated on himself. Waiting for an open room to clean pt up. Currently covered with a blanket.
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
## 2017-04-27 NOTE — ED Notes (Addendum)
Pt was able to be aroused. Pt stated that he was "sick." Pt would not elaborate any more. He kept repeating that he was sick. Pt reached out and grabbed this RNs radio when RN asked other questions. Pt appeared annoyed with questions. Pt smells of ETOH. Once pt is unstimulated he returns to sleep.

## 2017-04-28 ENCOUNTER — Ambulatory Visit (HOSPITAL_COMMUNITY)
Admission: RE | Admit: 2017-04-28 | Discharge: 2017-04-28 | Disposition: A | Discharge status: Home / Self Care | Payer: Medicare Other | Attending: Psychiatry | Admitting: Psychiatry

## 2017-04-28 DIAGNOSIS — F332 Major depressive disorder, recurrent severe without psychotic features: Secondary | ICD-10-CM | POA: Insufficient documentation

## 2017-04-28 DIAGNOSIS — Z8249 Family history of ischemic heart disease and other diseases of the circulatory system: Secondary | ICD-10-CM | POA: Insufficient documentation

## 2017-04-28 DIAGNOSIS — J449 Chronic obstructive pulmonary disease, unspecified: Secondary | ICD-10-CM | POA: Insufficient documentation

## 2017-04-28 DIAGNOSIS — F17210 Nicotine dependence, cigarettes, uncomplicated: Secondary | ICD-10-CM | POA: Insufficient documentation

## 2017-04-28 DIAGNOSIS — Z881 Allergy status to other antibiotic agents status: Secondary | ICD-10-CM | POA: Insufficient documentation

## 2017-04-28 DIAGNOSIS — Z8619 Personal history of other infectious and parasitic diseases: Secondary | ICD-10-CM | POA: Insufficient documentation

## 2017-04-28 DIAGNOSIS — Z888 Allergy status to other drugs, medicaments and biological substances status: Secondary | ICD-10-CM | POA: Insufficient documentation

## 2017-04-28 DIAGNOSIS — Z21 Asymptomatic human immunodeficiency virus [HIV] infection status: Secondary | ICD-10-CM | POA: Insufficient documentation

## 2017-04-28 NOTE — H&P (Signed)
Behavioral Health Medical Screening Exam  [name] is an 61 y.o. male [facility].  [facility] and walked across street with complaint that he was feeling a little off.  "I won't feeling right.  I was feeling a little off.  I sometimes have suicidal thoughts but not now.  I hear voices sometimes and I take Risperdal.  Patient states that [facility].  At this time patient denies suicidal/homicidal/self-harm ideation, psychosis, and paranoia.    Total Time spent with patient: 45 minutes  Psychiatric Specialty Exam: Physical Exam  Constitutional: He is oriented to person, place, and time.  Neck: Normal range of motion. Neck supple.  Respiratory: Effort normal.  Musculoskeletal: Normal range of motion.  Neurological: He is alert and oriented to person, place, and time.  Skin: Skin is warm and dry.  Psychiatric: He has a normal mood and affect. His speech is normal and behavior is normal. Thought content normal. Cognition and memory are normal. He expresses impulsivity.    ROS  Blood pressure (!) 142/100, pulse 78, temperature 98.3 F (36.8 C), resp. rate 18, SpO2 98 %.There is no height or weight on file to calculate BMI.  General Appearance: Casual  Eye Contact:  Good  Speech:  Clear and Coherent and Normal Rate  Volume:  Normal  Mood:  Pleasant, joking  Affect:  Appropriate  Thought Process:  Coherent and Goal Directed  Orientation:  Full (Time, Place, and Person)  Thought Content:  Denies hallucinations, delusions, and paranoia  Suicidal Thoughts:  No  Homicidal Thoughts:  No  Memory:  Immediate;   Good Recent;   Good Remote;   Good  Judgement:  Fair  Insight:  Present  Psychomotor Activity:  Normal  Concentration: Concentration: Good and Attention Span: Good  Recall:  Good  Fund of Knowledge:Fair  Language: Good  Akathisia:  No  Handed:  Right  AIMS (if indicated):     Assets:   Communication Skills Desire for Improvement Housing Social Support  Sleep:       Musculoskeletal: Strength & Muscle Tone: within normal limits Gait & Station: normal Patient leans: N/A  Blood pressure (!) 142/100, pulse 78, temperature 98.3 F (36.8 C), resp. rate 18, SpO2 98 %.  Recommendations:  Follow up with [facility].  Follow up with PCP related to elevated blood pressure  Based on my evaluation the patient does not appear to have an emergency medical condition.   Disposition: No evidence of imminent risk to self or others at present.   Patient does not meet criteria for psychiatric inpatient admission.   Rankin, Shuvon, NP 04/28/2017, 3:22 PM

## 2017-04-28 NOTE — ED Notes (Signed)
Pt brought ginger ale and cheese with crackers.

## 2017-04-28 NOTE — [facility] Counselor (Signed)
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 TTS writer attempted to contact Mr. Trent at [phone] then at [phone]. The phone just rang on both numbers no answer. TTS did not leave a message.    Patient provided with a bus pass.

## 2017-04-28 NOTE — Discharge Instructions (Addendum)
Follow up with a center if you want to quit drinking.

## 2017-04-28 NOTE — ED Provider Notes (Signed)
12:00 AM  Assumed care from Dr. Adela LankFloyd.  Patient is a 61 year old male who presents today with alcohol intoxication.  Alcohol level was 300 around 10:30 PM.  Other workup has been unremarkable.  He will continue to be monitored until clinically sober.  3:30 AM  Pt is now awake, alert and appropriate.  He is eating and drinking without difficulty.  Able to ambulate without assistance.  He seems clinically sober.  He states he can call a friend to come pick him up.  He has no complaints at this time.   At this time, I do not feel there is any life-threatening condition present. I have reviewed and discussed all results (EKG, imaging, lab, urine as appropriate) and exam findings with patient/family. I have reviewed nursing notes and appropriate previous records.  I feel the patient is safe to be discharged home without further emergent workup and can continue workup as an outpatient as needed. Discussed usual and customary return precautions. Patient/family verbalize understanding and are comfortable with this plan.  Outpatient follow-up has been provided if needed. All questions have been answered.    Asianna Brundage, Layla MawKristen N, DO 04/28/17 [phone]

## 2017-04-28 NOTE — ED Notes (Signed)
Pt was able to ambulate when staff changed him out of his urine soaked clothing. He was able to ambulate without assistance. At times he stumbled slightly on his feet but maintained.
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
## 2017-05-03 ENCOUNTER — Other Ambulatory Visit: Payer: Self-pay

## 2017-05-03 ENCOUNTER — Encounter (HOSPITAL_COMMUNITY): Payer: Self-pay | Admitting: *Deleted

## 2017-05-03 ENCOUNTER — Emergency Department (HOSPITAL_COMMUNITY)
Admission: EM | Admit: 2017-05-03 | Discharge: 2017-05-03 | Disposition: A | Discharge status: Home / Self Care | Payer: Medicare Other | Attending: Emergency Medicine | Admitting: Emergency Medicine

## 2017-05-03 DIAGNOSIS — Z5900 Homelessness unspecified: Secondary | ICD-10-CM

## 2017-05-03 DIAGNOSIS — J449 Chronic obstructive pulmonary disease, unspecified: Secondary | ICD-10-CM | POA: Insufficient documentation

## 2017-05-03 DIAGNOSIS — F17210 Nicotine dependence, cigarettes, uncomplicated: Secondary | ICD-10-CM | POA: Diagnosis not present

## 2017-05-03 DIAGNOSIS — Z79899 Other long term (current) drug therapy: Secondary | ICD-10-CM | POA: Diagnosis not present

## 2017-05-03 DIAGNOSIS — B20 Human immunodeficiency virus [HIV] disease: Secondary | ICD-10-CM | POA: Diagnosis not present

## 2017-05-03 DIAGNOSIS — F10129 Alcohol abuse with intoxication, unspecified: Secondary | ICD-10-CM | POA: Insufficient documentation

## 2017-05-03 NOTE — ED Triage Notes (Signed)
Per [facility], pt has had 4 40's, pt stating he did $900 of cocaine, is hearing voices telling them to kill himself.
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
## 2017-05-03 NOTE — ED Notes (Signed)
Pt refused to sign d/c.  Pt taken to exit by [employer] Off Duty.
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
## 2017-05-03 NOTE — ED Notes (Signed)
Bed: [id number] Expected date:  Expected time:  Means of arrival:  Comments: 61 yo M intox/SI

## 2017-05-07 ENCOUNTER — Encounter (HOSPITAL_COMMUNITY): Payer: Self-pay | Admitting: Emergency Medicine

## 2017-05-07 ENCOUNTER — Emergency Department (HOSPITAL_COMMUNITY)
Admission: EM | Admit: 2017-05-07 | Discharge: 2017-05-07 | Disposition: A | Discharge status: Home / Self Care | Payer: Medicare Other | Attending: Emergency Medicine | Admitting: Emergency Medicine

## 2017-05-07 DIAGNOSIS — F1490 Cocaine use, unspecified, uncomplicated: Secondary | ICD-10-CM | POA: Diagnosis not present

## 2017-05-07 DIAGNOSIS — B20 Human immunodeficiency virus [HIV] disease: Secondary | ICD-10-CM | POA: Insufficient documentation

## 2017-05-07 DIAGNOSIS — F10120 Alcohol abuse with intoxication, uncomplicated: Secondary | ICD-10-CM | POA: Diagnosis present

## 2017-05-07 DIAGNOSIS — I10 Essential (primary) hypertension: Secondary | ICD-10-CM | POA: Insufficient documentation

## 2017-05-07 DIAGNOSIS — F319 Bipolar disorder, unspecified: Secondary | ICD-10-CM | POA: Insufficient documentation

## 2017-05-07 DIAGNOSIS — F17210 Nicotine dependence, cigarettes, uncomplicated: Secondary | ICD-10-CM | POA: Diagnosis not present

## 2017-05-07 DIAGNOSIS — F259 Schizoaffective disorder, unspecified: Secondary | ICD-10-CM | POA: Diagnosis not present

## 2017-05-07 DIAGNOSIS — J45909 Unspecified asthma, uncomplicated: Secondary | ICD-10-CM | POA: Insufficient documentation

## 2017-05-07 DIAGNOSIS — F10920 Alcohol use, unspecified with intoxication, uncomplicated: Secondary | ICD-10-CM

## 2017-05-07 NOTE — ED Notes (Signed)
CBG: 115 
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
## 2017-05-07 NOTE — ED Notes (Signed)
Given bus pass. 

## 2017-05-07 NOTE — ED Notes (Signed)
Patient is ambulatory. Walking out in hallway. Yelling, and screaming.
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
## 2017-05-07 NOTE — ED Notes (Signed)
Bed: WA31 Expected date:  Expected time:  Means of arrival:  Comments: 

## 2017-06-05 ENCOUNTER — Ambulatory Visit: Payer: Self-pay

## 2017-06-26 ENCOUNTER — Ambulatory Visit: Payer: Self-pay

## 2017-07-29 ENCOUNTER — Encounter (HOSPITAL_COMMUNITY): Payer: Self-pay

## 2017-07-29 ENCOUNTER — Emergency Department (HOSPITAL_COMMUNITY)
Admission: EM | Admit: 2017-07-29 | Discharge: 2017-07-29 | Disposition: A | Discharge status: Home / Self Care | Payer: 59 | Source: Home / Self Care | Attending: Emergency Medicine | Admitting: Emergency Medicine

## 2017-07-29 ENCOUNTER — Emergency Department (HOSPITAL_COMMUNITY)
Admission: EM | Admit: 2017-07-29 | Discharge: 2017-07-30 | Disposition: A | Discharge status: Home / Self Care | Payer: 59 | Attending: Emergency Medicine | Admitting: Emergency Medicine

## 2017-07-29 DIAGNOSIS — F250 Schizoaffective disorder, bipolar type: Secondary | ICD-10-CM | POA: Insufficient documentation

## 2017-07-29 DIAGNOSIS — F17210 Nicotine dependence, cigarettes, uncomplicated: Secondary | ICD-10-CM

## 2017-07-29 DIAGNOSIS — Y907 Blood alcohol level of 200-239 mg/100 ml: Secondary | ICD-10-CM | POA: Insufficient documentation

## 2017-07-29 DIAGNOSIS — Z760 Encounter for issue of repeat prescription: Secondary | ICD-10-CM | POA: Insufficient documentation

## 2017-07-29 DIAGNOSIS — F10920 Alcohol use, unspecified with intoxication, uncomplicated: Secondary | ICD-10-CM | POA: Diagnosis present

## 2017-07-29 DIAGNOSIS — F1210 Cannabis abuse, uncomplicated: Secondary | ICD-10-CM | POA: Diagnosis not present

## 2017-07-29 DIAGNOSIS — Z79899 Other long term (current) drug therapy: Secondary | ICD-10-CM

## 2017-07-29 DIAGNOSIS — J449 Chronic obstructive pulmonary disease, unspecified: Secondary | ICD-10-CM

## 2017-07-29 DIAGNOSIS — B20 Human immunodeficiency virus [HIV] disease: Secondary | ICD-10-CM | POA: Insufficient documentation

## 2017-07-29 DIAGNOSIS — F1410 Cocaine abuse, uncomplicated: Secondary | ICD-10-CM | POA: Insufficient documentation

## 2017-07-29 DIAGNOSIS — Z8614 Personal history of Methicillin resistant Staphylococcus aureus infection: Secondary | ICD-10-CM | POA: Insufficient documentation

## 2017-07-29 DIAGNOSIS — F209 Schizophrenia, unspecified: Secondary | ICD-10-CM | POA: Insufficient documentation

## 2017-07-29 DIAGNOSIS — Z8659 Personal history of other mental and behavioral disorders: Secondary | ICD-10-CM
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
## 2017-07-29 NOTE — ED Notes (Signed)
Bed: WA27 Expected date:  Expected time:  Means of arrival:  Comments: 62 yo med clearance

## 2017-07-29 NOTE — ED Notes (Signed)
Bed: [id number] Expected date:  Expected time:  Means of arrival:  Comments: 62yo M/ out of medications

## 2017-07-29 NOTE — ED Triage Notes (Addendum)
Patient arrived via [facility] from home. Patient has been out of his medications and has not let EMS touch them. Patient let one EMS get blood pressure and that's it. BP-148/90. Bipolar, HIV, paranoid, alcohol abuse. Allergic to Haldol. Patient is non compliant and yelling.
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
## 2017-07-30 DIAGNOSIS — F10920 Alcohol use, unspecified with intoxication, uncomplicated: Secondary | ICD-10-CM | POA: Diagnosis not present

## 2017-07-30 LAB — RAPID URINE DRUG SCREEN, HOSP PERFORMED
Amphetamines: NOT DETECTED
Barbiturates: NOT DETECTED
Benzodiazepines: NOT DETECTED
Cocaine: NOT DETECTED
Opiates: NOT DETECTED
Tetrahydrocannabinol: NOT DETECTED

## 2017-07-30 LAB — CBC WITH DIFFERENTIAL/PLATELET
Basophils Absolute: 0 10*3/uL (ref 0.0–0.1)
Basophils Relative: 1 %
Eosinophils Absolute: 0.3 10*3/uL (ref 0.0–0.7)
Eosinophils Relative: 4 %
HCT: 38.7 % — ABNORMAL LOW (ref 39.0–52.0)
Hemoglobin: 13.2 g/dL (ref 13.0–17.0)
Lymphocytes Relative: 36 %
Lymphs Abs: 2.9 10*3/uL (ref 0.7–4.0)
MCH: 32.3 pg (ref 26.0–34.0)
MCHC: 34.1 g/dL (ref 30.0–36.0)
MCV: 94.6 fL (ref 78.0–100.0)
Monocytes Absolute: 0.6 10*3/uL (ref 0.1–1.0)
Monocytes Relative: 8 %
Neutro Abs: 4.1 10*3/uL (ref 1.7–7.7)
Neutrophils Relative %: 51 %
Platelets: 181 10*3/uL (ref 150–400)
RBC: 4.09 MIL/uL — ABNORMAL LOW (ref 4.22–5.81)
RDW: 13 % (ref 11.5–15.5)
WBC: 7.9 10*3/uL (ref 4.0–10.5)

## 2017-07-30 LAB — COMPREHENSIVE METABOLIC PANEL
ALT: 39 U/L (ref 17–63)
AST: 72 U/L — ABNORMAL HIGH (ref 15–41)
Albumin: 3.4 g/dL — ABNORMAL LOW (ref 3.5–5.0)
Alkaline Phosphatase: 65 U/L (ref 38–126)
Anion gap: 8 (ref 5–15)
BUN: 11 mg/dL (ref 6–20)
CO2: 27 mmol/L (ref 22–32)
Calcium: 9.3 mg/dL (ref 8.9–10.3)
Chloride: 97 mmol/L — ABNORMAL LOW (ref 101–111)
Creatinine, Ser: 0.77 mg/dL (ref 0.61–1.24)
GFR calc Af Amer: >60 mL/min (ref >60)
GFR calc non Af Amer: >60 mL/min (ref >60)
Glucose, Bld: 88 mg/dL (ref 65–99)
Potassium: 2.7 mmol/L — CL (ref 3.5–5.1)
Sodium: 132 mmol/L — ABNORMAL LOW (ref 135–145)
Total Bilirubin: 0.6 mg/dL (ref 0.3–1.2)
Total Protein: 8.6 g/dL — ABNORMAL HIGH (ref 6.5–8.1)

## 2017-07-30 LAB — ACETAMINOPHEN LEVEL: Acetaminophen (Tylenol), Serum: <10 ug/mL — ABNORMAL LOW (ref 10–30)

## 2017-07-30 LAB — SALICYLATE LEVEL: Salicylate Lvl: <7 mg/dL (ref 2.8–30.0)

## 2017-07-30 LAB — ETHANOL: Alcohol, Ethyl (B): 208 mg/dL — ABNORMAL HIGH (ref <10)

## 2017-07-30 MED ORDER — POTASSIUM CHLORIDE CRYS ER 20 MEQ PO TBCR
60.0000 meq | EXTENDED_RELEASE_TABLET | Freq: Once | ORAL | Status: AC
  Administered 2017-07-30: 60 meq via ORAL
  Filled 2017-07-30: qty 3
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
## 2017-07-30 NOTE — ED Notes (Signed)
Date and time results received: 07/30/17 0513 (use smartphrase ".now" to insert current time)  Test: K+ Critical Value: 2.7  Name of Provider Notified: Dr. Freida BusmanAllen  Orders Received? Or Actions Taken?: none

## 2017-07-30 NOTE — ED Notes (Signed)
Provided pt with 2 sandwiches and drink on discharge to lobby.

## 2017-09-07 ENCOUNTER — Emergency Department (HOSPITAL_COMMUNITY)
Admission: EM | Admit: 2017-09-07 | Discharge: 2017-09-07 | Disposition: A | Discharge status: Home / Self Care | Payer: 59 | Attending: Emergency Medicine | Admitting: Emergency Medicine

## 2017-09-07 ENCOUNTER — Other Ambulatory Visit: Payer: Self-pay

## 2017-09-07 ENCOUNTER — Encounter (HOSPITAL_COMMUNITY): Payer: Self-pay

## 2017-09-07 DIAGNOSIS — F10920 Alcohol use, unspecified with intoxication, uncomplicated: Secondary | ICD-10-CM

## 2017-09-07 DIAGNOSIS — Z79899 Other long term (current) drug therapy: Secondary | ICD-10-CM | POA: Insufficient documentation

## 2017-09-07 DIAGNOSIS — F10120 Alcohol abuse with intoxication, uncomplicated: Secondary | ICD-10-CM | POA: Diagnosis not present

## 2017-09-07 DIAGNOSIS — F17210 Nicotine dependence, cigarettes, uncomplicated: Secondary | ICD-10-CM | POA: Insufficient documentation

## 2017-09-07 DIAGNOSIS — J449 Chronic obstructive pulmonary disease, unspecified: Secondary | ICD-10-CM | POA: Diagnosis not present

## 2017-09-07 DIAGNOSIS — Z21 Asymptomatic human immunodeficiency virus [HIV] infection status: Secondary | ICD-10-CM | POA: Diagnosis not present

## 2017-09-07 DIAGNOSIS — F1410 Cocaine abuse, uncomplicated: Secondary | ICD-10-CM

## 2017-09-07 NOTE — ED Notes (Signed)
PT INCONSISTENT WITH BEHAVIOR. CURSING IN HALLWAY. MAKING THREATING GESTURES. [employer] ESCORTING PT TO AMBULANCE BAY AWAITING DISCHARGE
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
## 2017-09-07 NOTE — ED Notes (Signed)
ED Provider at bedside. EDP PICKERING AT BEDSIDE. DISCHARGE PENDING
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
## 2017-09-11 ENCOUNTER — Other Ambulatory Visit: Payer: Self-pay | Admitting: Infectious Disease

## 2017-09-11 DIAGNOSIS — B20 Human immunodeficiency virus [HIV] disease: Secondary | ICD-10-CM

## 2017-09-13 ENCOUNTER — Other Ambulatory Visit: Payer: Self-pay | Admitting: Behavioral Health

## 2017-09-13 ENCOUNTER — Other Ambulatory Visit: Payer: Self-pay

## 2017-09-13 ENCOUNTER — Telehealth: Payer: Self-pay

## 2017-09-13 DIAGNOSIS — B20 Human immunodeficiency virus [HIV] disease: Secondary | ICD-10-CM

## 2017-09-13 DIAGNOSIS — Z113 Encounter for screening for infections with a predominantly sexual mode of transmission: Secondary | ICD-10-CM

## 2017-09-13 DIAGNOSIS — Z79899 Other long term (current) drug therapy: Secondary | ICD-10-CM

## 2017-09-13 MED ORDER — ABACAVIR-DOLUTEGRAVIR-LAMIVUD 600-50-300 MG PO TABS: 1.0000 | ORAL_TABLET | Freq: Every day | ORAL | 0 refills | Status: DC

## 2017-09-13 NOTE — Telephone Encounter (Signed)
Pt called today stating the pharmacy needed a new order sent in for pt to get Triumeq. Called pharmacy to follow up with this request and pharmacist stated that the pt did need a new order to continue his medication. Spoke with the Triage RN who stated she had sent in the order already to the pharmacy. [name], [place]

## 2017-09-20 ENCOUNTER — Ambulatory Visit: Payer: Self-pay | Admitting: Infectious Disease

## 2017-09-21 ENCOUNTER — Emergency Department (HOSPITAL_COMMUNITY)
Admission: EM | Admit: 2017-09-21 | Discharge: 2017-09-22 | Disposition: A | Discharge status: Home / Self Care | Payer: Medicare HMO | Attending: Emergency Medicine | Admitting: Emergency Medicine

## 2017-09-21 ENCOUNTER — Encounter (HOSPITAL_COMMUNITY): Payer: Self-pay | Admitting: *Deleted

## 2017-09-21 DIAGNOSIS — F1410 Cocaine abuse, uncomplicated: Secondary | ICD-10-CM | POA: Insufficient documentation

## 2017-09-21 DIAGNOSIS — F17210 Nicotine dependence, cigarettes, uncomplicated: Secondary | ICD-10-CM | POA: Diagnosis not present

## 2017-09-21 DIAGNOSIS — F1010 Alcohol abuse, uncomplicated: Secondary | ICD-10-CM | POA: Insufficient documentation

## 2017-09-21 DIAGNOSIS — F319 Bipolar disorder, unspecified: Secondary | ICD-10-CM | POA: Insufficient documentation

## 2017-09-21 DIAGNOSIS — Z79899 Other long term (current) drug therapy: Secondary | ICD-10-CM | POA: Diagnosis not present

## 2017-09-21 DIAGNOSIS — J449 Chronic obstructive pulmonary disease, unspecified: Secondary | ICD-10-CM | POA: Insufficient documentation

## 2017-09-21 DIAGNOSIS — F1210 Cannabis abuse, uncomplicated: Secondary | ICD-10-CM | POA: Diagnosis not present

## 2017-09-21 DIAGNOSIS — Y906 Blood alcohol level of 120-199 mg/100 ml: Secondary | ICD-10-CM | POA: Diagnosis not present

## 2017-09-21 DIAGNOSIS — B20 Human immunodeficiency virus [HIV] disease: Secondary | ICD-10-CM | POA: Diagnosis not present

## 2017-09-21 LAB — CBC WITH DIFFERENTIAL/PLATELET
Basophils Absolute: 0 10*3/uL (ref 0.0–0.1)
Basophils Relative: 0 %
Eosinophils Absolute: 0.2 10*3/uL (ref 0.0–0.7)
Eosinophils Relative: 3 %
HCT: 37.1 % — ABNORMAL LOW (ref 39.0–52.0)
Hemoglobin: 12.2 g/dL — ABNORMAL LOW (ref 13.0–17.0)
Lymphocytes Relative: 30 %
Lymphs Abs: 1.9 10*3/uL (ref 0.7–4.0)
MCH: 31.6 pg (ref 26.0–34.0)
MCHC: 32.9 g/dL (ref 30.0–36.0)
MCV: 96.1 fL (ref 78.0–100.0)
Monocytes Absolute: 0.6 10*3/uL (ref 0.1–1.0)
Monocytes Relative: 9 %
Neutro Abs: 3.7 10*3/uL (ref 1.7–7.7)
Neutrophils Relative %: 58 %
Platelets: 142 10*3/uL — ABNORMAL LOW (ref 150–400)
RBC: 3.86 MIL/uL — ABNORMAL LOW (ref 4.22–5.81)
RDW: 13.7 % (ref 11.5–15.5)
WBC: 6.4 10*3/uL (ref 4.0–10.5)

## 2017-09-21 LAB — COMPREHENSIVE METABOLIC PANEL
ALT: 55 U/L (ref 17–63)
AST: 101 U/L — ABNORMAL HIGH (ref 15–41)
Albumin: 3.2 g/dL — ABNORMAL LOW (ref 3.5–5.0)
Alkaline Phosphatase: 63 U/L (ref 38–126)
Anion gap: 11 (ref 5–15)
BUN: 14 mg/dL (ref 6–20)
CO2: 23 mmol/L (ref 22–32)
Calcium: 8.8 mg/dL — ABNORMAL LOW (ref 8.9–10.3)
Chloride: 104 mmol/L (ref 101–111)
Creatinine, Ser: 1.17 mg/dL (ref 0.61–1.24)
GFR calc Af Amer: >60 mL/min (ref >60)
GFR calc non Af Amer: >60 mL/min (ref >60)
Glucose, Bld: 91 mg/dL (ref 65–99)
Potassium: 3.6 mmol/L (ref 3.5–5.1)
Sodium: 138 mmol/L (ref 135–145)
Total Bilirubin: 1 mg/dL (ref 0.3–1.2)
Total Protein: 8 g/dL (ref 6.5–8.1)

## 2017-09-21 LAB — RAPID URINE DRUG SCREEN, HOSP PERFORMED
Amphetamines: NOT DETECTED
Barbiturates: NOT DETECTED
Benzodiazepines: NOT DETECTED
Cocaine: NOT DETECTED
Opiates: NOT DETECTED
Tetrahydrocannabinol: NOT DETECTED

## 2017-09-21 LAB — ETHANOL: Alcohol, Ethyl (B): 174 mg/dL — ABNORMAL HIGH (ref <10)

## 2017-09-21 NOTE — ED Triage Notes (Signed)
Patient was picked up on side of the road laying in the grass.  Patient requested EMS to take him to hospital due to detox.  Patient is also noncompliant with medication.  Patient is asking for a mental health evaluation.  Patient is alert and able to follow commands

## 2017-09-21 NOTE — ED Notes (Signed)
Pt now awake and is agitated and talking loudly, needing frequent redirection from staff due to volume.

## 2017-09-21 NOTE — ED Notes (Signed)
Patient cursing staff and demanding to use the phone, even after just having used it to call out. Pt noted to have a lighter, cigarettes, wallet, and a phone charger on his bed. All items including clothing placed in locker #29. Pt given drinks at this time.

## 2017-09-21 NOTE — ED Notes (Signed)
Pt is a/ox4, denies SI/HI, c/o "mental health"  sts hes been off his medication and wants help getting back on his medications
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
## 2017-09-21 NOTE — ED Notes (Signed)
Awaiting to see EDP

## 2017-09-21 NOTE — ED Notes (Signed)
Pt sts hes homeless

## 2017-09-21 NOTE — ED Notes (Signed)
Bed: ZO10 Expected date:  Expected time:  Means of arrival:  Comments: EMS-psych eval

## 2017-09-21 NOTE — ED Notes (Signed)
Pt currently asleep no distress noted. 

## 2017-09-21 NOTE — ED Notes (Signed)
Misty Stanley, charge nurse notified of need for lab draw at this time.

## 2017-09-22 NOTE — ED Notes (Signed)
Pt currently getting dressed, as he is discharged. Pt belongings returned to him at this time. Pt given printed discharge instructions. Pt verbalizes an understanding. No distress noted.

## 2017-09-25 ENCOUNTER — Encounter (HOSPITAL_COMMUNITY): Payer: Self-pay

## 2017-09-25 ENCOUNTER — Emergency Department (HOSPITAL_COMMUNITY): Payer: Medicare HMO

## 2017-09-25 ENCOUNTER — Other Ambulatory Visit: Payer: Self-pay

## 2017-09-25 ENCOUNTER — Emergency Department (HOSPITAL_COMMUNITY)
Admission: EM | Admit: 2017-09-25 | Discharge: 2017-09-26 | Disposition: A | Discharge status: Other Acute Inpatient Hospital | Payer: Medicare HMO | Attending: Emergency Medicine | Admitting: Emergency Medicine

## 2017-09-25 DIAGNOSIS — F12288 Cannabis dependence with other cannabis-induced disorder: Secondary | ICD-10-CM | POA: Insufficient documentation

## 2017-09-25 DIAGNOSIS — F1024 Alcohol dependence with alcohol-induced mood disorder: Secondary | ICD-10-CM | POA: Diagnosis not present

## 2017-09-25 DIAGNOSIS — J45909 Unspecified asthma, uncomplicated: Secondary | ICD-10-CM | POA: Diagnosis not present

## 2017-09-25 DIAGNOSIS — F17210 Nicotine dependence, cigarettes, uncomplicated: Secondary | ICD-10-CM | POA: Diagnosis not present

## 2017-09-25 DIAGNOSIS — Z79899 Other long term (current) drug therapy: Secondary | ICD-10-CM | POA: Insufficient documentation

## 2017-09-25 DIAGNOSIS — F333 Major depressive disorder, recurrent, severe with psychotic symptoms: Secondary | ICD-10-CM | POA: Diagnosis not present

## 2017-09-25 DIAGNOSIS — B20 Human immunodeficiency virus [HIV] disease: Secondary | ICD-10-CM | POA: Insufficient documentation

## 2017-09-25 DIAGNOSIS — F1420 Cocaine dependence, uncomplicated: Secondary | ICD-10-CM | POA: Diagnosis not present

## 2017-09-25 DIAGNOSIS — R45851 Suicidal ideations: Secondary | ICD-10-CM | POA: Diagnosis not present

## 2017-09-25 DIAGNOSIS — F329 Major depressive disorder, single episode, unspecified: Secondary | ICD-10-CM | POA: Diagnosis present

## 2017-09-25 DIAGNOSIS — R440 Auditory hallucinations: Secondary | ICD-10-CM | POA: Diagnosis not present

## 2017-09-25 DIAGNOSIS — I10 Essential (primary) hypertension: Secondary | ICD-10-CM | POA: Diagnosis not present

## 2017-09-25 DIAGNOSIS — F1010 Alcohol abuse, uncomplicated: Secondary | ICD-10-CM | POA: Diagnosis not present

## 2017-09-25 LAB — ACETAMINOPHEN LEVEL: Acetaminophen (Tylenol), Serum: <10 ug/mL — ABNORMAL LOW (ref 10–30)

## 2017-09-25 LAB — COMPREHENSIVE METABOLIC PANEL
ALT: 51 U/L (ref 17–63)
AST: 100 U/L — ABNORMAL HIGH (ref 15–41)
Albumin: 3.6 g/dL (ref 3.5–5.0)
Alkaline Phosphatase: 79 U/L (ref 38–126)
Anion gap: 9 (ref 5–15)
BUN: 12 mg/dL (ref 6–20)
CO2: 25 mmol/L (ref 22–32)
Calcium: 8.9 mg/dL (ref 8.9–10.3)
Chloride: 108 mmol/L (ref 101–111)
Creatinine, Ser: 0.98 mg/dL (ref 0.61–1.24)
GFR calc Af Amer: >60 mL/min (ref >60)
GFR calc non Af Amer: >60 mL/min (ref >60)
Glucose, Bld: 70 mg/dL (ref 65–99)
Potassium: 3.9 mmol/L (ref 3.5–5.1)
Sodium: 142 mmol/L (ref 135–145)
Total Bilirubin: 0.7 mg/dL (ref 0.3–1.2)
Total Protein: 9.4 g/dL — ABNORMAL HIGH (ref 6.5–8.1)

## 2017-09-25 LAB — CBC
HCT: 44.4 % (ref 39.0–52.0)
Hemoglobin: 14.5 g/dL (ref 13.0–17.0)
MCH: 31.7 pg (ref 26.0–34.0)
MCHC: 32.7 g/dL (ref 30.0–36.0)
MCV: 97.2 fL (ref 78.0–100.0)
Platelets: 203 10*3/uL (ref 150–400)
RBC: 4.57 MIL/uL (ref 4.22–5.81)
RDW: 13.9 % (ref 11.5–15.5)
WBC: 6.1 10*3/uL (ref 4.0–10.5)

## 2017-09-25 LAB — RAPID URINE DRUG SCREEN, HOSP PERFORMED
Amphetamines: NOT DETECTED
Barbiturates: NOT DETECTED
Benzodiazepines: NOT DETECTED
Cocaine: NOT DETECTED
Opiates: NOT DETECTED
Tetrahydrocannabinol: POSITIVE — AB

## 2017-09-25 LAB — SALICYLATE LEVEL: Salicylate Lvl: <7 mg/dL (ref 2.8–30.0)

## 2017-09-25 LAB — ETHANOL: Alcohol, Ethyl (B): 233 mg/dL — ABNORMAL HIGH (ref <10)

## 2017-09-25 MED ORDER — LORAZEPAM 2 MG/ML IJ SOLN: 0.0000 mg | Freq: Four times a day (QID) | INTRAMUSCULAR | Status: DC

## 2017-09-25 MED ORDER — LORAZEPAM 2 MG/ML IJ SOLN: 0.0000 mg | Freq: Two times a day (BID) | INTRAMUSCULAR | Status: DC

## 2017-09-25 MED ORDER — LORAZEPAM 1 MG PO TABS: 0.0000 mg | ORAL_TABLET | Freq: Two times a day (BID) | ORAL | Status: DC

## 2017-09-25 MED ORDER — LORAZEPAM 1 MG PO TABS
0.0000 mg | ORAL_TABLET | Freq: Four times a day (QID) | ORAL | Status: DC
  Administered 2017-09-25 – 2017-09-26 (×2): 2 mg via ORAL
  Administered 2017-09-26: 1 mg via ORAL
  Filled 2017-09-25 (×2): qty 2
  Filled 2017-09-25: qty 1
  Filled 2017-09-25: qty 2

## 2017-09-25 MED ORDER — VITAMIN B-1 100 MG PO TABS
100.0000 mg | ORAL_TABLET | Freq: Every day | ORAL | Status: DC
  Administered 2017-09-25 – 2017-09-26 (×3): 100 mg via ORAL
  Filled 2017-09-25 (×4): qty 1

## 2017-09-25 MED ORDER — THIAMINE HCL 100 MG/ML IJ SOLN: 100.0000 mg | Freq: Every day | INTRAMUSCULAR | Status: DC
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
## 2017-09-25 NOTE — ED Triage Notes (Signed)
Pt bib EMS and was picked up a [employer].  Pt is off his medications x 4 months.    Hx Schizophrenia, bipolar, HIV  BP: 112/78, HR 82, RR: 16

## 2017-09-25 NOTE — ED Notes (Signed)
Bed: [facility] Expected date:  Expected time:  Means of arrival:  Comments: 
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
## 2017-09-26 DIAGNOSIS — F1010 Alcohol abuse, uncomplicated: Secondary | ICD-10-CM | POA: Diagnosis not present

## 2017-09-26 DIAGNOSIS — R440 Auditory hallucinations: Secondary | ICD-10-CM

## 2017-09-26 DIAGNOSIS — Y907 Blood alcohol level of 200-239 mg/100 ml: Secondary | ICD-10-CM | POA: Diagnosis not present

## 2017-09-26 DIAGNOSIS — F1210 Cannabis abuse, uncomplicated: Secondary | ICD-10-CM | POA: Diagnosis not present

## 2017-09-26 DIAGNOSIS — R45851 Suicidal ideations: Secondary | ICD-10-CM

## 2017-09-26 DIAGNOSIS — F1420 Cocaine dependence, uncomplicated: Secondary | ICD-10-CM

## 2017-09-26 DIAGNOSIS — F17210 Nicotine dependence, cigarettes, uncomplicated: Secondary | ICD-10-CM | POA: Diagnosis not present

## 2017-09-26 DIAGNOSIS — F333 Major depressive disorder, recurrent, severe with psychotic symptoms: Secondary | ICD-10-CM | POA: Diagnosis not present

## 2017-09-26 MED ORDER — ABACAVIR-DOLUTEGRAVIR-LAMIVUD 600-50-300 MG PO TABS
1.0000 | ORAL_TABLET | Freq: Every day | ORAL | Status: DC
  Administered 2017-09-26: 1 via ORAL
  Filled 2017-09-26: qty 1
  Filled 2017-09-26: qty 4

## 2017-09-26 MED ORDER — RISPERIDONE 1 MG PO TABS: 1.5000 mg | ORAL_TABLET | Freq: Every day | ORAL | Status: DC

## 2017-09-26 MED ORDER — RISPERIDONE 1 MG PO TABS
1.5000 mg | ORAL_TABLET | Freq: Once | ORAL | Status: AC
  Administered 2017-09-26: 1.5 mg via ORAL
  Filled 2017-09-26: qty 1

## 2017-09-26 MED ORDER — RISPERIDONE 2 MG PO TABS: 3.0000 mg | ORAL_TABLET | Freq: Every day | ORAL | Status: DC

## 2017-09-26 MED ORDER — ALBUTEROL SULFATE HFA 108 (90 BASE) MCG/ACT IN AERS
2.0000 | INHALATION_SPRAY | Freq: Four times a day (QID) | RESPIRATORY_TRACT | Status: DC | PRN
Start: 2017-09-26 — End: 2017-09-27

## 2017-09-26 MED ORDER — GABAPENTIN 100 MG PO CAPS
100.0000 mg | ORAL_CAPSULE | Freq: Three times a day (TID) | ORAL | Status: DC
  Administered 2017-09-26 (×3): 100 mg via ORAL
  Filled 2017-09-26 (×3): qty 1

## 2017-09-26 MED ORDER — GABAPENTIN 100 MG PO CAPS: 100.0000 mg | ORAL_CAPSULE | Freq: Three times a day (TID) | ORAL | Status: DC

## 2017-09-26 MED ORDER — SERTRALINE HCL 50 MG PO TABS
100.0000 mg | ORAL_TABLET | Freq: Every day | ORAL | Status: DC
  Administered 2017-09-26: 100 mg via ORAL
  Filled 2017-09-26: qty 2

## 2017-09-26 MED ORDER — SERTRALINE HCL 50 MG PO TABS: 100.0000 mg | ORAL_TABLET | Freq: Every day | ORAL | Status: DC

## 2017-09-26 MED ORDER — ABACAVIR-DOLUTEGRAVIR-LAMIVUD 600-50-300 MG PO TABS: 1.0000 | ORAL_TABLET | Freq: Every day | ORAL | 0 refills | Status: DC

## 2017-09-26 NOTE — Progress Notes (Signed)
09/26/17  1940  MD wrote rx. Rx tubed to pharmacy. CM notified that rx was sent to pharmacy. Spoke with Huntley Dec at [facility] [phone] notiifed her that we had rx and waiting for meds from pharmacy. Per Huntley Dec pt can go to [facility] after we receive the medicine. We need to call report and arrange transportation. Night RN aware.

## 2017-09-26 NOTE — Progress Notes (Signed)
09/26/17  1900  Spoke with Huntley Dec at [facility]. Pt has a bed available at [facility]. She states that patient needs about 3-4 days of Triumeq to take to [facility] until they can get the meds in stock. Called [facility] [phone] and was referred to [facility] [phone]/ [phone]. Message was left for CM to return call in regards to meds for patient. Waiting for response.

## 2017-09-26 NOTE — ED Provider Notes (Signed)
Provide a prescription of the patient's HIV medicine.  This is needed for about 4 days so that he can receive pills to take to the receiving psychiatric facility until his prescription will go through there.   Pricilla Loveless, MD 09/26/17 [phone]

## 2017-09-26 NOTE — ED Notes (Signed)
Informed patient of plans for his care at [facility]. Patient stated concern of how he will get back when he is done at facility. I stated we will worry about when the time comes. He smiled and stated that would be fine.

## 2017-09-26 NOTE — Progress Notes (Signed)
Pt meets criteria for inpatient per Juanetta Beets, DO. Referral information has been sent to the following hospitals: [facility]  [facility]  [facility]  [facility]  [facility]  [facility]  [facility]   CSW will continue to assist with placement needs.   Wells Guiles, LCSW, LCAS Disposition CSW [facility] [phone]

## 2017-09-26 NOTE — Care Management (Signed)
[facility] ED CM called inpatient [facility] to make them aware that patient will be needing 4 day supply of medications for discharge. CM Instructed Dekina RN  to send prescriptions for the 4 day supply to [facility] ED pharmacy and they will provide the medications for transport  To [facility]

## 2017-09-26 NOTE — ED Notes (Signed)
Had MD to complete EMTALA for transfer. All paperwork placed and envelope along with HIV medications for 4 days. [facility] is here to pick up patient and take to facility. [facility] took patient, envelope and patient's belongings with him to facility.

## 2017-09-26 NOTE — ED Provider Notes (Signed)
Patient accepted to [facility].  Dr. Daphane Shepherd is the accepting physician.  He appears stable for discharge.   Pricilla Loveless, MD 09/26/17 2119

## 2017-09-26 NOTE — Progress Notes (Signed)
CSW received request from pt's RN for pt's facesheet for [facility]'s needs.  CSW provided.  Please reconsult if future social work needs arise.  CSW signing off, as social work intervention is no longer needed.  [name]. Jamus Loving, LCSW, LCAS, CSI Clinical Social Worker Ph: [phone]

## 2017-09-26 NOTE — ED Notes (Signed)
Called report to Dawson Bills, RN at [facility]. Left number if she had additional questions. Also, called [facility] transportation to take patient there.

## 2017-09-26 NOTE — Progress Notes (Signed)
Pt accepted to [facility].  Dr. Daphane Shepherd is the attending/accepting provider.   Call report to [phone]. Dekina @ [facility] ED notified.    Pt is involuntary and will need to be transported by law enforcement.  [facility] requests that pt bring a 3 to 4 days supply of Triumeq with him as they do not currently have this medication available. [facility]  ED has notified [facility] case management to assist with this tomorrow morning.  Randa Evens in admissions @ [facility] stated that pt may be transported on 09/27/17 if needed.The inpatient bed offer will be available for 24 hours.   Wells Guiles, LCSW, LCAS Disposition CSW [facility]/TTS [phone]
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
## 2017-09-26 NOTE — Progress Notes (Signed)
09/26/17  1926  Spoke with CM Burna Mortimer. She is going to call inpatient pharmacy to let them know that we will be sending a prescription for Triumeq. Per CM  MD needs to write rx and the rx needs to be given to inpatient pharmacy and they will send the medicine.

## 2017-09-26 NOTE — [facility] Counselor (Signed)
Per William Dalton, RN, pt has a bed at [facility]. Reports she has put a note in Epic but none seen yet.   Evette Cristal, [place] Therapeutic Triage Specialist

## 2017-09-26 NOTE — Progress Notes (Signed)
09/26/17  1600 [facility] called stating they needed a current (within 3 months) CD4 drawn. Spoke with MD. Order was placed for CD4 . Labs were drawn.

## 2017-09-27 LAB — T-HELPER CELLS (CD4) COUNT (NOT AT ARMC)
CD4 % Helper T Cell: 12 % — ABNORMAL LOW (ref 33–55)
CD4 T Cell Abs: 140 /uL — ABNORMAL LOW (ref 400–2700)

## 2017-10-05 ENCOUNTER — Encounter (HOSPITAL_COMMUNITY): Payer: Self-pay | Admitting: Emergency Medicine

## 2017-10-05 ENCOUNTER — Other Ambulatory Visit: Payer: Self-pay

## 2017-10-05 ENCOUNTER — Emergency Department (HOSPITAL_COMMUNITY)
Admission: EM | Admit: 2017-10-05 | Discharge: 2017-10-05 | Disposition: A | Discharge status: Home / Self Care | Payer: Medicare HMO | Attending: Emergency Medicine | Admitting: Emergency Medicine

## 2017-10-05 DIAGNOSIS — Z79899 Other long term (current) drug therapy: Secondary | ICD-10-CM | POA: Insufficient documentation

## 2017-10-05 DIAGNOSIS — I10 Essential (primary) hypertension: Secondary | ICD-10-CM | POA: Diagnosis not present

## 2017-10-05 DIAGNOSIS — F10929 Alcohol use, unspecified with intoxication, unspecified: Secondary | ICD-10-CM | POA: Diagnosis present

## 2017-10-05 DIAGNOSIS — J45909 Unspecified asthma, uncomplicated: Secondary | ICD-10-CM | POA: Insufficient documentation

## 2017-10-05 DIAGNOSIS — F17210 Nicotine dependence, cigarettes, uncomplicated: Secondary | ICD-10-CM | POA: Diagnosis not present

## 2017-10-05 DIAGNOSIS — F10920 Alcohol use, unspecified with intoxication, uncomplicated: Secondary | ICD-10-CM | POA: Insufficient documentation

## 2017-10-05 NOTE — Discharge Instructions (Signed)
Please follow-up with your PCP
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
## 2017-10-05 NOTE — ED Notes (Signed)
Patient presents with EMS-patient is loud and aggressive-patient arguing with another patient in hallway and using racial slurs-patient is not cooperative with any care except asking for a gingerale-security notified

## 2017-10-05 NOTE — ED Notes (Signed)
Pt refusing V/S once again

## 2017-10-05 NOTE — ED Notes (Signed)
Declined d/c vitals or signature. Taken out with security.

## 2017-10-05 NOTE — ED Triage Notes (Signed)
Pt here via ems for etoh intoxication. Pt yelled at staff "leave me the fuck alone".  Pt yelling at people in lobby and redirected not to. Pt refusing treatment at this time but states he wants to be seen.

## 2017-10-12 ENCOUNTER — Other Ambulatory Visit: Payer: Self-pay | Admitting: Behavioral Health

## 2017-10-12 ENCOUNTER — Telehealth: Payer: Self-pay

## 2017-10-12 ENCOUNTER — Other Ambulatory Visit: Payer: Self-pay

## 2017-10-12 DIAGNOSIS — B20 Human immunodeficiency virus [HIV] disease: Secondary | ICD-10-CM

## 2017-10-12 MED ORDER — ABACAVIR-DOLUTEGRAVIR-LAMIVUD 600-50-300 MG PO TABS: 1.0000 | ORAL_TABLET | Freq: Every day | ORAL | 1 refills | Status: DC

## 2017-10-12 NOTE — Telephone Encounter (Signed)
error 

## 2017-10-12 NOTE — Telephone Encounter (Signed)
Pt called today requesting refills on hiv medication pt has not seen Dr. Daiva Eves 02/03/16. Pt states that he has not missed any day of medication, but is out of medication now. Advised pt that he would need to be seen for refills on medication and that he must keep these appts. Pt verbalized understanding and is going to try his best to keep his appts. A month refill has been sent to the pharmacy with no additional refills. Lorenso Courier, [place]

## 2017-10-13 ENCOUNTER — Other Ambulatory Visit: Payer: Self-pay

## 2017-10-13 ENCOUNTER — Emergency Department (HOSPITAL_COMMUNITY)
Admission: EM | Admit: 2017-10-13 | Discharge: 2017-10-13 | Disposition: A | Discharge status: Home / Self Care | Payer: Medicare HMO | Attending: Emergency Medicine | Admitting: Emergency Medicine

## 2017-10-13 ENCOUNTER — Encounter (HOSPITAL_COMMUNITY): Payer: Self-pay

## 2017-10-13 DIAGNOSIS — J449 Chronic obstructive pulmonary disease, unspecified: Secondary | ICD-10-CM | POA: Insufficient documentation

## 2017-10-13 DIAGNOSIS — B20 Human immunodeficiency virus [HIV] disease: Secondary | ICD-10-CM | POA: Diagnosis not present

## 2017-10-13 DIAGNOSIS — F10920 Alcohol use, unspecified with intoxication, uncomplicated: Secondary | ICD-10-CM | POA: Insufficient documentation

## 2017-10-13 DIAGNOSIS — F17210 Nicotine dependence, cigarettes, uncomplicated: Secondary | ICD-10-CM | POA: Insufficient documentation

## 2017-10-13 DIAGNOSIS — F319 Bipolar disorder, unspecified: Secondary | ICD-10-CM | POA: Diagnosis not present

## 2017-10-13 DIAGNOSIS — Z79899 Other long term (current) drug therapy: Secondary | ICD-10-CM | POA: Insufficient documentation

## 2017-10-13 NOTE — ED Notes (Signed)
Pt was able to ambulate with no issue.

## 2017-10-13 NOTE — ED Notes (Signed)
Bed: WA05 Expected date:  Expected time:  Means of arrival:  Comments: EMS ETOH 
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
## 2017-10-13 NOTE — ED Notes (Signed)
Pt given a sandwich and cheese stick and some water with MD approval.

## 2017-10-13 NOTE — ED Triage Notes (Signed)
Patient BIB EMS for Alcohol intoxication . Patient found by bystander asleep on a bench in [place]. EMS was called. Patient alert to voice. Patient sleeping in triage. VSS- placed on 2LNC by EMS for oxygen saturations at 88% on RA.

## 2017-10-19 ENCOUNTER — Other Ambulatory Visit: Payer: Medicare HMO

## 2017-10-19 DIAGNOSIS — Z113 Encounter for screening for infections with a predominantly sexual mode of transmission: Secondary | ICD-10-CM

## 2017-10-19 DIAGNOSIS — B20 Human immunodeficiency virus [HIV] disease: Secondary | ICD-10-CM

## 2017-10-19 DIAGNOSIS — Z79899 Other long term (current) drug therapy: Secondary | ICD-10-CM

## 2017-10-20 LAB — LIPID PANEL
Cholesterol: 104 mg/dL (ref <200)
HDL: 36 mg/dL — ABNORMAL LOW (ref >40)
LDL Cholesterol (Calc): 54 mg/dL (calc)
Non-HDL Cholesterol (Calc): 68 mg/dL (calc) (ref <130)
Total CHOL/HDL Ratio: 2.9 (calc) (ref <5.0)
Triglycerides: 63 mg/dL (ref <150)

## 2017-10-20 LAB — CBC WITH DIFFERENTIAL/PLATELET
Basophils Absolute: 19 cells/uL (ref 0–200)
Basophils Relative: 0.4 %
Eosinophils Absolute: 141 cells/uL (ref 15–500)
Eosinophils Relative: 3 %
HCT: 40.1 % (ref 38.5–50.0)
Hemoglobin: 13.6 g/dL (ref 13.2–17.1)
Lymphs Abs: 954 cells/uL (ref 850–3900)
MCH: 31.7 pg (ref 27.0–33.0)
MCHC: 33.9 g/dL (ref 32.0–36.0)
MCV: 93.5 fL (ref 80.0–100.0)
Monocytes Relative: 6.9 %
Neutro Abs: 3262 cells/uL (ref 1500–7800)
Neutrophils Relative %: 69.4 %
RBC: 4.29 10*6/uL (ref 4.20–5.80)
RDW: 12.2 % (ref 11.0–15.0)
Total Lymphocyte: 20.3 %
WBC mixed population: 324 cells/uL (ref 200–950)
WBC: 4.7 10*3/uL (ref 3.8–10.8)

## 2017-10-20 LAB — COMPREHENSIVE METABOLIC PANEL
AG Ratio: 0.8 (calc) — ABNORMAL LOW (ref 1.0–2.5)
ALT: 31 U/L (ref 9–46)
AST: 48 U/L — ABNORMAL HIGH (ref 10–35)
Albumin: 3.5 g/dL — ABNORMAL LOW (ref 3.6–5.1)
Alkaline phosphatase (APISO): 64 U/L (ref 40–115)
BUN: 10 mg/dL (ref 7–25)
CO2: 25 mmol/L (ref 20–32)
Calcium: 9.2 mg/dL (ref 8.6–10.3)
Chloride: 106 mmol/L (ref 98–110)
Creat: 0.93 mg/dL (ref 0.70–1.25)
Globulin: 4.3 g/dL (calc) — ABNORMAL HIGH (ref 1.9–3.7)
Glucose, Bld: 121 mg/dL — ABNORMAL HIGH (ref 65–99)
Potassium: 3.8 mmol/L (ref 3.5–5.3)
Sodium: 141 mmol/L (ref 135–146)
Total Bilirubin: 0.8 mg/dL (ref 0.2–1.2)
Total Protein: 7.8 g/dL (ref 6.1–8.1)

## 2017-10-20 LAB — T-HELPER CELL (CD4) - (RCID CLINIC ONLY)
CD4 % Helper T Cell: 12 % — ABNORMAL LOW (ref 33–55)
CD4 T Cell Abs: 120 /uL — ABNORMAL LOW (ref 400–2700)

## 2017-10-20 LAB — RPR: RPR Ser Ql: NONREACTIVE

## 2017-10-23 LAB — HIV-1 RNA QUANT-NO REFLEX-BLD
HIV 1 RNA Quant: 43 copies/mL — ABNORMAL HIGH
HIV-1 RNA Quant, Log: 1.63 Log copies/mL — ABNORMAL HIGH

## 2017-11-04 ENCOUNTER — Emergency Department (HOSPITAL_COMMUNITY)
Admission: EM | Admit: 2017-11-04 | Discharge: 2017-11-05 | Disposition: A | Discharge status: Home / Self Care | Payer: Medicare HMO | Attending: Emergency Medicine | Admitting: Emergency Medicine

## 2017-11-04 ENCOUNTER — Emergency Department (HOSPITAL_COMMUNITY): Payer: Medicare HMO

## 2017-11-04 ENCOUNTER — Other Ambulatory Visit: Payer: Self-pay

## 2017-11-04 DIAGNOSIS — Z79899 Other long term (current) drug therapy: Secondary | ICD-10-CM | POA: Diagnosis not present

## 2017-11-04 DIAGNOSIS — R51 Headache: Secondary | ICD-10-CM | POA: Diagnosis not present

## 2017-11-04 DIAGNOSIS — J449 Chronic obstructive pulmonary disease, unspecified: Secondary | ICD-10-CM | POA: Insufficient documentation

## 2017-11-04 DIAGNOSIS — B20 Human immunodeficiency virus [HIV] disease: Secondary | ICD-10-CM | POA: Insufficient documentation

## 2017-11-04 DIAGNOSIS — I10 Essential (primary) hypertension: Secondary | ICD-10-CM | POA: Diagnosis not present

## 2017-11-04 DIAGNOSIS — F17210 Nicotine dependence, cigarettes, uncomplicated: Secondary | ICD-10-CM | POA: Diagnosis not present

## 2017-11-04 DIAGNOSIS — F10120 Alcohol abuse with intoxication, uncomplicated: Secondary | ICD-10-CM | POA: Diagnosis present

## 2017-11-04 DIAGNOSIS — F10920 Alcohol use, unspecified with intoxication, uncomplicated: Secondary | ICD-10-CM

## 2017-11-04 LAB — BASIC METABOLIC PANEL
Anion gap: 14 (ref 5–15)
BUN: 14 mg/dL (ref 6–20)
CO2: 19 mmol/L — ABNORMAL LOW (ref 22–32)
Calcium: 9.4 mg/dL (ref 8.9–10.3)
Chloride: 103 mmol/L (ref 101–111)
Creatinine, Ser: 1.19 mg/dL (ref 0.61–1.24)
GFR calc Af Amer: >60 mL/min (ref >60)
GFR calc non Af Amer: >60 mL/min (ref >60)
Glucose, Bld: 93 mg/dL (ref 65–99)
Potassium: 3.5 mmol/L (ref 3.5–5.1)
Sodium: 136 mmol/L (ref 135–145)

## 2017-11-04 LAB — CBC
HCT: 43.8 % (ref 39.0–52.0)
Hemoglobin: 14.1 g/dL (ref 13.0–17.0)
MCH: 31 pg (ref 26.0–34.0)
MCHC: 32.2 g/dL (ref 30.0–36.0)
MCV: 96.3 fL (ref 78.0–100.0)
Platelets: 129 10*3/uL — ABNORMAL LOW (ref 150–400)
RBC: 4.55 MIL/uL (ref 4.22–5.81)
RDW: 13.1 % (ref 11.5–15.5)
WBC: 6.9 10*3/uL (ref 4.0–10.5)

## 2017-11-04 LAB — ETHANOL: Alcohol, Ethyl (B): 261 mg/dL — ABNORMAL HIGH (ref <10)

## 2017-11-04 NOTE — ED Notes (Signed)
Pt given orange juice and turkey sandwich 

## 2017-11-04 NOTE — ED Notes (Signed)
Observed pt at end of the bed with the bed titled, rails raised; Thayer Ohmhris, RN at bedside inquiring as to why pt was out of bed. I stepped out of the room and returned a couple minutes later to find the pt on the floor with a puddle of urine at the foot of the bed.
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
## 2017-11-04 NOTE — ED Notes (Addendum)
Pt found lying on floor by Thayer Ohmhris, Charity fundraiserN. This RN walked in, examined pt. Pt c/o generalized pains, same as when he first came in. [place], Consulting civil engineerCharge RN made aware. EDP made aware.
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
## 2017-11-05 NOTE — ED Notes (Signed)
Pt given warm blanket, [place] sandwich, and coke per request

## 2017-11-05 NOTE — ED Provider Notes (Signed)
Patient monitored through the night and has done well.  He is now awake and alert.  He has eaten a sandwich and has been taking oral intake without difficulty.  Patient will be discharged.   Gilda CreasePollina, Casy Tavano J, MD 11/05/17 [phone]

## 2017-11-05 NOTE — ED Notes (Signed)
Pt awake and alert, ambulated to BR with a steady gait, verbalized understanding of d/c instructions.

## 2017-11-05 NOTE — ED Notes (Addendum)
Pt woke up, yelling that he wants everybody to shut up. Sts he has to use the bathroom and doesn't want to use the bedpan. Continuously sts "damn, you pretty. You [place] This RN attempted to redirect pt and convince him to stay on the bed due to him falling earlier. Pt removed all his monitoring devices and got out of bed. Came out yelling he needs to use the BR. Pt ambulatory w/o staggering or needing assistance. EDP made aware.

## 2017-11-08 ENCOUNTER — Emergency Department (HOSPITAL_COMMUNITY): Payer: Medicare HMO

## 2017-11-08 ENCOUNTER — Emergency Department (HOSPITAL_COMMUNITY)
Admission: EM | Admit: 2017-11-08 | Discharge: 2017-11-08 | Disposition: A | Discharge status: Home / Self Care | Payer: Medicare HMO | Attending: Emergency Medicine | Admitting: Emergency Medicine

## 2017-11-08 ENCOUNTER — Encounter (HOSPITAL_COMMUNITY): Payer: Self-pay

## 2017-11-08 ENCOUNTER — Other Ambulatory Visit: Payer: Self-pay

## 2017-11-08 DIAGNOSIS — Y908 Blood alcohol level of 240 mg/100 ml or more: Secondary | ICD-10-CM | POA: Insufficient documentation

## 2017-11-08 DIAGNOSIS — R1033 Periumbilical pain: Secondary | ICD-10-CM | POA: Diagnosis not present

## 2017-11-08 DIAGNOSIS — R4182 Altered mental status, unspecified: Secondary | ICD-10-CM | POA: Diagnosis present

## 2017-11-08 DIAGNOSIS — I10 Essential (primary) hypertension: Secondary | ICD-10-CM | POA: Insufficient documentation

## 2017-11-08 DIAGNOSIS — F17210 Nicotine dependence, cigarettes, uncomplicated: Secondary | ICD-10-CM | POA: Insufficient documentation

## 2017-11-08 DIAGNOSIS — J449 Chronic obstructive pulmonary disease, unspecified: Secondary | ICD-10-CM | POA: Diagnosis not present

## 2017-11-08 DIAGNOSIS — F10220 Alcohol dependence with intoxication, uncomplicated: Secondary | ICD-10-CM | POA: Insufficient documentation

## 2017-11-08 DIAGNOSIS — Z79899 Other long term (current) drug therapy: Secondary | ICD-10-CM | POA: Diagnosis not present

## 2017-11-08 DIAGNOSIS — B20 Human immunodeficiency virus [HIV] disease: Secondary | ICD-10-CM | POA: Diagnosis not present

## 2017-11-08 DIAGNOSIS — F10920 Alcohol use, unspecified with intoxication, uncomplicated: Secondary | ICD-10-CM

## 2017-11-08 LAB — CBC WITH DIFFERENTIAL/PLATELET
Basophils Absolute: 0 10*3/uL (ref 0.0–0.1)
Basophils Relative: 1 %
Eosinophils Absolute: 0.2 10*3/uL (ref 0.0–0.7)
Eosinophils Relative: 3 %
HCT: 42.8 % (ref 39.0–52.0)
Hemoglobin: 13.8 g/dL (ref 13.0–17.0)
Lymphocytes Relative: 39 %
Lymphs Abs: 2.5 10*3/uL (ref 0.7–4.0)
MCH: 31 pg (ref 26.0–34.0)
MCHC: 32.2 g/dL (ref 30.0–36.0)
MCV: 96.2 fL (ref 78.0–100.0)
Monocytes Absolute: 0.6 10*3/uL (ref 0.1–1.0)
Monocytes Relative: 9 %
Neutro Abs: 3.1 10*3/uL (ref 1.7–7.7)
Neutrophils Relative %: 48 %
Platelets: 137 10*3/uL — ABNORMAL LOW (ref 150–400)
RBC: 4.45 MIL/uL (ref 4.22–5.81)
RDW: 13.5 % (ref 11.5–15.5)
WBC: 6.4 10*3/uL (ref 4.0–10.5)

## 2017-11-08 LAB — URINALYSIS, ROUTINE W REFLEX MICROSCOPIC
Bilirubin Urine: NEGATIVE
Glucose, UA: NEGATIVE mg/dL
Hgb urine dipstick: NEGATIVE
Ketones, ur: NEGATIVE mg/dL
Leukocytes, UA: NEGATIVE
Nitrite: NEGATIVE
Protein, ur: NEGATIVE mg/dL
Specific Gravity, Urine: 1.003 — ABNORMAL LOW (ref 1.005–1.030)
pH: 5 (ref 5.0–8.0)

## 2017-11-08 LAB — COMPREHENSIVE METABOLIC PANEL
ALT: 48 U/L (ref 17–63)
AST: 81 U/L — ABNORMAL HIGH (ref 15–41)
Albumin: 3.5 g/dL (ref 3.5–5.0)
Alkaline Phosphatase: 66 U/L (ref 38–126)
Anion gap: 7 (ref 5–15)
BUN: 11 mg/dL (ref 6–20)
CO2: 25 mmol/L (ref 22–32)
Calcium: 8.9 mg/dL (ref 8.9–10.3)
Chloride: 109 mmol/L (ref 101–111)
Creatinine, Ser: 0.93 mg/dL (ref 0.61–1.24)
GFR calc Af Amer: >60 mL/min (ref >60)
GFR calc non Af Amer: >60 mL/min (ref >60)
Glucose, Bld: 94 mg/dL (ref 65–99)
Potassium: 4 mmol/L (ref 3.5–5.1)
Sodium: 141 mmol/L (ref 135–145)
Total Bilirubin: 1 mg/dL (ref 0.3–1.2)
Total Protein: 8.3 g/dL — ABNORMAL HIGH (ref 6.5–8.1)

## 2017-11-08 LAB — RAPID URINE DRUG SCREEN, HOSP PERFORMED
Amphetamines: NOT DETECTED
Benzodiazepines: NOT DETECTED
Cocaine: NOT DETECTED
Opiates: NOT DETECTED
Tetrahydrocannabinol: NOT DETECTED

## 2017-11-08 LAB — LIPASE, BLOOD: Lipase: 51 U/L (ref 11–51)

## 2017-11-08 LAB — ETHANOL: Alcohol, Ethyl (B): 256 mg/dL — ABNORMAL HIGH (ref <10)

## 2017-11-08 MED ORDER — SODIUM CHLORIDE 0.9 % IV BOLUS
1000.0000 mL | Freq: Once | INTRAVENOUS | Status: AC
Start: 2017-11-08 — End: 2017-11-08
  Administered 2017-11-08: 1000 mL via INTRAVENOUS

## 2017-11-08 MED ORDER — IOPAMIDOL (ISOVUE-300) INJECTION 61%
100.0000 mL | Freq: Once | INTRAVENOUS | Status: AC | PRN
  Administered 2017-11-08: 100 mL via INTRAVENOUS

## 2017-11-08 MED ORDER — IOPAMIDOL (ISOVUE-300) INJECTION 61%
INTRAVENOUS | Status: AC
  Filled 2017-11-08: qty 100

## 2017-11-08 MED ORDER — THIAMINE HCL 100 MG/ML IJ SOLN
Freq: Once | INTRAVENOUS | Status: AC
  Administered 2017-11-08: 19:00:00 via INTRAVENOUS
  Filled 2017-11-08: qty 1000

## 2017-11-08 MED ORDER — SERTRALINE HCL 100 MG PO TABS: 100.0000 mg | ORAL_TABLET | Freq: Every day | ORAL | 0 refills | Status: DC

## 2017-11-08 MED ORDER — RISPERIDONE 3 MG PO TABS: 3.0000 mg | ORAL_TABLET | Freq: Every day | ORAL | 0 refills | Status: DC
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
## 2017-11-08 NOTE — Discharge Instructions (Addendum)
You were brought to the ER after being found down outside a store.  Your alcohol level was very elevated today.  Your lab work, urine, CT of the head, CT of the abdomen and pelvis are reassuring and normal today.  You requested for refill on Risperdal and Zoloft.  Take these medications as prescribed.  Follow-up with your primary care doctor in 1 week for recheck of medications as needed refills.   Go to ID clinic to see infectious disease and AIDS doctor as soon as possible, you need to start taking your medications.   Return to the ER for headache, vision changes, chest pain, shortness of breath, blood in vomit or stools, thoughts of suicide or homicide, auditory or visual hallucinations.

## 2017-11-08 NOTE — ED Notes (Signed)
Bed: WA11 Expected date:  Expected time:  Means of arrival:  Comments: EMS abdominal pain 

## 2017-11-08 NOTE — ED Triage Notes (Signed)
EMS reports found lying in front of store, possible ETOH abuse, strong smell of alcohol reported by EMS, Pt slurring words and speaking nonsensically, stated on scene he wished to hurt himself or others. Pt c/o abdominal pain x 4 quadrants. Pt is homeless. On arrival pt denies plan to hurt himself but stated he wanted to hurt someone else.  BP 130/76 HR 80 Resp 16 Sp02 98 RA CBG 109  20ga Left hand

## 2017-11-08 NOTE — ED Notes (Signed)
Patient transported to CT 

## 2017-11-08 NOTE — ED Notes (Signed)
Pt called out requesting something to eat. Pt advised that he needs to get his CT scan performed first, and then he can have something to eat after it is resulted. Pt agrees to this plan.

## 2017-11-10 LAB — URINE CULTURE: Culture: NO GROWTH

## 2017-11-14 ENCOUNTER — Encounter: Payer: Self-pay | Admitting: Infectious Disease

## 2017-11-14 ENCOUNTER — Ambulatory Visit (INDEPENDENT_AMBULATORY_CARE_PROVIDER_SITE_OTHER): Payer: Medicare HMO | Admitting: Infectious Disease

## 2017-11-14 ENCOUNTER — Ambulatory Visit (INDEPENDENT_AMBULATORY_CARE_PROVIDER_SITE_OTHER): Payer: Medicare HMO | Admitting: Licensed Clinical Social Worker

## 2017-11-14 ENCOUNTER — Other Ambulatory Visit: Payer: Self-pay | Admitting: Behavioral Health

## 2017-11-14 ENCOUNTER — Telehealth: Payer: Self-pay

## 2017-11-14 VITALS — BP 133/94 | HR 73 | Ht 72.0 in | Wt 234.0 lb

## 2017-11-14 DIAGNOSIS — F331 Major depressive disorder, recurrent, moderate: Secondary | ICD-10-CM | POA: Diagnosis not present

## 2017-11-14 DIAGNOSIS — K625 Hemorrhage of anus and rectum: Secondary | ICD-10-CM

## 2017-11-14 DIAGNOSIS — F1920 Other psychoactive substance dependence, uncomplicated: Secondary | ICD-10-CM

## 2017-11-14 DIAGNOSIS — B20 Human immunodeficiency virus [HIV] disease: Secondary | ICD-10-CM

## 2017-11-14 DIAGNOSIS — F1410 Cocaine abuse, uncomplicated: Secondary | ICD-10-CM

## 2017-11-14 DIAGNOSIS — Z23 Encounter for immunization: Secondary | ICD-10-CM

## 2017-11-14 DIAGNOSIS — B182 Chronic viral hepatitis C: Secondary | ICD-10-CM

## 2017-11-14 DIAGNOSIS — F1021 Alcohol dependence, in remission: Secondary | ICD-10-CM | POA: Diagnosis not present

## 2017-11-14 DIAGNOSIS — F1020 Alcohol dependence, uncomplicated: Secondary | ICD-10-CM

## 2017-11-14 MED ORDER — ABACAVIR-DOLUTEGRAVIR-LAMIVUD 600-50-300 MG PO TABS: 1.0000 | ORAL_TABLET | Freq: Every day | ORAL | 11 refills | Status: DC

## 2017-11-14 MED ORDER — SULFAMETHOXAZOLE-TRIMETHOPRIM 800-160 MG PO TABS: 1.0000 | ORAL_TABLET | Freq: Every day | ORAL | 5 refills | Status: DC

## 2017-11-14 NOTE — Telephone Encounter (Signed)
I would like [name] to get better control of his alcoholism before I start prescribing him Viagra
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
## 2017-11-14 NOTE — BH Specialist Note (Signed)
Integrated Behavioral Health Initial Visit  MRN: [id number] Name: Earnestine LeysRupert D Krumholz  Number of Integrated Behavioral Health Clinician visits:: 1/6 Session Start time:9:08am Session End time: 9:28am Total time: 20 minutes  Type of Service: Integrated Behavioral Health- Individual/Family Interpretor:No. Interpretor Name and Language: n/a   Warm Hand Off Completed.       SUBJECTIVE: Earnestine LeysRupert D Icenhower is a 62 y.o. male accompanied by self Patient was referred by Dr. Daiva EvesVan Dam for alcohol dependence. Patient reports the following symptoms/concerns: alcoholism is negatively impacting his health and keeping him from being cleared for Hep C treatment.  Duration of problem: 40+ years Severity of problem: moderate  OBJECTIVE: Mood: Depressed and Affect: Blunt Risk of harm to self or others: No plan to harm self or others  LIFE CONTEXT: Patient was seen first with Dr. Daiva EvesVan Dam, and then individually by counselor. He is currently living in a car and reports that he has not had a home for a while. He states that he receives $756/month disability but uses most all of it on alcohol. Patient reports little to no support system, but is [facility] He indicates that his drinking is a problem and that he does not want to drink anymore because of what it has done to his body, including preventing treatment of Hepatitis C. The only times he has been able to abstain from drinking were 9 months when he first got married and then when he was incarcerated. Patient states he is interested in residential treatment for alcoholism.  GOALS ADDRESSED: Patient will: 1. Reduce symptoms of: substance dependence   INTERVENTIONS: Interventions utilized: Solution-Focused Strategies and Supportive Counseling    ASSESSMENT: Patient currently experiencing a desire not to drink, but struggles with actually doing that. He reports that when he abstains from drinking his hands shake, he  gets pain in the back of his head, and gets a bit confused. Most appropriate diagnosis is Alcohol Dependence, Uncomplicated Type. Patient reports a desire to attend a treatment center for help with alcoholism, and states that he has gone to [place] [facility] [place]. Counselor and patient explored options for treatment. Patient reported that he has considered [facility] but did not have transportation, and that [facility] had also [facility]. Counselor explained that [facility] can only provide detox services, and may or may not be able to get him into a treatment facility for follow-up. Patient requested that counselor contact the [facility] for him to get services started. Counsel[facility] with [facility] and was told that patient must present in person at the [facility] for assessment. Assessments are held daily at 11 am and 12 noon, Monday through Friday each week. Counselor relayed this information to patient and gave it to him in writing as well. Patient reports that he can take the [facility] bus to [place] and will report to the [facility] for assessment from there.    Patient may benefit from inpatient alcohol treatment.  PLAN: 1. Behavioral recommendations: [place] [facility] in [place] at 11am or 12 noon any day, Monday thru Friday, for assessment for alcohol treatment.   Angus Palmsegina Alexander, LCSW

## 2017-11-14 NOTE — Telephone Encounter (Signed)
Patient called from pharmacy to request a Viagra script.  He was seen today but did not have this discussion with provider.  Patient informed request will go to provider.   He will call back on Wednesday since he does not have a phone.     Laurell Josephsammy K Maryah Marinaro, RN

## 2017-11-15 IMAGING — DX DG CHEST 2V
2 series · 2 of 2 positions shown · non-contrast
Comparison: 04/25/2015

CLINICAL DATA: Cough. Possible seizure. History of COPD and dates.
Alcohol on board.

EXAM:
CHEST  2 VIEW

[chest lat]
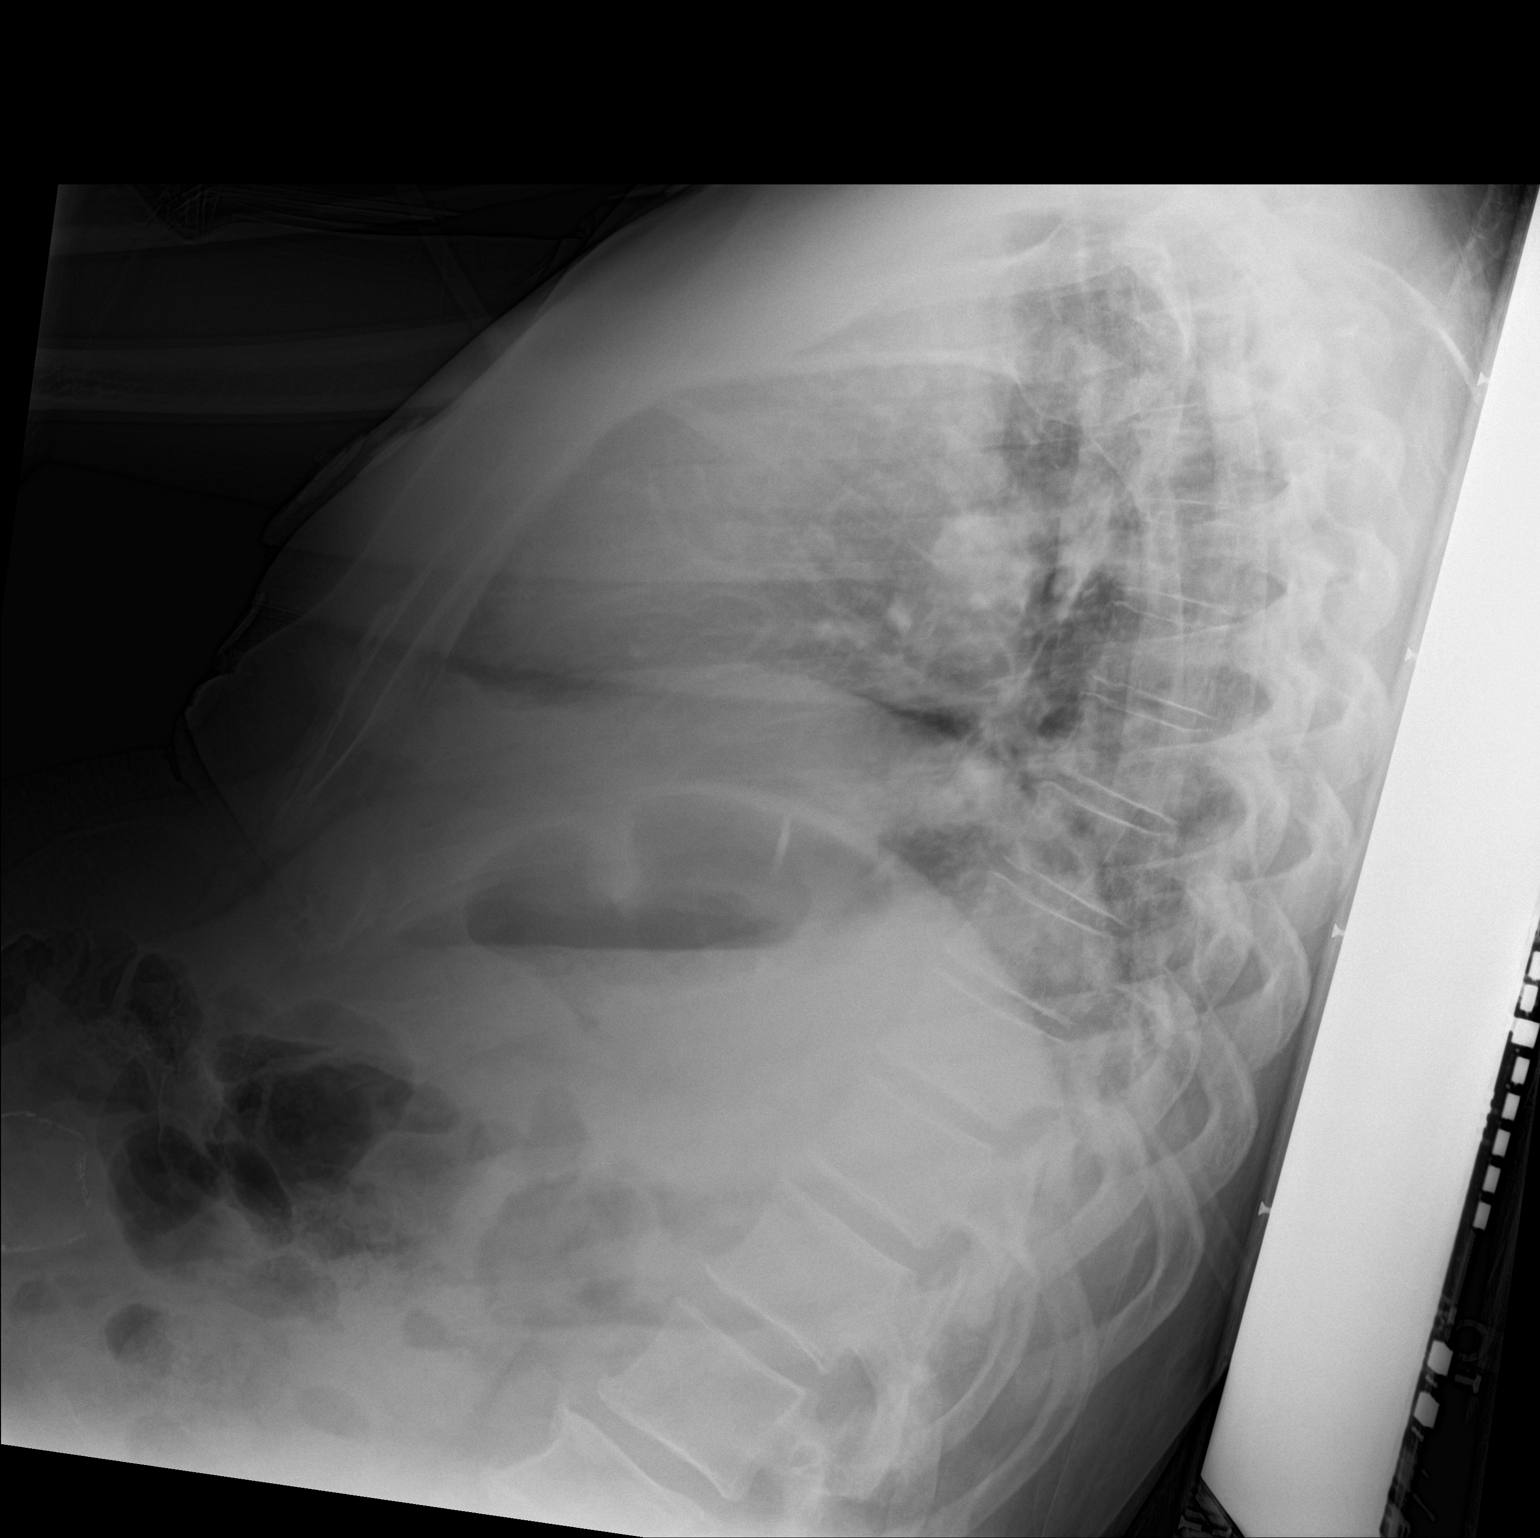

[chest ap]
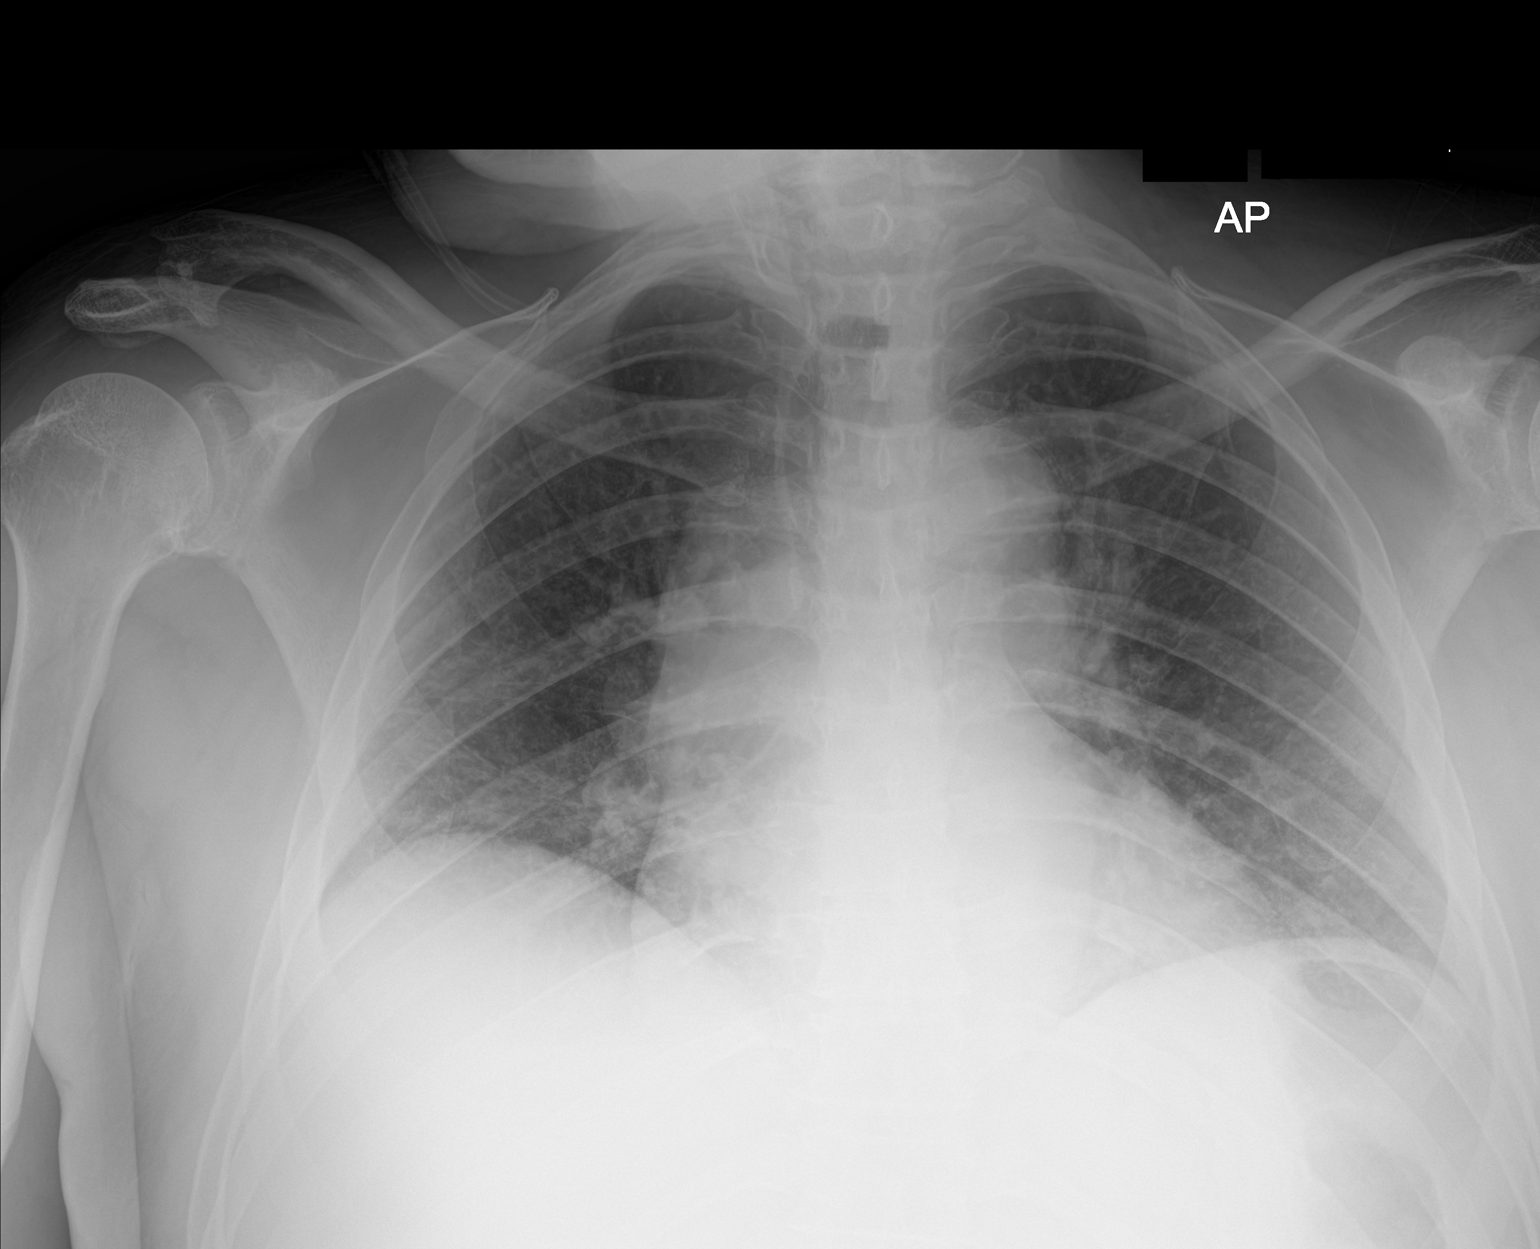

[2 of 2 positions shown; findings below may reference images not displayed]

FINDINGS: Shallow inspiration. Heart size and pulmonary vascularity are normal
for technique. Atelectasis in the lung bases. No blunting of
costophrenic angles. No pneumothorax. Tortuous aorta.
IMPRESSION: Shallow inspiration with atelectasis in the lung bases.

## 2017-11-16 LAB — IRON,TIBC AND FERRITIN PANEL
%SAT: 18 % — ABNORMAL LOW (ref 20–48)
Ferritin: 185 ng/mL (ref 24–380)
Iron: 56 ug/dL (ref 50–180)
TIBC: 317 ug/dL (ref 250–425)

## 2017-11-16 LAB — CBC WITH DIFFERENTIAL/PLATELET
Basophils Absolute: 19 cells/uL (ref 0–200)
Basophils Relative: 0.4 %
Eosinophils Absolute: 158 cells/uL (ref 15–500)
Eosinophils Relative: 3.3 %
HCT: 40.9 % (ref 38.5–50.0)
Hemoglobin: 13.7 g/dL (ref 13.2–17.1)
Lymphs Abs: 950 cells/uL (ref 850–3900)
MCH: 31.6 pg (ref 27.0–33.0)
MCHC: 33.5 g/dL (ref 32.0–36.0)
MCV: 94.2 fL (ref 80.0–100.0)
MPV: 9.9 fL (ref 7.5–12.5)
Monocytes Relative: 7.9 %
Neutro Abs: 3293 cells/uL (ref 1500–7800)
Neutrophils Relative %: 68.6 %
Platelets: 118 10*3/uL — ABNORMAL LOW (ref 140–400)
RBC: 4.34 10*6/uL (ref 4.20–5.80)
RDW: 12 % (ref 11.0–15.0)
Total Lymphocyte: 19.8 %
WBC mixed population: 379 cells/uL (ref 200–950)
WBC: 4.8 10*3/uL (ref 3.8–10.8)

## 2017-11-16 LAB — B12 AND FOLATE PANEL
Folate: 10.4 ng/mL
Vitamin B-12: 535 pg/mL (ref 200–1100)

## 2017-11-17 LAB — HEPATITIS C RNA QUANTITATIVE
HCV Quantitative Log: 6.43 Log IU/mL — ABNORMAL HIGH
HCV RNA, PCR, QN: 2670000 IU/mL — ABNORMAL HIGH

## 2018-01-31 ENCOUNTER — Encounter (HOSPITAL_COMMUNITY): Payer: Self-pay

## 2018-01-31 ENCOUNTER — Emergency Department (HOSPITAL_COMMUNITY): Payer: Medicare HMO

## 2018-01-31 ENCOUNTER — Other Ambulatory Visit: Payer: Self-pay

## 2018-01-31 ENCOUNTER — Emergency Department (HOSPITAL_COMMUNITY)
Admission: EM | Admit: 2018-01-31 | Discharge: 2018-01-31 | Disposition: A | Discharge status: Home / Self Care | Payer: Medicare HMO | Attending: Emergency Medicine | Admitting: Emergency Medicine

## 2018-01-31 DIAGNOSIS — Z5321 Procedure and treatment not carried out due to patient leaving prior to being seen by health care provider: Secondary | ICD-10-CM | POA: Insufficient documentation

## 2018-01-31 DIAGNOSIS — R51 Headache: Secondary | ICD-10-CM | POA: Diagnosis not present

## 2018-01-31 DIAGNOSIS — G8929 Other chronic pain: Secondary | ICD-10-CM

## 2018-01-31 DIAGNOSIS — J449 Chronic obstructive pulmonary disease, unspecified: Secondary | ICD-10-CM | POA: Diagnosis not present

## 2018-01-31 DIAGNOSIS — R1110 Vomiting, unspecified: Secondary | ICD-10-CM | POA: Insufficient documentation

## 2018-01-31 DIAGNOSIS — R778 Other specified abnormalities of plasma proteins: Secondary | ICD-10-CM | POA: Diagnosis not present

## 2018-01-31 DIAGNOSIS — E872 Acidosis, unspecified: Secondary | ICD-10-CM

## 2018-01-31 DIAGNOSIS — F17210 Nicotine dependence, cigarettes, uncomplicated: Secondary | ICD-10-CM | POA: Diagnosis not present

## 2018-01-31 DIAGNOSIS — R1030 Lower abdominal pain, unspecified: Secondary | ICD-10-CM | POA: Diagnosis not present

## 2018-01-31 DIAGNOSIS — Z21 Asymptomatic human immunodeficiency virus [HIV] infection status: Secondary | ICD-10-CM | POA: Insufficient documentation

## 2018-01-31 DIAGNOSIS — I10 Essential (primary) hypertension: Secondary | ICD-10-CM | POA: Insufficient documentation

## 2018-01-31 DIAGNOSIS — R569 Unspecified convulsions: Secondary | ICD-10-CM | POA: Insufficient documentation

## 2018-01-31 DIAGNOSIS — N179 Acute kidney failure, unspecified: Secondary | ICD-10-CM | POA: Insufficient documentation

## 2018-01-31 DIAGNOSIS — R7989 Other specified abnormal findings of blood chemistry: Secondary | ICD-10-CM

## 2018-01-31 DIAGNOSIS — R109 Unspecified abdominal pain: Secondary | ICD-10-CM

## 2018-01-31 LAB — RAPID URINE DRUG SCREEN, HOSP PERFORMED
Amphetamines: NOT DETECTED
Barbiturates: NOT DETECTED
Benzodiazepines: NOT DETECTED
Cocaine: NOT DETECTED
Opiates: NOT DETECTED
Tetrahydrocannabinol: NOT DETECTED

## 2018-01-31 LAB — CBC WITH DIFFERENTIAL/PLATELET
Basophils Absolute: 0 10*3/uL (ref 0.0–0.1)
Basophils Relative: 0 %
Eosinophils Absolute: 0 10*3/uL (ref 0.0–0.7)
Eosinophils Relative: 1 %
HCT: 39.2 % (ref 39.0–52.0)
Hemoglobin: 13.2 g/dL (ref 13.0–17.0)
Lymphocytes Relative: 15 %
Lymphs Abs: 0.8 10*3/uL (ref 0.7–4.0)
MCH: 31.7 pg (ref 26.0–34.0)
MCHC: 33.7 g/dL (ref 30.0–36.0)
MCV: 94 fL (ref 78.0–100.0)
Monocytes Absolute: 0.3 10*3/uL (ref 0.1–1.0)
Monocytes Relative: 6 %
Neutro Abs: 4.4 10*3/uL (ref 1.7–7.7)
Neutrophils Relative %: 78 %
Platelets: 128 10*3/uL — ABNORMAL LOW (ref 150–400)
RBC: 4.17 MIL/uL — ABNORMAL LOW (ref 4.22–5.81)
RDW: 13.5 % (ref 11.5–15.5)
WBC: 5.6 10*3/uL (ref 4.0–10.5)

## 2018-01-31 LAB — COMPREHENSIVE METABOLIC PANEL
ALT: 41 U/L (ref 0–44)
AST: 59 U/L — ABNORMAL HIGH (ref 15–41)
Albumin: 3.1 g/dL — ABNORMAL LOW (ref 3.5–5.0)
Alkaline Phosphatase: 56 U/L (ref 38–126)
Anion gap: 12 (ref 5–15)
BUN: 14 mg/dL (ref 8–23)
CO2: 24 mmol/L (ref 22–32)
Calcium: 9 mg/dL (ref 8.9–10.3)
Chloride: 104 mmol/L (ref 98–111)
Creatinine, Ser: 1.47 mg/dL — ABNORMAL HIGH (ref 0.61–1.24)
GFR calc Af Amer: 56 mL/min — ABNORMAL LOW (ref >60)
GFR calc non Af Amer: 49 mL/min — ABNORMAL LOW (ref >60)
Glucose, Bld: 133 mg/dL — ABNORMAL HIGH (ref 70–99)
Potassium: 3.4 mmol/L — ABNORMAL LOW (ref 3.5–5.1)
Sodium: 140 mmol/L (ref 135–145)
Total Bilirubin: 0.7 mg/dL (ref 0.3–1.2)
Total Protein: 7.4 g/dL (ref 6.5–8.1)

## 2018-01-31 LAB — CBG MONITORING, ED: Glucose-Capillary: 112 mg/dL — ABNORMAL HIGH (ref 70–99)

## 2018-01-31 LAB — LIPASE, BLOOD: Lipase: 29 U/L (ref 11–51)

## 2018-01-31 LAB — ETHANOL: Alcohol, Ethyl (B): 22 mg/dL — ABNORMAL HIGH (ref <10)

## 2018-01-31 LAB — URINALYSIS, ROUTINE W REFLEX MICROSCOPIC
Bilirubin Urine: NEGATIVE
Glucose, UA: NEGATIVE mg/dL
Hgb urine dipstick: NEGATIVE
Ketones, ur: NEGATIVE mg/dL
Leukocytes, UA: NEGATIVE
Nitrite: NEGATIVE
Protein, ur: NEGATIVE mg/dL
Specific Gravity, Urine: 1.009 (ref 1.005–1.030)
pH: 6 (ref 5.0–8.0)

## 2018-01-31 LAB — D-DIMER, QUANTITATIVE (NOT AT ARMC): D-Dimer, Quant: 0.65 ug/mL-FEU — ABNORMAL HIGH (ref 0.00–0.50)

## 2018-01-31 LAB — I-STAT CG4 LACTIC ACID, ED: Lactic Acid, Venous: 3.53 mmol/L (ref 0.5–1.9)

## 2018-01-31 LAB — TROPONIN I: Troponin I: 0.05 ng/mL (ref <0.03)

## 2018-01-31 MED ORDER — SODIUM CHLORIDE 0.9 % IV BOLUS
1000.0000 mL | Freq: Once | INTRAVENOUS | Status: AC
  Administered 2018-01-31: 1000 mL via INTRAVENOUS

## 2018-01-31 MED ORDER — LACTATED RINGERS IV BOLUS
1000.0000 mL | Freq: Once | INTRAVENOUS | Status: AC
  Administered 2018-01-31: 1000 mL via INTRAVENOUS

## 2018-01-31 MED ORDER — POTASSIUM CHLORIDE CRYS ER 20 MEQ PO TBCR
20.0000 meq | EXTENDED_RELEASE_TABLET | Freq: Once | ORAL | Status: AC
  Administered 2018-01-31: 20 meq via ORAL
  Filled 2018-01-31: qty 1

## 2018-01-31 NOTE — ED Notes (Addendum)
Pt got up out of bed, began dressing and wanting to leave.  This RN alerted [place] NT.  Pt was told previously by this nurse to use the call bell before getting out of bed so to not pull his IV out or to fall.  Dr Gwenlyn Fudge came into room to talk to him.  pt decided he wants to leave AMA.  Pt agreed to finish the liter of LR first.

## 2018-01-31 NOTE — ED Notes (Signed)
RN Maddie alerted this Clinical research associate that patient had gotten out of the bed and was sitting in the chair in the room and putting is emesis covered clothes back on and I asked him, "[name], what are you doing? It's not time to go yet." He stated " I have to go, I called my friend and something is 'going on' I have to go." I alerted EDP Criss Alvine and RN Maddie. EDP Criss Alvine was at bedside along with RN.

## 2018-01-31 NOTE — Discharge Instructions (Addendum)
You are leaving AGAINST MEDICAL ADVICE.  We discussed that leaving prior to fully being treated and evaluated could lead to death, heart attack, blood clots in the lungs, an acute abdominal infection, or long-term effects/disability from any or all of the above.  You may return to the ER at any time and are encouraged to do so, especially if you start to feel worse. Otherwise follow up with your doctor in the next 1-2 days. Drink plenty of fluids.
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
## 2018-01-31 NOTE — ED Notes (Signed)
Critical troponin 0.05  Called at 1415 MD made aware

## 2018-01-31 NOTE — ED Notes (Signed)
ED provider Criss Alvine made aware patient has critical Lactic Acid value of 3.53

## 2018-02-02 ENCOUNTER — Emergency Department (HOSPITAL_COMMUNITY): Payer: Medicare HMO

## 2018-02-02 ENCOUNTER — Encounter (HOSPITAL_COMMUNITY): Payer: Self-pay | Admitting: Emergency Medicine

## 2018-02-02 ENCOUNTER — Emergency Department (HOSPITAL_COMMUNITY)
Admission: EM | Admit: 2018-02-02 | Discharge: 2018-02-02 | Disposition: A | Discharge status: Home / Self Care | Payer: Medicare HMO | Attending: Emergency Medicine | Admitting: Emergency Medicine

## 2018-02-02 DIAGNOSIS — Z79899 Other long term (current) drug therapy: Secondary | ICD-10-CM | POA: Insufficient documentation

## 2018-02-02 DIAGNOSIS — R0789 Other chest pain: Secondary | ICD-10-CM | POA: Diagnosis not present

## 2018-02-02 DIAGNOSIS — F17210 Nicotine dependence, cigarettes, uncomplicated: Secondary | ICD-10-CM | POA: Diagnosis not present

## 2018-02-02 DIAGNOSIS — I10 Essential (primary) hypertension: Secondary | ICD-10-CM | POA: Diagnosis not present

## 2018-02-02 DIAGNOSIS — J45909 Unspecified asthma, uncomplicated: Secondary | ICD-10-CM | POA: Diagnosis not present

## 2018-02-02 LAB — CBC
HCT: 38.2 % — ABNORMAL LOW (ref 39.0–52.0)
Hemoglobin: 12.5 g/dL — ABNORMAL LOW (ref 13.0–17.0)
MCH: 31.6 pg (ref 26.0–34.0)
MCHC: 32.7 g/dL (ref 30.0–36.0)
MCV: 96.5 fL (ref 78.0–100.0)
Platelets: 110 10*3/uL — ABNORMAL LOW (ref 150–400)
RBC: 3.96 MIL/uL — ABNORMAL LOW (ref 4.22–5.81)
RDW: 13.5 % (ref 11.5–15.5)
WBC: 7.8 10*3/uL (ref 4.0–10.5)

## 2018-02-02 LAB — BASIC METABOLIC PANEL
Anion gap: 12 (ref 5–15)
BUN: 10 mg/dL (ref 8–23)
CO2: 22 mmol/L (ref 22–32)
Calcium: 8.7 mg/dL — ABNORMAL LOW (ref 8.9–10.3)
Chloride: 102 mmol/L (ref 98–111)
Creatinine, Ser: 0.96 mg/dL (ref 0.61–1.24)
GFR calc Af Amer: >60 mL/min (ref >60)
GFR calc non Af Amer: >60 mL/min (ref >60)
Glucose, Bld: 92 mg/dL (ref 70–99)
Potassium: 3.3 mmol/L — ABNORMAL LOW (ref 3.5–5.1)
Sodium: 136 mmol/L (ref 135–145)

## 2018-02-02 LAB — HEPATIC FUNCTION PANEL
ALT: 37 U/L (ref 0–44)
AST: 88 U/L — ABNORMAL HIGH (ref 15–41)
Albumin: 3.3 g/dL — ABNORMAL LOW (ref 3.5–5.0)
Alkaline Phosphatase: 61 U/L (ref 38–126)
Bilirubin, Direct: 0.4 mg/dL — ABNORMAL HIGH (ref 0.0–0.2)
Indirect Bilirubin: 0.9 mg/dL (ref 0.3–0.9)
Total Bilirubin: 1.3 mg/dL — ABNORMAL HIGH (ref 0.3–1.2)
Total Protein: 7.3 g/dL (ref 6.5–8.1)

## 2018-02-02 LAB — I-STAT TROPONIN, ED: Troponin i, poc: 0.02 ng/mL (ref 0.00–0.08)

## 2018-02-02 NOTE — ED Notes (Signed)
Discharge instructions discussed with Pt. Pt verbalized understanding. Pt stable and ambulatory.    

## 2018-02-02 NOTE — ED Notes (Signed)
Pt given a turkey sandwich and ginger ale 

## 2018-02-02 NOTE — ED Notes (Signed)
Pt keeps getting out of bed, pt wad told he is a fall risk and to stay in the bed.

## 2018-02-02 NOTE — ED Notes (Signed)
Pt shouting inappropriate comments to nurses about breasts and butts, cursing and telling EMS to "get your white arm off of me!". Pt reports he's been doing cocaine.
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
## 2018-02-02 NOTE — ED Notes (Signed)
Patient transported to X-ray 
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
## 2018-02-08 ENCOUNTER — Encounter (HOSPITAL_COMMUNITY): Payer: Self-pay

## 2018-02-08 ENCOUNTER — Emergency Department (HOSPITAL_COMMUNITY)
Admission: EM | Admit: 2018-02-08 | Discharge: 2018-02-08 | Disposition: A | Discharge status: Home / Self Care | Payer: Medicare HMO | Attending: Emergency Medicine | Admitting: Emergency Medicine

## 2018-02-08 DIAGNOSIS — Z79899 Other long term (current) drug therapy: Secondary | ICD-10-CM | POA: Insufficient documentation

## 2018-02-08 DIAGNOSIS — B20 Human immunodeficiency virus [HIV] disease: Secondary | ICD-10-CM | POA: Insufficient documentation

## 2018-02-08 DIAGNOSIS — J449 Chronic obstructive pulmonary disease, unspecified: Secondary | ICD-10-CM | POA: Diagnosis not present

## 2018-02-08 DIAGNOSIS — F1410 Cocaine abuse, uncomplicated: Secondary | ICD-10-CM | POA: Insufficient documentation

## 2018-02-08 DIAGNOSIS — Z590 Homelessness: Secondary | ICD-10-CM | POA: Insufficient documentation

## 2018-02-08 DIAGNOSIS — F17210 Nicotine dependence, cigarettes, uncomplicated: Secondary | ICD-10-CM | POA: Diagnosis not present

## 2018-02-08 DIAGNOSIS — I10 Essential (primary) hypertension: Secondary | ICD-10-CM | POA: Diagnosis not present

## 2018-02-08 DIAGNOSIS — F10129 Alcohol abuse with intoxication, unspecified: Secondary | ICD-10-CM | POA: Diagnosis present

## 2018-02-08 DIAGNOSIS — F10920 Alcohol use, unspecified with intoxication, uncomplicated: Secondary | ICD-10-CM | POA: Diagnosis not present

## 2018-02-08 DIAGNOSIS — F1210 Cannabis abuse, uncomplicated: Secondary | ICD-10-CM | POA: Diagnosis not present
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
## 2018-02-08 NOTE — ED Notes (Signed)
Bed: WHALB Expected date:  Expected time:  Means of arrival:  Comments: 

## 2018-02-08 NOTE — ED Notes (Signed)
Pt ambulated to restroom without difficulty. Steady gait noted.

## 2018-02-08 NOTE — ED Notes (Signed)
Unable to obtain signature or vitals due to pt behaviors.

## 2018-02-09 ENCOUNTER — Other Ambulatory Visit: Payer: Self-pay

## 2018-02-09 ENCOUNTER — Encounter (HOSPITAL_COMMUNITY): Payer: Self-pay

## 2018-02-09 ENCOUNTER — Emergency Department (HOSPITAL_COMMUNITY): Payer: Medicare HMO

## 2018-02-09 ENCOUNTER — Emergency Department (HOSPITAL_COMMUNITY)
Admission: EM | Admit: 2018-02-09 | Discharge: 2018-02-10 | Disposition: A | Discharge status: Home / Self Care | Payer: Medicare HMO | Attending: Emergency Medicine | Admitting: Emergency Medicine

## 2018-02-09 DIAGNOSIS — Z79899 Other long term (current) drug therapy: Secondary | ICD-10-CM | POA: Insufficient documentation

## 2018-02-09 DIAGNOSIS — I712 Thoracic aortic aneurysm, without rupture, unspecified: Secondary | ICD-10-CM

## 2018-02-09 DIAGNOSIS — R072 Precordial pain: Secondary | ICD-10-CM

## 2018-02-09 DIAGNOSIS — F17210 Nicotine dependence, cigarettes, uncomplicated: Secondary | ICD-10-CM | POA: Insufficient documentation

## 2018-02-09 DIAGNOSIS — J449 Chronic obstructive pulmonary disease, unspecified: Secondary | ICD-10-CM | POA: Diagnosis not present

## 2018-02-09 LAB — I-STAT TROPONIN, ED: Troponin i, poc: 0.01 ng/mL (ref 0.00–0.08)

## 2018-02-09 NOTE — ED Triage Notes (Signed)
Pt BIB [facility] for eval of ETOH intox, chest/back pain chronic in nature. Pt was called out by EMS by [facility]. Pt is significantly intox on arrival, GCS 15 A&Ox4. Denies SOB, endorses chronic ongoing CP.

## 2018-02-10 ENCOUNTER — Emergency Department (HOSPITAL_COMMUNITY): Payer: Medicare HMO

## 2018-02-10 DIAGNOSIS — R072 Precordial pain: Secondary | ICD-10-CM | POA: Diagnosis not present

## 2018-02-10 LAB — D-DIMER, QUANTITATIVE (NOT AT ARMC): D-Dimer, Quant: 0.83 ug/mL-FEU — ABNORMAL HIGH (ref 0.00–0.50)

## 2018-02-10 LAB — CBC
HCT: 40.5 % (ref 39.0–52.0)
Hemoglobin: 13 g/dL (ref 13.0–17.0)
MCH: 31.5 pg (ref 26.0–34.0)
MCHC: 32.1 g/dL (ref 30.0–36.0)
MCV: 98.1 fL (ref 78.0–100.0)
Platelets: 118 10*3/uL — ABNORMAL LOW (ref 150–400)
RBC: 4.13 MIL/uL — ABNORMAL LOW (ref 4.22–5.81)
RDW: 14.7 % (ref 11.5–15.5)
WBC: 6.7 10*3/uL (ref 4.0–10.5)

## 2018-02-10 LAB — BASIC METABOLIC PANEL
Anion gap: 10 (ref 5–15)
BUN: 14 mg/dL (ref 8–23)
CO2: 21 mmol/L — ABNORMAL LOW (ref 22–32)
Calcium: 7.8 mg/dL — ABNORMAL LOW (ref 8.9–10.3)
Chloride: 103 mmol/L (ref 98–111)
Creatinine, Ser: 0.89 mg/dL (ref 0.61–1.24)
GFR calc Af Amer: >60 mL/min (ref >60)
GFR calc non Af Amer: >60 mL/min (ref >60)
Glucose, Bld: 92 mg/dL (ref 70–99)
Potassium: 5.3 mmol/L — ABNORMAL HIGH (ref 3.5–5.1)
Sodium: 134 mmol/L — ABNORMAL LOW (ref 135–145)

## 2018-02-10 LAB — ETHANOL: Alcohol, Ethyl (B): 164 mg/dL — ABNORMAL HIGH (ref <10)

## 2018-02-10 MED ORDER — IOPAMIDOL (ISOVUE-370) INJECTION 76%
100.0000 mL | Freq: Once | INTRAVENOUS | Status: AC | PRN
  Administered 2018-02-10: 100 mL via INTRAVENOUS

## 2018-02-10 MED ORDER — IOPAMIDOL (ISOVUE-370) INJECTION 76%
INTRAVENOUS | Status: AC
  Filled 2018-02-10: qty 100

## 2018-02-10 MED ORDER — KETOROLAC TROMETHAMINE 30 MG/ML IJ SOLN
30.0000 mg | Freq: Once | INTRAMUSCULAR | Status: AC
  Administered 2018-02-10: 30 mg via INTRAMUSCULAR
  Filled 2018-02-10: qty 1

## 2018-02-10 MED ORDER — GI COCKTAIL ~~LOC~~
30.0000 mL | Freq: Once | ORAL | Status: AC
  Administered 2018-02-10: 30 mL via ORAL
  Filled 2018-02-10: qty 30

## 2018-02-10 NOTE — ED Notes (Signed)
I called main lab to add on d-dimer,  Lab tech receiving and processing blood test now.
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
## 2018-02-14 ENCOUNTER — Other Ambulatory Visit: Payer: Self-pay

## 2018-02-22 ENCOUNTER — Other Ambulatory Visit: Payer: Self-pay

## 2018-02-22 ENCOUNTER — Emergency Department (HOSPITAL_COMMUNITY)
Admission: EM | Admit: 2018-02-22 | Discharge: 2018-02-23 | Disposition: A | Discharge status: Home / Self Care | Payer: Medicare HMO | Attending: Emergency Medicine | Admitting: Emergency Medicine

## 2018-02-22 ENCOUNTER — Emergency Department (HOSPITAL_COMMUNITY): Payer: Medicare HMO

## 2018-02-22 DIAGNOSIS — F17210 Nicotine dependence, cigarettes, uncomplicated: Secondary | ICD-10-CM | POA: Insufficient documentation

## 2018-02-22 DIAGNOSIS — J449 Chronic obstructive pulmonary disease, unspecified: Secondary | ICD-10-CM | POA: Diagnosis not present

## 2018-02-22 DIAGNOSIS — F10920 Alcohol use, unspecified with intoxication, uncomplicated: Secondary | ICD-10-CM

## 2018-02-22 DIAGNOSIS — B20 Human immunodeficiency virus [HIV] disease: Secondary | ICD-10-CM | POA: Diagnosis not present

## 2018-02-22 NOTE — ED Triage Notes (Signed)
Pt bib [facility] from [facility] [employer] with c/o of alcohol intoxication. Pt was yelling and band from the [employer] and [facility] and [facility] was called out. Pt was stumbling and fell over so [facility] put a c-collar on. Pt has small abrasion on his left arm, denies falling but is intoxicated. Pt is intoxicated and yelling currently. Pt has hx of Bipolar, Alcohol abuse, Drug abuse, AIDS, and COPD.
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
## 2018-02-22 NOTE — Discharge Instructions (Signed)
If you develop worsening or new concerning symptoms you can return to the emergency department for re-evaluation.  ° °

## 2018-03-01 ENCOUNTER — Encounter: Payer: Self-pay | Admitting: Infectious Disease

## 2018-08-01 ENCOUNTER — Other Ambulatory Visit: Payer: Self-pay

## 2018-08-01 ENCOUNTER — Other Ambulatory Visit: Payer: Medicare HMO

## 2018-08-01 DIAGNOSIS — B20 Human immunodeficiency virus [HIV] disease: Secondary | ICD-10-CM

## 2018-08-02 LAB — T-HELPER CELL (CD4) - (RCID CLINIC ONLY)
CD4 % Helper T Cell: 10 % — ABNORMAL LOW (ref 33–55)
CD4 T Cell Abs: 90 /uL — ABNORMAL LOW (ref 400–2700)

## 2018-08-03 LAB — COMPLETE METABOLIC PANEL WITH GFR
AG Ratio: 0.8 (calc) — ABNORMAL LOW (ref 1.0–2.5)
ALT: 31 U/L (ref 9–46)
AST: 56 U/L — ABNORMAL HIGH (ref 10–35)
Albumin: 3.6 g/dL (ref 3.6–5.1)
Alkaline phosphatase (APISO): 61 U/L (ref 35–144)
BUN: 11 mg/dL (ref 7–25)
CO2: 27 mmol/L (ref 20–32)
Calcium: 9.1 mg/dL (ref 8.6–10.3)
Chloride: 104 mmol/L (ref 98–110)
Creat: 0.93 mg/dL (ref 0.70–1.25)
GFR, Est African American: 99 mL/min/{1.73_m2} (ref >=60)
GFR, Est Non African American: 86 mL/min/{1.73_m2} (ref >=60)
Globulin: 4.3 g/dL (calc) — ABNORMAL HIGH (ref 1.9–3.7)
Glucose, Bld: 114 mg/dL — ABNORMAL HIGH (ref 65–99)
Potassium: 3.7 mmol/L (ref 3.5–5.3)
Sodium: 139 mmol/L (ref 135–146)
Total Bilirubin: 1.5 mg/dL — ABNORMAL HIGH (ref 0.2–1.2)
Total Protein: 7.9 g/dL (ref 6.1–8.1)

## 2018-08-03 LAB — RPR: RPR Ser Ql: NONREACTIVE

## 2018-08-03 LAB — CBC WITH DIFFERENTIAL/PLATELET
Absolute Monocytes: 476 cells/uL (ref 200–950)
Basophils Absolute: 17 cells/uL (ref 0–200)
Basophils Relative: 0.3 %
Eosinophils Absolute: 128 cells/uL (ref 15–500)
Eosinophils Relative: 2.2 %
HCT: 38.1 % — ABNORMAL LOW (ref 38.5–50.0)
Hemoglobin: 13 g/dL — ABNORMAL LOW (ref 13.2–17.1)
Lymphs Abs: 835 cells/uL — ABNORMAL LOW (ref 850–3900)
MCH: 32.4 pg (ref 27.0–33.0)
MCHC: 34.1 g/dL (ref 32.0–36.0)
MCV: 95 fL (ref 80.0–100.0)
MPV: 10.5 fL (ref 7.5–12.5)
Monocytes Relative: 8.2 %
Neutro Abs: 4344 cells/uL (ref 1500–7800)
Neutrophils Relative %: 74.9 %
Platelets: 117 10*3/uL — ABNORMAL LOW (ref 140–400)
RBC: 4.01 10*6/uL — ABNORMAL LOW (ref 4.20–5.80)
RDW: 11.9 % (ref 11.0–15.0)
Total Lymphocyte: 14.4 %
WBC: 5.8 10*3/uL (ref 3.8–10.8)

## 2018-08-03 LAB — HIV-1 RNA QUANT-NO REFLEX-BLD
HIV 1 RNA Quant: <20 copies/mL — AB
HIV-1 RNA Quant, Log: <1.3 Log copies/mL — AB

## 2018-08-04 ENCOUNTER — Encounter (HOSPITAL_COMMUNITY): Payer: Self-pay | Admitting: Emergency Medicine

## 2018-08-04 ENCOUNTER — Other Ambulatory Visit: Payer: Self-pay

## 2018-08-04 ENCOUNTER — Emergency Department (HOSPITAL_COMMUNITY)
Admission: EM | Admit: 2018-08-04 | Discharge: 2018-08-05 | Disposition: A | Discharge status: Home / Self Care | Payer: Medicare HMO | Attending: Emergency Medicine | Admitting: Emergency Medicine

## 2018-08-04 DIAGNOSIS — I10 Essential (primary) hypertension: Secondary | ICD-10-CM | POA: Diagnosis not present

## 2018-08-04 DIAGNOSIS — F17210 Nicotine dependence, cigarettes, uncomplicated: Secondary | ICD-10-CM | POA: Insufficient documentation

## 2018-08-04 DIAGNOSIS — Z79899 Other long term (current) drug therapy: Secondary | ICD-10-CM | POA: Diagnosis not present

## 2018-08-04 DIAGNOSIS — F332 Major depressive disorder, recurrent severe without psychotic features: Secondary | ICD-10-CM | POA: Diagnosis not present

## 2018-08-04 DIAGNOSIS — F10120 Alcohol abuse with intoxication, uncomplicated: Secondary | ICD-10-CM | POA: Insufficient documentation

## 2018-08-04 DIAGNOSIS — B20 Human immunodeficiency virus [HIV] disease: Secondary | ICD-10-CM | POA: Diagnosis not present

## 2018-08-04 DIAGNOSIS — R45851 Suicidal ideations: Secondary | ICD-10-CM | POA: Diagnosis not present

## 2018-08-04 DIAGNOSIS — J449 Chronic obstructive pulmonary disease, unspecified: Secondary | ICD-10-CM | POA: Diagnosis not present

## 2018-08-04 DIAGNOSIS — F10920 Alcohol use, unspecified with intoxication, uncomplicated: Secondary | ICD-10-CM
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
## 2018-08-05 ENCOUNTER — Emergency Department (HOSPITAL_COMMUNITY): Payer: Medicare HMO

## 2018-08-05 DIAGNOSIS — F332 Major depressive disorder, recurrent severe without psychotic features: Secondary | ICD-10-CM | POA: Diagnosis not present

## 2018-08-05 LAB — COMPREHENSIVE METABOLIC PANEL
ALT: 40 U/L (ref 0–44)
AST: 84 U/L — ABNORMAL HIGH (ref 15–41)
Albumin: 3.4 g/dL — ABNORMAL LOW (ref 3.5–5.0)
Alkaline Phosphatase: 55 U/L (ref 38–126)
Anion gap: 12 (ref 5–15)
BUN: 14 mg/dL (ref 8–23)
CO2: 20 mmol/L — ABNORMAL LOW (ref 22–32)
Calcium: 8.8 mg/dL — ABNORMAL LOW (ref 8.9–10.3)
Chloride: 108 mmol/L (ref 98–111)
Creatinine, Ser: 0.89 mg/dL (ref 0.61–1.24)
GFR calc Af Amer: >60 mL/min (ref >60)
GFR calc non Af Amer: >60 mL/min (ref >60)
Glucose, Bld: 98 mg/dL (ref 70–99)
Potassium: 3.9 mmol/L (ref 3.5–5.1)
Sodium: 140 mmol/L (ref 135–145)
Total Bilirubin: 1 mg/dL (ref 0.3–1.2)
Total Protein: 8.6 g/dL — ABNORMAL HIGH (ref 6.5–8.1)

## 2018-08-05 LAB — RAPID URINE DRUG SCREEN, HOSP PERFORMED
Amphetamines: NOT DETECTED
Barbiturates: NOT DETECTED
Benzodiazepines: NOT DETECTED
Cocaine: NOT DETECTED
Opiates: NOT DETECTED
Tetrahydrocannabinol: NOT DETECTED

## 2018-08-05 LAB — CBC WITH DIFFERENTIAL/PLATELET
Abs Immature Granulocytes: 0.02 10*3/uL (ref 0.00–0.07)
Basophils Absolute: 0 10*3/uL (ref 0.0–0.1)
Basophils Relative: 1 %
Eosinophils Absolute: 0.1 10*3/uL (ref 0.0–0.5)
Eosinophils Relative: 3 %
HCT: 40.4 % (ref 39.0–52.0)
Hemoglobin: 12.5 g/dL — ABNORMAL LOW (ref 13.0–17.0)
Immature Granulocytes: 0 %
Lymphocytes Relative: 25 %
Lymphs Abs: 1.3 10*3/uL (ref 0.7–4.0)
MCH: 31.4 pg (ref 26.0–34.0)
MCHC: 30.9 g/dL (ref 30.0–36.0)
MCV: 101.5 fL — ABNORMAL HIGH (ref 80.0–100.0)
Monocytes Absolute: 0.3 10*3/uL (ref 0.1–1.0)
Monocytes Relative: 6 %
Neutro Abs: 3.4 10*3/uL (ref 1.7–7.7)
Neutrophils Relative %: 65 %
Platelets: 126 10*3/uL — ABNORMAL LOW (ref 150–400)
RBC: 3.98 MIL/uL — ABNORMAL LOW (ref 4.22–5.81)
RDW: 13.6 % (ref 11.5–15.5)
WBC: 5.1 10*3/uL (ref 4.0–10.5)
nRBC: 0 % (ref 0.0–0.2)

## 2018-08-05 LAB — SALICYLATE LEVEL: Salicylate Lvl: <7 mg/dL (ref 2.8–30.0)

## 2018-08-05 LAB — ACETAMINOPHEN LEVEL: Acetaminophen (Tylenol), Serum: <10 ug/mL — ABNORMAL LOW (ref 10–30)

## 2018-08-05 LAB — TROPONIN I
Troponin I: <0.03 ng/mL (ref <0.03)
Troponin I: <0.03 ng/mL (ref <0.03)

## 2018-08-05 LAB — ETHANOL: Alcohol, Ethyl (B): 184 mg/dL — ABNORMAL HIGH (ref <10)

## 2018-08-05 MED ORDER — LORAZEPAM 2 MG/ML IJ SOLN: 0.0000 mg | Freq: Two times a day (BID) | INTRAMUSCULAR | Status: DC

## 2018-08-05 MED ORDER — LORAZEPAM 2 MG/ML IJ SOLN: 0.0000 mg | Freq: Four times a day (QID) | INTRAMUSCULAR | Status: DC

## 2018-08-05 MED ORDER — LORAZEPAM 1 MG PO TABS: 0.0000 mg | ORAL_TABLET | Freq: Four times a day (QID) | ORAL | Status: DC

## 2018-08-05 MED ORDER — HYDROXYZINE HCL 25 MG PO TABS: 25.0000 mg | ORAL_TABLET | Freq: Four times a day (QID) | ORAL | Status: DC | PRN

## 2018-08-05 MED ORDER — VITAMIN B-1 100 MG PO TABS
100.0000 mg | ORAL_TABLET | Freq: Every day | ORAL | Status: DC
  Administered 2018-08-05: 100 mg via ORAL
  Filled 2018-08-05: qty 1

## 2018-08-05 MED ORDER — THIAMINE HCL 100 MG/ML IJ SOLN: 100.0000 mg | Freq: Every day | INTRAMUSCULAR | Status: DC

## 2018-08-05 MED ORDER — SERTRALINE HCL 100 MG PO TABS
100.0000 mg | ORAL_TABLET | Freq: Every day | ORAL | Status: DC
  Filled 2018-08-05: qty 1

## 2018-08-05 MED ORDER — ABACAVIR-DOLUTEGRAVIR-LAMIVUD 600-50-300 MG PO TABS
1.0000 | ORAL_TABLET | Freq: Every day | ORAL | Status: DC
  Filled 2018-08-05: qty 1

## 2018-08-05 MED ORDER — TRAZODONE HCL 50 MG PO TABS: 50.0000 mg | ORAL_TABLET | Freq: Every evening | ORAL | Status: DC | PRN

## 2018-08-05 MED ORDER — RISPERIDONE 3 MG PO TABS: 3.0000 mg | ORAL_TABLET | Freq: Every day | ORAL | Status: DC

## 2018-08-05 MED ORDER — GABAPENTIN 100 MG PO CAPS
100.0000 mg | ORAL_CAPSULE | Freq: Three times a day (TID) | ORAL | Status: DC
  Administered 2018-08-05: 100 mg via ORAL
  Filled 2018-08-05: qty 1

## 2018-08-05 MED ORDER — LORAZEPAM 1 MG PO TABS: 0.0000 mg | ORAL_TABLET | Freq: Two times a day (BID) | ORAL | Status: DC
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
## 2018-08-05 NOTE — ED Notes (Signed)
Pt has been eating and drinking without difficulty. Pt also ambulated to and from hallway bathroom with standby assistance only.

## 2018-08-05 NOTE — Progress Notes (Signed)
Patient is seen by me via tele-psych and have consulted with Dr. Lucianne Muss.  Patient denies any suicidal homicidal ideations and denies any hallucinations.  Patient reports that when he drinks too much she does feel sad and has made comments in the past about suicide.  He states that what he really wants is to go to rehab so he can stop drinking.  He states that he will be safe if he is discharged and currently he has a place to stay.He is requesting rehab resources and I have requested TTS disposition staff to fax that information over. At this time the patient does not meet inpatient criteria and is psychiatrically cleared. I have contacted Dr. Anitra Lauth and notified her of the recommendations.

## 2018-08-05 NOTE — ED Notes (Signed)
Pt changed to wine colored scrubs. Pt requesting another sandwich and ginger ale. OK per RN Huntley Dec pt given the same even though pt is sleeping

## 2018-08-05 NOTE — ED Notes (Signed)
EMT drawing labs 
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
## 2018-08-05 NOTE — Progress Notes (Signed)
Pt provided clinician the names and phone numbers of his sister, Dr. Durward Parcel, and his girlfriend, Gwenyth Allegra, so clinician could contact them for collateral. Dr. Lowanda Foster number has been disconnected [phone]); clinician left a HIPAA-compliant message on Ms. Poole's voicemail requesting she return my call [phone]).  Dr. Durward Parcel, pt's sister: [phone] - disconnected Gwenyth Allegra, pt's girlfriend: [phone]

## 2018-08-05 NOTE — ED Notes (Signed)
Pt to [facility]. TTS device at bedside, assessment underway.

## 2018-08-05 NOTE — ED Provider Notes (Signed)
Patient is now no longer intoxicated and is denying any suicidal or homicidal ideation.  TTS reevaluated and feel that he is cleared ago.  He states that when he drinks sometimes that he will become sad and say things like that but he does not mean it.  He was given resources for help with alcohol abuse.   Gwyneth Sprout, MD 08/05/18 1428

## 2018-08-05 NOTE — ED Notes (Signed)
Patient verbalizes understanding of discharge instructions. Opportunity for questioning and answers were provided. Armband removed by staff, pt discharged from ED home via Bus.  

## 2018-08-05 NOTE — ED Notes (Signed)
Patient moved into treatment room for TTS reassessment

## 2018-08-05 NOTE — ED Notes (Signed)
Dr. Anitra Lauth made aware that patient needs reassessment by TTS and no order was placed by night time provider

## 2018-08-05 NOTE — ED Notes (Signed)
Pt transported to CT ?

## 2018-08-05 NOTE — ED Notes (Signed)
Lunch tray ordered 

## 2018-08-05 NOTE — ED Notes (Signed)
ED Provider at bedside. 

## 2018-08-05 NOTE — ED Notes (Signed)
ED Provider at bedside. Pt refusing to interact with EDP.

## 2018-08-05 NOTE — Progress Notes (Signed)
Per Reola Calkins NP, pt is psychiatrically cleared. CSW faxed outpatient resources to [phone].  Lonnell Chaput S. Alan Ripper, MSW, LCSW Clinical Social Worker 08/05/2018 1:35 PM

## 2018-08-06 ENCOUNTER — Emergency Department (HOSPITAL_COMMUNITY)
Admission: EM | Admit: 2018-08-06 | Discharge: 2018-08-06 | Disposition: A | Discharge status: Home / Self Care | Payer: Medicare HMO | Attending: Emergency Medicine | Admitting: Emergency Medicine

## 2018-08-06 ENCOUNTER — Emergency Department (HOSPITAL_COMMUNITY): Payer: Medicare HMO

## 2018-08-06 ENCOUNTER — Other Ambulatory Visit: Payer: Self-pay

## 2018-08-06 DIAGNOSIS — Z79899 Other long term (current) drug therapy: Secondary | ICD-10-CM | POA: Diagnosis not present

## 2018-08-06 DIAGNOSIS — F17210 Nicotine dependence, cigarettes, uncomplicated: Secondary | ICD-10-CM | POA: Diagnosis not present

## 2018-08-06 DIAGNOSIS — F1410 Cocaine abuse, uncomplicated: Secondary | ICD-10-CM | POA: Diagnosis not present

## 2018-08-06 DIAGNOSIS — I10 Essential (primary) hypertension: Secondary | ICD-10-CM | POA: Diagnosis not present

## 2018-08-06 DIAGNOSIS — Y998 Other external cause status: Secondary | ICD-10-CM | POA: Insufficient documentation

## 2018-08-06 DIAGNOSIS — Y9389 Activity, other specified: Secondary | ICD-10-CM | POA: Insufficient documentation

## 2018-08-06 DIAGNOSIS — R51 Headache: Secondary | ICD-10-CM | POA: Diagnosis not present

## 2018-08-06 DIAGNOSIS — B20 Human immunodeficiency virus [HIV] disease: Secondary | ICD-10-CM | POA: Diagnosis not present

## 2018-08-06 DIAGNOSIS — F1210 Cannabis abuse, uncomplicated: Secondary | ICD-10-CM | POA: Diagnosis not present

## 2018-08-06 DIAGNOSIS — Y929 Unspecified place or not applicable: Secondary | ICD-10-CM | POA: Insufficient documentation

## 2018-08-06 NOTE — ED Notes (Signed)
Rigid cervical collar applied.  

## 2018-08-06 NOTE — Discharge Instructions (Addendum)
Follow-up. [facility] for recheck within a week. We will call her on as much as possible,  take tylenol for pain
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
## 2018-08-06 NOTE — ED Notes (Signed)
Pt. Neck placed into [place] C-collar (rigid collar) per EDP order. Explained to pt. That he was to not remove collar. Pt. Understood.

## 2018-08-06 NOTE — ED Notes (Addendum)
Found pt. Standing in  Room, urinating on the floor, putting pee soaked pants back on, took all leads off, took off EMS placed C-collar, removed all stickers for leads, removed BP cuff, removed SPO2 cord and was badgering the staff about letting him leave and go home. I told him that if we need to get security and [employer] we will, pt understood and complied with requests at that time. Pt. Was moved into hallway at that time, due to ETOH, confusion and overall safety concerns.  Pt. Refusing to keep calm, refusing to wear C-collar, continuing to harass the staff in hallway. Explained again, that he was not leaving, until the MD was finished with pt. He understood.

## 2018-08-17 ENCOUNTER — Encounter: Payer: Medicare HMO | Admitting: Infectious Disease

## 2018-08-17 ENCOUNTER — Ambulatory Visit: Payer: Medicare HMO | Admitting: Licensed Clinical Social Worker

## 2018-08-23 ENCOUNTER — Emergency Department (HOSPITAL_COMMUNITY): Payer: Medicare HMO

## 2018-08-23 ENCOUNTER — Encounter (HOSPITAL_COMMUNITY): Payer: Self-pay | Admitting: Emergency Medicine

## 2018-08-23 ENCOUNTER — Emergency Department (HOSPITAL_COMMUNITY)
Admission: EM | Admit: 2018-08-23 | Discharge: 2018-08-23 | Disposition: A | Discharge status: Home / Self Care | Payer: Medicare HMO | Attending: Emergency Medicine | Admitting: Emergency Medicine

## 2018-08-23 DIAGNOSIS — F17210 Nicotine dependence, cigarettes, uncomplicated: Secondary | ICD-10-CM | POA: Diagnosis not present

## 2018-08-23 DIAGNOSIS — Y999 Unspecified external cause status: Secondary | ICD-10-CM | POA: Insufficient documentation

## 2018-08-23 DIAGNOSIS — J449 Chronic obstructive pulmonary disease, unspecified: Secondary | ICD-10-CM | POA: Insufficient documentation

## 2018-08-23 DIAGNOSIS — R059 Cough, unspecified: Secondary | ICD-10-CM

## 2018-08-23 DIAGNOSIS — Z79899 Other long term (current) drug therapy: Secondary | ICD-10-CM | POA: Insufficient documentation

## 2018-08-23 DIAGNOSIS — S0990XA Unspecified injury of head, initial encounter: Secondary | ICD-10-CM | POA: Insufficient documentation

## 2018-08-23 DIAGNOSIS — Y906 Blood alcohol level of 120-199 mg/100 ml: Secondary | ICD-10-CM | POA: Diagnosis not present

## 2018-08-23 DIAGNOSIS — Y929 Unspecified place or not applicable: Secondary | ICD-10-CM | POA: Diagnosis not present

## 2018-08-23 DIAGNOSIS — Y9301 Activity, walking, marching and hiking: Secondary | ICD-10-CM | POA: Diagnosis not present

## 2018-08-23 DIAGNOSIS — W2209XA Striking against other stationary object, initial encounter: Secondary | ICD-10-CM | POA: Diagnosis not present

## 2018-08-23 DIAGNOSIS — R05 Cough: Secondary | ICD-10-CM

## 2018-08-23 DIAGNOSIS — F10920 Alcohol use, unspecified with intoxication, uncomplicated: Secondary | ICD-10-CM | POA: Diagnosis present

## 2018-08-23 LAB — CBC WITH DIFFERENTIAL/PLATELET
Abs Immature Granulocytes: 0.02 10*3/uL (ref 0.00–0.07)
Basophils Absolute: 0 10*3/uL (ref 0.0–0.1)
Basophils Relative: 0 %
Eosinophils Absolute: 0.2 10*3/uL (ref 0.0–0.5)
Eosinophils Relative: 2 %
HCT: 41.2 % (ref 39.0–52.0)
Hemoglobin: 13.3 g/dL (ref 13.0–17.0)
Immature Granulocytes: 0 %
Lymphocytes Relative: 18 %
Lymphs Abs: 1.2 10*3/uL (ref 0.7–4.0)
MCH: 31.9 pg (ref 26.0–34.0)
MCHC: 32.3 g/dL (ref 30.0–36.0)
MCV: 98.8 fL (ref 80.0–100.0)
Monocytes Absolute: 0.6 10*3/uL (ref 0.1–1.0)
Monocytes Relative: 9 %
Neutro Abs: 4.4 10*3/uL (ref 1.7–7.7)
Neutrophils Relative %: 71 %
Platelets: 123 10*3/uL — ABNORMAL LOW (ref 150–400)
RBC: 4.17 MIL/uL — ABNORMAL LOW (ref 4.22–5.81)
RDW: 13.2 % (ref 11.5–15.5)
WBC: 6.2 10*3/uL (ref 4.0–10.5)
nRBC: 0 % (ref 0.0–0.2)

## 2018-08-23 LAB — COMPREHENSIVE METABOLIC PANEL
ALT: 38 U/L (ref 0–44)
AST: 82 U/L — ABNORMAL HIGH (ref 15–41)
Albumin: 2.9 g/dL — ABNORMAL LOW (ref 3.5–5.0)
Alkaline Phosphatase: 73 U/L (ref 38–126)
Anion gap: 7 (ref 5–15)
BUN: 10 mg/dL (ref 8–23)
CO2: 27 mmol/L (ref 22–32)
Calcium: 8.6 mg/dL — ABNORMAL LOW (ref 8.9–10.3)
Chloride: 99 mmol/L (ref 98–111)
Creatinine, Ser: 0.92 mg/dL (ref 0.61–1.24)
GFR calc Af Amer: >60 mL/min (ref >60)
GFR calc non Af Amer: >60 mL/min (ref >60)
Glucose, Bld: 87 mg/dL (ref 70–99)
Potassium: 3.4 mmol/L — ABNORMAL LOW (ref 3.5–5.1)
Sodium: 133 mmol/L — ABNORMAL LOW (ref 135–145)
Total Bilirubin: 1.1 mg/dL (ref 0.3–1.2)
Total Protein: 7.8 g/dL (ref 6.5–8.1)

## 2018-08-23 LAB — RAPID URINE DRUG SCREEN, HOSP PERFORMED
Amphetamines: NOT DETECTED
Barbiturates: NOT DETECTED
Benzodiazepines: NOT DETECTED
Cocaine: NOT DETECTED
Opiates: NOT DETECTED
Tetrahydrocannabinol: NOT DETECTED

## 2018-08-23 LAB — ETHANOL: Alcohol, Ethyl (B): 182 mg/dL — ABNORMAL HIGH (ref <10)

## 2018-08-23 LAB — ACETAMINOPHEN LEVEL: Acetaminophen (Tylenol), Serum: <10 ug/mL — ABNORMAL LOW (ref 10–30)

## 2018-08-23 LAB — SALICYLATE LEVEL: Salicylate Lvl: <7 mg/dL (ref 2.8–30.0)

## 2018-08-23 NOTE — ED Triage Notes (Signed)
Attempted to start IV and draw blood . Unable to obtain access site.

## 2018-08-23 NOTE — ED Notes (Signed)
Pt eating and drinking without complication, pt aware of plan to discharge

## 2018-08-23 NOTE — ED Notes (Signed)
Pt verbalizes understanding of discharge instructions and reports having an opportunity to have his questions answered 

## 2018-08-23 NOTE — ED Triage Notes (Signed)
PHlebotomy  At bed side .
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
## 2018-08-23 NOTE — Discharge Instructions (Addendum)
You were brought to the ED for alcohol intoxication.  Your lab work and imaging is normal.  Consider cutting back on alcohol use.  Stay hydrated.  Resume all your medicines.  Return for fever, productive cough, chest pain or shortness of breath, abdominal pain.

## 2018-08-23 NOTE — ED Notes (Signed)
Pt states, "I am going to ride the bus."
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
## 2018-08-27 ENCOUNTER — Encounter (HOSPITAL_COMMUNITY): Payer: Self-pay | Admitting: Family Medicine

## 2018-08-27 ENCOUNTER — Emergency Department (HOSPITAL_COMMUNITY)
Admission: EM | Admit: 2018-08-27 | Discharge: 2018-08-27 | Disposition: A | Discharge status: Home / Self Care | Payer: Medicare HMO | Attending: Emergency Medicine | Admitting: Emergency Medicine

## 2018-08-27 DIAGNOSIS — F10920 Alcohol use, unspecified with intoxication, uncomplicated: Secondary | ICD-10-CM | POA: Insufficient documentation

## 2018-08-27 DIAGNOSIS — J45909 Unspecified asthma, uncomplicated: Secondary | ICD-10-CM | POA: Insufficient documentation

## 2018-08-27 DIAGNOSIS — Z79899 Other long term (current) drug therapy: Secondary | ICD-10-CM | POA: Insufficient documentation

## 2018-08-27 DIAGNOSIS — B20 Human immunodeficiency virus [HIV] disease: Secondary | ICD-10-CM | POA: Diagnosis not present

## 2018-08-27 DIAGNOSIS — F17210 Nicotine dependence, cigarettes, uncomplicated: Secondary | ICD-10-CM | POA: Insufficient documentation

## 2018-08-27 DIAGNOSIS — M25569 Pain in unspecified knee: Secondary | ICD-10-CM | POA: Diagnosis present

## 2018-08-27 DIAGNOSIS — I10 Essential (primary) hypertension: Secondary | ICD-10-CM | POA: Diagnosis not present

## 2018-08-27 DIAGNOSIS — J449 Chronic obstructive pulmonary disease, unspecified: Secondary | ICD-10-CM | POA: Diagnosis not present

## 2018-08-27 MED ORDER — CHLORDIAZEPOXIDE HCL 25 MG PO CAPS: ORAL_CAPSULE | ORAL | 0 refills | Status: DC
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
## 2018-08-27 NOTE — ED Notes (Signed)
Bed: [facility] Expected date:  Expected time:  Means of arrival:  Comments: 

## 2018-08-27 NOTE — ED Triage Notes (Signed)
Patient was picked up in downtown [place] by [facility]. Patient reported to EMS he wants ETOH detox. He reports he has 3 [employer]'s today. He is complaining of knee pain.

## 2018-09-22 ENCOUNTER — Emergency Department (HOSPITAL_COMMUNITY)
Admission: EM | Admit: 2018-09-22 | Discharge: 2018-09-22 | Disposition: A | Discharge status: Home / Self Care | Payer: Medicare HMO | Attending: Emergency Medicine | Admitting: Emergency Medicine

## 2018-09-22 ENCOUNTER — Encounter (HOSPITAL_COMMUNITY): Payer: Self-pay

## 2018-09-22 ENCOUNTER — Other Ambulatory Visit: Payer: Self-pay

## 2018-09-22 DIAGNOSIS — B20 Human immunodeficiency virus [HIV] disease: Secondary | ICD-10-CM | POA: Diagnosis not present

## 2018-09-22 DIAGNOSIS — J449 Chronic obstructive pulmonary disease, unspecified: Secondary | ICD-10-CM | POA: Insufficient documentation

## 2018-09-22 DIAGNOSIS — F10920 Alcohol use, unspecified with intoxication, uncomplicated: Secondary | ICD-10-CM | POA: Diagnosis not present

## 2018-09-22 DIAGNOSIS — Z79899 Other long term (current) drug therapy: Secondary | ICD-10-CM | POA: Diagnosis not present

## 2018-09-22 DIAGNOSIS — I10 Essential (primary) hypertension: Secondary | ICD-10-CM | POA: Insufficient documentation

## 2018-09-22 DIAGNOSIS — F17210 Nicotine dependence, cigarettes, uncomplicated: Secondary | ICD-10-CM | POA: Diagnosis not present

## 2018-09-22 NOTE — ED Notes (Signed)
Pt alert and oriented x 3 with steady gait noted no respiratory or acute distress noted.

## 2018-09-22 NOTE — ED Notes (Signed)
Pt given sandwich and apple juice and got dressed and left with steady gait noted didn't want to wait on discharge instructions.
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
## 2018-09-22 NOTE — ED Notes (Signed)
Bed: WTR5 Expected date:  Expected time:  Means of arrival:  Comments: 

## 2018-09-30 ENCOUNTER — Encounter (HOSPITAL_COMMUNITY): Payer: Self-pay

## 2018-09-30 ENCOUNTER — Emergency Department (HOSPITAL_COMMUNITY)
Admission: EM | Admit: 2018-09-30 | Discharge: 2018-09-30 | Disposition: A | Discharge status: Home / Self Care | Payer: Medicare HMO | Attending: Emergency Medicine | Admitting: Emergency Medicine

## 2018-09-30 ENCOUNTER — Other Ambulatory Visit: Payer: Self-pay

## 2018-09-30 DIAGNOSIS — I10 Essential (primary) hypertension: Secondary | ICD-10-CM | POA: Insufficient documentation

## 2018-09-30 DIAGNOSIS — J45909 Unspecified asthma, uncomplicated: Secondary | ICD-10-CM | POA: Insufficient documentation

## 2018-09-30 DIAGNOSIS — J449 Chronic obstructive pulmonary disease, unspecified: Secondary | ICD-10-CM | POA: Diagnosis not present

## 2018-09-30 DIAGNOSIS — F10920 Alcohol use, unspecified with intoxication, uncomplicated: Secondary | ICD-10-CM

## 2018-09-30 DIAGNOSIS — Z21 Asymptomatic human immunodeficiency virus [HIV] infection status: Secondary | ICD-10-CM | POA: Diagnosis not present

## 2018-09-30 DIAGNOSIS — F17210 Nicotine dependence, cigarettes, uncomplicated: Secondary | ICD-10-CM | POA: Diagnosis not present

## 2018-09-30 DIAGNOSIS — F1010 Alcohol abuse, uncomplicated: Secondary | ICD-10-CM

## 2018-09-30 DIAGNOSIS — Z79899 Other long term (current) drug therapy: Secondary | ICD-10-CM | POA: Diagnosis not present

## 2018-09-30 DIAGNOSIS — F1910 Other psychoactive substance abuse, uncomplicated: Secondary | ICD-10-CM

## 2018-09-30 NOTE — ED Triage Notes (Signed)
When asked why he was brought to hospital his reply was: "People saw me and thought I was layin' there dead, so they called the ambulance and they came and got me". [sic]. He tells he he is "schizophrenic, bipolar and HIV and I need my Celexa, Gabapentin, Restoril and Zoloft and my HIV med right away 'cuz I'm out" [sic]. He is in no distress.
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
## 2018-09-30 NOTE — ED Notes (Signed)
Bed: WA15 Expected date:  Expected time:  Means of arrival:  Comments: ETOH 

## 2018-09-30 NOTE — Discharge Instructions (Addendum)
1.  Make an appointment to see your doctor as soon as possible this week for recheck. 2.  Contact your pharmacy Monday to make sure that you are medications are available and pick them up tomorrow.

## 2018-09-30 NOTE — ED Notes (Signed)
He is eating a [place] sandwich and drinking ginger ale without difficulty.

## 2018-10-12 ENCOUNTER — Other Ambulatory Visit: Payer: Self-pay

## 2018-10-12 ENCOUNTER — Emergency Department (HOSPITAL_COMMUNITY)
Admission: EM | Admit: 2018-10-12 | Discharge: 2018-10-12 | Disposition: A | Discharge status: Home / Self Care | Payer: Medicare HMO | Attending: Emergency Medicine | Admitting: Emergency Medicine

## 2018-10-12 ENCOUNTER — Emergency Department (HOSPITAL_COMMUNITY): Payer: Medicare HMO

## 2018-10-12 DIAGNOSIS — F17210 Nicotine dependence, cigarettes, uncomplicated: Secondary | ICD-10-CM | POA: Insufficient documentation

## 2018-10-12 DIAGNOSIS — R0789 Other chest pain: Secondary | ICD-10-CM | POA: Insufficient documentation

## 2018-10-12 DIAGNOSIS — Z21 Asymptomatic human immunodeficiency virus [HIV] infection status: Secondary | ICD-10-CM | POA: Diagnosis not present

## 2018-10-12 DIAGNOSIS — J449 Chronic obstructive pulmonary disease, unspecified: Secondary | ICD-10-CM | POA: Diagnosis not present

## 2018-10-12 DIAGNOSIS — Z79899 Other long term (current) drug therapy: Secondary | ICD-10-CM | POA: Insufficient documentation

## 2018-10-12 LAB — COMPREHENSIVE METABOLIC PANEL
ALT: 50 U/L — ABNORMAL HIGH (ref 0–44)
AST: 68 U/L — ABNORMAL HIGH (ref 15–41)
Albumin: 3.3 g/dL — ABNORMAL LOW (ref 3.5–5.0)
Alkaline Phosphatase: 66 U/L (ref 38–126)
Anion gap: 12 (ref 5–15)
BUN: 7 mg/dL — ABNORMAL LOW (ref 8–23)
CO2: 23 mmol/L (ref 22–32)
Calcium: 9.5 mg/dL (ref 8.9–10.3)
Chloride: 101 mmol/L (ref 98–111)
Creatinine, Ser: 1.24 mg/dL (ref 0.61–1.24)
GFR calc Af Amer: >60 mL/min (ref >60)
GFR calc non Af Amer: >60 mL/min (ref >60)
Glucose, Bld: 96 mg/dL (ref 70–99)
Potassium: 3.7 mmol/L (ref 3.5–5.1)
Sodium: 136 mmol/L (ref 135–145)
Total Bilirubin: 1 mg/dL (ref 0.3–1.2)
Total Protein: 8.6 g/dL — ABNORMAL HIGH (ref 6.5–8.1)

## 2018-10-12 LAB — CBC WITH DIFFERENTIAL/PLATELET
Abs Immature Granulocytes: 0.03 10*3/uL (ref 0.00–0.07)
Basophils Absolute: 0 10*3/uL (ref 0.0–0.1)
Basophils Relative: 1 %
Eosinophils Absolute: 0.1 10*3/uL (ref 0.0–0.5)
Eosinophils Relative: 3 %
HCT: 46.5 % (ref 39.0–52.0)
Hemoglobin: 15.7 g/dL (ref 13.0–17.0)
Immature Granulocytes: 1 %
Lymphocytes Relative: 20 %
Lymphs Abs: 1.1 10*3/uL (ref 0.7–4.0)
MCH: 32.8 pg (ref 26.0–34.0)
MCHC: 33.8 g/dL (ref 30.0–36.0)
MCV: 97.3 fL (ref 80.0–100.0)
Monocytes Absolute: 0.6 10*3/uL (ref 0.1–1.0)
Monocytes Relative: 11 %
Neutro Abs: 3.6 10*3/uL (ref 1.7–7.7)
Neutrophils Relative %: 64 %
Platelets: 131 10*3/uL — ABNORMAL LOW (ref 150–400)
RBC: 4.78 MIL/uL (ref 4.22–5.81)
RDW: 12.8 % (ref 11.5–15.5)
WBC: 5.5 10*3/uL (ref 4.0–10.5)
nRBC: 0 % (ref 0.0–0.2)

## 2018-10-12 LAB — ETHANOL: Alcohol, Ethyl (B): <10 mg/dL (ref <10)

## 2018-10-12 LAB — TROPONIN I: Troponin I: <0.03 ng/mL (ref <0.03)

## 2018-10-12 MED ORDER — GABAPENTIN 100 MG PO CAPS
100.0000 mg | ORAL_CAPSULE | Freq: Three times a day (TID) | ORAL | Status: DC
  Administered 2018-10-12: 100 mg via ORAL
  Filled 2018-10-12: qty 1

## 2018-10-12 MED ORDER — RISPERIDONE 3 MG PO TABS: 3.0000 mg | ORAL_TABLET | Freq: Every day | ORAL | Status: DC

## 2018-10-12 MED ORDER — ABACAVIR-DOLUTEGRAVIR-LAMIVUD 600-50-300 MG PO TABS
1.0000 | ORAL_TABLET | Freq: Every day | ORAL | Status: DC
  Filled 2018-10-12: qty 1

## 2018-10-12 MED ORDER — SERTRALINE HCL 100 MG PO TABS
100.0000 mg | ORAL_TABLET | Freq: Every day | ORAL | Status: DC
  Administered 2018-10-12: 100 mg via ORAL
  Filled 2018-10-12: qty 1

## 2018-10-12 MED ORDER — TRAZODONE HCL 50 MG PO TABS: 50.0000 mg | ORAL_TABLET | Freq: Every evening | ORAL | Status: DC | PRN
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
## 2018-10-12 NOTE — ED Notes (Signed)
ED Provider at bedside. 

## 2018-10-12 NOTE — Discharge Planning (Signed)
[facility] reviewing for multiple admissions.  Notes pt "can not afford Rx." Pt has insurance with Rx coverage with $3.00 co-pay for most Rx.

## 2018-10-12 NOTE — ED Notes (Signed)
Per [facility], unable to contact family by any phone number given.  Patient has no new numbers to offer.

## 2018-10-12 NOTE — ED Triage Notes (Signed)
Pt in with c/o chest pain and sob x the past week. Denies any n/v, cough or fevers.

## 2018-10-16 ENCOUNTER — Emergency Department (HOSPITAL_COMMUNITY)
Admission: EM | Admit: 2018-10-16 | Discharge: 2018-10-16 | Disposition: A | Discharge status: Home / Self Care | Payer: Medicare HMO | Attending: Emergency Medicine | Admitting: Emergency Medicine

## 2018-10-16 DIAGNOSIS — J45909 Unspecified asthma, uncomplicated: Secondary | ICD-10-CM | POA: Insufficient documentation

## 2018-10-16 DIAGNOSIS — Z79899 Other long term (current) drug therapy: Secondary | ICD-10-CM | POA: Diagnosis not present

## 2018-10-16 DIAGNOSIS — R079 Chest pain, unspecified: Secondary | ICD-10-CM | POA: Diagnosis present

## 2018-10-16 DIAGNOSIS — B20 Human immunodeficiency virus [HIV] disease: Secondary | ICD-10-CM | POA: Insufficient documentation

## 2018-10-16 DIAGNOSIS — F10920 Alcohol use, unspecified with intoxication, uncomplicated: Secondary | ICD-10-CM | POA: Diagnosis not present

## 2018-10-16 DIAGNOSIS — F17210 Nicotine dependence, cigarettes, uncomplicated: Secondary | ICD-10-CM | POA: Diagnosis not present

## 2018-10-16 DIAGNOSIS — J449 Chronic obstructive pulmonary disease, unspecified: Secondary | ICD-10-CM | POA: Insufficient documentation

## 2018-10-16 NOTE — ED Notes (Signed)
EMS unable to get vitals due to pt being aggressive and refusing to sit still

## 2018-10-16 NOTE — ED Notes (Signed)
Pt sitting in hallway refusing to go into room. [employer] and security present

## 2018-10-16 NOTE — ED Notes (Signed)
Patient verbalizes understanding of discharge instructions. Opportunity for questioning and answers were provided. Armband removed by staff, pt discharged from ED ambulatory with [employer] and security

## 2018-10-16 NOTE — ED Triage Notes (Signed)
Pt coming after [employer] called when pt called for chest pain. Pt refusing to answer questions about chest pain. Pt stating "I want a psych ward and [place] sandwich." Pt being aggressive during triage and yelling in hallway.
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
## 2018-10-31 ENCOUNTER — Encounter: Payer: Self-pay | Admitting: Infectious Disease

## 2018-10-31 ENCOUNTER — Ambulatory Visit (INDEPENDENT_AMBULATORY_CARE_PROVIDER_SITE_OTHER): Payer: Medicare HMO | Admitting: Infectious Disease

## 2018-10-31 ENCOUNTER — Encounter (HOSPITAL_COMMUNITY): Payer: Self-pay

## 2018-10-31 ENCOUNTER — Other Ambulatory Visit: Payer: Self-pay

## 2018-10-31 ENCOUNTER — Telehealth: Payer: Self-pay

## 2018-10-31 ENCOUNTER — Emergency Department (HOSPITAL_COMMUNITY)
Admission: EM | Admit: 2018-10-31 | Discharge: 2018-10-31 | Disposition: A | Discharge status: Home / Self Care | Payer: Medicare HMO | Attending: Emergency Medicine | Admitting: Emergency Medicine

## 2018-10-31 ENCOUNTER — Emergency Department (HOSPITAL_COMMUNITY): Payer: Medicare HMO

## 2018-10-31 VITALS — BP 134/90 | HR 90 | Temp 97.7°F | Wt 229.0 lb

## 2018-10-31 DIAGNOSIS — B182 Chronic viral hepatitis C: Secondary | ICD-10-CM | POA: Diagnosis not present

## 2018-10-31 DIAGNOSIS — Z5321 Procedure and treatment not carried out due to patient leaving prior to being seen by health care provider: Secondary | ICD-10-CM | POA: Insufficient documentation

## 2018-10-31 DIAGNOSIS — F250 Schizoaffective disorder, bipolar type: Secondary | ICD-10-CM

## 2018-10-31 DIAGNOSIS — Z79899 Other long term (current) drug therapy: Secondary | ICD-10-CM

## 2018-10-31 DIAGNOSIS — N529 Male erectile dysfunction, unspecified: Secondary | ICD-10-CM

## 2018-10-31 DIAGNOSIS — Z5900 Homelessness unspecified: Secondary | ICD-10-CM

## 2018-10-31 DIAGNOSIS — F1014 Alcohol abuse with alcohol-induced mood disorder: Secondary | ICD-10-CM | POA: Diagnosis not present

## 2018-10-31 DIAGNOSIS — B20 Human immunodeficiency virus [HIV] disease: Secondary | ICD-10-CM

## 2018-10-31 DIAGNOSIS — S81811A Laceration without foreign body, right lower leg, initial encounter: Secondary | ICD-10-CM

## 2018-10-31 DIAGNOSIS — R0789 Other chest pain: Secondary | ICD-10-CM | POA: Diagnosis present

## 2018-10-31 LAB — COMPREHENSIVE METABOLIC PANEL
ALT: 47 U/L — ABNORMAL HIGH (ref 0–44)
AST: 89 U/L — ABNORMAL HIGH (ref 15–41)
Albumin: 3.1 g/dL — ABNORMAL LOW (ref 3.5–5.0)
Alkaline Phosphatase: 62 U/L (ref 38–126)
Anion gap: 12 (ref 5–15)
BUN: 13 mg/dL (ref 8–23)
CO2: 19 mmol/L — ABNORMAL LOW (ref 22–32)
Calcium: 9 mg/dL (ref 8.9–10.3)
Chloride: 107 mmol/L (ref 98–111)
Creatinine, Ser: 1.38 mg/dL — ABNORMAL HIGH (ref 0.61–1.24)
GFR calc Af Amer: >60 mL/min (ref >60)
GFR calc non Af Amer: 53 mL/min — ABNORMAL LOW (ref >60)
Glucose, Bld: 92 mg/dL (ref 70–99)
Potassium: 3.4 mmol/L — ABNORMAL LOW (ref 3.5–5.1)
Sodium: 138 mmol/L (ref 135–145)
Total Bilirubin: 0.9 mg/dL (ref 0.3–1.2)
Total Protein: 8 g/dL (ref 6.5–8.1)

## 2018-10-31 LAB — CBC
HCT: 44.4 % (ref 39.0–52.0)
Hemoglobin: 14.4 g/dL (ref 13.0–17.0)
MCH: 32.6 pg (ref 26.0–34.0)
MCHC: 32.4 g/dL (ref 30.0–36.0)
MCV: 100.5 fL — ABNORMAL HIGH (ref 80.0–100.0)
Platelets: 134 10*3/uL — ABNORMAL LOW (ref 150–400)
RBC: 4.42 MIL/uL (ref 4.22–5.81)
RDW: 13.2 % (ref 11.5–15.5)
WBC: 4.9 10*3/uL (ref 4.0–10.5)
nRBC: 0.4 % — ABNORMAL HIGH (ref 0.0–0.2)

## 2018-10-31 LAB — TROPONIN I: Troponin I: 0.03 ng/mL (ref <0.03)

## 2018-10-31 LAB — ETHANOL: Alcohol, Ethyl (B): 92 mg/dL — ABNORMAL HIGH (ref <10)

## 2018-10-31 LAB — SALICYLATE LEVEL: Salicylate Lvl: <7 mg/dL (ref 2.8–30.0)

## 2018-10-31 LAB — ACETAMINOPHEN LEVEL: Acetaminophen (Tylenol), Serum: <10 ug/mL — ABNORMAL LOW (ref 10–30)

## 2018-10-31 MED ORDER — SULFAMETHOXAZOLE-TRIMETHOPRIM 800-160 MG PO TABS: 1.0000 | ORAL_TABLET | Freq: Two times a day (BID) | ORAL | 11 refills | Status: DC

## 2018-10-31 MED ORDER — GABAPENTIN 100 MG PO CAPS: 100.0000 mg | ORAL_CAPSULE | Freq: Three times a day (TID) | ORAL | 0 refills | Status: DC

## 2018-10-31 MED ORDER — RISPERIDONE 3 MG PO TABS: 3.0000 mg | ORAL_TABLET | Freq: Every day | ORAL | 11 refills | Status: DC

## 2018-10-31 MED ORDER — SERTRALINE HCL 100 MG PO TABS: 100.0000 mg | ORAL_TABLET | Freq: Every day | ORAL | 11 refills | Status: DC

## 2018-10-31 MED ORDER — BICTEGRAVIR-EMTRICITAB-TENOFOV 50-200-25 MG PO TABS: 1.0000 | ORAL_TABLET | Freq: Every day | ORAL | 11 refills | Status: DC

## 2018-10-31 NOTE — ED Notes (Signed)
Patient called by staff for vitals reassesment, no answer.

## 2018-10-31 NOTE — Addendum Note (Signed)
Addended by: Dolan Amen D on: 10/31/2018 10:56 AM   Modules accepted: Orders
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
## 2018-10-31 NOTE — ED Notes (Signed)
Called pt 1 final time with no answer.

## 2018-10-31 NOTE — ED Triage Notes (Signed)
Pt bib ems for alcohol intoxication, pt found laying in bushes by a bystander. Pt arrive to ED alert, nad noted.

## 2018-10-31 NOTE — ED Notes (Signed)
Pt called for vital signs x3 with no answer. °

## 2018-10-31 NOTE — Telephone Encounter (Signed)
Pharmacy called today to confirm new prescription for Biktarvy. Christy Sartorius, Pharmacist would like to know if he could cancel prescription for Triumeq, and start prescription for Biktarvy. Verbalized to pharmacist that [name] should be filled.  [place]

## 2018-10-31 NOTE — ED Notes (Signed)
Patient called by staff, with no answer.

## 2018-11-01 ENCOUNTER — Other Ambulatory Visit: Payer: Self-pay | Admitting: Infectious Disease

## 2018-11-01 ENCOUNTER — Telehealth: Payer: Self-pay | Admitting: Pharmacy Technician

## 2018-11-01 LAB — T-HELPER CELL (CD4) - (RCID CLINIC ONLY)
CD4 % Helper T Cell: 14 % — ABNORMAL LOW (ref 33–65)
CD4 T Cell Abs: 117 /uL — ABNORMAL LOW (ref 400–1790)

## 2018-11-01 NOTE — Telephone Encounter (Signed)
[facility] Patient Advocate Encounter  [name] from [employer] called with [name] in her office concerned because he lost his medications between the pharmacy and being admitted last night to the hospital Emergency Department. [facility] is unable to provide any additional medications since he picked up on 10/31/2018 and [employer] is unable to provide an emergency fill or override for the lost medications.   [name]'s number is [phone] for more information. Patient is aware of this information.

## 2018-11-05 ENCOUNTER — Emergency Department (HOSPITAL_COMMUNITY)
Admission: EM | Admit: 2018-11-05 | Discharge: 2018-11-06 | Disposition: A | Discharge status: Home / Self Care | Payer: Medicare HMO | Attending: Emergency Medicine | Admitting: Emergency Medicine

## 2018-11-05 ENCOUNTER — Encounter (HOSPITAL_COMMUNITY): Payer: Self-pay | Admitting: Family Medicine

## 2018-11-05 DIAGNOSIS — Z79899 Other long term (current) drug therapy: Secondary | ICD-10-CM | POA: Diagnosis not present

## 2018-11-05 DIAGNOSIS — I10 Essential (primary) hypertension: Secondary | ICD-10-CM | POA: Insufficient documentation

## 2018-11-05 DIAGNOSIS — Z21 Asymptomatic human immunodeficiency virus [HIV] infection status: Secondary | ICD-10-CM | POA: Diagnosis not present

## 2018-11-05 DIAGNOSIS — J45909 Unspecified asthma, uncomplicated: Secondary | ICD-10-CM | POA: Diagnosis not present

## 2018-11-05 DIAGNOSIS — F1910 Other psychoactive substance abuse, uncomplicated: Secondary | ICD-10-CM | POA: Insufficient documentation

## 2018-11-05 DIAGNOSIS — J449 Chronic obstructive pulmonary disease, unspecified: Secondary | ICD-10-CM | POA: Insufficient documentation

## 2018-11-05 DIAGNOSIS — F17210 Nicotine dependence, cigarettes, uncomplicated: Secondary | ICD-10-CM | POA: Diagnosis not present

## 2018-11-05 DIAGNOSIS — R1084 Generalized abdominal pain: Secondary | ICD-10-CM | POA: Diagnosis present

## 2018-11-05 LAB — CBG MONITORING, ED: Glucose-Capillary: 79 mg/dL (ref 70–99)
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
## 2018-11-05 NOTE — ED Notes (Signed)
Bed: WTR5 Expected date:  Expected time:  Means of arrival:  Comments: 

## 2018-11-05 NOTE — ED Triage Notes (Signed)
Patient was transported from a fire station by EMS. Patient is alcohol intoxicated and stated to EMS he took an unknown substance. He would not give any further information. Patient reported to EMS he was suicidal but made no remark about being suicidal or homicidal. Denies being suicidal or homicidal.

## 2018-11-06 NOTE — ED Notes (Signed)
Patient was sleeping and allowing him to sober up. Patient wok up, provided a sandwich, and started to become belligerent. Patient refused to take discharge instructions from staff and take vital signs. Ripped all cardiac leads from his chest. Notified security and patient was escorted off property by security. Security attempted to provided discharge instructions.

## 2018-11-09 ENCOUNTER — Other Ambulatory Visit: Payer: Self-pay | Admitting: *Deleted

## 2018-11-09 DIAGNOSIS — Z20822 Contact with and (suspected) exposure to covid-19: Secondary | ICD-10-CM

## 2018-11-10 LAB — CBC WITH DIFFERENTIAL/PLATELET
Absolute Monocytes: 486 cells/uL (ref 200–950)
Basophils Absolute: 32 cells/uL (ref 0–200)
Basophils Relative: 0.6 %
Eosinophils Absolute: 184 cells/uL (ref 15–500)
Eosinophils Relative: 3.4 %
HCT: 41.9 % (ref 38.5–50.0)
Hemoglobin: 14.3 g/dL (ref 13.2–17.1)
Lymphs Abs: 1048 cells/uL (ref 850–3900)
MCH: 32.4 pg (ref 27.0–33.0)
MCHC: 34.1 g/dL (ref 32.0–36.0)
MCV: 95 fL (ref 80.0–100.0)
MPV: 10 fL (ref 7.5–12.5)
Monocytes Relative: 9 %
Neutro Abs: 3650 cells/uL (ref 1500–7800)
Neutrophils Relative %: 67.6 %
Platelets: 114 10*3/uL — ABNORMAL LOW (ref 140–400)
RBC: 4.41 10*6/uL (ref 4.20–5.80)
RDW: 11.9 % (ref 11.0–15.0)
Total Lymphocyte: 19.4 %
WBC: 5.4 10*3/uL (ref 3.8–10.8)

## 2018-11-10 LAB — COMPLETE METABOLIC PANEL WITH GFR
AG Ratio: 0.7 (calc) — ABNORMAL LOW (ref 1.0–2.5)
ALT: 41 U/L (ref 9–46)
AST: 70 U/L — ABNORMAL HIGH (ref 10–35)
Albumin: 3.3 g/dL — ABNORMAL LOW (ref 3.6–5.1)
Alkaline phosphatase (APISO): 59 U/L (ref 35–144)
BUN: 11 mg/dL (ref 7–25)
CO2: 27 mmol/L (ref 20–32)
Calcium: 8.9 mg/dL (ref 8.6–10.3)
Chloride: 105 mmol/L (ref 98–110)
Creat: 0.89 mg/dL (ref 0.70–1.25)
GFR, Est African American: 104 mL/min/{1.73_m2} (ref >=60)
GFR, Est Non African American: 90 mL/min/{1.73_m2} (ref >=60)
Globulin: 4.5 g/dL (calc) — ABNORMAL HIGH (ref 1.9–3.7)
Glucose, Bld: 89 mg/dL (ref 65–99)
Potassium: 3.6 mmol/L (ref 3.5–5.3)
Sodium: 138 mmol/L (ref 135–146)
Total Bilirubin: 1.2 mg/dL (ref 0.2–1.2)
Total Protein: 7.8 g/dL (ref 6.1–8.1)

## 2018-11-10 LAB — LIPID PANEL
Cholesterol: 92 mg/dL (ref <200)
HDL: 30 mg/dL — ABNORMAL LOW (ref >=40)
LDL Cholesterol (Calc): 49 mg/dL (calc)
Non-HDL Cholesterol (Calc): 62 mg/dL (calc) (ref <130)
Total CHOL/HDL Ratio: 3.1 (calc) (ref <5.0)
Triglycerides: 57 mg/dL (ref <150)

## 2018-11-10 LAB — RPR: RPR Ser Ql: NONREACTIVE

## 2018-11-10 LAB — HIV-1 RNA QUANT-NO REFLEX-BLD
HIV 1 RNA Quant: <20 copies/mL — AB
HIV-1 RNA Quant, Log: <1.3 Log copies/mL — AB

## 2018-11-11 LAB — NOVEL CORONAVIRUS, NAA: SARS-CoV-2, NAA: NOT DETECTED

## 2018-11-16 ENCOUNTER — Emergency Department (HOSPITAL_COMMUNITY)
Admission: EM | Admit: 2018-11-16 | Discharge: 2018-11-16 | Disposition: A | Discharge status: Home / Self Care | Payer: Medicare HMO | Attending: Emergency Medicine | Admitting: Emergency Medicine

## 2018-11-16 ENCOUNTER — Emergency Department (HOSPITAL_COMMUNITY): Payer: Medicare HMO

## 2018-11-16 ENCOUNTER — Encounter (HOSPITAL_COMMUNITY): Payer: Self-pay | Admitting: Emergency Medicine

## 2018-11-16 ENCOUNTER — Other Ambulatory Visit: Payer: Self-pay

## 2018-11-16 DIAGNOSIS — F17210 Nicotine dependence, cigarettes, uncomplicated: Secondary | ICD-10-CM | POA: Diagnosis not present

## 2018-11-16 DIAGNOSIS — Z590 Homelessness: Secondary | ICD-10-CM | POA: Diagnosis not present

## 2018-11-16 DIAGNOSIS — K625 Hemorrhage of anus and rectum: Secondary | ICD-10-CM | POA: Diagnosis not present

## 2018-11-16 DIAGNOSIS — R1031 Right lower quadrant pain: Secondary | ICD-10-CM | POA: Diagnosis not present

## 2018-11-16 DIAGNOSIS — J449 Chronic obstructive pulmonary disease, unspecified: Secondary | ICD-10-CM | POA: Insufficient documentation

## 2018-11-16 DIAGNOSIS — Z79899 Other long term (current) drug therapy: Secondary | ICD-10-CM | POA: Insufficient documentation

## 2018-11-16 LAB — CBC
HCT: 43.6 % (ref 39.0–52.0)
Hemoglobin: 14.1 g/dL (ref 13.0–17.0)
MCH: 32 pg (ref 26.0–34.0)
MCHC: 32.3 g/dL (ref 30.0–36.0)
MCV: 98.9 fL (ref 80.0–100.0)
Platelets: 146 10*3/uL — ABNORMAL LOW (ref 150–400)
RBC: 4.41 MIL/uL (ref 4.22–5.81)
RDW: 12.7 % (ref 11.5–15.5)
WBC: 4.8 10*3/uL (ref 4.0–10.5)
nRBC: 0 % (ref 0.0–0.2)

## 2018-11-16 LAB — COMPREHENSIVE METABOLIC PANEL
ALT: 45 U/L — ABNORMAL HIGH (ref 0–44)
AST: 79 U/L — ABNORMAL HIGH (ref 15–41)
Albumin: 3 g/dL — ABNORMAL LOW (ref 3.5–5.0)
Alkaline Phosphatase: 70 U/L (ref 38–126)
Anion gap: 7 (ref 5–15)
BUN: 9 mg/dL (ref 8–23)
CO2: 22 mmol/L (ref 22–32)
Calcium: 9 mg/dL (ref 8.9–10.3)
Chloride: 109 mmol/L (ref 98–111)
Creatinine, Ser: 0.9 mg/dL (ref 0.61–1.24)
GFR calc Af Amer: >60 mL/min (ref >60)
GFR calc non Af Amer: >60 mL/min (ref >60)
Glucose, Bld: 96 mg/dL (ref 70–99)
Potassium: 4.2 mmol/L (ref 3.5–5.1)
Sodium: 138 mmol/L (ref 135–145)
Total Bilirubin: 1 mg/dL (ref 0.3–1.2)
Total Protein: 8.2 g/dL — ABNORMAL HIGH (ref 6.5–8.1)

## 2018-11-16 LAB — TYPE AND SCREEN
ABO/RH(D): A POS
Antibody Screen: NEGATIVE

## 2018-11-16 LAB — URINALYSIS, ROUTINE W REFLEX MICROSCOPIC
Bilirubin Urine: NEGATIVE
Glucose, UA: NEGATIVE mg/dL
Hgb urine dipstick: NEGATIVE
Ketones, ur: NEGATIVE mg/dL
Leukocytes,Ua: NEGATIVE
Nitrite: NEGATIVE
Protein, ur: NEGATIVE mg/dL
Specific Gravity, Urine: 1.016 (ref 1.005–1.030)
pH: 6 (ref 5.0–8.0)

## 2018-11-16 LAB — POC OCCULT BLOOD, ED: Fecal Occult Bld: NEGATIVE

## 2018-11-16 MED ORDER — IOHEXOL 300 MG/ML  SOLN
100.0000 mL | Freq: Once | INTRAMUSCULAR | Status: AC | PRN
  Administered 2018-11-16: 100 mL via INTRAVENOUS

## 2018-11-16 NOTE — ED Notes (Signed)
Patient transported to CT 

## 2018-11-16 NOTE — ED Notes (Signed)
Patient verbalizes understanding of discharge instructions. Opportunity for questioning and answers were provided. Armband removed by staff, pt discharged from ED.  

## 2018-11-16 NOTE — ED Triage Notes (Signed)
Patient reports RLQ pain x 1 week as well as bright red, loose stools - last one this morning. Denies history of same. Also endorses lightheadedness with position changes. Denies fevers, N/V.
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
## 2018-11-17 LAB — URINE CULTURE: Culture: NO GROWTH

## 2018-11-23 ENCOUNTER — Emergency Department (HOSPITAL_COMMUNITY)
Admission: EM | Admit: 2018-11-23 | Discharge: 2018-11-23 | Disposition: A | Discharge status: Home / Self Care | Payer: Medicare HMO | Attending: Emergency Medicine | Admitting: Emergency Medicine

## 2018-11-23 ENCOUNTER — Other Ambulatory Visit: Payer: Self-pay

## 2018-11-23 ENCOUNTER — Encounter (HOSPITAL_COMMUNITY): Payer: Self-pay | Admitting: Emergency Medicine

## 2018-11-23 ENCOUNTER — Emergency Department (HOSPITAL_COMMUNITY): Payer: Medicare HMO

## 2018-11-23 DIAGNOSIS — I10 Essential (primary) hypertension: Secondary | ICD-10-CM | POA: Diagnosis not present

## 2018-11-23 DIAGNOSIS — R0789 Other chest pain: Secondary | ICD-10-CM | POA: Insufficient documentation

## 2018-11-23 DIAGNOSIS — J449 Chronic obstructive pulmonary disease, unspecified: Secondary | ICD-10-CM | POA: Insufficient documentation

## 2018-11-23 DIAGNOSIS — Z79899 Other long term (current) drug therapy: Secondary | ICD-10-CM | POA: Insufficient documentation

## 2018-11-23 DIAGNOSIS — R079 Chest pain, unspecified: Secondary | ICD-10-CM | POA: Diagnosis present

## 2018-11-23 DIAGNOSIS — F17210 Nicotine dependence, cigarettes, uncomplicated: Secondary | ICD-10-CM | POA: Insufficient documentation

## 2018-11-23 DIAGNOSIS — B20 Human immunodeficiency virus [HIV] disease: Secondary | ICD-10-CM | POA: Diagnosis not present

## 2018-11-23 LAB — CBC
HCT: 41.5 % (ref 39.0–52.0)
Hemoglobin: 13.7 g/dL (ref 13.0–17.0)
MCH: 32.5 pg (ref 26.0–34.0)
MCHC: 33 g/dL (ref 30.0–36.0)
MCV: 98.3 fL (ref 80.0–100.0)
Platelets: 111 10*3/uL — ABNORMAL LOW (ref 150–400)
RBC: 4.22 MIL/uL (ref 4.22–5.81)
RDW: 12.4 % (ref 11.5–15.5)
WBC: 4.4 10*3/uL (ref 4.0–10.5)
nRBC: 0 % (ref 0.0–0.2)

## 2018-11-23 LAB — BASIC METABOLIC PANEL
Anion gap: 8 (ref 5–15)
BUN: 8 mg/dL (ref 8–23)
CO2: 22 mmol/L (ref 22–32)
Calcium: 9.2 mg/dL (ref 8.9–10.3)
Chloride: 109 mmol/L (ref 98–111)
Creatinine, Ser: 0.88 mg/dL (ref 0.61–1.24)
GFR calc Af Amer: >60 mL/min (ref >60)
GFR calc non Af Amer: >60 mL/min (ref >60)
Glucose, Bld: 174 mg/dL — ABNORMAL HIGH (ref 70–99)
Potassium: 3.7 mmol/L (ref 3.5–5.1)
Sodium: 139 mmol/L (ref 135–145)

## 2018-11-23 LAB — TROPONIN I (HIGH SENSITIVITY)
Troponin I (High Sensitivity): 12 ng/L (ref <18)
Troponin I (High Sensitivity): 12 ng/L (ref <18)

## 2018-11-23 MED ORDER — ALBUTEROL SULFATE HFA 108 (90 BASE) MCG/ACT IN AERS: 8.0000 | INHALATION_SPRAY | Freq: Once | RESPIRATORY_TRACT | Status: DC

## 2018-11-23 MED ORDER — DEXAMETHASONE SODIUM PHOSPHATE 10 MG/ML IJ SOLN: 10.0000 mg | Freq: Once | INTRAMUSCULAR | Status: DC

## 2018-11-23 MED ORDER — SODIUM CHLORIDE 0.9% FLUSH: 3.0000 mL | Freq: Once | INTRAVENOUS | Status: DC
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
## 2018-11-23 NOTE — ED Notes (Signed)
Patient Alert and oriented to baseline. Stable and ambulatory to baseline. Patient verbalized understanding of the discharge instructions.  Patient belongings were taken by the patient.   

## 2018-11-28 ENCOUNTER — Other Ambulatory Visit: Payer: Self-pay | Admitting: Infectious Disease

## 2018-11-29 ENCOUNTER — Other Ambulatory Visit: Payer: Self-pay | Admitting: Infectious Disease

## 2018-12-11 ENCOUNTER — Ambulatory Visit: Payer: Medicare HMO | Admitting: Infectious Disease

## 2018-12-13 ENCOUNTER — Other Ambulatory Visit: Payer: Self-pay

## 2018-12-13 ENCOUNTER — Encounter: Payer: Self-pay | Admitting: Infectious Disease

## 2018-12-13 ENCOUNTER — Ambulatory Visit (INDEPENDENT_AMBULATORY_CARE_PROVIDER_SITE_OTHER): Payer: Medicare HMO | Admitting: Infectious Disease

## 2018-12-13 VITALS — BP 139/97 | HR 3 | Temp 98.2°F | Ht 72.0 in | Wt 235.0 lb

## 2018-12-13 DIAGNOSIS — B20 Human immunodeficiency virus [HIV] disease: Secondary | ICD-10-CM

## 2018-12-13 DIAGNOSIS — F1420 Cocaine dependence, uncomplicated: Secondary | ICD-10-CM

## 2018-12-13 DIAGNOSIS — B182 Chronic viral hepatitis C: Secondary | ICD-10-CM | POA: Diagnosis not present

## 2018-12-13 DIAGNOSIS — F1410 Cocaine abuse, uncomplicated: Secondary | ICD-10-CM

## 2018-12-13 DIAGNOSIS — F250 Schizoaffective disorder, bipolar type: Secondary | ICD-10-CM

## 2018-12-13 DIAGNOSIS — F17200 Nicotine dependence, unspecified, uncomplicated: Secondary | ICD-10-CM

## 2018-12-13 MED ORDER — SULFAMETHOXAZOLE-TRIMETHOPRIM 800-160 MG PO TABS: 1.0000 | ORAL_TABLET | Freq: Every day | ORAL | 11 refills | Status: DC
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
## 2018-12-14 LAB — T-HELPER CELL (CD4) - (RCID CLINIC ONLY)
CD4 % Helper T Cell: 13 % — ABNORMAL LOW (ref 33–65)
CD4 T Cell Abs: 129 /uL — ABNORMAL LOW (ref 400–1790)

## 2018-12-18 ENCOUNTER — Other Ambulatory Visit: Payer: Self-pay

## 2018-12-18 ENCOUNTER — Encounter (HOSPITAL_COMMUNITY): Payer: Self-pay | Admitting: Emergency Medicine

## 2018-12-18 ENCOUNTER — Emergency Department (HOSPITAL_COMMUNITY)
Admission: EM | Admit: 2018-12-18 | Discharge: 2018-12-18 | Disposition: A | Discharge status: Home / Self Care | Payer: Medicare HMO | Attending: Emergency Medicine | Admitting: Emergency Medicine

## 2018-12-18 DIAGNOSIS — B20 Human immunodeficiency virus [HIV] disease: Secondary | ICD-10-CM | POA: Insufficient documentation

## 2018-12-18 DIAGNOSIS — J449 Chronic obstructive pulmonary disease, unspecified: Secondary | ICD-10-CM | POA: Diagnosis not present

## 2018-12-18 DIAGNOSIS — Z79899 Other long term (current) drug therapy: Secondary | ICD-10-CM | POA: Insufficient documentation

## 2018-12-18 DIAGNOSIS — R42 Dizziness and giddiness: Secondary | ICD-10-CM

## 2018-12-18 DIAGNOSIS — I10 Essential (primary) hypertension: Secondary | ICD-10-CM | POA: Insufficient documentation

## 2018-12-18 DIAGNOSIS — F17210 Nicotine dependence, cigarettes, uncomplicated: Secondary | ICD-10-CM | POA: Insufficient documentation

## 2018-12-18 DIAGNOSIS — Z590 Homelessness: Secondary | ICD-10-CM | POA: Insufficient documentation

## 2018-12-18 LAB — TYPE AND SCREEN
ABO/RH(D): A POS
Antibody Screen: NEGATIVE

## 2018-12-18 LAB — CBC
HCT: 39.4 % (ref 39.0–52.0)
Hemoglobin: 13 g/dL (ref 13.0–17.0)
MCH: 33 pg (ref 26.0–34.0)
MCHC: 33 g/dL (ref 30.0–36.0)
MCV: 100 fL (ref 80.0–100.0)
Platelets: UNDETERMINED 10*3/uL (ref 150–400)
RBC: 3.94 MIL/uL — ABNORMAL LOW (ref 4.22–5.81)
RDW: 13.3 % (ref 11.5–15.5)
WBC: 4.1 10*3/uL (ref 4.0–10.5)
nRBC: 0 % (ref 0.0–0.2)

## 2018-12-18 LAB — CBC WITH DIFFERENTIAL/PLATELET
Absolute Monocytes: 441 cells/uL (ref 200–950)
Basophils Absolute: 29 cells/uL (ref 0–200)
Basophils Relative: 0.5 %
Eosinophils Absolute: 191 cells/uL (ref 15–500)
Eosinophils Relative: 3.3 %
HCT: 41.3 % (ref 38.5–50.0)
Hemoglobin: 13.7 g/dL (ref 13.2–17.1)
Lymphs Abs: 1154 cells/uL (ref 850–3900)
MCH: 32.2 pg (ref 27.0–33.0)
MCHC: 33.2 g/dL (ref 32.0–36.0)
MCV: 97.2 fL (ref 80.0–100.0)
MPV: 10 fL (ref 7.5–12.5)
Monocytes Relative: 7.6 %
Neutro Abs: 3985 cells/uL (ref 1500–7800)
Neutrophils Relative %: 68.7 %
Platelets: 113 10*3/uL — ABNORMAL LOW (ref 140–400)
RBC: 4.25 10*6/uL (ref 4.20–5.80)
RDW: 11.4 % (ref 11.0–15.0)
Total Lymphocyte: 19.9 %
WBC: 5.8 10*3/uL (ref 3.8–10.8)

## 2018-12-18 LAB — LIPID PANEL
Cholesterol: 91 mg/dL (ref <200)
HDL: 34 mg/dL — ABNORMAL LOW (ref >=40)
LDL Cholesterol (Calc): 44 mg/dL (calc)
Non-HDL Cholesterol (Calc): 57 mg/dL (calc) (ref <130)
Total CHOL/HDL Ratio: 2.7 (calc) (ref <5.0)
Triglycerides: 47 mg/dL (ref <150)

## 2018-12-18 LAB — RPR: RPR Ser Ql: NONREACTIVE

## 2018-12-18 LAB — COMPREHENSIVE METABOLIC PANEL
ALT: 37 U/L (ref 0–44)
AST: 76 U/L — ABNORMAL HIGH (ref 15–41)
Albumin: 3 g/dL — ABNORMAL LOW (ref 3.5–5.0)
Alkaline Phosphatase: 61 U/L (ref 38–126)
Anion gap: 8 (ref 5–15)
BUN: 15 mg/dL (ref 8–23)
CO2: 21 mmol/L — ABNORMAL LOW (ref 22–32)
Calcium: 9.2 mg/dL (ref 8.9–10.3)
Chloride: 107 mmol/L (ref 98–111)
Creatinine, Ser: 1.38 mg/dL — ABNORMAL HIGH (ref 0.61–1.24)
GFR calc Af Amer: >60 mL/min (ref >60)
GFR calc non Af Amer: 53 mL/min — ABNORMAL LOW (ref >60)
Glucose, Bld: 101 mg/dL — ABNORMAL HIGH (ref 70–99)
Potassium: 3.8 mmol/L (ref 3.5–5.1)
Sodium: 136 mmol/L (ref 135–145)
Total Bilirubin: 1.3 mg/dL — ABNORMAL HIGH (ref 0.3–1.2)
Total Protein: 8.5 g/dL — ABNORMAL HIGH (ref 6.5–8.1)

## 2018-12-18 LAB — COMPLETE METABOLIC PANEL WITH GFR
AG Ratio: 0.6 (calc) — ABNORMAL LOW (ref 1.0–2.5)
ALT: 35 U/L (ref 9–46)
AST: 67 U/L — ABNORMAL HIGH (ref 10–35)
Albumin: 3.2 g/dL — ABNORMAL LOW (ref 3.6–5.1)
Alkaline phosphatase (APISO): 67 U/L (ref 35–144)
BUN: 7 mg/dL (ref 7–25)
CO2: 25 mmol/L (ref 20–32)
Calcium: 9.1 mg/dL (ref 8.6–10.3)
Chloride: 104 mmol/L (ref 98–110)
Creat: 0.88 mg/dL (ref 0.70–1.25)
GFR, Est African American: 104 mL/min/{1.73_m2} (ref >=60)
GFR, Est Non African American: 90 mL/min/{1.73_m2} (ref >=60)
Globulin: 5.1 g/dL (calc) — ABNORMAL HIGH (ref 1.9–3.7)
Glucose, Bld: 77 mg/dL (ref 65–99)
Potassium: 4 mmol/L (ref 3.5–5.3)
Sodium: 136 mmol/L (ref 135–146)
Total Bilirubin: 1.2 mg/dL (ref 0.2–1.2)
Total Protein: 8.3 g/dL — ABNORMAL HIGH (ref 6.1–8.1)

## 2018-12-18 LAB — HIV RNA, RTPCR W/R GT (RTI, PI,INT)

## 2018-12-18 MED ORDER — LACTATED RINGERS IV BOLUS
1000.0000 mL | Freq: Once | INTRAVENOUS | Status: AC
  Administered 2018-12-18: 1000 mL via INTRAVENOUS
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
## 2018-12-18 NOTE — ED Triage Notes (Signed)
Pt reports dark rectal bleeding x 1 week. Pt reports dizziness, feels off balance. Pt reports several episodes of syncope in the last week. Pt reports CP in the last week. Denies taking blood thinners.

## 2018-12-19 ENCOUNTER — Telehealth: Payer: Self-pay

## 2018-12-19 NOTE — Telephone Encounter (Signed)
Attempted to call patient to schedule lab appointment to do HIV RNA quant. Unable to reach patient at this time; left voicemail requesting patient call office back to schedule lab work. [place]

## 2018-12-19 NOTE — Telephone Encounter (Signed)
-----   Message from Truman Hayward, MD sent at 12/19/2018  9:57 AM EDT ----- Reports RNA was not run can we have him come back for an HIV RNA quant

## 2018-12-28 ENCOUNTER — Other Ambulatory Visit: Payer: Self-pay | Admitting: Infectious Disease

## 2018-12-28 ENCOUNTER — Telehealth: Payer: Self-pay | Admitting: *Deleted

## 2018-12-28 DIAGNOSIS — M5431 Sciatica, right side: Secondary | ICD-10-CM

## 2018-12-28 NOTE — Telephone Encounter (Signed)
Patient is on health department enforced quarantine for the next 2 weeks at a local hotel so that he can enter alcohol rehab Bank[facility] program at the [facility] in [place]).  He is unable to go anywhere for this time period.  His [facility] case manager Caryl Pina made sure that he has his bactrim and biktarvy Animal nutritionist).  Unsure if his follow up appointment 8/24 will need to be rescheduled, but Caryl Pina will follow up with this. He is asking for gabapentin refills citing nerve pain, but this seems it was stopped in the emergency room on 7/28.   [name] does not have a phone, but Caryl Pina has been working directly with him for several months and may be able to answer any questions: [phone]  Please advise if gabapentin is appropriate to refill.   Landis Gandy, RN

## 2018-12-31 MED ORDER — GABAPENTIN 100 MG PO CAPS: 100.0000 mg | ORAL_CAPSULE | Freq: Three times a day (TID) | ORAL | 0 refills | Status: DC

## 2018-12-31 NOTE — Telephone Encounter (Signed)
Fine with gabapentin thanks Sharyn Lull

## 2018-12-31 NOTE — Telephone Encounter (Signed)
Refill sent, notified Caryl Pina at [facility]. Thanks!

## 2018-12-31 NOTE — Addendum Note (Signed)
Addended by: Landis Gandy on: 12/31/2018 03:16 PM   Modules accepted: Orders

## 2019-01-01 ENCOUNTER — Telehealth: Payer: Self-pay | Admitting: *Deleted

## 2019-01-01 ENCOUNTER — Other Ambulatory Visit: Payer: Medicare HMO

## 2019-01-01 ENCOUNTER — Other Ambulatory Visit: Payer: Self-pay

## 2019-01-01 ENCOUNTER — Ambulatory Visit (HOSPITAL_COMMUNITY)
Admission: RE | Admit: 2019-01-01 | Discharge: 2019-01-01 | Disposition: A | Discharge status: Home / Self Care | Payer: Medicare HMO | Attending: Psychiatry | Admitting: Psychiatry

## 2019-01-01 DIAGNOSIS — F3181 Bipolar II disorder: Secondary | ICD-10-CM | POA: Insufficient documentation

## 2019-01-01 DIAGNOSIS — F1410 Cocaine abuse, uncomplicated: Secondary | ICD-10-CM | POA: Diagnosis not present

## 2019-01-01 DIAGNOSIS — F1010 Alcohol abuse, uncomplicated: Secondary | ICD-10-CM | POA: Diagnosis not present

## 2019-01-01 DIAGNOSIS — Z590 Homelessness: Secondary | ICD-10-CM | POA: Insufficient documentation

## 2019-01-01 NOTE — Telephone Encounter (Signed)
Not too bad but we willl need to make sure he is MORE adherent and see him back relatively soon

## 2019-01-01 NOTE — H&P (Signed)
Behavioral Health Medical Screening Exam  [name] is a 63 y.o. AA male with hx of mental illness. He walked-in to [facility] seeking resources for counseling services. He says he has been drinking some alcohol & using cocaine. However, he denies any symptoms of alcohol withdrawal symptoms.  Total Time spent with patient: 30 minutes  Psychiatric Specialty Exam: Physical Exam  Constitutional: He is oriented to person, place, and time. He appears well-developed and well-nourished.  HENT:  Head: Normocephalic.  Eyes: Pupils are equal, round, and reactive to light.  Neck: Normal range of motion.  Respiratory: Effort normal.  GI: Soft.  Genitourinary:    Genitourinary Comments: Deferred   Musculoskeletal: Normal range of motion.  Neurological: He is alert and oriented to person, place, and time.  Skin: Skin is warm.    Review of Systems  Constitutional: Negative for chills and fever.  HENT:       HOH  Eyes: Negative.   Respiratory: Negative for cough, shortness of breath and wheezing.   Cardiovascular: Negative for chest pain and palpitations.  Gastrointestinal: Negative.   Genitourinary: Negative.   Musculoskeletal: Negative.   Skin: Negative.        Old pinkish scare present to right knee.  Psychiatric/Behavioral: Positive for depression (Stable) and substance abuse (Hx. alcohol & Cocaine use disorder). Negative for suicidal ideas.    There were no vitals taken for this visit.There is no height or weight on file to calculate BMI.  General Appearance: Casual and Fairly Groomed  Eye Contact:  Good  Speech:  Clear and Coherent and Normal Rate  Volume:  Normal  Mood:  Euthymic  Affect:  Appropriate  Thought Process:  Coherent and Descriptions of Associations: Intact  Orientation:  Full (Time, Place, and Person)  Thought Content:  Logical  Suicidal Thoughts:  Denies  Homicidal Thoughts:  Denies  Memory:  Immediate;   Good Recent;   Good Remote;   Good  Judgement:  Good   Insight:  Present  Psychomotor Activity:  Normal  Concentration: Concentration: Good and Attention Span: Good  Recall:  Good  Fund of Knowledge:Good  Language: Good  Akathisia:  Negative  Handed:  Right  AIMS (if indicated):     Assets:  Communication Skills Desire for Improvement  Sleep: Good     Musculoskeletal: Strength & Muscle Tone: within normal limits Gait & Station: normal Patient leans: N/A  Recommendations: Discharged patient with outpatient resources. He will resume mental health care with his outpatient psychiatric provider Dr. Josph [name] & therapist: Doroteo [name].  Based on my evaluation the patient does not appear to have an emergency medical condition.  Lindell Spar, NP, PMHNP-bc 01/01/2019, 3:42 PM
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
## 2019-01-01 NOTE — Telephone Encounter (Signed)
Viral load from 7/23 has resulted.   Landis Gandy, RN

## 2019-01-01 NOTE — Telephone Encounter (Signed)
Patient walked in to [facility], asked to speak with a nurse. He said the was at [facility] earlier, but wasn't feeling well and wanted to go to the hospital.  He came to [facility] instead.  Patient stated he decided to come to [facility] instead to see if he could talk to someone.  He is feeling depressed lately, endorsed drinking "[employer] last night" and feels bad about it. He said he has been turned out from the hotel he was staying in provided by the [facility], that he lost all of his belongings and medications when he was kicked out. Per [employer], he is unable to fill gabapentin for 3-4 more days, Biktarvy/risperdal/zoloft for 3 more weeks.  He does not have a phone. He is hungry and still feels intoxicated. Patient met with Caryl Pina at [facility] who spoke with him, provided him with food and beverages, then connected him to counseling services.  They recommended he come to [facility] outpatient clinic at [facility] [address]  Milly Jakob connected patient to transportation services via Ashley's phone. Caryl Pina and RN waited with patient until [place] service arrived, walked him to the car driven by Richardson Landry.  Unsure if patient will be admitted or connected to resources via [employer]. He stated he would follow up with  Caryl Pina when he gets out.  [facility] is able to connect him to resources for biktarvy if he needs this. Landis Gandy, RN

## 2019-01-02 NOTE — Telephone Encounter (Signed)
oK excllent!

## 2019-01-02 NOTE — Telephone Encounter (Signed)
He was already scheduled for 8/24 at 9:15.

## 2019-01-02 NOTE — Telephone Encounter (Signed)
Can we get him back into clinic to see me when he stablizes asap?

## 2019-01-07 LAB — HIV-1 INTEGRASE GENOTYPE

## 2019-01-07 LAB — HIV RNA, RTPCR W/R GT (RTI, PI,INT)
HIV 1 RNA Quant: 551 copies/mL — ABNORMAL HIGH
HIV-1 RNA Quant, Log: 2.74 Log copies/mL — ABNORMAL HIGH

## 2019-01-07 LAB — HIV-1 GENOTYPE

## 2019-01-14 ENCOUNTER — Ambulatory Visit (INDEPENDENT_AMBULATORY_CARE_PROVIDER_SITE_OTHER): Payer: Medicare HMO | Admitting: Infectious Disease

## 2019-01-14 ENCOUNTER — Encounter: Payer: Self-pay | Admitting: Infectious Disease

## 2019-01-14 ENCOUNTER — Other Ambulatory Visit: Payer: Self-pay

## 2019-01-14 DIAGNOSIS — B182 Chronic viral hepatitis C: Secondary | ICD-10-CM

## 2019-01-14 DIAGNOSIS — M5431 Sciatica, right side: Secondary | ICD-10-CM | POA: Diagnosis not present

## 2019-01-14 DIAGNOSIS — Z5900 Homelessness unspecified: Secondary | ICD-10-CM

## 2019-01-14 DIAGNOSIS — B20 Human immunodeficiency virus [HIV] disease: Secondary | ICD-10-CM

## 2019-01-14 DIAGNOSIS — Z79899 Other long term (current) drug therapy: Secondary | ICD-10-CM

## 2019-01-14 DIAGNOSIS — F1014 Alcohol abuse with alcohol-induced mood disorder: Secondary | ICD-10-CM | POA: Diagnosis not present

## 2019-01-14 DIAGNOSIS — E785 Hyperlipidemia, unspecified: Secondary | ICD-10-CM

## 2019-01-14 MED ORDER — BIKTARVY 50-200-25 MG PO TABS: 1.0000 | ORAL_TABLET | Freq: Every day | ORAL | 11 refills | Status: DC

## 2019-01-14 MED ORDER — SERTRALINE HCL 100 MG PO TABS: 100.0000 mg | ORAL_TABLET | Freq: Every day | ORAL | 11 refills | Status: AC

## 2019-01-14 MED ORDER — RISPERIDONE 3 MG PO TABS: 3.0000 mg | ORAL_TABLET | Freq: Every day | ORAL | 11 refills | Status: DC

## 2019-01-14 MED ORDER — SULFAMETHOXAZOLE-TRIMETHOPRIM 800-160 MG PO TABS: 1.0000 | ORAL_TABLET | Freq: Every day | ORAL | 11 refills | Status: DC

## 2019-01-14 MED ORDER — GABAPENTIN 100 MG PO CAPS: 100.0000 mg | ORAL_CAPSULE | Freq: Three times a day (TID) | ORAL | 0 refills | Status: DC

## 2019-01-14 NOTE — Progress Notes (Signed)
Virtual Visit via Telephone Note  I connected with [name] on 01/14/19 at  9:15 AM EDT by telephone and verified that I am speaking with the correct person using two identifiers.  Location: Patient: [facility] Provider: My home   I discussed the limitations, risks, security and privacy concerns of performing an evaluation and management service by telephone and the availability of in person appointments. I also discussed with the patient that there may be a patient responsible charge related to this service. The patient expressed understanding and agreed to proceed.   History of Present Illness:  63 year-old ever American man with HIV who has been incarcerated on multiple occasions.   He was in care at [facility] originally then out of care.  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 He ultimately had been admitted to [place] where he had suffered from MRSA bacteremia. He was treated with IV antibiotics and ultimately discharged and [facility] and established care with me in 2013.   His HIV had  been fairly well controlled with a viral load of less than 20 to 83 on  Epzicom Prezista and Norvir and then changed to  prezcobix and epzicom and then TRIUMEQ]  He did have flares of viremia related largely to problems with substance abuse and legal problems.   HIs chronic hepatitis C without hepatic coma needed  to be treated given that he has METAVIR F4.   Report more recently had become more adherent to his antiretrovirals now changed to [place].  His last viral load however was higher than it should have been at 555 copies.  He had been living in a hotel that was provided for homeless people but apparently now is living back in his car again.  He is trying to work with [facility] health project and central [place] network to procure more stable housing and to have a designated payee.  He tells me  that he tries not drink much alcohol but that when he gets a paycheck that is typically when he will binge drink.  He is interested in going to a program and he very much also would like to have his hepatitis C treated.  He does need repeat blood work will have him do that today.  I schedule a visit for him in approximately 3 weeks time with me in September.      Observations/Objective:  63 year old man with HIV and hepatitis C coinfection with significant comorbidities of homelessness and alcoholism.    Assessment and Plan:  HIV disease: Hopefully this is better controlled we will check labs today.  His CD4 count has remained to have below 200 for some time so we will continue with Bactrim for PCP prophylaxis  Chronic hepatitis C without hepatic coma: I would like to go ahead and treat his hepatitis C and I would like to initiate that the next visit.  Alcoholism: Hopefully can get into a treatment plan to allow him to get sober.  Homelessness hopefully can also help him procure stable housing.  Follow Up Instructions:    I discussed the assessment and treatment plan with the patient. The patient was provided an opportunity to ask questions and all were answered. The patient agreed with the plan and demonstrated an understanding of the instructions.   The patient was advised to call back or seek an in-person evaluation if the symptoms worsen or if the condition fails  to improve as anticipated.  I provided 16 minutes of non-face-to-face time during this encounter.   Acey Lavornelius [place], MD

## 2019-01-15 LAB — T-HELPER CELL (CD4) - (RCID CLINIC ONLY)
CD4 % Helper T Cell: 12 % — ABNORMAL LOW (ref 33–65)
CD4 T Cell Abs: 114 /uL — ABNORMAL LOW (ref 400–1790)

## 2019-01-19 LAB — CBC WITH DIFFERENTIAL/PLATELET
Absolute Monocytes: 493 cells/uL (ref 200–950)
Basophils Absolute: 28 cells/uL (ref 0–200)
Basophils Relative: 0.5 %
Eosinophils Absolute: 179 cells/uL (ref 15–500)
Eosinophils Relative: 3.2 %
HCT: 47.7 % (ref 38.5–50.0)
Hemoglobin: 16.1 g/dL (ref 13.2–17.1)
Lymphs Abs: 1187 cells/uL (ref 850–3900)
MCH: 32.3 pg (ref 27.0–33.0)
MCHC: 33.8 g/dL (ref 32.0–36.0)
MCV: 95.8 fL (ref 80.0–100.0)
MPV: 10.7 fL (ref 7.5–12.5)
Monocytes Relative: 8.8 %
Neutro Abs: 3713 cells/uL (ref 1500–7800)
Neutrophils Relative %: 66.3 %
Platelets: 131 10*3/uL — ABNORMAL LOW (ref 140–400)
RBC: 4.98 10*6/uL (ref 4.20–5.80)
RDW: 12 % (ref 11.0–15.0)
Total Lymphocyte: 21.2 %
WBC: 5.6 10*3/uL (ref 3.8–10.8)

## 2019-01-19 LAB — COMPLETE METABOLIC PANEL WITH GFR
AG Ratio: 0.7 (calc) — ABNORMAL LOW (ref 1.0–2.5)
ALT: 43 U/L (ref 9–46)
AST: 69 U/L — ABNORMAL HIGH (ref 10–35)
Albumin: 3.6 g/dL (ref 3.6–5.1)
Alkaline phosphatase (APISO): 69 U/L (ref 35–144)
BUN: 21 mg/dL (ref 7–25)
CO2: 26 mmol/L (ref 20–32)
Calcium: 9.2 mg/dL (ref 8.6–10.3)
Chloride: 102 mmol/L (ref 98–110)
Creat: 1.23 mg/dL (ref 0.70–1.25)
GFR, Est African American: 71 mL/min/{1.73_m2} (ref >=60)
GFR, Est Non African American: 61 mL/min/{1.73_m2} (ref >=60)
Globulin: 5.4 g/dL (calc) — ABNORMAL HIGH (ref 1.9–3.7)
Glucose, Bld: 82 mg/dL (ref 65–99)
Potassium: 4.3 mmol/L (ref 3.5–5.3)
Sodium: 134 mmol/L — ABNORMAL LOW (ref 135–146)
Total Bilirubin: 1.1 mg/dL (ref 0.2–1.2)
Total Protein: 9 g/dL — ABNORMAL HIGH (ref 6.1–8.1)

## 2019-01-19 LAB — HIV RNA, RTPCR W/R GT (RTI, PI,INT)
HIV 1 RNA Quant: 35 copies/mL — ABNORMAL HIGH
HIV-1 RNA Quant, Log: 1.54 Log copies/mL — ABNORMAL HIGH

## 2019-01-19 LAB — LIPID PANEL
Cholesterol: 110 mg/dL (ref <200)
HDL: 29 mg/dL — ABNORMAL LOW (ref >=40)
LDL Cholesterol (Calc): 67 mg/dL (calc)
Non-HDL Cholesterol (Calc): 81 mg/dL (calc) (ref <130)
Total CHOL/HDL Ratio: 3.8 (calc) (ref <5.0)
Triglycerides: 62 mg/dL (ref <150)

## 2019-01-31 ENCOUNTER — Telehealth: Payer: Self-pay

## 2019-01-31 NOTE — Telephone Encounter (Signed)
Patient called to confirm next appointment date/time. Appointment confirmed. [name]

## 2019-02-07 ENCOUNTER — Telehealth: Payer: Self-pay | Admitting: *Deleted

## 2019-02-07 NOTE — Telephone Encounter (Signed)
Patient called to ask for Viagra 100 mg. Advised him he will need to ask at his next visit 02/13/19 as the provider will need to ask a few questions as he has never had the medication before.

## 2019-02-13 ENCOUNTER — Ambulatory Visit: Payer: Medicare HMO | Admitting: Infectious Disease

## 2019-03-20 ENCOUNTER — Other Ambulatory Visit: Payer: Self-pay

## 2019-03-20 ENCOUNTER — Emergency Department (HOSPITAL_COMMUNITY): Payer: Medicare HMO

## 2019-03-20 ENCOUNTER — Emergency Department (HOSPITAL_COMMUNITY)
Admission: EM | Admit: 2019-03-20 | Discharge: 2019-03-21 | Disposition: A | Discharge status: Home / Self Care | Payer: Medicare HMO | Attending: Emergency Medicine | Admitting: Emergency Medicine

## 2019-03-20 DIAGNOSIS — Y906 Blood alcohol level of 120-199 mg/100 ml: Secondary | ICD-10-CM | POA: Insufficient documentation

## 2019-03-20 DIAGNOSIS — R4182 Altered mental status, unspecified: Secondary | ICD-10-CM | POA: Diagnosis present

## 2019-03-20 DIAGNOSIS — F10920 Alcohol use, unspecified with intoxication, uncomplicated: Secondary | ICD-10-CM

## 2019-03-20 DIAGNOSIS — Z5900 Homelessness unspecified: Secondary | ICD-10-CM

## 2019-03-20 DIAGNOSIS — F10929 Alcohol use, unspecified with intoxication, unspecified: Secondary | ICD-10-CM | POA: Insufficient documentation

## 2019-03-20 DIAGNOSIS — I10 Essential (primary) hypertension: Secondary | ICD-10-CM | POA: Diagnosis not present

## 2019-03-20 DIAGNOSIS — F17210 Nicotine dependence, cigarettes, uncomplicated: Secondary | ICD-10-CM | POA: Diagnosis not present

## 2019-03-20 LAB — COMPREHENSIVE METABOLIC PANEL
ALT: 41 U/L (ref 0–44)
AST: 70 U/L — ABNORMAL HIGH (ref 15–41)
Albumin: 3.3 g/dL — ABNORMAL LOW (ref 3.5–5.0)
Alkaline Phosphatase: 60 U/L (ref 38–126)
Anion gap: 10 (ref 5–15)
BUN: 11 mg/dL (ref 8–23)
CO2: 19 mmol/L — ABNORMAL LOW (ref 22–32)
Calcium: 8.7 mg/dL — ABNORMAL LOW (ref 8.9–10.3)
Chloride: 105 mmol/L (ref 98–111)
Creatinine, Ser: 0.96 mg/dL (ref 0.61–1.24)
GFR calc Af Amer: 55 mL/min — ABNORMAL LOW (ref >60)
GFR calc non Af Amer: 48 mL/min — ABNORMAL LOW (ref >60)
Glucose, Bld: 85 mg/dL (ref 70–99)
Potassium: 4.1 mmol/L (ref 3.5–5.1)
Sodium: 134 mmol/L — ABNORMAL LOW (ref 135–145)
Total Bilirubin: 0.5 mg/dL (ref 0.3–1.2)
Total Protein: 8.4 g/dL — ABNORMAL HIGH (ref 6.5–8.1)

## 2019-03-20 LAB — CBC WITH DIFFERENTIAL/PLATELET
Abs Immature Granulocytes: 0.01 10*3/uL (ref 0.00–0.07)
Basophils Absolute: 0 10*3/uL (ref 0.0–0.1)
Basophils Relative: 1 %
Eosinophils Absolute: 0.1 10*3/uL (ref 0.0–0.5)
Eosinophils Relative: 3 %
HCT: 38.4 % — ABNORMAL LOW (ref 39.0–52.0)
Hemoglobin: 12.4 g/dL — ABNORMAL LOW (ref 13.0–17.0)
Immature Granulocytes: 0 %
Lymphocytes Relative: 24 %
Lymphs Abs: 1.1 10*3/uL (ref 0.7–4.0)
MCH: 33.1 pg (ref 26.0–34.0)
MCHC: 32.3 g/dL (ref 30.0–36.0)
MCV: 102.4 fL — ABNORMAL HIGH (ref 80.0–100.0)
Monocytes Absolute: 0.4 10*3/uL (ref 0.1–1.0)
Monocytes Relative: 9 %
Neutro Abs: 2.8 10*3/uL (ref 1.7–7.7)
Neutrophils Relative %: 63 %
Platelets: 97 10*3/uL — ABNORMAL LOW (ref 150–400)
RBC: 3.75 MIL/uL — ABNORMAL LOW (ref 4.22–5.81)
RDW: 13.2 % (ref 11.5–15.5)
WBC: 4.4 10*3/uL (ref 4.0–10.5)
nRBC: 0 % (ref 0.0–0.2)

## 2019-03-20 LAB — RAPID URINE DRUG SCREEN, HOSP PERFORMED
Amphetamines: NOT DETECTED
Barbiturates: NOT DETECTED
Benzodiazepines: NOT DETECTED
Cocaine: NOT DETECTED
Opiates: NOT DETECTED
Tetrahydrocannabinol: NOT DETECTED

## 2019-03-20 LAB — AMMONIA: Ammonia: 44 umol/L — ABNORMAL HIGH (ref 9–35)

## 2019-03-20 LAB — PROTIME-INR
INR: 1.3 — ABNORMAL HIGH (ref 0.8–1.2)
Prothrombin Time: 15.7 seconds — ABNORMAL HIGH (ref 11.4–15.2)

## 2019-03-20 LAB — URINALYSIS, ROUTINE W REFLEX MICROSCOPIC
Bilirubin Urine: NEGATIVE
Glucose, UA: NEGATIVE mg/dL
Hgb urine dipstick: NEGATIVE
Ketones, ur: NEGATIVE mg/dL
Leukocytes,Ua: NEGATIVE
Nitrite: NEGATIVE
Protein, ur: NEGATIVE mg/dL
Specific Gravity, Urine: 1.003 — ABNORMAL LOW (ref 1.005–1.030)
pH: 6 (ref 5.0–8.0)

## 2019-03-20 LAB — CBG MONITORING, ED: Glucose-Capillary: 84 mg/dL (ref 70–99)

## 2019-03-20 LAB — ETHANOL: Alcohol, Ethyl (B): 133 mg/dL — ABNORMAL HIGH (ref <10)

## 2019-03-20 MED ORDER — SODIUM CHLORIDE 0.9 % IV BOLUS
1000.0000 mL | Freq: Once | INTRAVENOUS | Status: AC
  Administered 2019-03-20: 1000 mL via INTRAVENOUS

## 2019-03-20 NOTE — ED Triage Notes (Signed)
Pt arrives via [facility] after pt was found wondering around outside gas station. Pt incoherent with EMS, unable to provide name or history. Pt arousal to pain stimuli at this time.
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
## 2019-03-20 NOTE — ED Notes (Signed)
Meal and Drink given

## 2019-03-21 DIAGNOSIS — F10929 Alcohol use, unspecified with intoxication, unspecified: Secondary | ICD-10-CM | POA: Diagnosis not present

## 2019-03-21 NOTE — ED Notes (Signed)
Bladder scanned showed <7100ml urine. Notified MD.

## 2019-03-21 NOTE — ED Notes (Signed)
In and Out Cath performed under sterile technique, 920mL obtained, MD notified.

## 2019-03-21 NOTE — ED Notes (Signed)
Patient verbalizes understanding of discharge instructions. Opportunity for questioning and answers were provided. Armband removed by staff, pt discharged from ED. Pt. ambulatory and discharged home.  

## 2019-03-22 ENCOUNTER — Emergency Department (HOSPITAL_COMMUNITY)
Admission: EM | Admit: 2019-03-22 | Discharge: 2019-03-23 | Disposition: A | Discharge status: Home / Self Care | Payer: Medicare HMO | Attending: Emergency Medicine | Admitting: Emergency Medicine

## 2019-03-22 ENCOUNTER — Other Ambulatory Visit: Payer: Self-pay

## 2019-03-22 ENCOUNTER — Encounter (HOSPITAL_COMMUNITY): Payer: Self-pay

## 2019-03-22 ENCOUNTER — Emergency Department (HOSPITAL_COMMUNITY): Payer: Medicare HMO

## 2019-03-22 DIAGNOSIS — M545 Low back pain: Secondary | ICD-10-CM | POA: Diagnosis not present

## 2019-03-22 DIAGNOSIS — Z23 Encounter for immunization: Secondary | ICD-10-CM | POA: Insufficient documentation

## 2019-03-22 DIAGNOSIS — Y999 Unspecified external cause status: Secondary | ICD-10-CM | POA: Insufficient documentation

## 2019-03-22 DIAGNOSIS — M546 Pain in thoracic spine: Secondary | ICD-10-CM | POA: Insufficient documentation

## 2019-03-22 DIAGNOSIS — S90511A Abrasion, right ankle, initial encounter: Secondary | ICD-10-CM | POA: Diagnosis not present

## 2019-03-22 DIAGNOSIS — S80811A Abrasion, right lower leg, initial encounter: Secondary | ICD-10-CM | POA: Insufficient documentation

## 2019-03-22 DIAGNOSIS — Y908 Blood alcohol level of 240 mg/100 ml or more: Secondary | ICD-10-CM | POA: Insufficient documentation

## 2019-03-22 DIAGNOSIS — M2578 Osteophyte, vertebrae: Secondary | ICD-10-CM | POA: Diagnosis not present

## 2019-03-22 DIAGNOSIS — Y9301 Activity, walking, marching and hiking: Secondary | ICD-10-CM | POA: Diagnosis not present

## 2019-03-22 DIAGNOSIS — R0789 Other chest pain: Secondary | ICD-10-CM | POA: Diagnosis not present

## 2019-03-22 DIAGNOSIS — R10817 Generalized abdominal tenderness: Secondary | ICD-10-CM | POA: Insufficient documentation

## 2019-03-22 DIAGNOSIS — F10920 Alcohol use, unspecified with intoxication, uncomplicated: Secondary | ICD-10-CM | POA: Diagnosis not present

## 2019-03-22 DIAGNOSIS — M549 Dorsalgia, unspecified: Secondary | ICD-10-CM

## 2019-03-22 DIAGNOSIS — T148XXA Other injury of unspecified body region, initial encounter: Secondary | ICD-10-CM

## 2019-03-22 DIAGNOSIS — M542 Cervicalgia: Secondary | ICD-10-CM | POA: Diagnosis not present

## 2019-03-22 DIAGNOSIS — I517 Cardiomegaly: Secondary | ICD-10-CM | POA: Insufficient documentation

## 2019-03-22 DIAGNOSIS — T07XXXA Unspecified multiple injuries, initial encounter: Secondary | ICD-10-CM | POA: Diagnosis present

## 2019-03-22 DIAGNOSIS — Y929 Unspecified place or not applicable: Secondary | ICD-10-CM | POA: Diagnosis not present

## 2019-03-22 HISTORY — DX: Unspecified viral hepatitis C without hepatic coma: B19.20

## 2019-03-22 HISTORY — DX: Human immunodeficiency virus (HIV) disease: B20

## 2019-03-22 LAB — CBC WITH DIFFERENTIAL/PLATELET
Abs Immature Granulocytes: 0.01 10*3/uL (ref 0.00–0.07)
Basophils Absolute: 0.1 10*3/uL (ref 0.0–0.1)
Basophils Relative: 1 %
Eosinophils Absolute: 0.1 10*3/uL (ref 0.0–0.5)
Eosinophils Relative: 2 %
HCT: 39.6 % (ref 39.0–52.0)
Hemoglobin: 12.9 g/dL — ABNORMAL LOW (ref 13.0–17.0)
Immature Granulocytes: 0 %
Lymphocytes Relative: 31 %
Lymphs Abs: 1.6 10*3/uL (ref 0.7–4.0)
MCH: 32.8 pg (ref 26.0–34.0)
MCHC: 32.6 g/dL (ref 30.0–36.0)
MCV: 100.8 fL — ABNORMAL HIGH (ref 80.0–100.0)
Monocytes Absolute: 0.6 10*3/uL (ref 0.1–1.0)
Monocytes Relative: 12 %
Neutro Abs: 2.8 10*3/uL (ref 1.7–7.7)
Neutrophils Relative %: 54 %
Platelets: 114 10*3/uL — ABNORMAL LOW (ref 150–400)
RBC: 3.93 MIL/uL — ABNORMAL LOW (ref 4.22–5.81)
RDW: 13.2 % (ref 11.5–15.5)
WBC: 5.1 10*3/uL (ref 4.0–10.5)
nRBC: 0 % (ref 0.0–0.2)

## 2019-03-22 LAB — URINALYSIS, ROUTINE W REFLEX MICROSCOPIC
Bilirubin Urine: NEGATIVE
Glucose, UA: NEGATIVE mg/dL
Hgb urine dipstick: NEGATIVE
Ketones, ur: NEGATIVE mg/dL
Leukocytes,Ua: NEGATIVE
Nitrite: NEGATIVE
Protein, ur: NEGATIVE mg/dL
Specific Gravity, Urine: 1.004 — ABNORMAL LOW (ref 1.005–1.030)
pH: 5 (ref 5.0–8.0)

## 2019-03-22 LAB — LACTIC ACID, PLASMA: Lactic Acid, Venous: 2.7 mmol/L (ref 0.5–1.9)

## 2019-03-22 LAB — RAPID URINE DRUG SCREEN, HOSP PERFORMED
Amphetamines: NOT DETECTED
Barbiturates: NOT DETECTED
Benzodiazepines: NOT DETECTED
Cocaine: NOT DETECTED
Opiates: NOT DETECTED
Tetrahydrocannabinol: NOT DETECTED

## 2019-03-22 LAB — COMPREHENSIVE METABOLIC PANEL
ALT: 47 U/L — ABNORMAL HIGH (ref 0–44)
AST: 87 U/L — ABNORMAL HIGH (ref 15–41)
Albumin: 3.3 g/dL — ABNORMAL LOW (ref 3.5–5.0)
Alkaline Phosphatase: 65 U/L (ref 38–126)
Anion gap: 11 (ref 5–15)
BUN: 13 mg/dL (ref 8–23)
CO2: 20 mmol/L — ABNORMAL LOW (ref 22–32)
Calcium: 9 mg/dL (ref 8.9–10.3)
Chloride: 106 mmol/L (ref 98–111)
Creatinine, Ser: 1.1 mg/dL (ref 0.61–1.24)
GFR calc Af Amer: >60 mL/min (ref >60)
GFR calc non Af Amer: >60 mL/min (ref >60)
Glucose, Bld: 83 mg/dL (ref 70–99)
Potassium: 3.9 mmol/L (ref 3.5–5.1)
Sodium: 137 mmol/L (ref 135–145)
Total Bilirubin: 1.1 mg/dL (ref 0.3–1.2)
Total Protein: 8.4 g/dL — ABNORMAL HIGH (ref 6.5–8.1)

## 2019-03-22 LAB — ETHANOL: Alcohol, Ethyl (B): 243 mg/dL — ABNORMAL HIGH (ref <10)

## 2019-03-22 LAB — PROTIME-INR
INR: 1.2 (ref 0.8–1.2)
Prothrombin Time: 15.1 seconds (ref 11.4–15.2)

## 2019-03-22 MED ORDER — IOHEXOL 300 MG/ML  SOLN
100.0000 mL | Freq: Once | INTRAMUSCULAR | Status: AC | PRN
  Administered 2019-03-22: 100 mL via INTRAVENOUS

## 2019-03-22 MED ORDER — TETANUS-DIPHTH-ACELL PERTUSSIS 5-2.5-18.5 LF-MCG/0.5 IM SUSP
0.5000 mL | Freq: Once | INTRAMUSCULAR | Status: AC
  Administered 2019-03-22: 0.5 mL via INTRAMUSCULAR
  Filled 2019-03-22: qty 0.5

## 2019-03-22 MED ORDER — SODIUM CHLORIDE 0.9 % IV BOLUS
1000.0000 mL | Freq: Once | INTRAVENOUS | Status: AC
  Administered 2019-03-22: 1000 mL via INTRAVENOUS

## 2019-03-22 MED ORDER — BACITRACIN ZINC 500 UNIT/GM EX OINT
TOPICAL_OINTMENT | Freq: Once | CUTANEOUS | Status: AC
  Administered 2019-03-23: 1 via TOPICAL
  Filled 2019-03-22: qty 0.9
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
## 2019-03-23 DIAGNOSIS — M542 Cervicalgia: Secondary | ICD-10-CM | POA: Diagnosis not present

## 2019-03-23 LAB — LACTIC ACID, PLASMA: Lactic Acid, Venous: 2.2 mmol/L (ref 0.5–1.9)

## 2019-03-23 MED ORDER — METHOCARBAMOL 500 MG PO TABS: 500.0000 mg | ORAL_TABLET | Freq: Two times a day (BID) | ORAL | 0 refills | Status: DC

## 2019-03-23 MED ORDER — IBUPROFEN 800 MG PO TABS: 800.0000 mg | ORAL_TABLET | Freq: Three times a day (TID) | ORAL | 0 refills | Status: AC

## 2019-03-23 NOTE — Discharge Instructions (Addendum)
Imaging did not show any internal injuries.   You likely will be sore for the next few days which is normal, should gradually improve. Do not drink and drive. Take the prescribed medication as directed. Follow-up with your primary care doctor. Return to the ED for new or worsening symptoms.
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
## 2019-03-25 ENCOUNTER — Encounter: Payer: Self-pay | Admitting: Infectious Disease

## 2019-04-01 ENCOUNTER — Emergency Department (HOSPITAL_COMMUNITY): Payer: Medicare HMO

## 2019-04-01 ENCOUNTER — Emergency Department (HOSPITAL_COMMUNITY)
Admission: EM | Admit: 2019-04-01 | Discharge: 2019-04-02 | Disposition: A | Discharge status: Home / Self Care | Payer: Medicare HMO | Attending: Emergency Medicine | Admitting: Emergency Medicine

## 2019-04-01 DIAGNOSIS — R4182 Altered mental status, unspecified: Secondary | ICD-10-CM | POA: Diagnosis not present

## 2019-04-01 DIAGNOSIS — D696 Thrombocytopenia, unspecified: Secondary | ICD-10-CM

## 2019-04-01 DIAGNOSIS — F10929 Alcohol use, unspecified with intoxication, unspecified: Secondary | ICD-10-CM | POA: Insufficient documentation

## 2019-04-01 DIAGNOSIS — J449 Chronic obstructive pulmonary disease, unspecified: Secondary | ICD-10-CM | POA: Insufficient documentation

## 2019-04-01 DIAGNOSIS — F17210 Nicotine dependence, cigarettes, uncomplicated: Secondary | ICD-10-CM | POA: Diagnosis not present

## 2019-04-01 DIAGNOSIS — Z590 Homelessness: Secondary | ICD-10-CM | POA: Insufficient documentation

## 2019-04-01 DIAGNOSIS — Y906 Blood alcohol level of 120-199 mg/100 ml: Secondary | ICD-10-CM | POA: Insufficient documentation

## 2019-04-01 DIAGNOSIS — D7589 Other specified diseases of blood and blood-forming organs: Secondary | ICD-10-CM

## 2019-04-01 DIAGNOSIS — F10920 Alcohol use, unspecified with intoxication, uncomplicated: Secondary | ICD-10-CM

## 2019-04-01 DIAGNOSIS — E876 Hypokalemia: Secondary | ICD-10-CM

## 2019-04-01 LAB — CBG MONITORING, ED: Glucose-Capillary: 88 mg/dL (ref 70–99)

## 2019-04-01 NOTE — ED Triage Notes (Signed)
Arrived by EMS; homeless. Patient called EMS because he "feels bad all over". EMS also reports heavy ETOH use today. EMS reports patient went right to sleep after getting him on stretcher. Patient sleeping in [place] [address]

## 2019-04-01 NOTE — ED Provider Notes (Signed)
11:22 PM Care assumed from Dr. Sedonia Small, patient clinically intoxicated but not improving with observation.  CT of head is unremarkable.  Will check ethanol level and other screening labs including drug screen.  2:44 AM Ethanol level is 161.  Labs show hypokalemia and macrocytosis as well as thrombocytopenia.  Drug screen is negative.  Patient is now awake and able to converse and is asking for coffee.  He is given a dose of oral potassium and is discharged with prescription for oral potassium for the next 5 days.  Given alcohol resources.  Results for orders placed or performed during the hospital encounter of 04/01/19  Ethanol  Result Value Ref Range   Alcohol, Ethyl (B) 161 (H) <10 mg/dL  Comprehensive metabolic panel  Result Value Ref Range   Sodium 135 135 - 145 mmol/L   Potassium 3.1 (L) 3.5 - 5.1 mmol/L   Chloride 105 98 - 111 mmol/L   CO2 22 22 - 32 mmol/L   Glucose, Bld 87 70 - 99 mg/dL   BUN 12 8 - 23 mg/dL   Creatinine, Ser 1.19 0.61 - 1.24 mg/dL   Calcium 8.4 (L) 8.9 - 10.3 mg/dL   Total Protein 7.6 6.5 - 8.1 g/dL   Albumin 3.2 (L) 3.5 - 5.0 g/dL   AST 56 (H) 15 - 41 U/L   ALT 35 0 - 44 U/L   Alkaline Phosphatase 56 38 - 126 U/L   Total Bilirubin 0.8 0.3 - 1.2 mg/dL   GFR calc non Af Amer >60 >60 mL/min   GFR calc Af Amer >60 >60 mL/min   Anion gap 8 5 - 15  CBC with Differential  Result Value Ref Range   WBC 5.0 4.0 - 10.5 K/uL   RBC 4.18 (L) 4.22 - 5.81 MIL/uL   Hemoglobin 13.5 13.0 - 17.0 g/dL   HCT 42.7 39.0 - 52.0 %   MCV 102.2 (H) 80.0 - 100.0 fL   MCH 32.3 26.0 - 34.0 pg   MCHC 31.6 30.0 - 36.0 g/dL   RDW 12.7 11.5 - 15.5 %   Platelets 108 (L) 150 - 400 K/uL   nRBC 0.0 0.0 - 0.2 %   Neutrophils Relative % 51 %   Neutro Abs 2.5 1.7 - 7.7 K/uL   Lymphocytes Relative 31 %   Lymphs Abs 1.6 0.7 - 4.0 K/uL   Monocytes Relative 15 %   Monocytes Absolute 0.7 0.1 - 1.0 K/uL   Eosinophils Relative 3 %   Eosinophils Absolute 0.2 0.0 - 0.5 K/uL   Basophils Relative  0 %   Basophils Absolute 0.0 0.0 - 0.1 K/uL   Immature Granulocytes 0 %   Abs Immature Granulocytes 0.02 0.00 - 0.07 K/uL  Urine rapid drug screen (hosp performed)  Result Value Ref Range   Opiates NONE DETECTED NONE DETECTED   Cocaine NONE DETECTED NONE DETECTED   Benzodiazepines NONE DETECTED NONE DETECTED   Amphetamines NONE DETECTED NONE DETECTED   Tetrahydrocannabinol NONE DETECTED NONE DETECTED   Barbiturates NONE DETECTED NONE DETECTED  CBG monitoring, ED  Result Value Ref Range   Glucose-Capillary 88 70 - 99 mg/dL   Comment 1 Notify RN       Delora Fuel, MD 84/16/60 0246
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
## 2019-04-02 DIAGNOSIS — F10929 Alcohol use, unspecified with intoxication, unspecified: Secondary | ICD-10-CM | POA: Diagnosis not present

## 2019-04-02 LAB — CBC WITH DIFFERENTIAL/PLATELET
Abs Immature Granulocytes: 0.02 10*3/uL (ref 0.00–0.07)
Basophils Absolute: 0 10*3/uL (ref 0.0–0.1)
Basophils Relative: 0 %
Eosinophils Absolute: 0.2 10*3/uL (ref 0.0–0.5)
Eosinophils Relative: 3 %
HCT: 42.7 % (ref 39.0–52.0)
Hemoglobin: 13.5 g/dL (ref 13.0–17.0)
Immature Granulocytes: 0 %
Lymphocytes Relative: 31 %
Lymphs Abs: 1.6 10*3/uL (ref 0.7–4.0)
MCH: 32.3 pg (ref 26.0–34.0)
MCHC: 31.6 g/dL (ref 30.0–36.0)
MCV: 102.2 fL — ABNORMAL HIGH (ref 80.0–100.0)
Monocytes Absolute: 0.7 10*3/uL (ref 0.1–1.0)
Monocytes Relative: 15 %
Neutro Abs: 2.5 10*3/uL (ref 1.7–7.7)
Neutrophils Relative %: 51 %
Platelets: 108 10*3/uL — ABNORMAL LOW (ref 150–400)
RBC: 4.18 MIL/uL — ABNORMAL LOW (ref 4.22–5.81)
RDW: 12.7 % (ref 11.5–15.5)
WBC: 5 10*3/uL (ref 4.0–10.5)
nRBC: 0 % (ref 0.0–0.2)

## 2019-04-02 LAB — ETHANOL: Alcohol, Ethyl (B): 161 mg/dL — ABNORMAL HIGH

## 2019-04-02 LAB — COMPREHENSIVE METABOLIC PANEL
ALT: 35 U/L (ref 0–44)
AST: 56 U/L — ABNORMAL HIGH (ref 15–41)
Albumin: 3.2 g/dL — ABNORMAL LOW (ref 3.5–5.0)
Alkaline Phosphatase: 56 U/L (ref 38–126)
Anion gap: 8 (ref 5–15)
BUN: 12 mg/dL (ref 8–23)
CO2: 22 mmol/L (ref 22–32)
Calcium: 8.4 mg/dL — ABNORMAL LOW (ref 8.9–10.3)
Chloride: 105 mmol/L (ref 98–111)
Creatinine, Ser: 1.19 mg/dL (ref 0.61–1.24)
GFR calc Af Amer: >60 mL/min (ref >60)
GFR calc non Af Amer: >60 mL/min (ref >60)
Glucose, Bld: 87 mg/dL (ref 70–99)
Potassium: 3.1 mmol/L — ABNORMAL LOW (ref 3.5–5.1)
Sodium: 135 mmol/L (ref 135–145)
Total Bilirubin: 0.8 mg/dL (ref 0.3–1.2)
Total Protein: 7.6 g/dL (ref 6.5–8.1)

## 2019-04-02 LAB — RAPID URINE DRUG SCREEN, HOSP PERFORMED
Amphetamines: NOT DETECTED
Barbiturates: NOT DETECTED
Benzodiazepines: NOT DETECTED
Cocaine: NOT DETECTED
Opiates: NOT DETECTED
Tetrahydrocannabinol: NOT DETECTED

## 2019-04-02 MED ORDER — POTASSIUM CHLORIDE CRYS ER 20 MEQ PO TBCR
40.0000 meq | EXTENDED_RELEASE_TABLET | Freq: Once | ORAL | Status: AC
  Administered 2019-04-02: 40 meq via ORAL
  Filled 2019-04-02: qty 2

## 2019-04-02 MED ORDER — POTASSIUM CHLORIDE CRYS ER 20 MEQ PO TBCR: 20.0000 meq | EXTENDED_RELEASE_TABLET | Freq: Two times a day (BID) | ORAL | 0 refills | Status: DC

## 2019-04-02 NOTE — ED Notes (Signed)
Urine and culture sent to lab  

## 2019-04-08 ENCOUNTER — Other Ambulatory Visit: Payer: Self-pay | Admitting: Infectious Disease

## 2019-04-08 DIAGNOSIS — M5431 Sciatica, right side: Secondary | ICD-10-CM

## 2019-04-10 ENCOUNTER — Emergency Department (HOSPITAL_COMMUNITY)
Admission: EM | Admit: 2019-04-10 | Discharge: 2019-04-10 | Disposition: A | Discharge status: Home / Self Care | Payer: Medicare HMO | Attending: Emergency Medicine | Admitting: Emergency Medicine

## 2019-04-10 ENCOUNTER — Encounter (HOSPITAL_COMMUNITY): Payer: Self-pay | Admitting: Emergency Medicine

## 2019-04-10 ENCOUNTER — Other Ambulatory Visit: Payer: Self-pay

## 2019-04-10 ENCOUNTER — Emergency Department (HOSPITAL_COMMUNITY): Payer: Medicare HMO

## 2019-04-10 DIAGNOSIS — B20 Human immunodeficiency virus [HIV] disease: Secondary | ICD-10-CM | POA: Insufficient documentation

## 2019-04-10 DIAGNOSIS — Z5900 Homelessness unspecified: Secondary | ICD-10-CM

## 2019-04-10 DIAGNOSIS — F1010 Alcohol abuse, uncomplicated: Secondary | ICD-10-CM

## 2019-04-10 DIAGNOSIS — F1290 Cannabis use, unspecified, uncomplicated: Secondary | ICD-10-CM | POA: Diagnosis not present

## 2019-04-10 DIAGNOSIS — F259 Schizoaffective disorder, unspecified: Secondary | ICD-10-CM | POA: Diagnosis not present

## 2019-04-10 DIAGNOSIS — F1911 Other psychoactive substance abuse, in remission: Secondary | ICD-10-CM | POA: Diagnosis not present

## 2019-04-10 DIAGNOSIS — R29810 Facial weakness: Secondary | ICD-10-CM | POA: Diagnosis present

## 2019-04-10 DIAGNOSIS — R479 Unspecified speech disturbances: Secondary | ICD-10-CM | POA: Diagnosis not present

## 2019-04-10 DIAGNOSIS — F1490 Cocaine use, unspecified, uncomplicated: Secondary | ICD-10-CM | POA: Diagnosis not present

## 2019-04-10 DIAGNOSIS — I10 Essential (primary) hypertension: Secondary | ICD-10-CM | POA: Insufficient documentation

## 2019-04-10 DIAGNOSIS — R4182 Altered mental status, unspecified: Secondary | ICD-10-CM | POA: Diagnosis not present

## 2019-04-10 DIAGNOSIS — G9341 Metabolic encephalopathy: Secondary | ICD-10-CM

## 2019-04-10 DIAGNOSIS — B182 Chronic viral hepatitis C: Secondary | ICD-10-CM | POA: Diagnosis not present

## 2019-04-10 DIAGNOSIS — F17210 Nicotine dependence, cigarettes, uncomplicated: Secondary | ICD-10-CM | POA: Insufficient documentation

## 2019-04-10 DIAGNOSIS — Z79899 Other long term (current) drug therapy: Secondary | ICD-10-CM | POA: Diagnosis not present

## 2019-04-10 LAB — CBC
HCT: 43.5 % (ref 39.0–52.0)
Hemoglobin: 14 g/dL (ref 13.0–17.0)
MCH: 32.3 pg (ref 26.0–34.0)
MCHC: 32.2 g/dL (ref 30.0–36.0)
MCV: 100.2 fL — ABNORMAL HIGH (ref 80.0–100.0)
Platelets: 101 10*3/uL — ABNORMAL LOW (ref 150–400)
RBC: 4.34 MIL/uL (ref 4.22–5.81)
RDW: 12.5 % (ref 11.5–15.5)
WBC: 4.5 10*3/uL (ref 4.0–10.5)
nRBC: 0 % (ref 0.0–0.2)

## 2019-04-10 LAB — COMPREHENSIVE METABOLIC PANEL
ALT: 39 U/L (ref 0–44)
AST: 65 U/L — ABNORMAL HIGH (ref 15–41)
Albumin: 3.1 g/dL — ABNORMAL LOW (ref 3.5–5.0)
Alkaline Phosphatase: 68 U/L (ref 38–126)
Anion gap: 10 (ref 5–15)
BUN: 11 mg/dL (ref 8–23)
CO2: 21 mmol/L — ABNORMAL LOW (ref 22–32)
Calcium: 8.9 mg/dL (ref 8.9–10.3)
Chloride: 108 mmol/L (ref 98–111)
Creatinine, Ser: 1.23 mg/dL (ref 0.61–1.24)
GFR calc Af Amer: >60 mL/min (ref >60)
GFR calc non Af Amer: >60 mL/min (ref >60)
Glucose, Bld: 108 mg/dL — ABNORMAL HIGH (ref 70–99)
Potassium: 3.7 mmol/L (ref 3.5–5.1)
Sodium: 139 mmol/L (ref 135–145)
Total Bilirubin: 0.7 mg/dL (ref 0.3–1.2)
Total Protein: 8.3 g/dL — ABNORMAL HIGH (ref 6.5–8.1)

## 2019-04-10 LAB — DIFFERENTIAL
Abs Immature Granulocytes: 0.01 10*3/uL (ref 0.00–0.07)
Basophils Absolute: 0 10*3/uL (ref 0.0–0.1)
Basophils Relative: 0 %
Eosinophils Absolute: 0.1 10*3/uL (ref 0.0–0.5)
Eosinophils Relative: 2 %
Immature Granulocytes: 0 %
Lymphocytes Relative: 19 %
Lymphs Abs: 0.9 10*3/uL (ref 0.7–4.0)
Monocytes Absolute: 0.4 10*3/uL (ref 0.1–1.0)
Monocytes Relative: 8 %
Neutro Abs: 3.1 10*3/uL (ref 1.7–7.7)
Neutrophils Relative %: 71 %

## 2019-04-10 LAB — CBG MONITORING, ED: Glucose-Capillary: 125 mg/dL — ABNORMAL HIGH (ref 70–99)

## 2019-04-10 LAB — I-STAT CHEM 8, ED
BUN: 15 mg/dL (ref 8–23)
Calcium, Ion: 1.15 mmol/L (ref 1.15–1.40)
Chloride: 106 mmol/L (ref 98–111)
Creatinine, Ser: 1.2 mg/dL (ref 0.61–1.24)
Glucose, Bld: 103 mg/dL — ABNORMAL HIGH (ref 70–99)
HCT: 46 % (ref 39.0–52.0)
Hemoglobin: 15.6 g/dL (ref 13.0–17.0)
Potassium: 3.7 mmol/L (ref 3.5–5.1)
Sodium: 144 mmol/L (ref 135–145)
TCO2: 24 mmol/L (ref 22–32)

## 2019-04-10 LAB — AMMONIA: Ammonia: 24 umol/L (ref 9–35)

## 2019-04-10 LAB — ETHANOL: Alcohol, Ethyl (B): <10 mg/dL (ref <10)

## 2019-04-10 LAB — APTT: aPTT: 34 seconds (ref 24–36)

## 2019-04-10 LAB — PROTIME-INR
INR: 1.2 (ref 0.8–1.2)
Prothrombin Time: 15.4 seconds — ABNORMAL HIGH (ref 11.4–15.2)

## 2019-04-10 MED ORDER — GADOBUTROL 1 MMOL/ML IV SOLN
10.0000 mL | Freq: Once | INTRAVENOUS | Status: AC | PRN
  Administered 2019-04-10: 10 mL via INTRAVENOUS

## 2019-04-10 MED ORDER — SODIUM CHLORIDE 0.9% FLUSH: 3.0000 mL | Freq: Once | INTRAVENOUS | Status: DC

## 2019-04-10 NOTE — ED Provider Notes (Signed)
Pt signed out by Dr. Ronnald Nian.  The pt was seen by neurology who recommended a MRI with and without contrast.    IMPRESSION:  No acute finding. 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     Pt is awake and alert.  He is ambulatory and has eaten.  He is stable for d/c.  Return if worse.  Pt given resources for alcohol abuse and homeless shelters.   Isla Pence, MD 04/10/19 [id number]

## 2019-04-10 NOTE — ED Notes (Signed)
Pt returned from MRI °

## 2019-04-10 NOTE — ED Notes (Addendum)
Pt alert and oriented x's 4.  Pt able to put his clothes on and walk to bathroom without asst.

## 2019-04-10 NOTE — Plan of Care (Signed)
MRI brain w/w/o reviewed. No acute findings, no acute infarct. Talked with RN, and pt now essentially back to baseline, no more confusion. RN will walk the pt in the hallway to see whether he walks fine.   Given no stroke on MRI and neuro exam no focal findings, no stroke work up needed. If pt back to baseline and able to walk in ER, pt can be discharged from neuro standpoint. If still has concern for encephalopathy, pt can be admitted for further work up. Low suspicious for seizure at this time. Labs and vitals unremarkable. Neurology will sign off. Please call with questions. Thanks for the consult.  Rosalin Hawking, MD PhD Stroke Neurology 04/10/2019 6:03 PM

## 2019-04-10 NOTE — ED Triage Notes (Signed)
Pt in from home via [facility] as code stroke. Per EMS, pt LSN at 0900, but case worker was present at home and noticed L mouth droop and changes in speech. Called EMS for thinking he took too many of his meds this am, as he was altered. Pt arrives with slurred speech and numbness to BLE's. CT 1 on arrival
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
## 2019-04-10 NOTE — ED Notes (Signed)
Pt to MRI at this time.

## 2019-04-11 ENCOUNTER — Other Ambulatory Visit: Payer: Self-pay | Admitting: *Deleted

## 2019-04-11 ENCOUNTER — Other Ambulatory Visit: Payer: Medicare HMO

## 2019-04-11 DIAGNOSIS — B20 Human immunodeficiency virus [HIV] disease: Secondary | ICD-10-CM

## 2019-04-12 ENCOUNTER — Other Ambulatory Visit: Payer: Self-pay

## 2019-04-12 DIAGNOSIS — Z20822 Contact with and (suspected) exposure to covid-19: Secondary | ICD-10-CM

## 2019-04-15 LAB — NOVEL CORONAVIRUS, NAA: SARS-CoV-2, NAA: NOT DETECTED

## 2019-04-23 ENCOUNTER — Emergency Department (HOSPITAL_COMMUNITY)
Admission: EM | Admit: 2019-04-23 | Discharge: 2019-04-24 | Disposition: A | Discharge status: Home / Self Care | Payer: Medicare HMO | Attending: Emergency Medicine | Admitting: Emergency Medicine

## 2019-04-23 ENCOUNTER — Other Ambulatory Visit: Payer: Self-pay

## 2019-04-23 DIAGNOSIS — F329 Major depressive disorder, single episode, unspecified: Secondary | ICD-10-CM | POA: Insufficient documentation

## 2019-04-23 DIAGNOSIS — F10920 Alcohol use, unspecified with intoxication, uncomplicated: Secondary | ICD-10-CM | POA: Insufficient documentation

## 2019-04-23 DIAGNOSIS — B20 Human immunodeficiency virus [HIV] disease: Secondary | ICD-10-CM | POA: Diagnosis not present

## 2019-04-23 DIAGNOSIS — R4182 Altered mental status, unspecified: Secondary | ICD-10-CM | POA: Diagnosis present

## 2019-04-23 DIAGNOSIS — F17210 Nicotine dependence, cigarettes, uncomplicated: Secondary | ICD-10-CM | POA: Diagnosis not present

## 2019-04-23 DIAGNOSIS — I10 Essential (primary) hypertension: Secondary | ICD-10-CM | POA: Diagnosis not present

## 2019-04-23 DIAGNOSIS — J449 Chronic obstructive pulmonary disease, unspecified: Secondary | ICD-10-CM | POA: Insufficient documentation

## 2019-04-23 MED ORDER — ZIPRASIDONE MESYLATE 20 MG IM SOLR
10.0000 mg | Freq: Once | INTRAMUSCULAR | Status: AC
  Administered 2019-04-23: 19:00:00 10 mg via INTRAMUSCULAR
  Filled 2019-04-23: qty 20

## 2019-04-23 MED ORDER — STERILE WATER FOR INJECTION IJ SOLN
INTRAMUSCULAR | Status: AC
  Filled 2019-04-23: qty 10

## 2019-04-23 MED ORDER — DIPHENHYDRAMINE HCL 50 MG/ML IJ SOLN
50.0000 mg | Freq: Once | INTRAMUSCULAR | Status: AC
  Administered 2019-04-23: 50 mg via INTRAMUSCULAR
  Filled 2019-04-23: qty 1
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
## 2019-04-24 NOTE — ED Provider Notes (Signed)
6:24 AM Assumed care from Dr. Laverta Baltimore, please see their note for full history, physical and decision making until this point. In brief this is a 63 y.o. year old male who presented to the ED tonight with Alcohol Intoxication  Intoxicated and vague suicidal ideation. Pending reeval.   Now is clinically sober. Feels better. No long suicidal. Wants sandwich. Stable for discharge.   Discharge instructions, including strict return precautions for new or worsening symptoms, given. Patient and/or family verbalized understanding and agreement with the plan as described.   Labs, studies and imaging reviewed by myself and considered in medical decision making if ordered. Imaging interpreted by radiology.  Labs Reviewed - No data to display  No orders to display    No follow-ups on file.    Kiauna Zywicki, Corene Cornea, MD 04/24/19 [phone]

## 2019-04-24 NOTE — ED Notes (Signed)
Ambulatory in room, constantly requesting food. 2 trays given plus 2 sandwiches.

## 2019-05-01 ENCOUNTER — Encounter: Payer: Medicare HMO | Admitting: Infectious Disease

## 2019-05-18 ENCOUNTER — Emergency Department (HOSPITAL_COMMUNITY)
Admission: EM | Admit: 2019-05-18 | Discharge: 2019-05-18 | Disposition: A | Discharge status: Home / Self Care | Payer: Medicare HMO | Attending: Emergency Medicine | Admitting: Emergency Medicine

## 2019-05-18 ENCOUNTER — Other Ambulatory Visit: Payer: Self-pay

## 2019-05-18 ENCOUNTER — Emergency Department (HOSPITAL_COMMUNITY): Payer: Medicare HMO

## 2019-05-18 DIAGNOSIS — J449 Chronic obstructive pulmonary disease, unspecified: Secondary | ICD-10-CM | POA: Insufficient documentation

## 2019-05-18 DIAGNOSIS — F1410 Cocaine abuse, uncomplicated: Secondary | ICD-10-CM | POA: Diagnosis not present

## 2019-05-18 DIAGNOSIS — R0789 Other chest pain: Secondary | ICD-10-CM | POA: Insufficient documentation

## 2019-05-18 DIAGNOSIS — Z79899 Other long term (current) drug therapy: Secondary | ICD-10-CM | POA: Insufficient documentation

## 2019-05-18 DIAGNOSIS — I10 Essential (primary) hypertension: Secondary | ICD-10-CM | POA: Insufficient documentation

## 2019-05-18 DIAGNOSIS — F10920 Alcohol use, unspecified with intoxication, uncomplicated: Secondary | ICD-10-CM | POA: Diagnosis present

## 2019-05-18 DIAGNOSIS — F17210 Nicotine dependence, cigarettes, uncomplicated: Secondary | ICD-10-CM | POA: Insufficient documentation

## 2019-05-18 DIAGNOSIS — F1210 Cannabis abuse, uncomplicated: Secondary | ICD-10-CM | POA: Diagnosis not present

## 2019-05-18 DIAGNOSIS — B20 Human immunodeficiency virus [HIV] disease: Secondary | ICD-10-CM | POA: Insufficient documentation

## 2019-05-18 DIAGNOSIS — R079 Chest pain, unspecified: Secondary | ICD-10-CM

## 2019-05-18 LAB — CBC
HCT: 44.8 % (ref 39.0–52.0)
Hemoglobin: 14.5 g/dL (ref 13.0–17.0)
MCH: 32.1 pg (ref 26.0–34.0)
MCHC: 32.4 g/dL (ref 30.0–36.0)
MCV: 99.1 fL (ref 80.0–100.0)
Platelets: 122 10*3/uL — ABNORMAL LOW (ref 150–400)
RBC: 4.52 MIL/uL (ref 4.22–5.81)
RDW: 12.4 % (ref 11.5–15.5)
WBC: 7.4 10*3/uL (ref 4.0–10.5)
nRBC: 0 % (ref 0.0–0.2)

## 2019-05-18 LAB — BASIC METABOLIC PANEL
Anion gap: 6 (ref 5–15)
BUN: 9 mg/dL (ref 8–23)
CO2: 23 mmol/L (ref 22–32)
Calcium: 7.5 mg/dL — ABNORMAL LOW (ref 8.9–10.3)
Chloride: 110 mmol/L (ref 98–111)
Creatinine, Ser: 0.73 mg/dL (ref 0.61–1.24)
GFR calc Af Amer: >60 mL/min (ref >60)
GFR calc non Af Amer: >60 mL/min (ref >60)
Glucose, Bld: 88 mg/dL (ref 70–99)
Potassium: 3.1 mmol/L — ABNORMAL LOW (ref 3.5–5.1)
Sodium: 139 mmol/L (ref 135–145)

## 2019-05-18 LAB — TROPONIN I (HIGH SENSITIVITY)
Troponin I (High Sensitivity): 18 ng/L — ABNORMAL HIGH (ref <18)
Troponin I (High Sensitivity): 17 ng/L (ref <18)

## 2019-05-18 LAB — ETHANOL: Alcohol, Ethyl (B): 238 mg/dL — ABNORMAL HIGH (ref <10)

## 2019-05-18 NOTE — ED Triage Notes (Signed)
Patient found sitting on sidewalk outside, EMS called. Patient seen here frequently for ETOH. EMS reports patient refused vital signs and was verbally aggressive/beligerent.

## 2019-05-18 NOTE — ED Notes (Signed)
Discharge paper reviewed Pt A/oX4 and ambulatory at this time.

## 2019-05-18 NOTE — Discharge Instructions (Addendum)
Avoid drinking alcohol.  Please consider following up with and outpatient treatment center.
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
## 2019-06-03 ENCOUNTER — Ambulatory Visit: Payer: Medicare HMO | Admitting: Infectious Disease

## 2019-06-06 ENCOUNTER — Other Ambulatory Visit: Payer: Self-pay

## 2019-06-06 ENCOUNTER — Emergency Department (HOSPITAL_COMMUNITY)
Admission: EM | Admit: 2019-06-06 | Discharge: 2019-06-07 | Disposition: A | Discharge status: Home / Self Care | Payer: Medicare HMO | Attending: Emergency Medicine | Admitting: Emergency Medicine

## 2019-06-06 ENCOUNTER — Encounter (HOSPITAL_COMMUNITY): Payer: Self-pay | Admitting: Emergency Medicine

## 2019-06-06 ENCOUNTER — Emergency Department (HOSPITAL_COMMUNITY): Payer: Medicare HMO

## 2019-06-06 DIAGNOSIS — F1490 Cocaine use, unspecified, uncomplicated: Secondary | ICD-10-CM | POA: Insufficient documentation

## 2019-06-06 DIAGNOSIS — B182 Chronic viral hepatitis C: Secondary | ICD-10-CM | POA: Diagnosis not present

## 2019-06-06 DIAGNOSIS — R0789 Other chest pain: Secondary | ICD-10-CM | POA: Diagnosis present

## 2019-06-06 DIAGNOSIS — F10920 Alcohol use, unspecified with intoxication, uncomplicated: Secondary | ICD-10-CM | POA: Diagnosis not present

## 2019-06-06 DIAGNOSIS — F17210 Nicotine dependence, cigarettes, uncomplicated: Secondary | ICD-10-CM | POA: Diagnosis not present

## 2019-06-06 DIAGNOSIS — J449 Chronic obstructive pulmonary disease, unspecified: Secondary | ICD-10-CM | POA: Diagnosis not present

## 2019-06-06 DIAGNOSIS — B20 Human immunodeficiency virus [HIV] disease: Secondary | ICD-10-CM | POA: Insufficient documentation

## 2019-06-06 DIAGNOSIS — F1290 Cannabis use, unspecified, uncomplicated: Secondary | ICD-10-CM | POA: Insufficient documentation

## 2019-06-06 LAB — CBC
HCT: 44.3 % (ref 39.0–52.0)
Hemoglobin: 14.3 g/dL (ref 13.0–17.0)
MCH: 31.8 pg (ref 26.0–34.0)
MCHC: 32.3 g/dL (ref 30.0–36.0)
MCV: 98.4 fL (ref 80.0–100.0)
Platelets: 122 10*3/uL — ABNORMAL LOW (ref 150–400)
RBC: 4.5 MIL/uL (ref 4.22–5.81)
RDW: 13.8 % (ref 11.5–15.5)
WBC: 5.7 10*3/uL (ref 4.0–10.5)
nRBC: 0 % (ref 0.0–0.2)

## 2019-06-06 MED ORDER — SODIUM CHLORIDE 0.9% FLUSH: 3.0000 mL | Freq: Once | INTRAVENOUS | Status: DC
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
## 2019-06-06 NOTE — ED Triage Notes (Addendum)
Pt to ED via [facility] from [employer] pt admits to a lot of ETOH   Pt yelling and cursing in triage  Pt c/o chest pain x's 3 days.  EMS gave pt ASA 324mg 

## 2019-06-06 NOTE — ED Notes (Signed)
Pt behaving boisterous and shouting at staff. Provided with a sandwich and snacks; pt moved to Bed and instructed to rest.

## 2019-06-07 LAB — BASIC METABOLIC PANEL
Anion gap: 10 (ref 5–15)
BUN: 10 mg/dL (ref 8–23)
CO2: 22 mmol/L (ref 22–32)
Calcium: 9.4 mg/dL (ref 8.9–10.3)
Chloride: 101 mmol/L (ref 98–111)
Creatinine, Ser: 1.04 mg/dL (ref 0.61–1.24)
GFR calc Af Amer: >60 mL/min (ref >60)
GFR calc non Af Amer: >60 mL/min (ref >60)
Glucose, Bld: 104 mg/dL — ABNORMAL HIGH (ref 70–99)
Potassium: 3.8 mmol/L (ref 3.5–5.1)
Sodium: 133 mmol/L — ABNORMAL LOW (ref 135–145)

## 2019-06-07 LAB — TROPONIN I (HIGH SENSITIVITY): Troponin I (High Sensitivity): 12 ng/L (ref <18)

## 2019-06-07 NOTE — ED Notes (Signed)
Pt redirected back to his bed after pt began wandering around nurses station.

## 2019-06-07 NOTE — ED Notes (Signed)
Mr Muscatello intermittently gets up, walks around, talks about his "bitch", requests cheese and crackers, states looking for his money, etc. Pt redirected back to the bed.

## 2019-06-07 NOTE — ED Notes (Signed)
Pt is sleeping, rise and fall of chest visible.

## 2019-06-12 ENCOUNTER — Ambulatory Visit: Payer: Medicare HMO | Admitting: Infectious Disease

## 2019-06-13 ENCOUNTER — Emergency Department (HOSPITAL_COMMUNITY)
Admission: EM | Admit: 2019-06-13 | Discharge: 2019-06-13 | Disposition: A | Discharge status: Home / Self Care | Payer: Medicare HMO | Attending: Emergency Medicine | Admitting: Emergency Medicine

## 2019-06-13 ENCOUNTER — Encounter (HOSPITAL_COMMUNITY): Payer: Self-pay | Admitting: Emergency Medicine

## 2019-06-13 DIAGNOSIS — F10920 Alcohol use, unspecified with intoxication, uncomplicated: Secondary | ICD-10-CM | POA: Diagnosis present

## 2019-06-13 DIAGNOSIS — Z5321 Procedure and treatment not carried out due to patient leaving prior to being seen by health care provider: Secondary | ICD-10-CM | POA: Diagnosis not present
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
## 2019-06-13 NOTE — ED Notes (Signed)
Called for Pt but no answer. Do not see Pt either

## 2019-07-04 ENCOUNTER — Other Ambulatory Visit: Payer: Self-pay

## 2019-07-04 ENCOUNTER — Encounter (HOSPITAL_COMMUNITY): Payer: Self-pay

## 2019-07-04 ENCOUNTER — Emergency Department (HOSPITAL_COMMUNITY)
Admission: EM | Admit: 2019-07-04 | Discharge: 2019-07-04 | Disposition: A | Discharge status: Home / Self Care | Payer: Medicare HMO | Attending: Emergency Medicine | Admitting: Emergency Medicine

## 2019-07-04 DIAGNOSIS — Z590 Homelessness: Secondary | ICD-10-CM | POA: Insufficient documentation

## 2019-07-04 DIAGNOSIS — B20 Human immunodeficiency virus [HIV] disease: Secondary | ICD-10-CM | POA: Insufficient documentation

## 2019-07-04 DIAGNOSIS — Z20822 Contact with and (suspected) exposure to covid-19: Secondary | ICD-10-CM | POA: Insufficient documentation

## 2019-07-04 DIAGNOSIS — F1010 Alcohol abuse, uncomplicated: Secondary | ICD-10-CM | POA: Insufficient documentation

## 2019-07-04 DIAGNOSIS — Z760 Encounter for issue of repeat prescription: Secondary | ICD-10-CM | POA: Insufficient documentation

## 2019-07-04 DIAGNOSIS — F17210 Nicotine dependence, cigarettes, uncomplicated: Secondary | ICD-10-CM | POA: Diagnosis not present

## 2019-07-04 LAB — RESPIRATORY PANEL BY RT PCR (FLU A&B, COVID)
Influenza A by PCR: NEGATIVE
Influenza B by PCR: NEGATIVE
SARS Coronavirus 2 by RT PCR: NEGATIVE

## 2019-07-04 LAB — CBG MONITORING, ED: Glucose-Capillary: 128 mg/dL — ABNORMAL HIGH (ref 70–99)

## 2019-07-04 MED ORDER — RISPERIDONE 3 MG PO TABS: 3.0000 mg | ORAL_TABLET | Freq: Every day | ORAL | 0 refills | Status: DC

## 2019-07-04 MED ORDER — RISPERIDONE 2 MG PO TABS
3.0000 mg | ORAL_TABLET | Freq: Once | ORAL | Status: AC
  Administered 2019-07-04: 3 mg via ORAL
  Filled 2019-07-04: qty 1

## 2019-07-04 MED ORDER — BIKTARVY 50-200-25 MG PO TABS: 1.0000 | ORAL_TABLET | Freq: Every day | ORAL | 0 refills | Status: DC

## 2019-07-04 NOTE — ED Triage Notes (Signed)
Pt BIB EMS. Pt reports running out of all medications. Pt has hx of bipolar disorder. Pt continuously talking and hollering out.

## 2019-07-04 NOTE — TOC Initial Note (Addendum)
Transition of Care [facility]) - Initial/Assessment Note    Patient Details  Name: [name] MRN: [id number] Date of Birth: [birth year]  Transition of Care [facility]) CM/SW Contact:    Elliot Cousin, RN Phone Number: [phone] 07/04/2019, 3:12 PM  Clinical Narrative:                  TOC CM spoke to pt at bedside. States he is homeless. He was living with his girlfriend about 6 months ago but she put him out. Gave permission to speak to sister. States he is out of medications. Goes to [facility] and [facility]. He is not sure when he had his last appt. Pt states he uses Pharmacologist. Gave permission to call pharmacy to follow up on meds. Pt picked up all his Rx on 06/14/2019 and will not be able to have filled again until 07/11/2019. ED provider updated.  Call made to [facility] for shelter availability. Left HIPAA message for return call.    Expected Discharge Plan: Homeless Shelter Barriers to Discharge: Continued Medical Work up   Patient Goals and CMS Choice        Expected Discharge Plan and Services Expected Discharge Plan: Homeless Shelter In-house Referral: Clinical Social Work Discharge Planning Services: CM Consult   Living arrangements for the past 2 months: Homeless Shelter                                      Prior Living Arrangements/Services Living arrangements for the past 2 months: Homeless Shelter Lives with:: Self Patient language and need for interpreter reviewed:: Yes Do you feel safe going back to the place where you live?: Yes      Need for Family Participation in Patient Care: No (Comment) Care giver support system in place?: No (comment)   Criminal Activity/Legal Involvement Pertinent to Current Situation/Hospitalization: Yes - Comment as needed  Activities of Daily Living      Permission Sought/Granted Permission sought to share information with : Case Manager                Emotional  Assessment Appearance:: Appears older than stated age Attitude/Demeanor/Rapport: Guarded Affect (typically observed): Irritable Orientation: : Oriented to Self, Oriented to Place, Oriented to  Time, Oriented to Situation Alcohol / Substance Use: Tobacco Use, Alcohol Use Psych Involvement: No (comment)  Admission diagnosis:  out of psych medicaiton Patient Active Problem List   Diagnosis Date Noted  . Acute hypoxemic respiratory failure (HCC) 11/15/2016  . Polysubstance abuse (HCC) 11/15/2016  . Polysubstance (excluding opioids) dependence, daily use (HCC) 10/26/2016  . Cocaine abuse (HCC) 10/26/2016  . Elevated troponin 04/10/2016  . Chest pain 04/09/2016  . Alcohol intoxication (HCC) 04/09/2016  . Drug abuse (HCC) 04/09/2016  . 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 AIDS (HCC) 01/20/2015  . Chronic hepatitis C without hepatic coma (HCC) 09/29/2014  . Sciatica 09/29/2014  . Major depression, recurrent (HCC) 09/29/2014  . Schizoaffective disorder (HCC) 04/26/2013  . Erectile dysfunction 09/21/2011  . MRSA bacteremia 09/21/2011  . 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 Human immunodeficiency virus (HIV) disease (HCC) 05/30/2006  . Chronic hepatitis C virus infection (HCC) 05/30/2006  . G6PD deficiency (HCC) 05/30/2006  . 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 TOBACCO ABUSE 05/30/2006  . HEADACHE, TENSION 05/30/2006  . Essential hypertension 05/30/2006  . EMPHYSEMA 05/30/2006  .  Asthma 05/30/2006  . Blood per rectum 05/30/2006   PCP:  System, Pcp Not In Pharmacy:   [facility] [place], [place] - [address] [place] [address] Phone: [phone] Fax: [phone]     Social Determinants of Health (SDOH) Interventions    Readmission Risk Interventions No flowsheet data found.

## 2019-07-04 NOTE — Discharge Instructions (Signed)
Occasions with the available for pickup at your usual pharmacy 2/14.
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
## 2019-07-04 NOTE — ED Notes (Signed)
Pt states he is ready to leave and has already arranged himself a place to stay for the night. Pt was provided with a bus pass by social work. [place], PA was notified that pt is requesting to leave. PA is at the bedside reviewing prescriptions with pts and the importance of not taking more medications than prescribed.

## 2019-07-04 NOTE — Progress Notes (Signed)
CSW handed pt a phone to speak to [place] with [facility] at ph: [phone] who completed an assessment for the [facility] and who stated he would contact pt if a shelter bed came open.  Pt then stated he was, "going home" and asked for a bus pass.  Pt later said he was going to his sister's house to await a phone call from [facility].  CSW will continue to follow for D/C needs.  Dorothe Pea. Demone Lyles, LCSW, LCAS, CSI Transitions of Care Clinical Social Worker Care Coordination Department Ph: [phone]

## 2019-07-10 ENCOUNTER — Other Ambulatory Visit: Payer: Self-pay

## 2019-07-10 ENCOUNTER — Emergency Department (HOSPITAL_COMMUNITY)
Admission: EM | Admit: 2019-07-10 | Discharge: 2019-07-10 | Disposition: A | Discharge status: Home / Self Care | Payer: Medicare HMO | Attending: Emergency Medicine | Admitting: Emergency Medicine

## 2019-07-10 ENCOUNTER — Encounter (HOSPITAL_COMMUNITY): Payer: Self-pay

## 2019-07-10 DIAGNOSIS — F17210 Nicotine dependence, cigarettes, uncomplicated: Secondary | ICD-10-CM | POA: Diagnosis not present

## 2019-07-10 DIAGNOSIS — B182 Chronic viral hepatitis C: Secondary | ICD-10-CM | POA: Diagnosis not present

## 2019-07-10 DIAGNOSIS — B20 Human immunodeficiency virus [HIV] disease: Secondary | ICD-10-CM | POA: Insufficient documentation

## 2019-07-10 DIAGNOSIS — Z760 Encounter for issue of repeat prescription: Secondary | ICD-10-CM

## 2019-07-10 DIAGNOSIS — J449 Chronic obstructive pulmonary disease, unspecified: Secondary | ICD-10-CM | POA: Diagnosis not present

## 2019-07-10 MED ORDER — RISPERIDONE 3 MG PO TABS
3.0000 mg | ORAL_TABLET | Freq: Once | ORAL | Status: AC
  Administered 2019-07-10: 3 mg via ORAL
  Filled 2019-07-10: qty 1

## 2019-07-10 NOTE — Discharge Instructions (Signed)
You can get your prescriptions filled at the [facility] outpatient pharmacy tomorrow. Return to the ED if you start to experience chest pain, shortness of breath, blurry vision, numbness in arms or legs.

## 2019-07-10 NOTE — ED Triage Notes (Signed)
Pt BIB [facility] for eval of pain all over. Pt is well known to this ED, appears more sober than typically presentation. Pt denies acute complaints

## 2019-07-10 NOTE — ED Notes (Signed)
Patient verbalizes understanding of discharge instructions. Opportunity for questioning and answers were provided. Armband removed by staff, pt discharged from ED.  
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
## 2019-07-15 ENCOUNTER — Other Ambulatory Visit: Payer: Self-pay | Admitting: Infectious Disease

## 2019-07-15 DIAGNOSIS — M5431 Sciatica, right side: Secondary | ICD-10-CM

## 2019-07-24 ENCOUNTER — Ambulatory Visit: Payer: Medicare HMO | Admitting: Infectious Disease

## 2019-08-04 ENCOUNTER — Emergency Department (HOSPITAL_COMMUNITY)
Admission: EM | Admit: 2019-08-04 | Discharge: 2019-08-04 | Disposition: A | Discharge status: Home / Self Care | Payer: Medicare HMO | Attending: Emergency Medicine | Admitting: Emergency Medicine

## 2019-08-04 ENCOUNTER — Encounter (HOSPITAL_COMMUNITY): Payer: Self-pay | Admitting: Emergency Medicine

## 2019-08-04 DIAGNOSIS — B20 Human immunodeficiency virus [HIV] disease: Secondary | ICD-10-CM | POA: Diagnosis not present

## 2019-08-04 DIAGNOSIS — F17210 Nicotine dependence, cigarettes, uncomplicated: Secondary | ICD-10-CM | POA: Insufficient documentation

## 2019-08-04 DIAGNOSIS — J449 Chronic obstructive pulmonary disease, unspecified: Secondary | ICD-10-CM | POA: Insufficient documentation

## 2019-08-04 DIAGNOSIS — Z760 Encounter for issue of repeat prescription: Secondary | ICD-10-CM | POA: Insufficient documentation

## 2019-08-04 DIAGNOSIS — J45909 Unspecified asthma, uncomplicated: Secondary | ICD-10-CM | POA: Diagnosis not present

## 2019-08-04 DIAGNOSIS — Z79899 Other long term (current) drug therapy: Secondary | ICD-10-CM | POA: Diagnosis not present

## 2019-08-04 DIAGNOSIS — I10 Essential (primary) hypertension: Secondary | ICD-10-CM | POA: Diagnosis not present

## 2019-08-04 DIAGNOSIS — M5431 Sciatica, right side: Secondary | ICD-10-CM

## 2019-08-04 MED ORDER — GABAPENTIN 100 MG PO CAPS: 100.0000 mg | ORAL_CAPSULE | Freq: Three times a day (TID) | ORAL | 1 refills | Status: AC

## 2019-08-04 MED ORDER — BIKTARVY 50-200-25 MG PO TABS: 1.0000 | ORAL_TABLET | Freq: Every day | ORAL | 0 refills | Status: AC

## 2019-08-04 MED ORDER — RISPERIDONE 3 MG PO TABS: 3.0000 mg | ORAL_TABLET | Freq: Every day | ORAL | 0 refills | Status: AC
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
## 2019-08-04 NOTE — Discharge Instructions (Addendum)
I have sent a refill to your pharmacy.  Follow up with a primary care doctor

## 2019-08-04 NOTE — ED Triage Notes (Signed)
Per EMS-states pain all over due to his neuropathy-off meds for 3 days

## 2019-08-11 ENCOUNTER — Emergency Department (HOSPITAL_COMMUNITY)
Admission: EM | Admit: 2019-08-11 | Discharge: 2019-08-11 | Disposition: A | Discharge status: Home / Self Care | Payer: Medicare HMO | Attending: Emergency Medicine | Admitting: Emergency Medicine

## 2019-08-11 ENCOUNTER — Other Ambulatory Visit: Payer: Self-pay

## 2019-08-11 ENCOUNTER — Encounter (HOSPITAL_COMMUNITY): Payer: Self-pay | Admitting: Emergency Medicine

## 2019-08-11 DIAGNOSIS — R0789 Other chest pain: Secondary | ICD-10-CM | POA: Diagnosis not present

## 2019-08-11 DIAGNOSIS — Z5321 Procedure and treatment not carried out due to patient leaving prior to being seen by health care provider: Secondary | ICD-10-CM | POA: Insufficient documentation

## 2019-08-11 LAB — TROPONIN I (HIGH SENSITIVITY): Troponin I (High Sensitivity): 18 ng/L — ABNORMAL HIGH (ref <18)

## 2019-08-11 LAB — BASIC METABOLIC PANEL
Anion gap: 9 (ref 5–15)
BUN: 11 mg/dL (ref 8–23)
CO2: 25 mmol/L (ref 22–32)
Calcium: 9.2 mg/dL (ref 8.9–10.3)
Chloride: 100 mmol/L (ref 98–111)
Creatinine, Ser: 0.87 mg/dL (ref 0.61–1.24)
GFR calc Af Amer: >60 mL/min (ref >60)
GFR calc non Af Amer: >60 mL/min (ref >60)
Glucose, Bld: 86 mg/dL (ref 70–99)
Potassium: 4 mmol/L (ref 3.5–5.1)
Sodium: 134 mmol/L — ABNORMAL LOW (ref 135–145)

## 2019-08-11 LAB — CBC
HCT: 44.3 % (ref 39.0–52.0)
Hemoglobin: 14.6 g/dL (ref 13.0–17.0)
MCH: 33.1 pg (ref 26.0–34.0)
MCHC: 33 g/dL (ref 30.0–36.0)
MCV: 100.5 fL — ABNORMAL HIGH (ref 80.0–100.0)
Platelets: 121 10*3/uL — ABNORMAL LOW (ref 150–400)
RBC: 4.41 MIL/uL (ref 4.22–5.81)
RDW: 13.4 % (ref 11.5–15.5)
WBC: 5.1 10*3/uL (ref 4.0–10.5)
nRBC: 0 % (ref 0.0–0.2)
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
## 2019-08-12 ENCOUNTER — Ambulatory Visit: Payer: Medicare HMO | Admitting: Infectious Disease

## 2019-09-12 ENCOUNTER — Emergency Department (HOSPITAL_COMMUNITY)
Admission: EM | Admit: 2019-09-12 | Discharge: 2019-09-13 | Disposition: A | Discharge status: Home / Self Care | Payer: Medicare HMO | Attending: Emergency Medicine | Admitting: Emergency Medicine

## 2019-09-12 ENCOUNTER — Encounter (HOSPITAL_COMMUNITY): Payer: Self-pay | Admitting: Emergency Medicine

## 2019-09-12 ENCOUNTER — Other Ambulatory Visit: Payer: Self-pay

## 2019-09-12 DIAGNOSIS — F10920 Alcohol use, unspecified with intoxication, uncomplicated: Secondary | ICD-10-CM | POA: Insufficient documentation

## 2019-09-12 DIAGNOSIS — R519 Headache, unspecified: Secondary | ICD-10-CM | POA: Diagnosis not present

## 2019-09-12 DIAGNOSIS — Y9389 Activity, other specified: Secondary | ICD-10-CM | POA: Insufficient documentation

## 2019-09-12 DIAGNOSIS — J449 Chronic obstructive pulmonary disease, unspecified: Secondary | ICD-10-CM | POA: Diagnosis not present

## 2019-09-12 DIAGNOSIS — B20 Human immunodeficiency virus [HIV] disease: Secondary | ICD-10-CM | POA: Diagnosis not present

## 2019-09-12 DIAGNOSIS — W0110XA Fall on same level from slipping, tripping and stumbling with subsequent striking against unspecified object, initial encounter: Secondary | ICD-10-CM | POA: Diagnosis not present

## 2019-09-12 DIAGNOSIS — Y929 Unspecified place or not applicable: Secondary | ICD-10-CM | POA: Insufficient documentation

## 2019-09-12 DIAGNOSIS — Y999 Unspecified external cause status: Secondary | ICD-10-CM | POA: Diagnosis not present

## 2019-09-12 DIAGNOSIS — Z79899 Other long term (current) drug therapy: Secondary | ICD-10-CM | POA: Insufficient documentation

## 2019-09-12 DIAGNOSIS — Z5900 Homelessness unspecified: Secondary | ICD-10-CM

## 2019-09-12 DIAGNOSIS — F17210 Nicotine dependence, cigarettes, uncomplicated: Secondary | ICD-10-CM | POA: Insufficient documentation

## 2019-09-12 DIAGNOSIS — Z7141 Alcohol abuse counseling and surveillance of alcoholic: Secondary | ICD-10-CM | POA: Diagnosis not present

## 2019-09-12 DIAGNOSIS — Y906 Blood alcohol level of 120-199 mg/100 ml: Secondary | ICD-10-CM | POA: Diagnosis not present

## 2019-09-12 DIAGNOSIS — I10 Essential (primary) hypertension: Secondary | ICD-10-CM | POA: Diagnosis not present

## 2019-09-12 DIAGNOSIS — R002 Palpitations: Secondary | ICD-10-CM | POA: Insufficient documentation
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
## 2019-09-13 ENCOUNTER — Emergency Department (HOSPITAL_COMMUNITY): Payer: Medicare HMO

## 2019-09-13 ENCOUNTER — Encounter (HOSPITAL_COMMUNITY): Payer: Self-pay | Admitting: Radiology

## 2019-09-13 DIAGNOSIS — Z590 Homelessness: Secondary | ICD-10-CM | POA: Diagnosis not present

## 2019-09-13 LAB — COMPREHENSIVE METABOLIC PANEL
ALT: 54 U/L — ABNORMAL HIGH (ref 0–44)
AST: 112 U/L — ABNORMAL HIGH (ref 15–41)
Albumin: 2.9 g/dL — ABNORMAL LOW (ref 3.5–5.0)
Alkaline Phosphatase: 85 U/L (ref 38–126)
Anion gap: 9 (ref 5–15)
BUN: 11 mg/dL (ref 8–23)
CO2: 27 mmol/L (ref 22–32)
Calcium: 9.1 mg/dL (ref 8.9–10.3)
Chloride: 100 mmol/L (ref 98–111)
Creatinine, Ser: 1.11 mg/dL (ref 0.61–1.24)
GFR calc Af Amer: >60 mL/min (ref >60)
GFR calc non Af Amer: >60 mL/min (ref >60)
Glucose, Bld: 109 mg/dL — ABNORMAL HIGH (ref 70–99)
Potassium: 4.7 mmol/L (ref 3.5–5.1)
Sodium: 136 mmol/L (ref 135–145)
Total Bilirubin: 1.5 mg/dL — ABNORMAL HIGH (ref 0.3–1.2)
Total Protein: 8.6 g/dL — ABNORMAL HIGH (ref 6.5–8.1)

## 2019-09-13 LAB — RAPID URINE DRUG SCREEN, HOSP PERFORMED
Amphetamines: NOT DETECTED
Barbiturates: NOT DETECTED
Benzodiazepines: NOT DETECTED
Cocaine: NOT DETECTED
Opiates: NOT DETECTED
Tetrahydrocannabinol: NOT DETECTED

## 2019-09-13 LAB — CBC
HCT: 41.6 % (ref 39.0–52.0)
Hemoglobin: 13.9 g/dL (ref 13.0–17.0)
MCH: 33.1 pg (ref 26.0–34.0)
MCHC: 33.4 g/dL (ref 30.0–36.0)
MCV: 99 fL (ref 80.0–100.0)
Platelets: 122 10*3/uL — ABNORMAL LOW (ref 150–400)
RBC: 4.2 MIL/uL — ABNORMAL LOW (ref 4.22–5.81)
RDW: 14.6 % (ref 11.5–15.5)
WBC: 5.1 10*3/uL (ref 4.0–10.5)
nRBC: 0 % (ref 0.0–0.2)

## 2019-09-13 LAB — ETHANOL: Alcohol, Ethyl (B): 134 mg/dL — ABNORMAL HIGH (ref <10)

## 2019-09-13 NOTE — ED Notes (Signed)
Pt was placed in a hall bed and when pt was in taken to CT, a bed bug was found. CT contacted [facility], pt was taken to decon room, cleaned and bed cleaned and placed back in hallway.
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
## 2019-09-18 ENCOUNTER — Other Ambulatory Visit: Payer: Self-pay

## 2019-09-19 ENCOUNTER — Ambulatory Visit: Payer: Medicare HMO

## 2019-09-22 ENCOUNTER — Emergency Department (HOSPITAL_COMMUNITY)
Admission: EM | Admit: 2019-09-22 | Discharge: 2019-09-22 | Disposition: A | Discharge status: Home / Self Care | Payer: Medicare HMO | Attending: Emergency Medicine | Admitting: Emergency Medicine

## 2019-09-22 ENCOUNTER — Other Ambulatory Visit: Payer: Self-pay

## 2019-09-22 ENCOUNTER — Emergency Department (HOSPITAL_COMMUNITY): Payer: Medicare HMO

## 2019-09-22 ENCOUNTER — Encounter (HOSPITAL_COMMUNITY): Payer: Self-pay

## 2019-09-22 DIAGNOSIS — I10 Essential (primary) hypertension: Secondary | ICD-10-CM | POA: Diagnosis not present

## 2019-09-22 DIAGNOSIS — J449 Chronic obstructive pulmonary disease, unspecified: Secondary | ICD-10-CM | POA: Insufficient documentation

## 2019-09-22 DIAGNOSIS — F17210 Nicotine dependence, cigarettes, uncomplicated: Secondary | ICD-10-CM | POA: Insufficient documentation

## 2019-09-22 DIAGNOSIS — Z79899 Other long term (current) drug therapy: Secondary | ICD-10-CM | POA: Insufficient documentation

## 2019-09-22 DIAGNOSIS — R0789 Other chest pain: Secondary | ICD-10-CM | POA: Diagnosis not present

## 2019-09-22 DIAGNOSIS — B20 Human immunodeficiency virus [HIV] disease: Secondary | ICD-10-CM | POA: Insufficient documentation

## 2019-09-22 DIAGNOSIS — R079 Chest pain, unspecified: Secondary | ICD-10-CM

## 2019-09-22 LAB — TROPONIN I (HIGH SENSITIVITY)
Troponin I (High Sensitivity): 18 ng/L — ABNORMAL HIGH (ref <18)
Troponin I (High Sensitivity): 19 ng/L — ABNORMAL HIGH (ref <18)
Troponin I (High Sensitivity): 19 ng/L — ABNORMAL HIGH (ref <18)

## 2019-09-22 LAB — CBC
HCT: 38.3 % — ABNORMAL LOW (ref 39.0–52.0)
Hemoglobin: 12.3 g/dL — ABNORMAL LOW (ref 13.0–17.0)
MCH: 33 pg (ref 26.0–34.0)
MCHC: 32.1 g/dL (ref 30.0–36.0)
MCV: 102.7 fL — ABNORMAL HIGH (ref 80.0–100.0)
Platelets: 95 10*3/uL — ABNORMAL LOW (ref 150–400)
RBC: 3.73 MIL/uL — ABNORMAL LOW (ref 4.22–5.81)
RDW: 14.2 % (ref 11.5–15.5)
WBC: 4.2 10*3/uL (ref 4.0–10.5)
nRBC: 0 % (ref 0.0–0.2)

## 2019-09-22 LAB — RAPID URINE DRUG SCREEN, HOSP PERFORMED
Amphetamines: NOT DETECTED
Barbiturates: NOT DETECTED
Benzodiazepines: NOT DETECTED
Cocaine: POSITIVE — AB
Opiates: NOT DETECTED
Tetrahydrocannabinol: POSITIVE — AB

## 2019-09-22 LAB — BASIC METABOLIC PANEL
Anion gap: 9 (ref 5–15)
BUN: 6 mg/dL — ABNORMAL LOW (ref 8–23)
CO2: 22 mmol/L (ref 22–32)
Calcium: 8.5 mg/dL — ABNORMAL LOW (ref 8.9–10.3)
Chloride: 106 mmol/L (ref 98–111)
Creatinine, Ser: 0.94 mg/dL (ref 0.61–1.24)
GFR calc Af Amer: >60 mL/min (ref >60)
GFR calc non Af Amer: >60 mL/min (ref >60)
Glucose, Bld: 109 mg/dL — ABNORMAL HIGH (ref 70–99)
Potassium: 3.7 mmol/L (ref 3.5–5.1)
Sodium: 137 mmol/L (ref 135–145)

## 2019-09-22 LAB — ETHANOL: Alcohol, Ethyl (B): 198 mg/dL — ABNORMAL HIGH (ref <10)

## 2019-09-22 MED ORDER — NALOXONE HCL 0.4 MG/ML IJ SOLN
0.4000 mg | Freq: Once | INTRAMUSCULAR | Status: AC
  Administered 2019-09-22: 0.4 mg via INTRAVENOUS
  Filled 2019-09-22: qty 1
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
## 2019-09-22 NOTE — Discharge Instructions (Signed)
At this time there does not appear to be the presence of an emergent medical condition, however there is always the potential for conditions to change. Please read and follow the below instructions.  Please return to the Emergency Department immediately for any new or worsening symptoms or if your symptoms return. Please be sure to follow up with your Primary Care Provider within one week regarding your visit today; please call their office to schedule an appointment even if you are feeling better for a follow-up visit. Please use the resource guide given to you today to help find help with substance abuse.  Get help right away if: Your chest pain returns. You have a cough that gets worse, or you cough up blood. You have very bad (severe) pain in your belly (abdomen). You pass out (faint). You have either of these for no clear reason: Sudden chest discomfort. Sudden discomfort in your arms, back, neck, or jaw. You have shortness of breath at any time. You suddenly start to sweat, or your skin gets clammy. You feel sick to your stomach (nauseous). You throw up (vomit). You suddenly feel lightheaded or dizzy. You feel very weak or tired. Your heart starts to beat fast, or it feels like it is skipping beats. You have any new/concerning or worsening of symptoms  Please read the additional information packets attached to your discharge summary.  Do not take your medicine if  develop an itchy rash, swelling in your mouth or lips, or difficulty breathing; call 911 and seek immediate emergency medical attention if this occurs.  Note: Portions of this text may have been transcribed using voice recognition software. Every effort was made to ensure accuracy; however, inadvertent computerized transcription errors may still be present.

## 2019-09-22 NOTE — ED Triage Notes (Signed)
Pt BIB [facility] w/ c/o CP and bronchitis both ongoing for the past 6 mo, worsening over the past couple days, pt has been non compliant with HIV and psych meds over past 4 days. VS stable per EMS, BP 130/94, HR 72, 97% on RA, 97.3 temp, CBG 119. Pt admits to ETOH consumption, 2 40 oz and 2 20 oz bottles. Pt A&O, NAD noted.

## 2019-09-22 NOTE — ED Provider Notes (Signed)
Care handoff received from Fayrene Helper, PA-C at shift change please see previous provider note for full details.  " 12:37 PM Patient with history of alcohol abuse and polysubstance abuse brought here via EMS for evaluation of chest pain per EMS note.  Patient however is nonarousable, actively sleeping and snoring at this time.  He is protecting his airway.  He does have pinpoint pupil, will give Narcan.  Work-up initiated.  3:45 PM Patient is more rales, resting comfortably, requesting for food.  Will keep n.p.o.  Will obtain delta troponin.  Alcohol level is elevated at 198.  Patient will need to be monitored and allowed to metabolize to freedom.  Patient signed out to oncoming team. " Physical Exam  BP (!) 139/92   Pulse 65   Temp 98.2 F (36.8 C) (Oral)   Resp 15   Ht 6\' 3"  (1.905 m)   Wt 108.9 kg   SpO2 94%   BMI 30.00 kg/m   Physical Exam Constitutional:      General: He is not in acute distress.    Appearance: Normal appearance. He is well-developed. He is not ill-appearing or diaphoretic.  HENT:     Head: Normocephalic and atraumatic.     Right Ear: External ear normal.     Left Ear: External ear normal.     Nose: Nose normal.  Eyes:     General: Vision grossly intact. Gaze aligned appropriately.     Pupils: Pupils are equal, round, and reactive to light.  Neck:     Trachea: Trachea and phonation normal. No tracheal deviation.  Pulmonary:     Effort: Pulmonary effort is normal. No respiratory distress.  Abdominal:     General: There is no distension.     Palpations: Abdomen is soft.     Tenderness: There is no abdominal tenderness. There is no guarding or rebound.  Musculoskeletal:        General: Normal range of motion.     Cervical back: Normal range of motion.  Skin:    General: Skin is warm and dry.  Neurological:     Mental Status: He is alert.     GCS: GCS eye subscore is 4. GCS verbal subscore is 5. GCS motor subscore is 6.     Comments: Speech is clear  and goal oriented, follows commands Major Cranial nerves without deficit, no facial droop Moves extremities without ataxia, coordination intact  Psychiatric:        Behavior: Behavior normal.     ED Course/Procedures     Procedures  MDM  I have reviewed and interpreted the following labs.  UDS positive for cocaine and THC, consistence with history of polysubstance abuse Ethanol 198 consistent with his alcohol use and intoxication BMP shows no emergent electrolyte derangement or evidence of kidney injury CBC shows mild anemia of 12.3, history and consistent with chest pain anemia, no leukocytosis to suggest infection, suspect thrombocytopenia secondary to chronic alcohol abuse  High-sensitivity troponin initial 18, second troponin is 19, third troponin is 19.  Additionally troponin from 1 month ago was also 18, appears this is patient's baseline no acute elevations today suggestive of cardiac injury.  Chest x-ray:  IMPRESSION:  Low lung volumes with bronchovascular crowding. No evidence of  active disease.  I personally reviewed patient's chest x-ray and agree with radiologist interpretation.  EKG: Sinus rhythm Anteroseptal infarct, old Nonspecific T abnormalities, inferior leads No significant change since last tracing Confirmed by [phone]) on 09/22/2019 5:24:43 PM -------  Patient reassessed multiple times resting comfortably no acute distress.  He has eaten and drank in the emergency department without difficulty, is ambulated with nursing staff without difficulty or assistance.  I reevaluated patient, he is well-appearing and in no acute distress.  Patient reports he is feeling much better and denies any pain at this time, he is requesting a bus pass and discharge.  He appears clinically stable for discharge.  At this time there does not appear to be any evidence of an acute emergency medical condition and the patient appears stable for discharge with appropriate  outpatient follow up. Diagnosis was discussed with patient who verbalizes understanding of care plan and is agreeable to discharge. I have discussed return precautions with patient who verbalizes understanding. Patient encouraged to follow-up with their PCP. All questions answered.  Patient's case discussed with Dr. Ronnald Nian who agrees with plan to discharge with follow-up.   Note: Portions of this report may have been transcribed using voice recognition software. Every effort was made to ensure accuracy; however, inadvertent computerized transcription errors may still be present.   Deliah Boston, PA-C 09/22/19 2003    [address] DO 09/22/19 [id number]

## 2019-09-22 NOTE — ED Notes (Signed)
Patient provided with food and drink, able to ambulate around room on his own.

## 2019-09-22 NOTE — ED Notes (Signed)
Pt resting, alert to voice.

## 2019-10-10 ENCOUNTER — Other Ambulatory Visit: Payer: Self-pay

## 2019-10-10 ENCOUNTER — Emergency Department (HOSPITAL_COMMUNITY)
Admission: EM | Admit: 2019-10-10 | Discharge: 2019-10-11 | Disposition: A | Discharge status: Home / Self Care | Payer: Medicare HMO | Attending: Emergency Medicine | Admitting: Emergency Medicine

## 2019-10-10 ENCOUNTER — Encounter (HOSPITAL_COMMUNITY): Payer: Self-pay | Admitting: *Deleted

## 2019-10-10 ENCOUNTER — Ambulatory Visit: Payer: Medicare HMO | Admitting: Infectious Disease

## 2019-10-10 DIAGNOSIS — B20 Human immunodeficiency virus [HIV] disease: Secondary | ICD-10-CM | POA: Insufficient documentation

## 2019-10-10 DIAGNOSIS — B182 Chronic viral hepatitis C: Secondary | ICD-10-CM | POA: Insufficient documentation

## 2019-10-10 DIAGNOSIS — J449 Chronic obstructive pulmonary disease, unspecified: Secondary | ICD-10-CM | POA: Diagnosis not present

## 2019-10-10 DIAGNOSIS — Z79899 Other long term (current) drug therapy: Secondary | ICD-10-CM | POA: Diagnosis not present

## 2019-10-10 DIAGNOSIS — R002 Palpitations: Secondary | ICD-10-CM | POA: Insufficient documentation

## 2019-10-10 DIAGNOSIS — F17210 Nicotine dependence, cigarettes, uncomplicated: Secondary | ICD-10-CM | POA: Insufficient documentation

## 2019-10-10 NOTE — ED Triage Notes (Signed)
Pt arrives via [facility] with intermittent palpitations from his home. Denied pain or SOB. ETOH. En route HR 70, 95% RA, 114/74.

## 2019-10-11 ENCOUNTER — Encounter (HOSPITAL_COMMUNITY): Payer: Self-pay | Admitting: Emergency Medicine

## 2019-10-11 DIAGNOSIS — R002 Palpitations: Secondary | ICD-10-CM | POA: Diagnosis not present

## 2019-10-11 LAB — CBC WITH DIFFERENTIAL/PLATELET
Abs Immature Granulocytes: 0.02 10*3/uL (ref 0.00–0.07)
Basophils Absolute: 0 10*3/uL (ref 0.0–0.1)
Basophils Relative: 1 %
Eosinophils Absolute: 0.2 10*3/uL (ref 0.0–0.5)
Eosinophils Relative: 3 %
HCT: 45.4 % (ref 39.0–52.0)
Hemoglobin: 14.8 g/dL (ref 13.0–17.0)
Immature Granulocytes: 0 %
Lymphocytes Relative: 35 %
Lymphs Abs: 1.8 10*3/uL (ref 0.7–4.0)
MCH: 33 pg (ref 26.0–34.0)
MCHC: 32.6 g/dL (ref 30.0–36.0)
MCV: 101.1 fL — ABNORMAL HIGH (ref 80.0–100.0)
Monocytes Absolute: 0.5 10*3/uL (ref 0.1–1.0)
Monocytes Relative: 9 %
Neutro Abs: 2.6 10*3/uL (ref 1.7–7.7)
Neutrophils Relative %: 52 %
Platelets: 120 10*3/uL — ABNORMAL LOW (ref 150–400)
RBC: 4.49 MIL/uL (ref 4.22–5.81)
RDW: 13.4 % (ref 11.5–15.5)
WBC: 5.1 10*3/uL (ref 4.0–10.5)
nRBC: 0.4 % — ABNORMAL HIGH (ref 0.0–0.2)

## 2019-10-11 LAB — I-STAT CHEM 8, ED
BUN: 11 mg/dL (ref 8–23)
Calcium, Ion: 1.04 mmol/L — ABNORMAL LOW (ref 1.15–1.40)
Chloride: 101 mmol/L (ref 98–111)
Creatinine, Ser: 1.3 mg/dL — ABNORMAL HIGH (ref 0.61–1.24)
Glucose, Bld: 99 mg/dL (ref 70–99)
HCT: 49 % (ref 39.0–52.0)
Hemoglobin: 16.7 g/dL (ref 13.0–17.0)
Potassium: 4.2 mmol/L (ref 3.5–5.1)
Sodium: 138 mmol/L (ref 135–145)
TCO2: 23 mmol/L (ref 22–32)

## 2019-10-11 NOTE — ED Notes (Signed)
Discharge instructions discussed with pt. Pt verbalized understanding with no questions at this time. Pt ambulatory at time of discharge wheelchair out to lobby.

## 2019-10-11 NOTE — ED Notes (Signed)
Pt resting comfortably in bed. Respirations even, unlabored.
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
## 2019-10-15 ENCOUNTER — Other Ambulatory Visit: Payer: Self-pay

## 2019-10-15 DIAGNOSIS — B20 Human immunodeficiency virus [HIV] disease: Secondary | ICD-10-CM

## 2019-10-15 DIAGNOSIS — Z79899 Other long term (current) drug therapy: Secondary | ICD-10-CM

## 2019-10-15 DIAGNOSIS — Z113 Encounter for screening for infections with a predominantly sexual mode of transmission: Secondary | ICD-10-CM

## 2019-10-17 ENCOUNTER — Other Ambulatory Visit: Payer: Self-pay

## 2019-10-17 ENCOUNTER — Other Ambulatory Visit: Payer: Medicare HMO

## 2019-10-17 DIAGNOSIS — Z79899 Other long term (current) drug therapy: Secondary | ICD-10-CM

## 2019-10-17 DIAGNOSIS — Z113 Encounter for screening for infections with a predominantly sexual mode of transmission: Secondary | ICD-10-CM

## 2019-10-17 DIAGNOSIS — B20 Human immunodeficiency virus [HIV] disease: Secondary | ICD-10-CM

## 2019-10-18 LAB — T-HELPER CELL (CD4) - (RCID CLINIC ONLY)
CD4 % Helper T Cell: 14 % — ABNORMAL LOW (ref 33–65)
CD4 T Cell Abs: 105 /uL — ABNORMAL LOW (ref 400–1790)

## 2019-10-19 ENCOUNTER — Emergency Department (HOSPITAL_COMMUNITY)
Admission: EM | Admit: 2019-10-19 | Discharge: 2019-10-19 | Disposition: A | Discharge status: Home / Self Care | Payer: Medicare HMO | Attending: Emergency Medicine | Admitting: Emergency Medicine

## 2019-10-19 ENCOUNTER — Other Ambulatory Visit: Payer: Self-pay

## 2019-10-19 ENCOUNTER — Encounter (HOSPITAL_COMMUNITY): Payer: Self-pay | Admitting: Emergency Medicine

## 2019-10-19 DIAGNOSIS — Z79899 Other long term (current) drug therapy: Secondary | ICD-10-CM | POA: Insufficient documentation

## 2019-10-19 DIAGNOSIS — Y908 Blood alcohol level of 240 mg/100 ml or more: Secondary | ICD-10-CM | POA: Insufficient documentation

## 2019-10-19 DIAGNOSIS — F17210 Nicotine dependence, cigarettes, uncomplicated: Secondary | ICD-10-CM | POA: Diagnosis not present

## 2019-10-19 DIAGNOSIS — F10920 Alcohol use, unspecified with intoxication, uncomplicated: Secondary | ICD-10-CM | POA: Diagnosis present

## 2019-10-19 DIAGNOSIS — B20 Human immunodeficiency virus [HIV] disease: Secondary | ICD-10-CM | POA: Diagnosis not present

## 2019-10-19 DIAGNOSIS — B182 Chronic viral hepatitis C: Secondary | ICD-10-CM | POA: Insufficient documentation

## 2019-10-19 LAB — LIPID PANEL
Cholesterol: 94 mg/dL (ref <200)
HDL: 33 mg/dL — ABNORMAL LOW (ref >=40)
LDL Cholesterol (Calc): 48 mg/dL (calc)
Non-HDL Cholesterol (Calc): 61 mg/dL (calc) (ref <130)
Total CHOL/HDL Ratio: 2.8 (calc) (ref <5.0)
Triglycerides: 47 mg/dL (ref <150)

## 2019-10-19 LAB — CBC
HCT: 43.7 % (ref 39.0–52.0)
Hemoglobin: 14.1 g/dL (ref 13.0–17.0)
MCH: 32.9 pg (ref 26.0–34.0)
MCHC: 32.3 g/dL (ref 30.0–36.0)
MCV: 102.1 fL — ABNORMAL HIGH (ref 80.0–100.0)
Platelets: DECREASED 10*3/uL (ref 150–400)
RBC: 4.28 MIL/uL (ref 4.22–5.81)
RDW: 13.2 % (ref 11.5–15.5)
WBC: 5.3 10*3/uL (ref 4.0–10.5)
nRBC: 0 % (ref 0.0–0.2)

## 2019-10-19 LAB — COMPREHENSIVE METABOLIC PANEL
AG Ratio: 0.7 (calc) — ABNORMAL LOW (ref 1.0–2.5)
ALT: 35 U/L (ref 9–46)
ALT: 47 U/L — ABNORMAL HIGH (ref 0–44)
AST: 70 U/L — ABNORMAL HIGH (ref 10–35)
AST: 99 U/L — ABNORMAL HIGH (ref 15–41)
Albumin: 3.4 g/dL — ABNORMAL LOW (ref 3.6–5.1)
Albumin: 3.1 g/dL — ABNORMAL LOW (ref 3.5–5.0)
Alkaline Phosphatase: 75 U/L (ref 38–126)
Alkaline phosphatase (APISO): 74 U/L (ref 35–144)
Anion gap: 11 (ref 5–15)
BUN: 11 mg/dL (ref 7–25)
BUN: 7 mg/dL — ABNORMAL LOW (ref 8–23)
CO2: 25 mmol/L (ref 20–32)
CO2: 25 mmol/L (ref 22–32)
Calcium: 9 mg/dL (ref 8.6–10.3)
Calcium: 9.6 mg/dL (ref 8.9–10.3)
Chloride: 106 mmol/L (ref 98–110)
Chloride: 100 mmol/L (ref 98–111)
Creat: 1.02 mg/dL (ref 0.70–1.25)
Creatinine, Ser: 0.9 mg/dL (ref 0.61–1.24)
GFR calc Af Amer: >60 mL/min (ref >60)
GFR calc non Af Amer: >60 mL/min (ref >60)
Globulin: 4.6 g/dL (calc) — ABNORMAL HIGH (ref 1.9–3.7)
Glucose, Bld: 119 mg/dL — ABNORMAL HIGH (ref 65–99)
Glucose, Bld: 112 mg/dL — ABNORMAL HIGH (ref 70–99)
Potassium: 4.3 mmol/L (ref 3.5–5.3)
Potassium: 3.9 mmol/L (ref 3.5–5.1)
Sodium: 137 mmol/L (ref 135–146)
Sodium: 136 mmol/L (ref 135–145)
Total Bilirubin: 0.9 mg/dL (ref 0.2–1.2)
Total Bilirubin: 1.1 mg/dL (ref 0.3–1.2)
Total Protein: 8 g/dL (ref 6.1–8.1)
Total Protein: 9 g/dL — ABNORMAL HIGH (ref 6.5–8.1)

## 2019-10-19 LAB — CBC WITH DIFFERENTIAL/PLATELET
Absolute Monocytes: 301 cells/uL (ref 200–950)
Basophils Absolute: 9 cells/uL (ref 0–200)
Basophils Relative: 0.2 %
Eosinophils Absolute: 99 cells/uL (ref 15–500)
Eosinophils Relative: 2.3 %
HCT: 40.9 % (ref 38.5–50.0)
Hemoglobin: 13.7 g/dL (ref 13.2–17.1)
Lymphs Abs: 757 cells/uL — ABNORMAL LOW (ref 850–3900)
MCH: 33.2 pg — ABNORMAL HIGH (ref 27.0–33.0)
MCHC: 33.5 g/dL (ref 32.0–36.0)
MCV: 99 fL (ref 80.0–100.0)
MPV: 10 fL (ref 7.5–12.5)
Monocytes Relative: 7 %
Neutro Abs: 3135 cells/uL (ref 1500–7800)
Neutrophils Relative %: 72.9 %
Platelets: 97 10*3/uL — ABNORMAL LOW (ref 140–400)
RBC: 4.13 10*6/uL — ABNORMAL LOW (ref 4.20–5.80)
RDW: 11.8 % (ref 11.0–15.0)
Total Lymphocyte: 17.6 %
WBC: 4.3 10*3/uL (ref 3.8–10.8)

## 2019-10-19 LAB — HIV-1 RNA QUANT-NO REFLEX-BLD
HIV 1 RNA Quant: <20 copies/mL — AB
HIV-1 RNA Quant, Log: <1.3 Log copies/mL — AB

## 2019-10-19 LAB — ETHANOL: Alcohol, Ethyl (B): 245 mg/dL — ABNORMAL HIGH (ref <10)

## 2019-10-19 LAB — RPR: RPR Ser Ql: NONREACTIVE

## 2019-10-19 NOTE — ED Triage Notes (Signed)
Pt to triage via [facility].  EMS was called because pt lying in front of [employer].  Pt denies complaints.  + ETOH.

## 2019-10-19 NOTE — ED Notes (Addendum)
Pt provided discharge paperwork and was escorted out of the building. Pt ambulated to lobby without issue

## 2019-10-19 NOTE — ED Notes (Signed)
Pt stood up, called for a urinal which was at bedside, pulled out his penis and urinated in the hallway without remorse or care.

## 2019-10-19 NOTE — ED Notes (Signed)
This tech attempted x2 to get blood. Tech was unsuccessful. Marisue Ivan, RN notified.

## 2019-10-19 NOTE — Discharge Instructions (Signed)
It was our pleasure to provide your ER care today - we hope that you feel better.  Avoid alcohol use. Follow up with AA, and use resource guide provided for additional community resources.   Also follow up with primary care doctor in the coming week.   Return to ER if worse, new symptoms, new or severe pain, chest pain, trouble breathing, or other concern.
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
## 2019-10-19 NOTE — ED Notes (Signed)
Unable to collect UA. Pt urinated on floor missing urinal

## 2019-11-01 ENCOUNTER — Other Ambulatory Visit: Payer: Self-pay

## 2019-11-01 ENCOUNTER — Encounter (HOSPITAL_COMMUNITY): Payer: Self-pay

## 2019-11-01 DIAGNOSIS — Z590 Homelessness: Secondary | ICD-10-CM | POA: Diagnosis not present

## 2019-11-01 DIAGNOSIS — F1010 Alcohol abuse, uncomplicated: Secondary | ICD-10-CM | POA: Insufficient documentation

## 2019-11-01 DIAGNOSIS — F17210 Nicotine dependence, cigarettes, uncomplicated: Secondary | ICD-10-CM | POA: Insufficient documentation

## 2019-11-01 DIAGNOSIS — J449 Chronic obstructive pulmonary disease, unspecified: Secondary | ICD-10-CM | POA: Insufficient documentation

## 2019-11-01 NOTE — ED Triage Notes (Addendum)
Pt BIB [facility] from some bushes. He was found sleeping near a 40oz. ETOH. Ambulatory without assistance. Requesting a urinal, but then walked to the bathroom.

## 2019-11-02 ENCOUNTER — Emergency Department (HOSPITAL_COMMUNITY)
Admission: EM | Admit: 2019-11-02 | Discharge: 2019-11-02 | Disposition: A | Discharge status: Home / Self Care | Payer: Medicare HMO | Attending: Emergency Medicine | Admitting: Emergency Medicine

## 2019-11-02 DIAGNOSIS — Z5900 Homelessness unspecified: Secondary | ICD-10-CM
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
## 2019-11-02 NOTE — Discharge Instructions (Addendum)
Follow up with your doctor on 6/14 as scheduled.

## 2019-11-04 ENCOUNTER — Ambulatory Visit: Payer: Medicare HMO | Admitting: Infectious Disease

## 2019-11-14 ENCOUNTER — Emergency Department (HOSPITAL_COMMUNITY)
Admission: EM | Admit: 2019-11-14 | Discharge: 2019-11-15 | Disposition: A | Discharge status: Home / Self Care | Payer: Medicare HMO | Attending: Emergency Medicine | Admitting: Emergency Medicine

## 2019-11-14 ENCOUNTER — Other Ambulatory Visit: Payer: Self-pay

## 2019-11-14 ENCOUNTER — Encounter (HOSPITAL_COMMUNITY): Payer: Self-pay | Admitting: *Deleted

## 2019-11-14 DIAGNOSIS — R440 Auditory hallucinations: Secondary | ICD-10-CM | POA: Insufficient documentation

## 2019-11-14 DIAGNOSIS — F10129 Alcohol abuse with intoxication, unspecified: Secondary | ICD-10-CM | POA: Insufficient documentation

## 2019-11-14 DIAGNOSIS — J449 Chronic obstructive pulmonary disease, unspecified: Secondary | ICD-10-CM | POA: Diagnosis not present

## 2019-11-14 DIAGNOSIS — F1290 Cannabis use, unspecified, uncomplicated: Secondary | ICD-10-CM | POA: Diagnosis not present

## 2019-11-14 DIAGNOSIS — F10920 Alcohol use, unspecified with intoxication, uncomplicated: Secondary | ICD-10-CM

## 2019-11-14 DIAGNOSIS — R45851 Suicidal ideations: Secondary | ICD-10-CM | POA: Diagnosis not present

## 2019-11-14 DIAGNOSIS — F1014 Alcohol abuse with alcohol-induced mood disorder: Secondary | ICD-10-CM | POA: Diagnosis present

## 2019-11-14 DIAGNOSIS — I10 Essential (primary) hypertension: Secondary | ICD-10-CM | POA: Insufficient documentation

## 2019-11-14 DIAGNOSIS — Z20822 Contact with and (suspected) exposure to covid-19: Secondary | ICD-10-CM | POA: Insufficient documentation

## 2019-11-14 DIAGNOSIS — F17210 Nicotine dependence, cigarettes, uncomplicated: Secondary | ICD-10-CM | POA: Diagnosis not present

## 2019-11-14 LAB — COMPREHENSIVE METABOLIC PANEL
ALT: 33 U/L (ref 0–44)
AST: 74 U/L — ABNORMAL HIGH (ref 15–41)
Albumin: 3.2 g/dL — ABNORMAL LOW (ref 3.5–5.0)
Alkaline Phosphatase: 68 U/L (ref 38–126)
Anion gap: 6 (ref 5–15)
BUN: 10 mg/dL (ref 8–23)
CO2: 25 mmol/L (ref 22–32)
Calcium: 8.5 mg/dL — ABNORMAL LOW (ref 8.9–10.3)
Chloride: 103 mmol/L (ref 98–111)
Creatinine, Ser: 1 mg/dL (ref 0.61–1.24)
GFR calc Af Amer: >60 mL/min (ref >60)
GFR calc non Af Amer: >60 mL/min (ref >60)
Glucose, Bld: 93 mg/dL (ref 70–99)
Potassium: 3.9 mmol/L (ref 3.5–5.1)
Sodium: 134 mmol/L — ABNORMAL LOW (ref 135–145)
Total Bilirubin: 0.9 mg/dL (ref 0.3–1.2)
Total Protein: 8.4 g/dL — ABNORMAL HIGH (ref 6.5–8.1)

## 2019-11-14 LAB — CBC
HCT: 41.3 % (ref 39.0–52.0)
Hemoglobin: 13.1 g/dL (ref 13.0–17.0)
MCH: 32.4 pg (ref 26.0–34.0)
MCHC: 31.7 g/dL (ref 30.0–36.0)
MCV: 102.2 fL — ABNORMAL HIGH (ref 80.0–100.0)
Platelets: 106 10*3/uL — ABNORMAL LOW (ref 150–400)
RBC: 4.04 MIL/uL — ABNORMAL LOW (ref 4.22–5.81)
RDW: 12.8 % (ref 11.5–15.5)
WBC: 5.1 10*3/uL (ref 4.0–10.5)
nRBC: 0 % (ref 0.0–0.2)

## 2019-11-14 LAB — RAPID URINE DRUG SCREEN, HOSP PERFORMED
Amphetamines: NOT DETECTED
Barbiturates: NOT DETECTED
Benzodiazepines: NOT DETECTED
Cocaine: NOT DETECTED
Opiates: NOT DETECTED
Tetrahydrocannabinol: NOT DETECTED

## 2019-11-14 LAB — ACETAMINOPHEN LEVEL: Acetaminophen (Tylenol), Serum: <10 ug/mL — ABNORMAL LOW (ref 10–30)

## 2019-11-14 LAB — SALICYLATE LEVEL: Salicylate Lvl: <7 mg/dL — ABNORMAL LOW (ref 7.0–30.0)

## 2019-11-14 LAB — ETHANOL: Alcohol, Ethyl (B): 142 mg/dL — ABNORMAL HIGH (ref <10)
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
## 2019-11-14 NOTE — ED Triage Notes (Addendum)
BIB EMS due to ETOH, states he is suicidal after drinking "2 40's" found lying on street. Pt states he wants to kill self with overdosing, states he threw his drugs away. 138/100-78-96%- 18 CBG 130

## 2019-11-15 DIAGNOSIS — F10129 Alcohol abuse with intoxication, unspecified: Secondary | ICD-10-CM | POA: Diagnosis not present

## 2019-11-15 LAB — SARS CORONAVIRUS 2 BY RT PCR (HOSPITAL ORDER, PERFORMED IN ~~LOC~~ HOSPITAL LAB): SARS Coronavirus 2: NEGATIVE

## 2019-11-15 MED ORDER — LORAZEPAM 2 MG/ML IJ SOLN: 0.0000 mg | Freq: Two times a day (BID) | INTRAMUSCULAR | Status: DC

## 2019-11-15 MED ORDER — LORAZEPAM 1 MG PO TABS: 0.0000 mg | ORAL_TABLET | Freq: Four times a day (QID) | ORAL | Status: DC

## 2019-11-15 MED ORDER — ONDANSETRON HCL 4 MG PO TABS: 4.0000 mg | ORAL_TABLET | Freq: Three times a day (TID) | ORAL | Status: DC | PRN

## 2019-11-15 MED ORDER — THIAMINE HCL 100 MG/ML IJ SOLN: 100.0000 mg | Freq: Every day | INTRAMUSCULAR | Status: DC

## 2019-11-15 MED ORDER — RISPERIDONE 2 MG PO TABS
3.0000 mg | ORAL_TABLET | Freq: Every day | ORAL | Status: DC
  Administered 2019-11-15: 3 mg via ORAL
  Filled 2019-11-15: qty 1

## 2019-11-15 MED ORDER — BICTEGRAVIR-EMTRICITAB-TENOFOV 50-200-25 MG PO TABS
1.0000 | ORAL_TABLET | Freq: Every day | ORAL | Status: DC
  Administered 2019-11-15: 1 via ORAL
  Filled 2019-11-15: qty 1

## 2019-11-15 MED ORDER — GABAPENTIN 100 MG PO CAPS
100.0000 mg | ORAL_CAPSULE | Freq: Three times a day (TID) | ORAL | Status: DC
  Administered 2019-11-15 (×2): 100 mg via ORAL
  Filled 2019-11-15 (×3): qty 1

## 2019-11-15 MED ORDER — SERTRALINE HCL 50 MG PO TABS
100.0000 mg | ORAL_TABLET | Freq: Every day | ORAL | Status: DC
  Administered 2019-11-15: 100 mg via ORAL
  Filled 2019-11-15: qty 2

## 2019-11-15 MED ORDER — LORAZEPAM 2 MG/ML IJ SOLN: 0.0000 mg | Freq: Four times a day (QID) | INTRAMUSCULAR | Status: DC

## 2019-11-15 MED ORDER — LORAZEPAM 1 MG PO TABS: 0.0000 mg | ORAL_TABLET | Freq: Two times a day (BID) | ORAL | Status: DC

## 2019-11-15 MED ORDER — NICOTINE 21 MG/24HR TD PT24: 21.0000 mg | MEDICATED_PATCH | Freq: Every day | TRANSDERMAL | Status: DC | PRN

## 2019-11-15 MED ORDER — THIAMINE HCL 100 MG PO TABS
100.0000 mg | ORAL_TABLET | Freq: Every day | ORAL | Status: DC
  Administered 2019-11-15: 100 mg via ORAL
  Filled 2019-11-15: qty 1

## 2019-11-15 MED ORDER — SULFAMETHOXAZOLE-TRIMETHOPRIM 800-160 MG PO TABS
1.0000 | ORAL_TABLET | Freq: Every day | ORAL | Status: DC
  Administered 2019-11-15: 1 via ORAL
  Filled 2019-11-15: qty 1

## 2019-11-15 NOTE — ED Notes (Signed)
TTS clinician attempted assessment. Per Areta Haber, NP, patient is asleep and unable to arouse/participate in assessment. TTS will attempt at later time.
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
## 2019-11-15 NOTE — ED Notes (Signed)
Arlys John has faxed information for [employer].

## 2019-11-15 NOTE — ED Provider Notes (Signed)
10:55 AM Patient pending TTS eval. Medically cleared. Bactrim DS ordered for PCP prophylaxis.   BP (!) 143/92 (BP Location: Left Arm)   Pulse 79   Temp 98.5 F (36.9 C) (Oral)   Resp 18   Ht 6' (1.829 m)   Wt 108.9 kg   SpO2 96%   BMI 32.56 kg/m      Renne Crigler, PA-C 11/15/19 1151    Alvira Monday, MD 11/16/19 [phone]

## 2019-11-15 NOTE — ED Notes (Signed)
Pt DC d off unit to home per provider. Pt alert, calm, cooperative, no s. s of distress. DC information given to pt. Belongings given to pt. Pt ambulatory off unit, escorted by [facility]. Pt given a bus pass and using bus for transportation.

## 2019-11-19 ENCOUNTER — Other Ambulatory Visit: Payer: Self-pay | Admitting: Infectious Disease

## 2019-11-19 DIAGNOSIS — M5431 Sciatica, right side: Secondary | ICD-10-CM

## 2019-11-23 ENCOUNTER — Emergency Department (HOSPITAL_COMMUNITY): Payer: Medicare HMO

## 2019-11-23 ENCOUNTER — Emergency Department (HOSPITAL_COMMUNITY)
Admission: EM | Admit: 2019-11-23 | Discharge: 2019-11-23 | Disposition: A | Discharge status: Home / Self Care | Payer: Medicare HMO | Attending: Emergency Medicine | Admitting: Emergency Medicine

## 2019-11-23 ENCOUNTER — Other Ambulatory Visit: Payer: Self-pay

## 2019-11-23 DIAGNOSIS — Z79899 Other long term (current) drug therapy: Secondary | ICD-10-CM | POA: Insufficient documentation

## 2019-11-23 DIAGNOSIS — I10 Essential (primary) hypertension: Secondary | ICD-10-CM | POA: Diagnosis not present

## 2019-11-23 DIAGNOSIS — J45909 Unspecified asthma, uncomplicated: Secondary | ICD-10-CM | POA: Insufficient documentation

## 2019-11-23 DIAGNOSIS — R05 Cough: Secondary | ICD-10-CM | POA: Insufficient documentation

## 2019-11-23 DIAGNOSIS — J449 Chronic obstructive pulmonary disease, unspecified: Secondary | ICD-10-CM | POA: Diagnosis not present

## 2019-11-23 DIAGNOSIS — Z21 Asymptomatic human immunodeficiency virus [HIV] infection status: Secondary | ICD-10-CM | POA: Insufficient documentation

## 2019-11-23 DIAGNOSIS — F17210 Nicotine dependence, cigarettes, uncomplicated: Secondary | ICD-10-CM | POA: Diagnosis not present

## 2019-11-23 DIAGNOSIS — R42 Dizziness and giddiness: Secondary | ICD-10-CM

## 2019-11-23 DIAGNOSIS — R059 Cough, unspecified: Secondary | ICD-10-CM

## 2019-11-23 LAB — CBC WITH DIFFERENTIAL/PLATELET
Abs Immature Granulocytes: 0.01 10*3/uL (ref 0.00–0.07)
Basophils Absolute: 0 10*3/uL (ref 0.0–0.1)
Basophils Relative: 1 %
Eosinophils Absolute: 0.2 10*3/uL (ref 0.0–0.5)
Eosinophils Relative: 4 %
HCT: 40.2 % (ref 39.0–52.0)
Hemoglobin: 12.9 g/dL — ABNORMAL LOW (ref 13.0–17.0)
Immature Granulocytes: 0 %
Lymphocytes Relative: 14 %
Lymphs Abs: 0.6 10*3/uL — ABNORMAL LOW (ref 0.7–4.0)
MCH: 32.9 pg (ref 26.0–34.0)
MCHC: 32.1 g/dL (ref 30.0–36.0)
MCV: 102.6 fL — ABNORMAL HIGH (ref 80.0–100.0)
Monocytes Absolute: 0.4 10*3/uL (ref 0.1–1.0)
Monocytes Relative: 8 %
Neutro Abs: 3.2 10*3/uL (ref 1.7–7.7)
Neutrophils Relative %: 73 %
Platelets: 107 10*3/uL — ABNORMAL LOW (ref 150–400)
RBC: 3.92 MIL/uL — ABNORMAL LOW (ref 4.22–5.81)
RDW: 13.1 % (ref 11.5–15.5)
WBC: 4.3 10*3/uL (ref 4.0–10.5)
nRBC: 0 % (ref 0.0–0.2)

## 2019-11-23 LAB — COMPREHENSIVE METABOLIC PANEL
ALT: 32 U/L (ref 0–44)
AST: 73 U/L — ABNORMAL HIGH (ref 15–41)
Albumin: 3 g/dL — ABNORMAL LOW (ref 3.5–5.0)
Alkaline Phosphatase: 67 U/L (ref 38–126)
Anion gap: 5 (ref 5–15)
BUN: 10 mg/dL (ref 8–23)
CO2: 26 mmol/L (ref 22–32)
Calcium: 8.7 mg/dL — ABNORMAL LOW (ref 8.9–10.3)
Chloride: 106 mmol/L (ref 98–111)
Creatinine, Ser: 0.77 mg/dL (ref 0.61–1.24)
GFR calc Af Amer: >60 mL/min (ref >60)
GFR calc non Af Amer: >60 mL/min (ref >60)
Glucose, Bld: 114 mg/dL — ABNORMAL HIGH (ref 70–99)
Potassium: 5.2 mmol/L — ABNORMAL HIGH (ref 3.5–5.1)
Sodium: 137 mmol/L (ref 135–145)
Total Bilirubin: 0.9 mg/dL (ref 0.3–1.2)
Total Protein: 8.1 g/dL (ref 6.5–8.1)

## 2019-11-23 NOTE — ED Notes (Signed)
Pt requesting to leave.  EDP Freida Busman made aware.
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
## 2019-11-23 NOTE — ED Notes (Signed)
Pt provided with bus pass, per his request.  No other needs or concerns expressed.  Discharge paperwork reviewed with pt.  Pt ambulatory at time of discharge.

## 2019-11-23 NOTE — ED Notes (Signed)
Pt ambulatory to bathroom, no assistance needed.  

## 2019-11-23 NOTE — ED Triage Notes (Signed)
Pt BIBA from home.   Per EMS- Pt c/o cough, chest pain with coughing, headache x1 week.

## 2019-12-16 ENCOUNTER — Other Ambulatory Visit: Payer: Self-pay | Admitting: Infectious Disease

## 2019-12-16 DIAGNOSIS — M5431 Sciatica, right side: Secondary | ICD-10-CM

## 2019-12-19 ENCOUNTER — Telehealth: Payer: Self-pay

## 2019-12-19 NOTE — Telephone Encounter (Signed)
Called patient to get him scheduled with pharmacy, since patient has missed last few appointments with the provider, left a voicemail to call us back and get that scheduled.

## 2020-01-20 MED FILL — Gabapentin Cap 100 MG: ORAL | Qty: 100 | Status: AC

## 2020-01-20 MED FILL — Sulfamethoxazole-Trimethoprim Tab 800-160 MG: ORAL | Qty: 1 | Status: AC

## 2020-01-20 MED FILL — Sertraline HCl Tab 50 MG: ORAL | Qty: 100 | Status: AC

## 2020-01-20 MED FILL — Thiamine Mononitrate Tab 100 MG: ORAL | Qty: 1 | Status: AC

## 2020-02-05 ENCOUNTER — Emergency Department (HOSPITAL_COMMUNITY): Payer: No Typology Code available for payment source

## 2020-02-05 ENCOUNTER — Emergency Department (HOSPITAL_COMMUNITY)
Admission: EM | Admit: 2020-02-05 | Discharge: 2020-02-05 | Disposition: A | Discharge status: Home / Self Care | Payer: No Typology Code available for payment source | Attending: Emergency Medicine | Admitting: Emergency Medicine

## 2020-02-05 ENCOUNTER — Other Ambulatory Visit: Payer: Self-pay

## 2020-02-05 DIAGNOSIS — I10 Essential (primary) hypertension: Secondary | ICD-10-CM | POA: Insufficient documentation

## 2020-02-05 DIAGNOSIS — B20 Human immunodeficiency virus [HIV] disease: Secondary | ICD-10-CM | POA: Insufficient documentation

## 2020-02-05 DIAGNOSIS — F17210 Nicotine dependence, cigarettes, uncomplicated: Secondary | ICD-10-CM | POA: Diagnosis not present

## 2020-02-05 DIAGNOSIS — R0789 Other chest pain: Secondary | ICD-10-CM | POA: Diagnosis not present

## 2020-02-05 DIAGNOSIS — J45909 Unspecified asthma, uncomplicated: Secondary | ICD-10-CM | POA: Insufficient documentation

## 2020-02-05 DIAGNOSIS — J449 Chronic obstructive pulmonary disease, unspecified: Secondary | ICD-10-CM | POA: Insufficient documentation

## 2020-02-05 DIAGNOSIS — R609 Edema, unspecified: Secondary | ICD-10-CM | POA: Diagnosis not present

## 2020-02-05 DIAGNOSIS — Z79899 Other long term (current) drug therapy: Secondary | ICD-10-CM | POA: Insufficient documentation

## 2020-02-05 DIAGNOSIS — R079 Chest pain, unspecified: Secondary | ICD-10-CM

## 2020-02-05 LAB — BASIC METABOLIC PANEL
Anion gap: 8 (ref 5–15)
BUN: 15 mg/dL (ref 8–23)
CO2: 24 mmol/L (ref 22–32)
Calcium: 9.4 mg/dL (ref 8.9–10.3)
Chloride: 103 mmol/L (ref 98–111)
Creatinine, Ser: 0.98 mg/dL (ref 0.61–1.24)
GFR calc Af Amer: >60 mL/min (ref >60)
GFR calc non Af Amer: >60 mL/min (ref >60)
Glucose, Bld: 113 mg/dL — ABNORMAL HIGH (ref 70–99)
Potassium: 3.9 mmol/L (ref 3.5–5.1)
Sodium: 135 mmol/L (ref 135–145)

## 2020-02-05 LAB — CBC
HCT: 40 % (ref 39.0–52.0)
Hemoglobin: 12.6 g/dL — ABNORMAL LOW (ref 13.0–17.0)
MCH: 31.7 pg (ref 26.0–34.0)
MCHC: 31.5 g/dL (ref 30.0–36.0)
MCV: 100.5 fL — ABNORMAL HIGH (ref 80.0–100.0)
Platelets: 93 10*3/uL — ABNORMAL LOW (ref 150–400)
RBC: 3.98 MIL/uL — ABNORMAL LOW (ref 4.22–5.81)
RDW: 13.5 % (ref 11.5–15.5)
WBC: 4.3 10*3/uL (ref 4.0–10.5)
nRBC: 0 % (ref 0.0–0.2)

## 2020-02-05 LAB — TROPONIN I (HIGH SENSITIVITY)
Troponin I (High Sensitivity): 11 ng/L (ref <18)
Troponin I (High Sensitivity): 11 ng/L (ref <18)

## 2020-02-05 MED ORDER — FUROSEMIDE 20 MG PO TABS: 20.0000 mg | ORAL_TABLET | Freq: Every day | ORAL | 0 refills | Status: AC

## 2020-02-05 NOTE — Discharge Instructions (Signed)
Take the Lasix to help with your leg swelling.  Continue to try to quit smoking like you are doing.  Follow-up with a cardiologist.  I will give you the name of one in the [place] area or you can consider seeing one at the [place].  Return as needed for worsening symptoms.

## 2020-02-05 NOTE — ED Triage Notes (Signed)
Pt to ED for eval of generalized chest pain with some radiation to R arm since yesterday. Reports pain is worse with activity.
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
## 2020-02-05 NOTE — ED Notes (Signed)
The pt reports that he has to get home to children  Left with friend

## 2020-02-12 ENCOUNTER — Ambulatory Visit (INDEPENDENT_AMBULATORY_CARE_PROVIDER_SITE_OTHER): Payer: Medicare HMO | Admitting: Podiatry

## 2020-02-12 ENCOUNTER — Other Ambulatory Visit: Payer: Self-pay

## 2020-02-12 VITALS — BP 134/76 | HR 90 | Temp 98.0°F

## 2020-02-12 DIAGNOSIS — M79675 Pain in left toe(s): Secondary | ICD-10-CM

## 2020-02-12 DIAGNOSIS — B351 Tinea unguium: Secondary | ICD-10-CM

## 2020-02-12 DIAGNOSIS — G63 Polyneuropathy in diseases classified elsewhere: Secondary | ICD-10-CM

## 2020-02-12 DIAGNOSIS — B20 Human immunodeficiency virus [HIV] disease: Secondary | ICD-10-CM

## 2020-02-12 DIAGNOSIS — Q828 Other specified congenital malformations of skin: Secondary | ICD-10-CM

## 2020-02-12 DIAGNOSIS — M79674 Pain in right toe(s): Secondary | ICD-10-CM

## 2020-02-13 ENCOUNTER — Encounter: Payer: Self-pay | Admitting: Podiatry

## 2020-02-13 NOTE — Progress Notes (Signed)
  Subjective:  Patient ID: [name], male    DOB: 1956/03/25,  MRN: [id number]  Chief Complaint  Patient presents with  . Nail Problem    nail trim, toe nails discolored  . Callouses    5th toe painful   64 y.o. male returns for the above complaint.  Patient presents with complaint of thickened elongated dystrophic toenails x10.  Patient not able to take care of it himself.  He would like to have them debrided down.  He also has secondary complaint of hyperkeratotic lesion to bilateral submetatarsal 2 that he would also like to have them debrided down.  He denies any other acute complaints.  He states is painful to walk on.  Objective:   Vitals:   02/12/20 1111  BP: 134/76  Pulse: 90  Temp: 98 F (36.7 C)   Podiatric Exam: Vascular: dorsalis pedis and posterior tibial pulses are palpable bilateral. Capillary return is immediate. Temperature gradient is WNL. Skin turgor WNL  Sensorium: Normal Semmes Weinstein monofilament test. Normal tactile sensation bilaterally. Nail Exam: Pt has thick disfigured discolored nails with subungual debris noted bilateral entire nail hallux through fifth toenails.  Pain on palpation to the nails. Ulcer Exam: There is no evidence of ulcer or pre-ulcerative changes or infection. Orthopedic Exam: Muscle tone and strength are WNL. No limitations in general ROM. No crepitus or effusions noted. HAV  B/L.  Hammer toes 2-5  B/L. Skin: Porokeratosis noted to bilateral submetatarsal 2 with hyperkeratotic lesion.  Pain on palpation. No infection or ulcers    Assessment & Plan:   1. Pain due to onychomycosis of toenails of both feet   2. 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 Porokeratosis     Patient was evaluated and treated and all questions answered.  Bilateral submetatarsal 2 hyperkeratotic lesion/porokeratosis -I explained patient the etiology of porokeratosis and various treatment options were extensively discussed with the patient.  I believe  patient will benefit from aggressive debridement of the lesion with excision of the central nucleated core.  Patient agrees with the plan would like to proceed with that. -Using chisel blade and handle, the lesion was aggressively debrided down to healthy striated tissue followed by excision of central nucleated core standard technique.  No complication noted.  No pinpoint bleeding noted.  Onychomycosis with pain  -Nails palliatively debrided as below. -Educated on self-care  Procedure: Nail Debridement Rationale: pain  Type of Debridement: manual, sharp debridement. Instrumentation: Nail nipper, rotary burr. Number of Nails: 10  Procedures and Treatment: Consent by patient was obtained for treatment procedures. The patient understood the discussion of treatment and procedures well. All questions were answered thoroughly reviewed. Debridement of mycotic and hypertrophic toenails, 1 through 5 bilateral and clearing of subungual debris. No ulceration, no infection noted.  Return Visit-Office Procedure: Patient instructed to return to the office for a follow up visit 3 months for continued evaluation and treatment.  Nicholes Rough, DPM    No follow-ups on file.

## 2020-05-13 ENCOUNTER — Ambulatory Visit: Payer: Medicare HMO | Admitting: Podiatry

## 2020-06-23 DEATH — deceased
